# Patient Record
Sex: Female | Born: 1960
Health system: Southern US, Community
[De-identification: ages and names within clinical notes are randomized; demographics above are authoritative.]

## PROBLEM LIST (undated history)

## (undated) DIAGNOSIS — F419 Anxiety disorder, unspecified: Secondary | ICD-10-CM

## (undated) DIAGNOSIS — M889 Osteitis deformans of unspecified bone: Secondary | ICD-10-CM

## (undated) DIAGNOSIS — T4145XA Adverse effect of unspecified anesthetic, initial encounter: Secondary | ICD-10-CM

## (undated) DIAGNOSIS — F32A Depression, unspecified: Secondary | ICD-10-CM

## (undated) DIAGNOSIS — T8859XA Other complications of anesthesia, initial encounter: Secondary | ICD-10-CM

## (undated) DIAGNOSIS — Z973 Presence of spectacles and contact lenses: Secondary | ICD-10-CM

## (undated) DIAGNOSIS — K219 Gastro-esophageal reflux disease without esophagitis: Secondary | ICD-10-CM

## (undated) DIAGNOSIS — M1990 Unspecified osteoarthritis, unspecified site: Secondary | ICD-10-CM

## (undated) DIAGNOSIS — F329 Major depressive disorder, single episode, unspecified: Secondary | ICD-10-CM

## (undated) DIAGNOSIS — E785 Hyperlipidemia, unspecified: Secondary | ICD-10-CM

## (undated) HISTORY — PX: BREAST SURGERY: SHX581

## (undated) HISTORY — PX: BREAST CYST EXCISION: SHX579

## (undated) HISTORY — PX: JOINT REPLACEMENT: SHX530

## (undated) HISTORY — DX: Anxiety disorder, unspecified: F41.9

## (undated) HISTORY — DX: Depression, unspecified: F32.A

## (undated) HISTORY — PX: SPINE SURGERY: SHX786

---
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
## 1984-10-01 HISTORY — PX: DILATION AND CURETTAGE OF UTERUS: SHX78

## 1987-10-02 HISTORY — PX: TUBAL LIGATION: SHX77

## 2004-08-22 ENCOUNTER — Other Ambulatory Visit: Admission: RE | Admit: 2004-08-22 | Discharge: 2004-08-22 | Payer: Self-pay | Admitting: Family Medicine

## 2004-08-22 ENCOUNTER — Encounter (INDEPENDENT_AMBULATORY_CARE_PROVIDER_SITE_OTHER): Payer: Self-pay | Admitting: Specialist

## 2004-08-22 ENCOUNTER — Ambulatory Visit: Payer: Self-pay | Admitting: Family Medicine

## 2004-09-05 ENCOUNTER — Ambulatory Visit: Payer: Self-pay | Admitting: Obstetrics and Gynecology

## 2005-10-22 ENCOUNTER — Ambulatory Visit: Payer: Self-pay | Admitting: Family Medicine

## 2005-10-31 ENCOUNTER — Ambulatory Visit: Payer: Self-pay | Admitting: Family Medicine

## 2005-11-13 ENCOUNTER — Ambulatory Visit (HOSPITAL_COMMUNITY): Admission: RE | Admit: 2005-11-13 | Discharge: 2005-11-13 | Payer: Self-pay | Admitting: Orthopaedic Surgery

## 2008-10-11 ENCOUNTER — Emergency Department (HOSPITAL_COMMUNITY): Admission: EM | Admit: 2008-10-11 | Discharge: 2008-10-11 | Payer: Self-pay | Admitting: Emergency Medicine

## 2010-02-17 ENCOUNTER — Ambulatory Visit (HOSPITAL_COMMUNITY): Admission: RE | Admit: 2010-02-17 | Discharge: 2010-02-17 | Payer: Self-pay | Admitting: Family Medicine

## 2010-03-01 ENCOUNTER — Encounter: Payer: Self-pay | Admitting: Family Medicine

## 2010-03-01 ENCOUNTER — Ambulatory Visit (HOSPITAL_COMMUNITY): Admission: RE | Admit: 2010-03-01 | Discharge: 2010-03-01 | Payer: Self-pay | Admitting: Family Medicine

## 2010-05-01 HISTORY — PX: TOTAL ABDOMINAL HYSTERECTOMY: SHX209

## 2010-05-01 HISTORY — PX: ABDOMINAL HYSTERECTOMY: SHX81

## 2010-05-12 ENCOUNTER — Encounter (INDEPENDENT_AMBULATORY_CARE_PROVIDER_SITE_OTHER): Payer: Self-pay | Admitting: Obstetrics & Gynecology

## 2010-05-12 ENCOUNTER — Inpatient Hospital Stay (HOSPITAL_COMMUNITY): Admission: RE | Admit: 2010-05-12 | Discharge: 2010-05-14 | Payer: Self-pay | Admitting: Obstetrics & Gynecology

## 2010-06-15 ENCOUNTER — Encounter: Admission: RE | Admit: 2010-06-15 | Discharge: 2010-06-15 | Payer: Self-pay | Admitting: Obstetrics & Gynecology

## 2010-12-15 LAB — CBC
HCT: 23.4 % — ABNORMAL LOW (ref 36.0–46.0)
HCT: 25 % — ABNORMAL LOW (ref 36.0–46.0)
HCT: 37.7 % (ref 36.0–46.0)
Hemoglobin: 12.8 g/dL (ref 12.0–15.0)
Hemoglobin: 7.9 g/dL — ABNORMAL LOW (ref 12.0–15.0)
Hemoglobin: 8.4 g/dL — ABNORMAL LOW (ref 12.0–15.0)
MCH: 31.4 pg (ref 26.0–34.0)
MCH: 31.5 pg (ref 26.0–34.0)
MCH: 31.7 pg (ref 26.0–34.0)
MCHC: 33.6 g/dL (ref 30.0–36.0)
MCHC: 33.8 g/dL (ref 30.0–36.0)
MCHC: 34 g/dL (ref 30.0–36.0)
MCV: 92.8 fL (ref 78.0–100.0)
MCV: 93.4 fL (ref 78.0–100.0)
MCV: 93.8 fL (ref 78.0–100.0)
Platelets: 182 10*3/uL (ref 150–400)
Platelets: 203 10*3/uL (ref 150–400)
Platelets: 242 10*3/uL (ref 150–400)
RBC: 2.49 MIL/uL — ABNORMAL LOW (ref 3.87–5.11)
RBC: 2.68 MIL/uL — ABNORMAL LOW (ref 3.87–5.11)
RBC: 4.06 MIL/uL (ref 3.87–5.11)
RDW: 14.4 % (ref 11.5–15.5)
RDW: 14.5 % (ref 11.5–15.5)
RDW: 14.8 % (ref 11.5–15.5)
WBC: 10.5 10*3/uL (ref 4.0–10.5)
WBC: 6.9 10*3/uL (ref 4.0–10.5)
WBC: 7 10*3/uL (ref 4.0–10.5)

## 2010-12-15 LAB — SURGICAL PCR SCREEN
MRSA, PCR: NEGATIVE
Staphylococcus aureus: NEGATIVE

## 2010-12-15 LAB — PREGNANCY, URINE: Preg Test, Ur: NEGATIVE

## 2011-04-25 ENCOUNTER — Other Ambulatory Visit: Payer: Self-pay | Admitting: Nurse Practitioner

## 2011-04-25 ENCOUNTER — Other Ambulatory Visit: Payer: Self-pay | Admitting: Family Medicine

## 2011-04-25 DIAGNOSIS — M542 Cervicalgia: Secondary | ICD-10-CM

## 2011-04-27 ENCOUNTER — Ambulatory Visit (HOSPITAL_COMMUNITY)
Admission: RE | Admit: 2011-04-27 | Discharge: 2011-04-27 | Disposition: A | Discharge status: Home / Self Care | Payer: BC Managed Care – PPO | Source: Ambulatory Visit | Attending: Family Medicine | Admitting: Family Medicine

## 2011-04-27 DIAGNOSIS — M542 Cervicalgia: Secondary | ICD-10-CM

## 2011-04-27 DIAGNOSIS — M5020 Other cervical disc displacement, unspecified cervical region: Secondary | ICD-10-CM | POA: Insufficient documentation

## 2011-10-02 HISTORY — PX: CERVICAL SPINE SURGERY: SHX589

## 2011-12-21 ENCOUNTER — Other Ambulatory Visit (HOSPITAL_COMMUNITY): Payer: Self-pay | Admitting: Internal Medicine

## 2011-12-21 DIAGNOSIS — M889 Osteitis deformans of unspecified bone: Secondary | ICD-10-CM

## 2011-12-26 ENCOUNTER — Encounter (HOSPITAL_COMMUNITY)
Admission: RE | Admit: 2011-12-26 | Discharge: 2011-12-26 | Disposition: A | Discharge status: Home / Self Care | Payer: BC Managed Care – PPO | Source: Ambulatory Visit | Attending: Internal Medicine | Admitting: Internal Medicine

## 2011-12-26 DIAGNOSIS — M889 Osteitis deformans of unspecified bone: Secondary | ICD-10-CM

## 2011-12-26 MED ORDER — TECHNETIUM TC 99M MEDRONATE IV KIT
25.0000 | PACK | Freq: Once | INTRAVENOUS | Status: AC | PRN
  Administered 2011-12-26: 25 via INTRAVENOUS

## 2012-01-01 ENCOUNTER — Other Ambulatory Visit (HOSPITAL_COMMUNITY): Payer: Self-pay | Admitting: *Deleted

## 2012-01-04 ENCOUNTER — Encounter (HOSPITAL_COMMUNITY)
Admission: RE | Admit: 2012-01-04 | Discharge: 2012-01-04 | Disposition: A | Discharge status: Home / Self Care | Payer: BC Managed Care – PPO | Source: Ambulatory Visit | Attending: Internal Medicine | Admitting: Internal Medicine

## 2012-01-04 DIAGNOSIS — M889 Osteitis deformans of unspecified bone: Secondary | ICD-10-CM | POA: Insufficient documentation

## 2012-01-04 MED ORDER — ZOLEDRONIC ACID 5 MG/100ML IV SOLN
5.0000 mg | Freq: Once | INTRAVENOUS | Status: AC
  Administered 2012-01-04: 5 mg via INTRAVENOUS
  Filled 2012-01-04: qty 100

## 2012-01-09 ENCOUNTER — Ambulatory Visit: Payer: BC Managed Care – PPO | Attending: Family Medicine | Admitting: Physical Therapy

## 2012-01-09 DIAGNOSIS — R5381 Other malaise: Secondary | ICD-10-CM | POA: Insufficient documentation

## 2012-01-09 DIAGNOSIS — M25669 Stiffness of unspecified knee, not elsewhere classified: Secondary | ICD-10-CM | POA: Insufficient documentation

## 2012-01-09 DIAGNOSIS — IMO0001 Reserved for inherently not codable concepts without codable children: Secondary | ICD-10-CM | POA: Insufficient documentation

## 2012-01-09 DIAGNOSIS — M25569 Pain in unspecified knee: Secondary | ICD-10-CM | POA: Insufficient documentation

## 2012-01-10 ENCOUNTER — Ambulatory Visit: Payer: BC Managed Care – PPO | Admitting: Physical Therapy

## 2012-01-15 ENCOUNTER — Ambulatory Visit: Payer: BC Managed Care – PPO | Admitting: Physical Therapy

## 2012-01-17 ENCOUNTER — Ambulatory Visit: Payer: BC Managed Care – PPO | Admitting: Physical Therapy

## 2012-01-21 ENCOUNTER — Ambulatory Visit: Payer: BC Managed Care – PPO | Admitting: Physical Therapy

## 2012-01-23 ENCOUNTER — Ambulatory Visit: Payer: BC Managed Care – PPO | Admitting: Physical Therapy

## 2012-01-29 ENCOUNTER — Ambulatory Visit: Payer: BC Managed Care – PPO | Admitting: Physical Therapy

## 2012-01-31 ENCOUNTER — Ambulatory Visit: Payer: BC Managed Care – PPO | Attending: Family Medicine | Admitting: Physical Therapy

## 2012-01-31 DIAGNOSIS — M25669 Stiffness of unspecified knee, not elsewhere classified: Secondary | ICD-10-CM | POA: Insufficient documentation

## 2012-01-31 DIAGNOSIS — R5381 Other malaise: Secondary | ICD-10-CM | POA: Insufficient documentation

## 2012-01-31 DIAGNOSIS — M25569 Pain in unspecified knee: Secondary | ICD-10-CM | POA: Insufficient documentation

## 2012-01-31 DIAGNOSIS — IMO0001 Reserved for inherently not codable concepts without codable children: Secondary | ICD-10-CM | POA: Insufficient documentation

## 2012-02-04 ENCOUNTER — Ambulatory Visit: Payer: BC Managed Care – PPO | Admitting: Physical Therapy

## 2012-02-06 ENCOUNTER — Ambulatory Visit: Payer: BC Managed Care – PPO | Admitting: Physical Therapy

## 2012-02-11 ENCOUNTER — Ambulatory Visit: Payer: BC Managed Care – PPO | Admitting: Physical Therapy

## 2012-02-13 ENCOUNTER — Ambulatory Visit: Payer: BC Managed Care – PPO | Admitting: Physical Therapy

## 2012-02-18 ENCOUNTER — Ambulatory Visit: Payer: BC Managed Care – PPO | Admitting: Physical Therapy

## 2012-02-20 ENCOUNTER — Ambulatory Visit: Payer: BC Managed Care – PPO | Admitting: Physical Therapy

## 2012-02-28 ENCOUNTER — Ambulatory Visit: Payer: BC Managed Care – PPO | Admitting: Physical Therapy

## 2012-03-03 ENCOUNTER — Ambulatory Visit: Payer: BC Managed Care – PPO | Attending: Family Medicine | Admitting: Physical Therapy

## 2012-03-03 DIAGNOSIS — IMO0001 Reserved for inherently not codable concepts without codable children: Secondary | ICD-10-CM | POA: Insufficient documentation

## 2012-03-03 DIAGNOSIS — R5381 Other malaise: Secondary | ICD-10-CM | POA: Insufficient documentation

## 2012-03-03 DIAGNOSIS — M25569 Pain in unspecified knee: Secondary | ICD-10-CM | POA: Insufficient documentation

## 2012-03-03 DIAGNOSIS — M25669 Stiffness of unspecified knee, not elsewhere classified: Secondary | ICD-10-CM | POA: Insufficient documentation

## 2012-03-06 ENCOUNTER — Ambulatory Visit: Payer: BC Managed Care – PPO | Admitting: Physical Therapy

## 2012-03-11 ENCOUNTER — Ambulatory Visit: Payer: BC Managed Care – PPO | Admitting: *Deleted

## 2012-03-13 ENCOUNTER — Ambulatory Visit: Payer: BC Managed Care – PPO | Admitting: Physical Therapy

## 2012-03-18 ENCOUNTER — Ambulatory Visit: Payer: BC Managed Care – PPO | Admitting: *Deleted

## 2012-03-26 ENCOUNTER — Ambulatory Visit: Payer: BC Managed Care – PPO | Admitting: Physical Therapy

## 2012-03-27 ENCOUNTER — Ambulatory Visit: Payer: BC Managed Care – PPO | Admitting: Physical Therapy

## 2012-10-22 ENCOUNTER — Ambulatory Visit: Payer: 59 | Attending: Family Medicine | Admitting: Physical Therapy

## 2012-10-22 DIAGNOSIS — M25569 Pain in unspecified knee: Secondary | ICD-10-CM | POA: Insufficient documentation

## 2012-10-22 DIAGNOSIS — M25519 Pain in unspecified shoulder: Secondary | ICD-10-CM | POA: Insufficient documentation

## 2012-10-22 DIAGNOSIS — R5381 Other malaise: Secondary | ICD-10-CM | POA: Insufficient documentation

## 2012-10-22 DIAGNOSIS — IMO0001 Reserved for inherently not codable concepts without codable children: Secondary | ICD-10-CM | POA: Insufficient documentation

## 2012-10-27 ENCOUNTER — Ambulatory Visit: Payer: 59 | Admitting: *Deleted

## 2012-10-30 ENCOUNTER — Encounter: Payer: BC Managed Care – PPO | Admitting: Physical Therapy

## 2012-11-05 ENCOUNTER — Ambulatory Visit: Payer: 59 | Attending: Family Medicine | Admitting: Physical Therapy

## 2012-11-05 DIAGNOSIS — M25519 Pain in unspecified shoulder: Secondary | ICD-10-CM | POA: Insufficient documentation

## 2012-11-05 DIAGNOSIS — R5381 Other malaise: Secondary | ICD-10-CM | POA: Insufficient documentation

## 2012-11-05 DIAGNOSIS — M25569 Pain in unspecified knee: Secondary | ICD-10-CM | POA: Insufficient documentation

## 2012-11-05 DIAGNOSIS — IMO0001 Reserved for inherently not codable concepts without codable children: Secondary | ICD-10-CM | POA: Insufficient documentation

## 2012-11-06 ENCOUNTER — Encounter: Payer: BC Managed Care – PPO | Admitting: Physical Therapy

## 2012-11-11 ENCOUNTER — Ambulatory Visit: Payer: 59 | Admitting: Physical Therapy

## 2012-11-12 ENCOUNTER — Ambulatory Visit: Payer: 59 | Admitting: Physical Therapy

## 2012-11-18 ENCOUNTER — Ambulatory Visit: Payer: 59 | Admitting: Physical Therapy

## 2012-11-20 ENCOUNTER — Ambulatory Visit: Payer: 59 | Admitting: Physical Therapy

## 2012-11-25 ENCOUNTER — Encounter: Payer: BC Managed Care – PPO | Admitting: Physical Therapy

## 2012-11-28 ENCOUNTER — Encounter: Payer: BC Managed Care – PPO | Admitting: Physical Therapy

## 2013-01-02 ENCOUNTER — Ambulatory Visit (INDEPENDENT_AMBULATORY_CARE_PROVIDER_SITE_OTHER): Payer: 59 | Admitting: General Practice

## 2013-01-02 ENCOUNTER — Telehealth: Payer: Self-pay | Admitting: Nurse Practitioner

## 2013-01-02 ENCOUNTER — Encounter: Payer: Self-pay | Admitting: General Practice

## 2013-01-02 VITALS — BP 118/78 | HR 72 | Temp 99.2°F | Ht 61.0 in | Wt 138.0 lb

## 2013-01-02 DIAGNOSIS — R5383 Other fatigue: Secondary | ICD-10-CM

## 2013-01-02 DIAGNOSIS — R5381 Other malaise: Secondary | ICD-10-CM

## 2013-01-02 DIAGNOSIS — K589 Irritable bowel syndrome without diarrhea: Secondary | ICD-10-CM

## 2013-01-02 DIAGNOSIS — K625 Hemorrhage of anus and rectum: Secondary | ICD-10-CM

## 2013-01-02 DIAGNOSIS — R197 Diarrhea, unspecified: Secondary | ICD-10-CM

## 2013-01-02 LAB — COMPLETE METABOLIC PANEL WITH GFR
ALT: 9 U/L (ref 0–35)
AST: 13 U/L (ref 0–37)
Albumin: 4.1 g/dL (ref 3.5–5.2)
Alkaline Phosphatase: 33 U/L — ABNORMAL LOW (ref 39–117)
BUN: 12 mg/dL (ref 6–23)
CO2: 25 mEq/L (ref 19–32)
Calcium: 9.1 mg/dL (ref 8.4–10.5)
Chloride: 107 mEq/L (ref 96–112)
Creat: 0.74 mg/dL (ref 0.50–1.10)
GFR, Est African American: >89 mL/min
GFR, Est Non African American: >89 mL/min
Glucose, Bld: 93 mg/dL (ref 70–99)
Potassium: 4 mEq/L (ref 3.5–5.3)
Sodium: 138 mEq/L (ref 135–145)
Total Bilirubin: 0.4 mg/dL (ref 0.3–1.2)
Total Protein: 6.5 g/dL (ref 6.0–8.3)

## 2013-01-02 LAB — POCT CBC
Granulocyte percent: 80.5 %G — AB (ref 37–80)
HCT, POC: 39.7 % (ref 37.7–47.9)
Hemoglobin: 13.2 g/dL (ref 12.2–16.2)
Lymph, poc: 1.5 (ref 0.6–3.4)
MCH, POC: 30.9 pg (ref 27–31.2)
MCHC: 33.2 g/dL (ref 31.8–35.4)
MCV: 93.1 fL (ref 80–97)
MPV: 80 fL (ref 0–99.8)
POC Granulocyte: 8.2 — AB (ref 2–6.9)
POC LYMPH PERCENT: 14.5 %L (ref 10–50)
Platelet Count, POC: 217 10*3/uL (ref 142–424)
RBC: 4.3 M/uL (ref 4.04–5.48)
RDW, POC: 12.8 %
WBC: 10.2 10*3/uL (ref 4.6–10.2)

## 2013-01-02 LAB — LIPID PANEL
Cholesterol: 169 mg/dL (ref 0–200)
HDL: 53 mg/dL (ref >39)
LDL Cholesterol: 104 mg/dL — ABNORMAL HIGH (ref 0–99)
Total CHOL/HDL Ratio: 3.2 Ratio
Triglycerides: 61 mg/dL (ref <150)
VLDL: 12 mg/dL (ref 0–40)

## 2013-01-02 NOTE — Patient Instructions (Addendum)
Irritable Bowel Syndrome Irritable Bowel Syndrome (IBS) is caused by a disturbance of normal bowel function. Other terms used are spastic colon, mucous colitis, and irritable colon. It does not require surgery, nor does it lead to cancer. There is no cure for IBS. But with proper diet, stress reduction, and medication, you will find that your problems (symptoms) will gradually disappear or improve. IBS is a common digestive disorder. It usually appears in late adolescence or early adulthood. Women develop it twice as often as men. CAUSES  After food has been digested and absorbed in the small intestine, waste material is moved into the colon (large intestine). In the colon, water and salts are absorbed from the undigested products coming from the small intestine. The remaining residue, or fecal material, is held for elimination. Under normal circumstances, gentle, rhythmic contractions on the bowel walls push the fecal material along the colon towards the rectum. In IBS, however, these contractions are irregular and poorly coordinated. The fecal material is either retained too long, resulting in constipation, or expelled too soon, producing diarrhea. SYMPTOMS  The most common symptom of IBS is pain. It is typically in the lower left side of the belly (abdomen). But it may occur anywhere in the abdomen. It can be felt as heartburn, backache, or even as a dull pain in the arms or shoulders. The pain comes from excessive bowel-muscle spasms and from the buildup of gas and fecal material in the colon. This pain:  Can range from sharp belly (abdominal) cramps to a dull, continuous ache.  Usually worsens soon after eating.  Is typically relieved by having a bowel movement or passing gas. Abdominal pain is usually accompanied by constipation. But it may also produce diarrhea. The diarrhea typically occurs right after a meal or upon arising in the morning. The stools are typically soft and watery. They are often  flecked with secretions (mucus). Other symptoms of IBS include:  Bloating.  Loss of appetite.  Heartburn.  Feeling sick to your stomach (nausea).  Belching  Vomiting  Gas. IBS may also cause a number of symptoms that are unrelated to the digestive system:  Fatigue.  Headaches.  Anxiety  Shortness of breath  Difficulty in concentrating.  Dizziness. These symptoms tend to come and go. DIAGNOSIS  The symptoms of IBS closely mimic the symptoms of other, more serious digestive disorders. So your caregiver may wish to perform a variety of additional tests to exclude these disorders. He/she wants to be certain of learning what is wrong (diagnosis). The nature and purpose of each test will be explained to you. TREATMENT A number of medications are available to help correct bowel function and/or relieve bowel spasms and abdominal pain. Among the drugs available are:  Mild, non-irritating laxatives for severe constipation and to help restore normal bowel habits.  Specific anti-diarrheal medications to treat severe or prolonged diarrhea.  Anti-spasmodic agents to relieve intestinal cramps.  Your caregiver may also decide to treat you with a mild tranquilizer or sedative during unusually stressful periods in your life. The important thing to remember is that if any drug is prescribed for you, make sure that you take it exactly as directed. Make sure that your caregiver knows how well it worked for you. HOME CARE INSTRUCTIONS   Avoid foods that are high in fat or oils. Some examples JJO:ACZYS cream, butter, frankfurters, sausage, and other fatty meats.  Avoid foods that have a laxative effect, such as fruit, fruit juice, and dairy products.  Cut out  carbonated drinks, chewing gum, and "gassy" foods, such as beans and cabbage. This may help relieve bloating and belching.  Bran taken with plenty of liquids may help relieve constipation.  Keep track of what foods seem to trigger  your symptoms.  Avoid emotionally charged situations or circumstances that produce anxiety.  Start or continue exercising.  Get plenty of rest and sleep. MAKE SURE YOU:   Understand these instructions.  Will watch your condition.  Will get help right away if you are not doing well or get worse. Document Released: 09/17/2005 Document Revised: 12/10/2011 Document Reviewed: 05/07/2008 [facility] Patient Information 2013 [place], [place]. Diarrhea Infections caused by germs (bacterial) or a virus commonly cause diarrhea. Your caregiver has determined that with time, rest and fluids, the diarrhea should improve. In general, eat normally while drinking more water than usual. Although water may prevent dehydration, it does not contain salt and minerals (electrolytes). Broths, weak tea without caffeine and oral rehydration solutions (ORS) replace fluids and electrolytes. Small amounts of fluids should be taken frequently. Large amounts at one time may not be tolerated. Plain water may be harmful in infants and the elderly. Oral rehydrating solutions (ORS) are available at pharmacies and grocery stores. ORS replace water and important electrolytes in proper proportions. Sports drinks are not as effective as ORS and may be harmful due to sugars worsening diarrhea.  ORS is especially recommended for use in children with diarrhea. As a general guideline for children, replace any new fluid losses from diarrhea and/or vomiting with ORS as follows:  If your child weighs 22 pounds or under (10 kg or less), give 60-120 mL ( -  cup or 2 - 4 ounces) of ORS for each episode of diarrheal stool or vomiting episode.  If your child weighs more than 22 pounds (more than 10 kgs), give 120-240 mL ( - 1 cup or 4 - 8 ounces) of ORS for each diarrheal stool or episode of vomiting.  While correcting for dehydration, children should eat normally. However, foods high in sugar should be avoided because this may worsen  diarrhea. Large amounts of carbonated soft drinks, juice, gelatin desserts and other highly sugared drinks should be avoided.  After correction of dehydration, other liquids that are appealing to the child may be added. Children should drink small amounts of fluids frequently and fluids should be increased as tolerated. Children should drink enough fluids to keep urine clear or pale yellow.  Adults should eat normally while drinking more fluids than usual. Drink small amounts of fluids frequently and increase as tolerated. Drink enough fluids to keep urine clear or pale yellow. Broths, weak decaffeinated tea, lemon lime soft drinks (allowed to go flat) and ORS replace fluids and electrolytes.  Avoid:  Carbonated drinks.  Juice.  Extremely hot or cold fluids.  Caffeine drinks.  Fatty, greasy foods.  Alcohol.  Tobacco.  Too much intake of anything at one time.  Gelatin desserts.  Probiotics are active cultures of beneficial bacteria. They may lessen the amount and number of diarrheal stools in adults. Probiotics can be found in yogurt with active cultures and in supplements.  Wash hands well to avoid spreading bacteria and virus.  Anti-diarrheal medications are not recommended for infants and children.  Only take over-the-counter or prescription medicines for pain, discomfort or fever as directed by your caregiver. Do not give aspirin to children because it may cause Reye's Syndrome.  For adults, ask your caregiver if you should continue all prescribed and over-the-counter medicines.  If your caregiver has given you a follow-up appointment, it is very important to keep that appointment. Not keeping the appointment could result in a chronic or permanent injury, and disability. If there is any problem keeping the appointment, you must call back to this facility for assistance. SEEK IMMEDIATE MEDICAL CARE IF:   You or your child is unable to keep fluids down or other symptoms or  problems become worse in spite of treatment.  Vomiting or diarrhea develops and becomes persistent.  There is vomiting of blood or bile (green material).  There is blood in the stool or the stools are black and tarry.  There is no urine output in 6-8 hours or there is only a small amount of very dark urine.  Abdominal pain develops, increases or localizes.  You have a fever.  Your baby is older than 3 months with a rectal temperature of 102 F (38.9 C) or higher.  Your baby is 25 months old or younger with a rectal temperature of 100.4 F (38 C) or higher.  You or your child develops excessive weakness, dizziness, fainting or extreme thirst.  You or your child develops a rash, stiff neck, severe headache or become irritable or sleepy and difficult to awaken. MAKE SURE YOU:   Understand these instructions.  Will watch your condition.  Will get help right away if you are not doing well or get worse. Document Released: 09/07/2002 Document Revised: 12/10/2011 Document Reviewed: 07/25/2009 [facility] Patient Information 2013 [place], [place]. Bloody Stools Bloody stools often mean that there is a problem in the digestive tract. Your caregiver may use the term "melena" to describe black, tarry, and bad smelling stools or "hematochezia" to describe red or maroon-colored stools. Blood seen in the stool can be caused by bleeding anywhere along the intestinal tract.  A black stool usually means that blood is coming from the upper part of the gastrointestinal tract (esophagus, stomach, or small bowel). Passing maroon-colored stools or bright red blood usually means that blood is coming from lower down in the large bowel or the rectum. However, sometimes massive bleeding in the stomach or small intestine can cause bright red bloody stools.  Consuming black licorice, lead, iron pills, medicines containing bismuth subsalicylate, or blueberries can also cause black stools. Your caregiver can test  black stools to see if blood is present. It is important that the cause of the bleeding be found. Treatment can then be started, and the problem can be corrected. Rectal bleeding may not be serious, but you should not assume everything is okay until you know the cause.It is very important to follow up with your caregiver or a specialist in gastrointestinal problems. CAUSES  Blood in the stools can come from various underlying causes.Often, the cause is not found during your first visit. Testing is often needed to discover the cause of bleeding in the gastrointestinal tract. Causes range from simple to serious or even life-threatening.Possible causes include:  Hemorrhoids.These are veins that are full of blood (engorged) in the rectum. They cause pain, inflammation, and may bleed.  Anal fissures.These are areas of painful tearing which may bleed. They are often caused by passing hard stool.  Diverticulosis.These are pouches that form on the colon over time, with age, and may bleed significantly.  Diverticulitis.This is inflammation in areas with diverticulosis. It can cause pain, fever, and bloody stools, although bleeding is rare.  Proctitis and colitis. These are inflamed areas of the rectum or colon. They may cause pain, fever, and  bloody stools.  Polyps and cancer. Colon cancer is a leading cause of preventable cancer death.It often starts out as precancerous polyps that can be removed during a colonoscopy, preventing progression into cancer. Sometimes, polyps and cancer may cause rectal bleeding.  Gastritis and ulcers.Bleeding from the upper gastrointestinal tract (near the stomach) may travel through the intestines and produce black, sometimes tarry, often bad smelling stools. In certain cases, if the bleeding is fast enough, the stools may not be black, but red and the condition may be life-threatening. SYMPTOMS  You may have stools that are bright red and bloody, that are normal  color with blood on them, or that are dark black and tarry. In some cases, you may only have blood in the toilet bowl. Any of these cases need medical care. You may also have:  Pain at the anus or anywhere in the rectum.  Lightheadedness or feeling faint.  Extreme weakness.  Nausea or vomiting.  Fever. DIAGNOSIS Your caregiver may use the following methods to find the cause of your bleeding:  Taking a medical history. Age is important. Older people tend to develop polyps and cancer more often. If there is anal pain and a hard, large stool associated with bleeding, a tear of the anus may be the cause. If blood drips into the toilet after a bowel movement, bleeding hemorrhoids may be the problem. The color and frequency of the bleeding are additional considerations. In most cases, the medical history provides clues, but seldom the final answer.  A visual and finger (digital) exam. Your caregiver will inspect the anal area, looking for tears and hemorrhoids. A finger exam can provide information when there is tenderness or a growth inside. In men, the prostate is also examined.  Endoscopy. Several types of small, long scopes (endoscopes) are used to view the colon.  In the office, your caregiver may use a rigid, or more commonly, a flexible viewing sigmoidoscope. This exam is called flexible sigmoidoscopy. It is performed in 5 to 10 minutes.  A more thorough exam is accomplished with a colonoscope. It allows your caregiver to view the entire 5 to 6 foot long colon. Medicine to help you relax (sedative) is usually given for this exam. Frequently, a bleeding lesion may be present beyond the reach of the sigmoidoscope. So, a colonoscopy may be the best exam to start with. Both exams are usually done on an outpatient basis. This means the patient does not stay overnight in the hospital or surgery center.  An upper endoscopy may be needed to examine your stomach. Sedation is used and a flexible  endoscope is put in your mouth, down to your stomach.  A barium enema X-ray. This is an X-ray exam. It uses liquid barium inserted by enema into the rectum. This test alone may not identify an actual bleeding point. X-rays highlight abnormal shadows, such as those made by lumps (tumors), diverticuli, or colitis. TREATMENT  Treatment depends on the cause of your bleeding.   For bleeding from the stomach or colon, the caregiver doing your endoscopy or colonoscopy may be able to stop the bleeding as part of the procedure.  Inflammation or infection of the colon can be treated with medicines.  Many rectal problems can be treated with creams, suppositories, or warm baths.  Surgery is sometimes needed.  Blood transfusions are sometimes needed if you have lost a lot of blood.  For any bleeding problem, let your caregiver know if you take aspirin or other blood thinners regularly.  HOME CARE INSTRUCTIONS   Take any medicines exactly as prescribed.  Keep your stools soft by eating a diet high in fiber. Prunes (1 to 3 a day) work well for many people.  Drink enough water and fluids to keep your urine clear or pale yellow.  Take sitz baths if advised. A sitz bath is when you sit in a bathtub with warm water for 10 to 15 minutes to soak, soothe, and cleanse the rectal area.  If enemas or suppositories are advised, be sure you know how to use them. Tell your caregiver if you have problems with this.  Monitor your bowel movements to look for signs of improvement or worsening. SEEK MEDICAL CARE IF:   You do not improve in the time expected.  Your condition worsens after initial improvement.  You develop any new symptoms. SEEK IMMEDIATE MEDICAL CARE IF:   You develop severe or prolonged rectal bleeding.  You vomit blood.  You feel weak or faint.  You have a fever. MAKE SURE YOU:  Understand these instructions.  Will watch your condition.  Will get help right away if you are not  doing well or get worse. Document Released: 09/07/2002 Document Revised: 12/10/2011 Document Reviewed: 02/02/2011 [facility] Patient Information 2013 [place], [place].

## 2013-01-02 NOTE — Telephone Encounter (Signed)
PT ON HOLD AT [facility]- SHE WILL PAY REMAINING BALANCE OF $51 TODAY AND APPT MADE WITH [facility].

## 2013-01-02 NOTE — Progress Notes (Signed)
  Subjective:    Patient ID: Julia [name], female    DOB: [birth year], 52 y.o.   MRN: [id number]  Diarrhea  This is a new problem. The current episode started more than 1 month ago (1 and 1/2 months). The problem occurs 5 to 10 times per day. The problem has been gradually worsening. The stool consistency is described as bloody. The patient states that diarrhea awakens her from sleep. Associated symptoms include increased flatus. Pertinent negatives include no abdominal pain, bloating, chills, fever or headaches. Nothing aggravates the symptoms. She has tried nothing for the symptoms. There is no history of bowel resection or a recent abdominal surgery.  lost 20 pounds in 2 months Patient reports bloody stools twice in past week. Denies any noticeable blood in past couple days. Reports taking meloxicam 15mg  once daily, since January 2014 for knee and shoulder pain.     Review of Systems  Constitutional: Positive for appetite change. Negative for fever, chills and activity change.       Doesn't consume as much, increase urge to have bowel movement  Respiratory: Negative for chest tightness and shortness of breath.   Cardiovascular: Negative for chest pain and palpitations.  Gastrointestinal: Positive for diarrhea, blood in stool and flatus. Negative for abdominal pain, abdominal distention, rectal pain and bloating.  Genitourinary: Negative for hematuria, difficulty urinating and pelvic pain.  Musculoskeletal: Positive for joint swelling.       Knee and shoulder   Skin: Negative.   Neurological: Negative for headaches.  Psychiatric/Behavioral: Negative.        Objective:   Physical Exam  Constitutional: She is oriented to person, place, and time. She appears well-developed and well-nourished.  Cardiovascular: Normal rate, regular rhythm and normal heart sounds.   No murmur heard. Pulmonary/Chest: Effort normal and breath sounds normal.  Abdominal: Soft. Bowel sounds are normal. She  exhibits no distension. There is no tenderness. There is no rebound.  Neurological: She is alert and oriented to person, place, and time.  Skin: Skin is warm and dry.  Psychiatric: She has a normal mood and affect.         Assessment & Plan:  Discontinue Meloxicam Referral to gastroenterology Labs pending Discussed dehydration Increase fluid intake Go to emergency room if have bloody stool  Raymon Mutton, FNP-C

## 2013-01-03 LAB — VITAMIN D 25 HYDROXY (VIT D DEFICIENCY, FRACTURES): Vit D, 25-Hydroxy: 32 ng/mL (ref 30–89)

## 2013-01-14 ENCOUNTER — Other Ambulatory Visit (HOSPITAL_COMMUNITY): Payer: Self-pay | Admitting: Internal Medicine

## 2013-01-14 DIAGNOSIS — M889 Osteitis deformans of unspecified bone: Secondary | ICD-10-CM

## 2013-01-20 ENCOUNTER — Encounter (HOSPITAL_COMMUNITY): Payer: 59

## 2013-01-20 ENCOUNTER — Other Ambulatory Visit: Payer: Self-pay | Admitting: Gastroenterology

## 2013-01-29 ENCOUNTER — Encounter (HOSPITAL_COMMUNITY)
Admission: RE | Admit: 2013-01-29 | Discharge: 2013-01-29 | Disposition: A | Discharge status: Home / Self Care | Payer: 59 | Source: Ambulatory Visit | Attending: Internal Medicine | Admitting: Internal Medicine

## 2013-01-29 DIAGNOSIS — M25569 Pain in unspecified knee: Secondary | ICD-10-CM | POA: Insufficient documentation

## 2013-01-29 DIAGNOSIS — M889 Osteitis deformans of unspecified bone: Secondary | ICD-10-CM

## 2013-01-29 MED ORDER — TECHNETIUM TC 99M MEDRONATE IV KIT
25.0000 | PACK | Freq: Once | INTRAVENOUS | Status: AC | PRN
  Administered 2013-01-29: 25 via INTRAVENOUS

## 2013-07-17 ENCOUNTER — Other Ambulatory Visit: Payer: Self-pay | Admitting: Orthopedic Surgery

## 2013-07-17 ENCOUNTER — Encounter (HOSPITAL_BASED_OUTPATIENT_CLINIC_OR_DEPARTMENT_OTHER): Payer: Self-pay | Admitting: *Deleted

## 2013-07-17 NOTE — Progress Notes (Signed)
No labs needed

## 2013-07-20 NOTE — H&P (Signed)
Julia [name] is an 52 y.o. female.   Chief Complaint: Left knee Pes anserine ganglion HPI:  Patient is seen in consultation from Dr. Althea Charon for a complex ganglion over the left knee peds anserine region.  This is been confirmed by MRI scan which also shows Paget's disease of the distal femur and that is stable.  In addition, the patient has some pain along the medial joint line with occasional catching and may have an occult meniscal or chondral lesion not seen on the MRI scan.  She is had the complex ganglion for well over a year and a half.  It is painful and she would like to have it removed.  At the same time she would like to have arthroscopy of her knee to see if she has any chondral or meniscal lesions.  Past Medical History  Diagnosis Date  . Paget disease of bone   . Wears glasses   . Arthritis   . Complication of anesthesia     slow to wake up-does not take much medicine to sedate her    Past Surgical History  Procedure Laterality Date  . Cervical spine surgery  1/13  . Abdominal hysterectomy  8/11    [facility]  . Dilation and curettage of uterus  1986  . Tubal ligation  1989    Family History  Problem Relation Age of Onset  . Thyroid disease Mother   . Congestive Heart Failure Mother   . Heart disease Mother   . Cancer Sister   . Healthy Brother   . Healthy Brother   . Healthy Brother   . Healthy Brother    Social History:  reports that she has quit smoking. She quit smokeless tobacco use about 28 years ago. She reports that she drinks alcohol. She reports that she does not use illicit drugs.  Allergies: No Known Allergies  No prescriptions prior to admission    No results found for this or any previous visit (from the past 48 hour(s)). No results found.  Review of Systems  Constitutional: Negative.   HENT: Negative.   Eyes: Negative.   Respiratory: Negative.   Cardiovascular: Negative.   Gastrointestinal: Negative.   Musculoskeletal: Positive for joint  pain.  Skin: Negative.   Neurological: Negative.   Endo/Heme/Allergies: Negative.   Psychiatric/Behavioral: Negative.     Height 5\' 1"  (1.549 m), weight 62.143 kg (137 lb), last menstrual period 01/02/2010. Physical Exam  Constitutional: She is oriented to person, place, and time. She appears well-developed and well-nourished.  HENT:  Head: Normocephalic and atraumatic.  Eyes: Pupils are equal, round, and reactive to light.  Neck: Normal range of motion. Neck supple.  Cardiovascular: Intact distal pulses.   Respiratory: Effort normal.  Musculoskeletal: She exhibits tenderness.  She does have an area of swelling and tenderness over the left pes anserine bursa, it is concordant with the findings on the MRI scan showing a multiloculated complex ganglion over the peds bursa that is 3-4 cm in length, 2-3 cm in width and 2 cm in depth.  I cannot detect any overt chondral lesions of the distal femur, proximal tibia where she is tender but that is certainly possible she does have a full range of motion of her left knee, Lachman's and pivot shift tests are negative collateral ligaments are stable quadriceps power is moderate.  Neurological: She is alert and oriented to person, place, and time. She has normal reflexes.  Skin: Skin is warm and dry.  Psychiatric: She has a  normal mood and affect. Her behavior is normal. Judgment and thought content normal.     Assessment/Plan Assess: 1.  Painful complex multiloculated peds anserine ganglion, left knee.  Possible occult meniscal or chondral lesion. 2.  Stable Paget's disease, left distal femur  Plan:.  The risks and benefits of open excision of the ganglion were discussed with the patient and she would like to proceed and at the same time would like to have arthroscopic evaluation treatment of the left knee accomplished under the anesthetic.  We will get this arranged at her convenience.  She has a job where she stands most of the time working at [employer]  and may be out of work for anywhere from 2-4 weeks unless there is light duty available.  Christinia Lambeth R 07/20/2013, 3:07 PM

## 2013-07-22 ENCOUNTER — Encounter (HOSPITAL_BASED_OUTPATIENT_CLINIC_OR_DEPARTMENT_OTHER): Payer: 59 | Admitting: Anesthesiology

## 2013-07-22 ENCOUNTER — Ambulatory Visit (HOSPITAL_BASED_OUTPATIENT_CLINIC_OR_DEPARTMENT_OTHER)
Admission: RE | Admit: 2013-07-22 | Discharge: 2013-07-22 | Disposition: A | Discharge status: Home / Self Care | Payer: 59 | Source: Ambulatory Visit | Attending: Orthopedic Surgery | Admitting: Orthopedic Surgery

## 2013-07-22 ENCOUNTER — Encounter (HOSPITAL_BASED_OUTPATIENT_CLINIC_OR_DEPARTMENT_OTHER): Payer: Self-pay | Admitting: *Deleted

## 2013-07-22 ENCOUNTER — Encounter (HOSPITAL_BASED_OUTPATIENT_CLINIC_OR_DEPARTMENT_OTHER)
Admission: RE | Disposition: A | Discharge status: Home / Self Care | Payer: Self-pay | Source: Ambulatory Visit | Attending: Orthopedic Surgery

## 2013-07-22 ENCOUNTER — Ambulatory Visit (HOSPITAL_BASED_OUTPATIENT_CLINIC_OR_DEPARTMENT_OTHER): Payer: 59 | Admitting: Anesthesiology

## 2013-07-22 DIAGNOSIS — M889 Osteitis deformans of unspecified bone: Secondary | ICD-10-CM | POA: Insufficient documentation

## 2013-07-22 DIAGNOSIS — M2240 Chondromalacia patellae, unspecified knee: Secondary | ICD-10-CM | POA: Insufficient documentation

## 2013-07-22 DIAGNOSIS — M7050 Other bursitis of knee, unspecified knee: Secondary | ICD-10-CM

## 2013-07-22 DIAGNOSIS — M129 Arthropathy, unspecified: Secondary | ICD-10-CM | POA: Insufficient documentation

## 2013-07-22 DIAGNOSIS — M23329 Other meniscus derangements, posterior horn of medial meniscus, unspecified knee: Secondary | ICD-10-CM | POA: Insufficient documentation

## 2013-07-22 DIAGNOSIS — Z87891 Personal history of nicotine dependence: Secondary | ICD-10-CM | POA: Insufficient documentation

## 2013-07-22 DIAGNOSIS — M2340 Loose body in knee, unspecified knee: Secondary | ICD-10-CM | POA: Insufficient documentation

## 2013-07-22 DIAGNOSIS — M6740 Ganglion, unspecified site: Secondary | ICD-10-CM | POA: Insufficient documentation

## 2013-07-22 DIAGNOSIS — S83207A Unspecified tear of unspecified meniscus, current injury, left knee, initial encounter: Secondary | ICD-10-CM

## 2013-07-22 HISTORY — DX: Adverse effect of unspecified anesthetic, initial encounter: T41.45XA

## 2013-07-22 HISTORY — DX: Osteitis deformans of unspecified bone: M88.9

## 2013-07-22 HISTORY — DX: Other complications of anesthesia, initial encounter: T88.59XA

## 2013-07-22 HISTORY — DX: Presence of spectacles and contact lenses: Z97.3

## 2013-07-22 HISTORY — PX: KNEE ARTHROSCOPY: SHX127

## 2013-07-22 HISTORY — DX: Unspecified osteoarthritis, unspecified site: M19.90

## 2013-07-22 SURGERY — ARTHROSCOPY, KNEE
Anesthesia: General | Site: Knee | Laterality: Left | Wound class: Clean

## 2013-07-22 MED ORDER — ONDANSETRON HCL 4 MG/2ML IJ SOLN: 4.0000 mg | Freq: Four times a day (QID) | INTRAMUSCULAR | Status: DC | PRN

## 2013-07-22 MED ORDER — CEFAZOLIN SODIUM-DEXTROSE 2-3 GM-% IV SOLR
2.0000 g | INTRAVENOUS | Status: AC
  Administered 2013-07-22: 2 g via INTRAVENOUS

## 2013-07-22 MED ORDER — LACTATED RINGERS IV SOLN
INTRAVENOUS | Status: DC
  Administered 2013-07-22 (×2): via INTRAVENOUS

## 2013-07-22 MED ORDER — BUPIVACAINE-EPINEPHRINE 0.5% -1:200000 IJ SOLN
INTRAMUSCULAR | Status: DC | PRN
  Administered 2013-07-22: 20 mL

## 2013-07-22 MED ORDER — ONDANSETRON HCL 4 MG/2ML IJ SOLN
INTRAMUSCULAR | Status: DC | PRN
  Administered 2013-07-22: 4 mg via INTRAVENOUS

## 2013-07-22 MED ORDER — FENTANYL CITRATE 0.05 MG/ML IJ SOLN: 50.0000 ug | Freq: Once | INTRAMUSCULAR | Status: DC

## 2013-07-22 MED ORDER — OXYCODONE HCL 5 MG PO TABS
ORAL_TABLET | ORAL | Status: AC
  Filled 2013-07-22: qty 1

## 2013-07-22 MED ORDER — FENTANYL CITRATE 0.05 MG/ML IJ SOLN
INTRAMUSCULAR | Status: DC | PRN
  Administered 2013-07-22: 50 ug via INTRAVENOUS
  Administered 2013-07-22: 25 ug via INTRAVENOUS

## 2013-07-22 MED ORDER — BUPIVACAINE-EPINEPHRINE PF 0.5-1:200000 % IJ SOLN
INTRAMUSCULAR | Status: AC
  Filled 2013-07-22: qty 30

## 2013-07-22 MED ORDER — MIDAZOLAM HCL 2 MG/2ML IJ SOLN: 1.0000 mg | INTRAMUSCULAR | Status: DC | PRN

## 2013-07-22 MED ORDER — DEXTROSE-NACL 5-0.45 % IV SOLN: INTRAVENOUS | Status: DC

## 2013-07-22 MED ORDER — ONDANSETRON HCL 4 MG PO TABS: 4.0000 mg | ORAL_TABLET | Freq: Four times a day (QID) | ORAL | Status: DC | PRN

## 2013-07-22 MED ORDER — DEXAMETHASONE SODIUM PHOSPHATE 10 MG/ML IJ SOLN
INTRAMUSCULAR | Status: DC | PRN
  Administered 2013-07-22: 10 mg via INTRAVENOUS

## 2013-07-22 MED ORDER — PROPOFOL 10 MG/ML IV BOLUS
INTRAVENOUS | Status: DC | PRN
  Administered 2013-07-22: 120 mg via INTRAVENOUS

## 2013-07-22 MED ORDER — CEFAZOLIN SODIUM-DEXTROSE 2-3 GM-% IV SOLR
INTRAVENOUS | Status: AC
  Filled 2013-07-22: qty 50

## 2013-07-22 MED ORDER — SODIUM CHLORIDE 0.9 % IR SOLN
Status: DC | PRN
  Administered 2013-07-22: 12:00:00

## 2013-07-22 MED ORDER — PROMETHAZINE HCL 25 MG/ML IJ SOLN: 6.2500 mg | INTRAMUSCULAR | Status: DC | PRN

## 2013-07-22 MED ORDER — OXYCODONE HCL 5 MG PO TABS
5.0000 mg | ORAL_TABLET | Freq: Once | ORAL | Status: AC | PRN
  Administered 2013-07-22: 5 mg via ORAL

## 2013-07-22 MED ORDER — MIDAZOLAM HCL 5 MG/5ML IJ SOLN
INTRAMUSCULAR | Status: DC | PRN
  Administered 2013-07-22: 1 mg via INTRAVENOUS

## 2013-07-22 MED ORDER — HYDROCODONE-ACETAMINOPHEN 5-325 MG PO TABS: 1.0000 | ORAL_TABLET | Freq: Four times a day (QID) | ORAL | Status: DC | PRN

## 2013-07-22 MED ORDER — FENTANYL CITRATE 0.05 MG/ML IJ SOLN
INTRAMUSCULAR | Status: AC
  Filled 2013-07-22: qty 6

## 2013-07-22 MED ORDER — CHLORHEXIDINE GLUCONATE 4 % EX LIQD: 60.0000 mL | Freq: Once | CUTANEOUS | Status: DC

## 2013-07-22 MED ORDER — HYDROMORPHONE HCL PF 1 MG/ML IJ SOLN
INTRAMUSCULAR | Status: AC
  Filled 2013-07-22: qty 1

## 2013-07-22 MED ORDER — OXYCODONE HCL 5 MG/5ML PO SOLN: 5.0000 mg | Freq: Once | ORAL | Status: AC | PRN

## 2013-07-22 MED ORDER — HYDROMORPHONE HCL PF 1 MG/ML IJ SOLN
0.2500 mg | INTRAMUSCULAR | Status: DC | PRN
  Administered 2013-07-22 (×4): 0.5 mg via INTRAVENOUS

## 2013-07-22 MED ORDER — FENTANYL CITRATE 0.05 MG/ML IJ SOLN: 50.0000 ug | INTRAMUSCULAR | Status: DC | PRN

## 2013-07-22 MED ORDER — METOCLOPRAMIDE HCL 5 MG/ML IJ SOLN: 5.0000 mg | Freq: Three times a day (TID) | INTRAMUSCULAR | Status: DC | PRN

## 2013-07-22 MED ORDER — LIDOCAINE HCL (CARDIAC) 20 MG/ML IV SOLN
INTRAVENOUS | Status: DC | PRN
  Administered 2013-07-22: 40 mg via INTRAVENOUS

## 2013-07-22 MED ORDER — METOCLOPRAMIDE HCL 5 MG PO TABS: 5.0000 mg | ORAL_TABLET | Freq: Three times a day (TID) | ORAL | Status: DC | PRN

## 2013-07-22 MED ORDER — MIDAZOLAM HCL 2 MG/2ML IJ SOLN
INTRAMUSCULAR | Status: AC
  Filled 2013-07-22: qty 2

## 2013-07-22 MED ORDER — PROPOFOL 10 MG/ML IV EMUL
INTRAVENOUS | Status: AC
  Filled 2013-07-22: qty 50

## 2013-07-22 MED ORDER — BUPIVACAINE HCL (PF) 0.5 % IJ SOLN
INTRAMUSCULAR | Status: AC
  Filled 2013-07-22: qty 30

## 2013-07-22 MED ORDER — EPINEPHRINE HCL 1 MG/ML IJ SOLN
INTRAMUSCULAR | Status: AC
  Filled 2013-07-22: qty 1

## 2013-07-22 SURGICAL SUPPLY — 57 items
BANDAGE ELASTIC 6 VELCRO ST LF (GAUZE/BANDAGES/DRESSINGS) ×2 IMPLANT
BANDAGE ESMARK 6X9 LF (GAUZE/BANDAGES/DRESSINGS) ×1 IMPLANT
BENZOIN TINCTURE PRP APPL 2/3 (GAUZE/BANDAGES/DRESSINGS) IMPLANT
BLADE 4.2CUDA (BLADE) IMPLANT
BLADE CUTTER GATOR 3.5 (BLADE) ×2 IMPLANT
BLADE GREAT WHITE 4.2 (BLADE) IMPLANT
BLADE SURG 15 STRL LF DISP TIS (BLADE) ×1 IMPLANT
BLADE SURG 15 STRL SS (BLADE) ×1
BNDG ESMARK 6X9 LF (GAUZE/BANDAGES/DRESSINGS) ×2
CANISTER OMNI JUG 16 LITER (MISCELLANEOUS) ×2 IMPLANT
CANISTER SUCT 3000ML (MISCELLANEOUS) IMPLANT
CHLORAPREP W/TINT 26ML (MISCELLANEOUS) ×2 IMPLANT
CUFF TOURNIQUET SINGLE 34IN LL (TOURNIQUET CUFF) ×2 IMPLANT
DRAPE ARTHROSCOPY W/POUCH 114 (DRAPES) ×2 IMPLANT
ELECT MENISCUS 165MM 90D (ELECTRODE) IMPLANT
ELECT REM PT RETURN 9FT ADLT (ELECTROSURGICAL) ×2
ELECTRODE REM PT RTRN 9FT ADLT (ELECTROSURGICAL) ×1 IMPLANT
GAUZE XEROFORM 1X8 LF (GAUZE/BANDAGES/DRESSINGS) ×2 IMPLANT
GLOVE BIO SURGEON STRL SZ7.5 (GLOVE) ×2 IMPLANT
GLOVE BIO SURGEON STRL SZ8.5 (GLOVE) ×2 IMPLANT
GLOVE BIOGEL PI IND STRL 6.5 (GLOVE) ×1 IMPLANT
GLOVE BIOGEL PI IND STRL 7.0 (GLOVE) ×1 IMPLANT
GLOVE BIOGEL PI IND STRL 8 (GLOVE) ×1 IMPLANT
GLOVE BIOGEL PI IND STRL 9 (GLOVE) ×1 IMPLANT
GLOVE BIOGEL PI INDICATOR 6.5 (GLOVE) ×1
GLOVE BIOGEL PI INDICATOR 7.0 (GLOVE) ×1
GLOVE BIOGEL PI INDICATOR 8 (GLOVE) ×1
GLOVE BIOGEL PI INDICATOR 9 (GLOVE) ×1
GLOVE EXAM NITRILE MD LF STRL (GLOVE) ×2 IMPLANT
GOWN PREVENTION PLUS XLARGE (GOWN DISPOSABLE) ×2 IMPLANT
GOWN PREVENTION PLUS XXLARGE (GOWN DISPOSABLE) ×4 IMPLANT
IV NS IRRIG 3000ML ARTHROMATIC (IV SOLUTION) ×4 IMPLANT
KNEE WRAP E Z 3 GEL PACK (MISCELLANEOUS) ×2 IMPLANT
NDL SAFETY ECLIPSE 18X1.5 (NEEDLE) ×1 IMPLANT
NEEDLE HYPO 18GX1.5 SHARP (NEEDLE) ×1
PACK ARTHROSCOPY DSU (CUSTOM PROCEDURE TRAY) ×2 IMPLANT
PACK BASIN DAY SURGERY FS (CUSTOM PROCEDURE TRAY) ×2 IMPLANT
PAD ALCOHOL SWAB (MISCELLANEOUS) ×2 IMPLANT
PENCIL BUTTON HOLSTER BLD 10FT (ELECTRODE) ×2 IMPLANT
SET ARTHROSCOPY TUBING (MISCELLANEOUS) ×1
SET ARTHROSCOPY TUBING LN (MISCELLANEOUS) ×1 IMPLANT
SLEEVE SCD COMPRESS KNEE MED (MISCELLANEOUS) ×2 IMPLANT
SPONGE GAUZE 4X4 12PLY (GAUZE/BANDAGES/DRESSINGS) ×2 IMPLANT
SPONGE LAP 4X18 X RAY DECT (DISPOSABLE) ×2 IMPLANT
STRIP CLOSURE SKIN 1/2X4 (GAUZE/BANDAGES/DRESSINGS) ×2 IMPLANT
SUCTION FRAZIER TIP 10 FR DISP (SUCTIONS) ×2 IMPLANT
SUT MON AB 4-0 PC3 18 (SUTURE) IMPLANT
SUT VIC AB 2-0 SH 27 (SUTURE)
SUT VIC AB 2-0 SH 27XBRD (SUTURE) IMPLANT
SUT VIC AB 3-0 PS1 18 (SUTURE) ×1
SUT VIC AB 3-0 PS1 18XBRD (SUTURE) ×1 IMPLANT
SYR 3ML 18GX1 1/2 (SYRINGE) IMPLANT
SYR 5ML LL (SYRINGE) ×2 IMPLANT
TOWEL OR 17X24 6PK STRL BLUE (TOWEL DISPOSABLE) ×2 IMPLANT
WAND STAR VAC 90 (SURGICAL WAND) IMPLANT
WATER STERILE IRR 1000ML POUR (IV SOLUTION) ×2 IMPLANT
YANKAUER SUCT BULB TIP NO VENT (SUCTIONS) IMPLANT

## 2013-07-22 NOTE — Op Note (Signed)
Pre-Op Dx: Left knee medial meniscal tear, chondromalacia, pes anserine ganglion  Postop Dx: Same, plus cartilaginous loose bodies  Procedure: Left knee arthroscopic partial medial meniscectomy, debridement chondromalacia, removal of cartilaginous loose bodies, open excision of pedis anserine ganglion multiloculated  Surgeon: Feliberto Gottron. [name] M.D.  Assist: Tomi Likens. Gaylene Brooks  (present throughout entire procedure and necessary for timely completion of the procedure) Anes: General LMA  EBL: Minimal  Fluids: 800 cc   Indications: Greater than 6 month history of lobular mass along the pes anserine bursa of the left knee that is mildly tender to palpation and by MRI scan is a multiloculated ganglion. Also evidence of chondromalacia and a small medial meniscal tear and she is tender along the medial joint line.. Pt has failed conservative treatment with anti-inflammatory medicines, physical therapy, and modified activites but did get good temporarily from an intra-articular cortisone injection. Pain has recurred and patient desires elective arthroscopic evaluation and treatment of knee, and open removal of the ganglion. Risks and benefits of surgery have been discussed and questions answered.  Procedure: Patient identified by arm band and taken to the operating room at the day surgery Center. The appropriate anesthetic monitors were attached, and General LMA anesthesia was induced without difficulty. Lateral post was applied to the table and the lower extremity was prepped and draped in usual sterile fashion from the ankle to the midthigh. Time out procedure was performed. Half percent Marcaine with epinephrine local anesthetic was then injected along the tract of the ganglion the inferomedial and lateral portals as well as into the knee joint itself for a total of 20 cc. We began the operation by making standard inferior lateral and inferior medial peripatellar portals with a #11 blade allowing  introduction of the arthroscope through the inferior lateral portal and the out flow to the inferior medial portal. Pump pressure was set at 100 mmHg and diagnostic arthroscopy  revealed chondromalacia of the apex the patella and trochlea grade 3 with flap tears is debrider back to a stable margin with a 3.5 mm Gator sucker shaver we also encountered a number of loose bodies up to 5-10 mm in size cartilaginous there were removed with the sucker shaver as well. He into the medial compartment complex degenerative tearing of the medial meniscus from posterior medial to the root was identified and debrided with a small biter a large biter and a 35 Gator sucker shaver. The anterior cruciate ligament and PCL are intact the lateral meniscus and lateral articular cartilages were intact. We did identify 2 more loose bodies on the lateral side there were removed with the sucker shaver 5 mm in size. Grade 2-3 chondromalacia medial femoral condyle is also identified and debrided focally over a 10 x 15 mm area. The knee was irrigated out normal saline solution. We then direct her attention to the multiloculated ganglion along the proximal medial tibia subcutaneous. A 4 cm skin incision was made over the ganglion where we applied the local anesthetic  Through the skin and subcutaneous tissue. Using tenotomy scissors we then dissected out the ganglion and removed it from its bed along the insertion of the tendons. Small bleeders were cauterized, the wound is irrigated out normal saline solution and closed in layers with 3-0 Vicryl suture in the subcutaneous and subcuticular tissues. Because the ganglion had a classic appearance was full of jelly like fluid it was not sent for pathology. A dressing of xerofoam 4 x 4 dressing sponges, web roll and an Ace wrap  was applied. The patient was awakened extubated and taken to the recovery without difficulty.     Signed: [name], MD

## 2013-07-22 NOTE — Transfer of Care (Signed)
Immediate Anesthesia Transfer of Care Note  Patient: Julia [name]  Procedure(s) Performed: Procedure(s) with comments: LEFT KNEE ARTHROSCOPY WITH DEBRIDEMENT CHONDROPLASTY, REMOVAL LOOSE BODIES, PARTIAL MEDIAL MENISCECTOMY, OPEN EXCISION OF PES GANGLION (Left) - NO SPECIMEN SENT PER DR. [name] ORDER.  Patient Location: PACU  Anesthesia Type:General  Level of Consciousness: awake, sedated and responds to stimulation  Airway & Oxygen Therapy: Patient Spontanous Breathing and Patient connected to face mask oxygen  Post-op Assessment: Report given to PACU RN, Post -op Vital signs reviewed and stable and Patient moving all extremities  Post vital signs: Reviewed and stable  Complications: No apparent anesthesia complications

## 2013-07-22 NOTE — Anesthesia Preprocedure Evaluation (Signed)
Anesthesia Evaluation  Patient identified by MRN, date of birth, ID band Patient awake    Reviewed: Allergy & Precautions, H&P , NPO status , Patient's Chart, lab work & pertinent test results  Airway Mallampati: I TM Distance: >3 FB Neck ROM: Full    Dental   Pulmonary  breath sounds clear to auscultation        Cardiovascular Rhythm:Regular Rate:Normal     Neuro/Psych    GI/Hepatic   Endo/Other    Renal/GU      Musculoskeletal   Abdominal   Peds  Hematology   Anesthesia Other Findings   Reproductive/Obstetrics                           Anesthesia Physical Anesthesia Plan  ASA: I  Anesthesia Plan: General   Post-op Pain Management:    Induction: Intravenous  Airway Management Planned: LMA  Additional Equipment:   Intra-op Plan:   Post-operative Plan: Extubation in OR  Informed Consent: I have reviewed the patients History and Physical, chart, labs and discussed the procedure including the risks, benefits and alternatives for the proposed anesthesia with the patient or authorized representative who has indicated his/her understanding and acceptance.     Plan Discussed with: CRNA and Surgeon  Anesthesia Plan Comments:         Anesthesia Quick Evaluation  

## 2013-07-22 NOTE — Anesthesia Procedure Notes (Signed)
Procedure Name: LMA Insertion Date/Time: 07/22/2013 11:56 AM Performed by: [name] Pre-anesthesia Checklist: Patient identified, Emergency Drugs available, Suction available and Patient being monitored Patient Re-evaluated:Patient Re-evaluated prior to inductionOxygen Delivery Method: Circle System Utilized Preoxygenation: Pre-oxygenation with 100% oxygen Intubation Type: IV induction Ventilation: Mask ventilation without difficulty LMA: LMA inserted LMA Size: 3.0 Number of attempts: 1 Airway Equipment and Method: bite block Placement Confirmation: positive ETCO2 and breath sounds checked- equal and bilateral Tube secured with: Tape Dental Injury: Teeth and Oropharynx as per pre-operative assessment

## 2013-07-22 NOTE — Anesthesia Postprocedure Evaluation (Signed)
  Anesthesia Post-op Note  Patient: Julia [name]  Procedure(s) Performed: Procedure(s) with comments: LEFT KNEE ARTHROSCOPY WITH DEBRIDEMENT CHONDROPLASTY, REMOVAL LOOSE BODIES, PARTIAL MEDIAL MENISCECTOMY, OPEN EXCISION OF PES GANGLION (Left) - NO SPECIMEN SENT PER DR. [name] ORDER.  Patient Location: PACU  Anesthesia Type:General  Level of Consciousness: awake and alert   Airway and Oxygen Therapy: Patient Spontanous Breathing  Post-op Pain: mild  Post-op Assessment: Post-op Vital signs reviewed, Patient's Cardiovascular Status Stable, Respiratory Function Stable, Patent Airway, No signs of Nausea or vomiting and Pain level controlled  Post-op Vital Signs: Reviewed and stable  Complications: No apparent anesthesia complications

## 2013-07-22 NOTE — Interval H&P Note (Signed)
History and Physical Interval Note:  07/22/2013 11:36 AM  Julia [name]  has presented today for surgery, with the diagnosis of LEFT KNEE CHONDROMALACIA,MEDIAL MENISCAL TEAR, PES ANSERINE GANGLION  The various methods of treatment have been discussed with the patient and family. After consideration of risks, benefits and other options for treatment, the patient has consented to  Procedure(s): LEFT ARTHROSCOPY KNEE WITH OPEN EXCISION OF PES GANGLION (Left) as a surgical intervention .  The patient's history has been reviewed, patient examined, no change in status, stable for surgery.  I have reviewed the patient's chart and labs.  Questions were answered to the patient's satisfaction.     [name]

## 2013-07-24 ENCOUNTER — Encounter (HOSPITAL_BASED_OUTPATIENT_CLINIC_OR_DEPARTMENT_OTHER): Payer: Self-pay | Admitting: Orthopedic Surgery

## 2013-08-04 ENCOUNTER — Ambulatory Visit: Payer: 59 | Attending: Orthopedic Surgery | Admitting: Physical Therapy

## 2013-08-04 DIAGNOSIS — R269 Unspecified abnormalities of gait and mobility: Secondary | ICD-10-CM | POA: Insufficient documentation

## 2013-08-04 DIAGNOSIS — M25569 Pain in unspecified knee: Secondary | ICD-10-CM | POA: Insufficient documentation

## 2013-08-04 DIAGNOSIS — M25669 Stiffness of unspecified knee, not elsewhere classified: Secondary | ICD-10-CM | POA: Insufficient documentation

## 2013-08-04 DIAGNOSIS — R5381 Other malaise: Secondary | ICD-10-CM | POA: Insufficient documentation

## 2013-08-04 DIAGNOSIS — IMO0001 Reserved for inherently not codable concepts without codable children: Secondary | ICD-10-CM | POA: Insufficient documentation

## 2013-08-06 ENCOUNTER — Ambulatory Visit: Payer: 59 | Admitting: *Deleted

## 2013-08-06 ENCOUNTER — Other Ambulatory Visit: Payer: Self-pay

## 2013-08-10 ENCOUNTER — Ambulatory Visit: Payer: 59

## 2013-08-12 ENCOUNTER — Ambulatory Visit: Payer: 59

## 2013-08-17 ENCOUNTER — Ambulatory Visit: Payer: 59 | Admitting: Physical Therapy

## 2013-08-19 ENCOUNTER — Ambulatory Visit: Payer: 59 | Admitting: Physical Therapy

## 2013-08-24 ENCOUNTER — Ambulatory Visit: Payer: 59 | Admitting: Physical Therapy

## 2013-08-24 ENCOUNTER — Other Ambulatory Visit: Payer: Self-pay | Admitting: Neurosurgery

## 2013-08-24 DIAGNOSIS — M542 Cervicalgia: Secondary | ICD-10-CM

## 2013-08-25 ENCOUNTER — Ambulatory Visit: Payer: 59 | Admitting: *Deleted

## 2013-08-26 ENCOUNTER — Encounter: Payer: 59 | Admitting: Physical Therapy

## 2013-09-01 ENCOUNTER — Ambulatory Visit
Admission: RE | Admit: 2013-09-01 | Discharge: 2013-09-01 | Disposition: A | Discharge status: Home / Self Care | Payer: 59 | Source: Ambulatory Visit | Attending: Neurosurgery | Admitting: Neurosurgery

## 2013-09-01 VITALS — BP 103/69 | HR 58

## 2013-09-01 DIAGNOSIS — M542 Cervicalgia: Secondary | ICD-10-CM

## 2013-09-01 MED ORDER — ONDANSETRON HCL 4 MG/2ML IJ SOLN
4.0000 mg | Freq: Once | INTRAMUSCULAR | Status: AC
  Administered 2013-09-01: 4 mg via INTRAMUSCULAR

## 2013-09-01 MED ORDER — HYDROMORPHONE HCL PF 1 MG/ML IJ SOLN
1.0000 mg | Freq: Once | INTRAMUSCULAR | Status: AC
  Administered 2013-09-01: 1 mg via INTRAMUSCULAR

## 2013-09-01 MED ORDER — IOHEXOL 300 MG/ML  SOLN: 10.0000 mL | Freq: Once | INTRAMUSCULAR | Status: AC | PRN

## 2013-09-01 MED ORDER — DIAZEPAM 5 MG PO TABS
10.0000 mg | ORAL_TABLET | Freq: Once | ORAL | Status: AC
  Administered 2013-09-01: 5 mg via ORAL

## 2013-09-01 NOTE — Progress Notes (Signed)
Patient states she hasn't taken any Tramadol in months.  jkl

## 2013-09-02 ENCOUNTER — Other Ambulatory Visit: Payer: 59

## 2013-09-03 ENCOUNTER — Telehealth: Payer: Self-pay | Admitting: Radiology

## 2013-09-03 NOTE — Telephone Encounter (Signed)
Pt c/o back pain post myelo, the injection site. Explained it could be sore for up to a week. Also, c/o headache, she states it's not the headache we explained to her. Called off work today due to back ache and wants a work note. She will come in to pick up note later today.

## 2013-09-14 ENCOUNTER — Encounter: Payer: 59 | Admitting: Physical Therapy

## 2013-09-16 ENCOUNTER — Ambulatory Visit: Payer: 59 | Attending: Orthopedic Surgery | Admitting: Physical Therapy

## 2013-09-16 DIAGNOSIS — IMO0001 Reserved for inherently not codable concepts without codable children: Secondary | ICD-10-CM | POA: Insufficient documentation

## 2013-09-16 DIAGNOSIS — M25669 Stiffness of unspecified knee, not elsewhere classified: Secondary | ICD-10-CM | POA: Insufficient documentation

## 2013-09-16 DIAGNOSIS — R5381 Other malaise: Secondary | ICD-10-CM | POA: Insufficient documentation

## 2013-09-16 DIAGNOSIS — R269 Unspecified abnormalities of gait and mobility: Secondary | ICD-10-CM | POA: Insufficient documentation

## 2013-09-16 DIAGNOSIS — M25569 Pain in unspecified knee: Secondary | ICD-10-CM | POA: Insufficient documentation

## 2013-09-21 ENCOUNTER — Ambulatory Visit: Payer: 59 | Admitting: *Deleted

## 2013-09-22 ENCOUNTER — Ambulatory Visit: Payer: 59 | Admitting: *Deleted

## 2013-09-28 ENCOUNTER — Ambulatory Visit: Payer: 59 | Admitting: Physical Therapy

## 2013-10-02 ENCOUNTER — Ambulatory Visit: Payer: 59 | Attending: Orthopedic Surgery | Admitting: Physical Therapy

## 2013-10-02 DIAGNOSIS — R5381 Other malaise: Secondary | ICD-10-CM | POA: Insufficient documentation

## 2013-10-02 DIAGNOSIS — IMO0001 Reserved for inherently not codable concepts without codable children: Secondary | ICD-10-CM | POA: Insufficient documentation

## 2013-10-02 DIAGNOSIS — M25669 Stiffness of unspecified knee, not elsewhere classified: Secondary | ICD-10-CM | POA: Insufficient documentation

## 2013-10-02 DIAGNOSIS — M25569 Pain in unspecified knee: Secondary | ICD-10-CM | POA: Insufficient documentation

## 2013-10-02 DIAGNOSIS — R269 Unspecified abnormalities of gait and mobility: Secondary | ICD-10-CM | POA: Insufficient documentation

## 2013-10-05 ENCOUNTER — Ambulatory Visit: Payer: 59 | Admitting: Physical Therapy

## 2013-10-06 ENCOUNTER — Ambulatory Visit: Payer: 59 | Admitting: *Deleted

## 2013-10-12 ENCOUNTER — Ambulatory Visit: Payer: 59 | Admitting: Physical Therapy

## 2013-10-14 ENCOUNTER — Ambulatory Visit: Payer: 59 | Admitting: Physical Therapy

## 2013-10-20 ENCOUNTER — Ambulatory Visit: Payer: 59 | Admitting: Physical Therapy

## 2013-10-23 ENCOUNTER — Ambulatory Visit: Payer: 59 | Admitting: *Deleted

## 2013-10-26 ENCOUNTER — Ambulatory Visit: Payer: 59 | Admitting: Physical Therapy

## 2013-10-28 ENCOUNTER — Ambulatory Visit: Payer: 59 | Admitting: Physical Therapy

## 2013-11-02 ENCOUNTER — Ambulatory Visit: Payer: 59 | Attending: Orthopedic Surgery | Admitting: Physical Therapy

## 2013-11-02 DIAGNOSIS — R269 Unspecified abnormalities of gait and mobility: Secondary | ICD-10-CM | POA: Insufficient documentation

## 2013-11-02 DIAGNOSIS — M25569 Pain in unspecified knee: Secondary | ICD-10-CM | POA: Insufficient documentation

## 2013-11-02 DIAGNOSIS — R5381 Other malaise: Secondary | ICD-10-CM | POA: Insufficient documentation

## 2013-11-02 DIAGNOSIS — M25669 Stiffness of unspecified knee, not elsewhere classified: Secondary | ICD-10-CM | POA: Insufficient documentation

## 2013-11-02 DIAGNOSIS — IMO0001 Reserved for inherently not codable concepts without codable children: Secondary | ICD-10-CM | POA: Insufficient documentation

## 2013-11-04 ENCOUNTER — Ambulatory Visit: Payer: 59 | Admitting: Physical Therapy

## 2013-11-11 ENCOUNTER — Encounter: Payer: Self-pay | Admitting: Nurse Practitioner

## 2013-11-11 ENCOUNTER — Ambulatory Visit (INDEPENDENT_AMBULATORY_CARE_PROVIDER_SITE_OTHER): Payer: 59 | Admitting: Nurse Practitioner

## 2013-11-11 VITALS — BP 125/81 | HR 68 | Temp 98.2°F | Ht 61.0 in | Wt 150.0 lb

## 2013-11-11 DIAGNOSIS — F3289 Other specified depressive episodes: Secondary | ICD-10-CM

## 2013-11-11 DIAGNOSIS — R635 Abnormal weight gain: Secondary | ICD-10-CM

## 2013-11-11 DIAGNOSIS — F329 Major depressive disorder, single episode, unspecified: Secondary | ICD-10-CM

## 2013-11-11 DIAGNOSIS — Z23 Encounter for immunization: Secondary | ICD-10-CM

## 2013-11-11 DIAGNOSIS — F32A Depression, unspecified: Secondary | ICD-10-CM

## 2013-11-11 DIAGNOSIS — H9319 Tinnitus, unspecified ear: Secondary | ICD-10-CM

## 2013-11-11 MED ORDER — TETANUS-DIPHTH-ACELL PERTUSSIS 5-2.5-18.5 LF-MCG/0.5 IM SUSP: 0.5000 mL | Freq: Once | INTRAMUSCULAR | Status: DC

## 2013-11-11 MED ORDER — ESCITALOPRAM OXALATE 10 MG PO TABS: 10.0000 mg | ORAL_TABLET | Freq: Every day | ORAL | Status: DC

## 2013-11-11 NOTE — Progress Notes (Signed)
   Subjective:    Patient ID: Julia [name], female    DOB: 03/22/1961, 53 y.o.   MRN: [id number]  HPI PAtient has been on mobic for multiple joint problems- When she refilled it she looked at medication side effects  and they included ringing in the ears, mood changes and weight gain. She says that she has constant ringing in the ears and she has gained at least 10lbs and she is depressed all the time.    Review of Systems  Constitutional: Positive for unexpected weight change.  HENT: Negative.   Eyes: Negative.   Respiratory: Negative.   Cardiovascular: Negative.   Gastrointestinal: Negative.   Genitourinary: Negative.   Neurological: Negative.   Hematological: Negative.   Psychiatric/Behavioral: Negative for suicidal ideas and sleep disturbance.  All other systems reviewed and are negative.       Objective:   Physical Exam  Constitutional: She is oriented to person, place, and time. She appears well-developed and well-nourished.  Cardiovascular: Normal rate, regular rhythm and normal heart sounds.   Pulmonary/Chest: Effort normal and breath sounds normal.  Abdominal: Soft. Bowel sounds are normal.  Neurological: She is alert and oriented to person, place, and time.  Skin: Skin is warm.  Psychiatric: Her behavior is normal. Judgment and thought content normal.  Tearful throughout exam. Poor eye contact- answers all questions appropriately.   BP 125/81  Pulse 68  Temp(Src) 98.2 F (36.8 C) (Oral)  Ht 5\' 1"  (1.549 m)  Wt 150 lb (68.04 kg)  BMI 28.36 kg/m2  LMP 01/02/2010        Assessment & Plan:   1. Depression   2. Ringing in the ears   3. Weight gain    Orders Placed This Encounter  Procedures  . Ambulatory referral to Audiology    Referral Priority:  Urgent    Referral Type:  Audiology Exam    Referral Reason:  Specialty Services Required    Number of Visits Requested:  1   Meds ordered this encounter  Medications  . escitalopram (LEXAPRO) 10 MG  tablet    Sig: Take 1 tablet (10 mg total) by mouth daily.    Dispense:  30 tablet    Refill:  3    Order Specific Question:  Supervising Provider    Answer:  Christell ConstantMOORE, DONALD W [[id number]   Td shot today Labs pending Health maintenance reviewed Diet and exercise encouraged Continue all meds Follow up  In 3 weeks   Julia Daphine DeutscherMartin, FNP

## 2013-11-11 NOTE — Patient Instructions (Signed)

## 2013-11-12 LAB — CMP14+EGFR
ALT: 14 IU/L (ref 0–32)
AST: 15 IU/L (ref 0–40)
Albumin/Globulin Ratio: 1.8 (ref 1.1–2.5)
Albumin: 4.4 g/dL (ref 3.5–5.5)
Alkaline Phosphatase: 44 IU/L (ref 39–117)
BUN/Creatinine Ratio: 19 (ref 9–23)
BUN: 16 mg/dL (ref 6–24)
CO2: 26 mmol/L (ref 18–29)
Calcium: 9.3 mg/dL (ref 8.7–10.2)
Chloride: 99 mmol/L (ref 97–108)
Creatinine, Ser: 0.84 mg/dL (ref 0.57–1.00)
GFR calc Af Amer: 93 mL/min/{1.73_m2} (ref >59)
GFR calc non Af Amer: 81 mL/min/{1.73_m2} (ref >59)
Globulin, Total: 2.4 g/dL (ref 1.5–4.5)
Glucose: 99 mg/dL (ref 65–99)
Potassium: 4.3 mmol/L (ref 3.5–5.2)
Sodium: 139 mmol/L (ref 134–144)
Total Bilirubin: 0.6 mg/dL (ref 0.0–1.2)
Total Protein: 6.8 g/dL (ref 6.0–8.5)

## 2013-11-12 LAB — THYROID PANEL WITH TSH
Free Thyroxine Index: 2.9 (ref 1.2–4.9)
T3 Uptake Ratio: 31 % (ref 24–39)
T4, Total: 9.3 ug/dL (ref 4.5–12.0)
TSH: 3.07 u[IU]/mL (ref 0.450–4.500)

## 2013-11-18 ENCOUNTER — Telehealth: Payer: Self-pay | Admitting: Nurse Practitioner

## 2013-11-19 ENCOUNTER — Telehealth: Payer: Self-pay | Admitting: Nurse Practitioner

## 2013-11-19 NOTE — Telephone Encounter (Signed)
Please check on pt's referral and call to advise

## 2013-11-25 ENCOUNTER — Ambulatory Visit (INDEPENDENT_AMBULATORY_CARE_PROVIDER_SITE_OTHER): Payer: 59 | Admitting: Nurse Practitioner

## 2013-11-25 ENCOUNTER — Encounter: Payer: Self-pay | Admitting: Nurse Practitioner

## 2013-11-25 VITALS — BP 125/81 | HR 72 | Temp 97.8°F | Ht 61.0 in | Wt 153.0 lb

## 2013-11-25 DIAGNOSIS — F3289 Other specified depressive episodes: Secondary | ICD-10-CM

## 2013-11-25 DIAGNOSIS — F411 Generalized anxiety disorder: Secondary | ICD-10-CM | POA: Insufficient documentation

## 2013-11-25 DIAGNOSIS — F32A Depression, unspecified: Secondary | ICD-10-CM

## 2013-11-25 DIAGNOSIS — F419 Anxiety disorder, unspecified: Secondary | ICD-10-CM | POA: Insufficient documentation

## 2013-11-25 DIAGNOSIS — F329 Major depressive disorder, single episode, unspecified: Secondary | ICD-10-CM

## 2013-11-25 MED ORDER — CITALOPRAM HYDROBROMIDE 40 MG PO TABS: 40.0000 mg | ORAL_TABLET | Freq: Every day | ORAL | Status: DC

## 2013-11-25 NOTE — Progress Notes (Signed)
   Subjective:    Patient ID: Julia [name], female    DOB: 11/22/1960, 53 y.o.   MRN: [id number]  HPI  Patient in today for follow up- she was seen 3 weeks ago- was stressed with family problems- started on lexapro- doing some better- stress at home has not changed but she is able to deal with it better. Only issue she can see if headaches in the morning. Her main problems is her insurance runs out at end of this week, so not sure if she can afford it.    Review of Systems  Constitutional: Negative.   HENT: Negative.   Respiratory: Negative.   Cardiovascular: Negative.   Gastrointestinal: Negative.   Neurological: Negative.   Hematological: Negative.   Psychiatric/Behavioral: Negative.   All other systems reviewed and are negative.       Objective:   Physical Exam  Constitutional: She is oriented to person, place, and time. She appears well-developed and well-nourished.  Cardiovascular: Normal rate, regular rhythm and normal heart sounds.   Pulmonary/Chest: Effort normal and breath sounds normal.  Musculoskeletal: Normal range of motion.  Neurological: She is alert and oriented to person, place, and time.  Skin: Skin is warm.  Psychiatric: She has a normal mood and affect. Her behavior is normal. Judgment and thought content normal.    BP 125/81  Pulse 72  Temp(Src) 97.8 F (36.6 C) (Oral)  Ht 5\' 1"  (1.549 m)  Wt 153 lb (69.4 kg)  BMI 28.92 kg/m2  LMP 01/02/2010       Assessment & Plan:   1. Depression   2. GAD (generalized anxiety disorder)    Meds ordered this encounter  Medications  . meloxicam (MOBIC) 15 MG tablet    Sig:   . citalopram (CELEXA) 40 MG tablet    Sig: Take 1 tablet (40 mg total) by mouth daily.    Dispense:  30 tablet    Refill:  3    Order Specific Question:  Supervising Provider    Answer:  Ernestina PennaMOORE, DONALD W [[id number]]   Changed from lexapro to celexa due to no insurance Stress management Exercise RTO in 3 months follow  up  Mary-Margaret Daphine DeutscherMartin, FNP

## 2013-11-25 NOTE — Patient Instructions (Signed)
Stress Management Stress is a state of physical or mental tension that often results from changes in your life or normal routine. Some common causes of stress are:  Death of a loved one.  Injuries or severe illnesses.  Getting fired or changing jobs.  Moving into a new home. Other causes may be:  Sexual problems.  Business or financial losses.  Taking on a large debt.  Regular conflict with someone at home or at work.  Constant tiredness from lack of sleep. It is not just bad things that are stressful. It may be stressful to:  Win the lottery.  Get married.  Buy a new car. The amount of stress that can be easily tolerated varies from person to person. Changes generally cause stress, regardless of the types of change. Too much stress can affect your health. It may lead to physical or emotional problems. Too little stress (boredom) may also become stressful. SUGGESTIONS TO REDUCE STRESS:  Talk things over with your family and friends. It often is helpful to share your concerns and worries. If you feel your problem is serious, you may want to get help from a professional counselor.  Consider your problems one at a time instead of lumping them all together. Trying to take care of everything at once may seem impossible. List all the things you need to do and then start with the most important one. Set a goal to accomplish 2 or 3 things each day. If you expect to do too many in a single day you will naturally fail, causing you to feel even more stressed.  Do not use alcohol or drugs to relieve stress. Although you may feel better for a short time, they do not remove the problems that caused the stress. They can also be habit forming.  Exercise regularly - at least 3 times per week. Physical exercise can help to relieve that "uptight" feeling and will relax you.  The shortest distance between despair and hope is often a good night's sleep.  Go to bed and get up on time allowing  yourself time for appointments without being rushed.  Take a short "time-out" period from any stressful situation that occurs during the day. Close your eyes and take some deep breaths. Starting with the muscles in your face, tense them, hold it for a few seconds, then relax. Repeat this with the muscles in your neck, shoulders, hand, stomach, back and legs.  Take good care of yourself. Eat a balanced diet and get plenty of rest.  Schedule time for having fun. Take a break from your daily routine to relax. HOME CARE INSTRUCTIONS   Call if you feel overwhelmed by your problems and feel you can no longer manage them on your own.  Return immediately if you feel like hurting yourself or someone else. Document Released: 03/13/2001 Document Revised: 12/10/2011 Document Reviewed: 05/12/2013 [employer] Patient Information 2014 [employer]  

## 2013-12-02 ENCOUNTER — Ambulatory Visit: Payer: 59 | Admitting: Nurse Practitioner

## 2014-01-22 IMAGING — CT CT CERVICAL SPINE W/ CM
3 of 4 series · 16 of 29 positions shown, 18 images · non-contrast
Comparison: none

CLINICAL DATA: Neck pain. Bilateral arm pain. Previous cervical
fusion.
TECHNIQUE: Contiguous axial images were obtained through the Cervical spine
after the intrathecal infusion of infusion. Coronal and sagittal
reconstructions were obtained of the axial image sets.

[Series 2: c spine bone · axial · 0.27mm/px · z∈[+58,+178]mm · 5 of 73 slices shown, 7 images]
[im 13/73  soft-tissue]
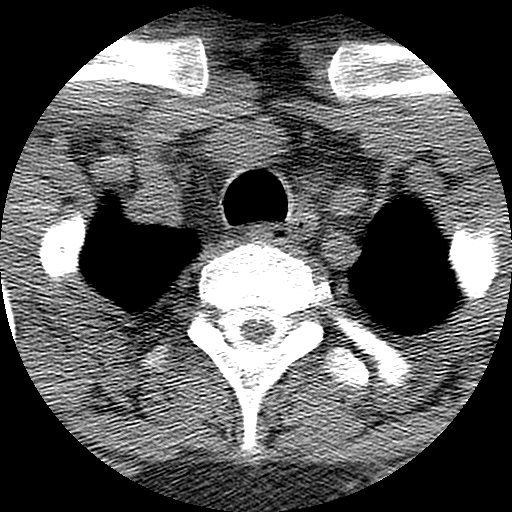
[im 13/73  bone]
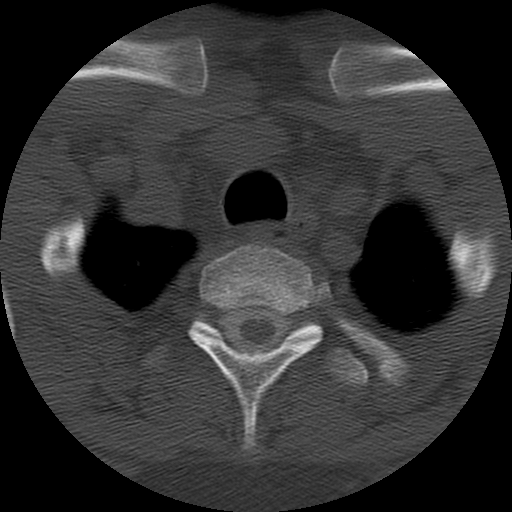
[im 25/73  bone]
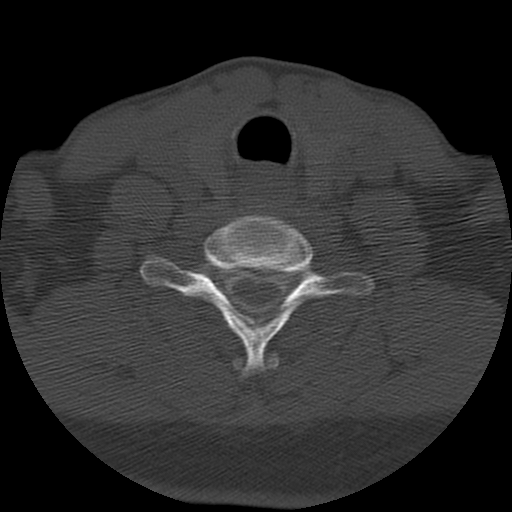
[im 37/73  bone]
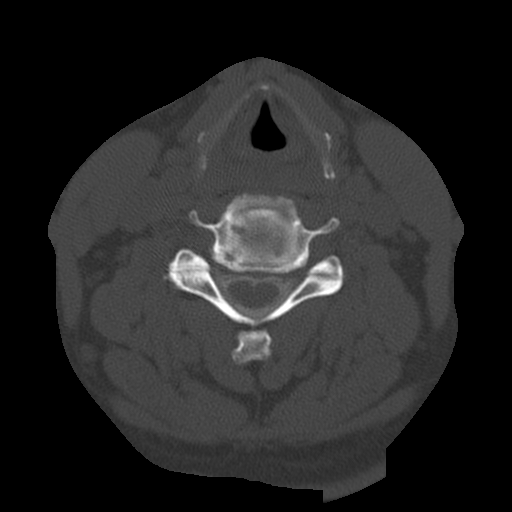
[im 49/73  bone]
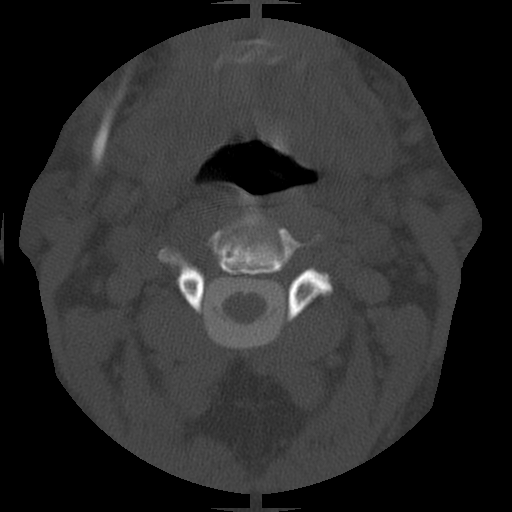
[im 61/73  soft-tissue]
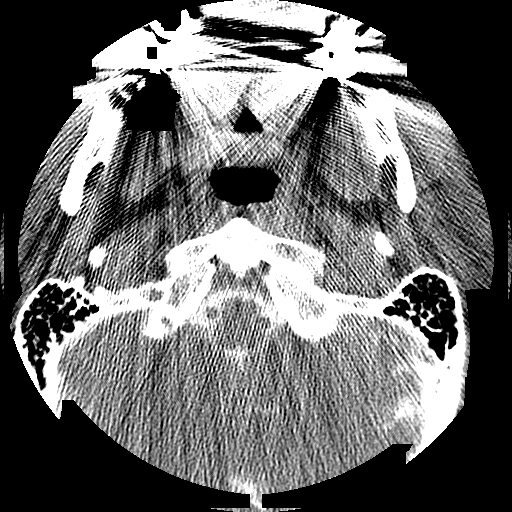
[im 61/73  bone]
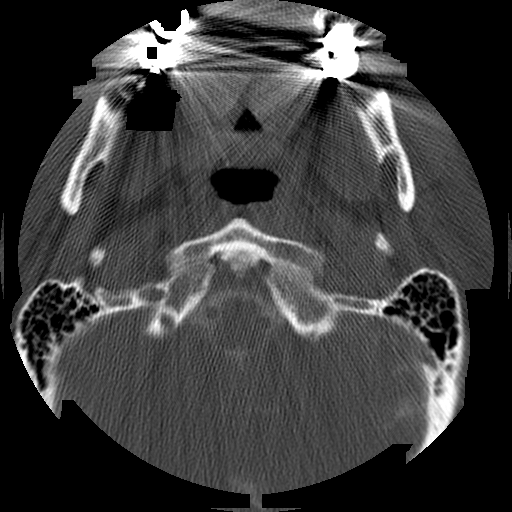

[Series 3: c spine soft · axial · 0.27mm/px · z∈[+58,+178]mm · 5 of 73 slices shown]
[im 13/73  soft-tissue]
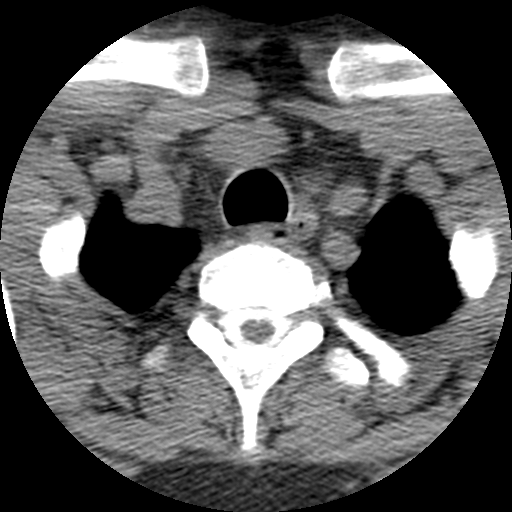
[im 25/73  soft-tissue]
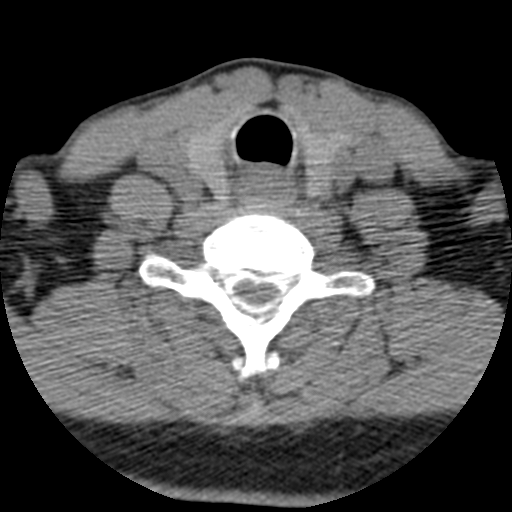
[im 37/73  soft-tissue]
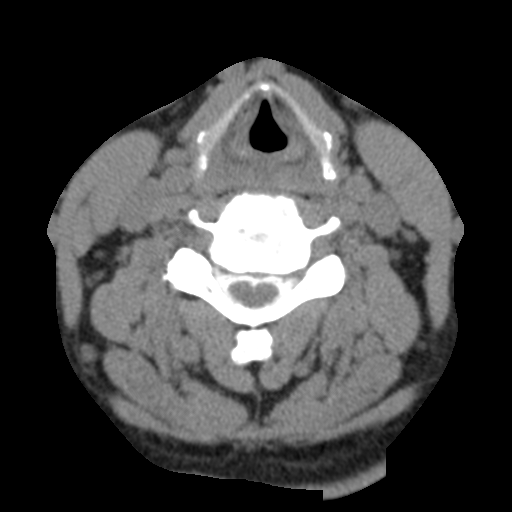
[im 49/73  soft-tissue]
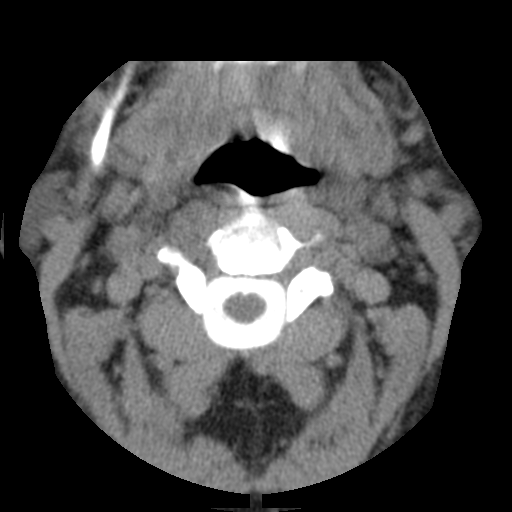
[im 61/73  soft-tissue]
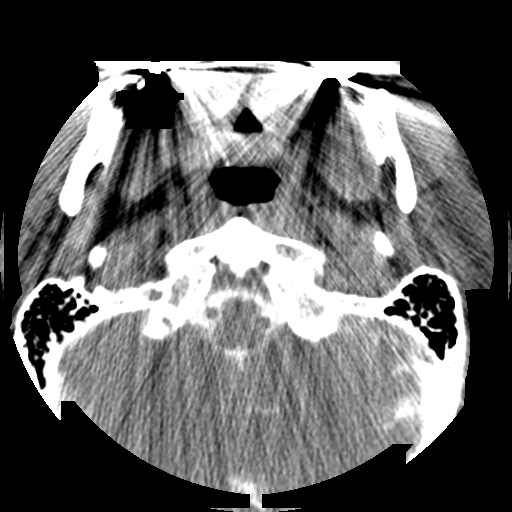

[Series 400: coronal · coronal · 0.36mm/px · 6 of 42 slices shown]
[im 2/42  soft-tissue]
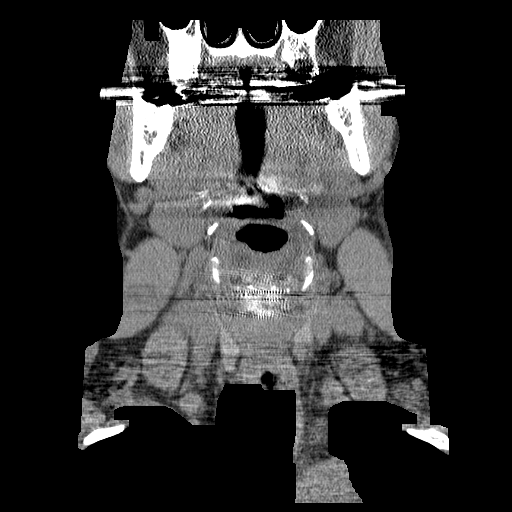
[im 7/42  bone]
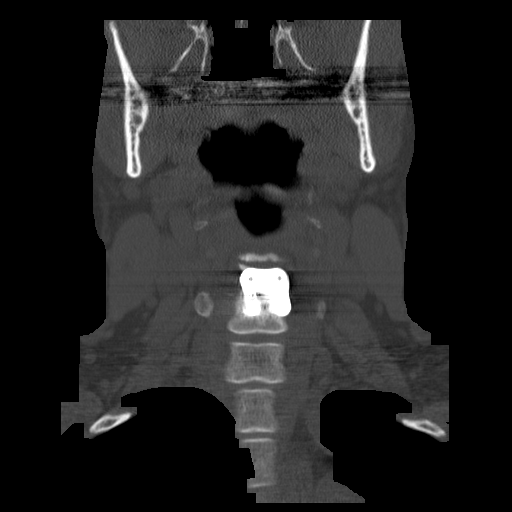
[im 14/42  bone]
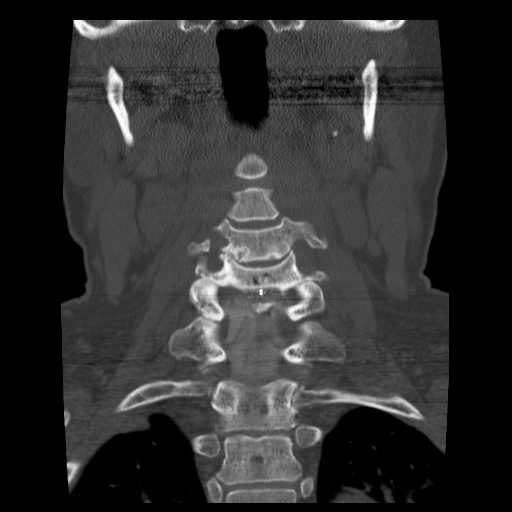
[im 21/42  bone]
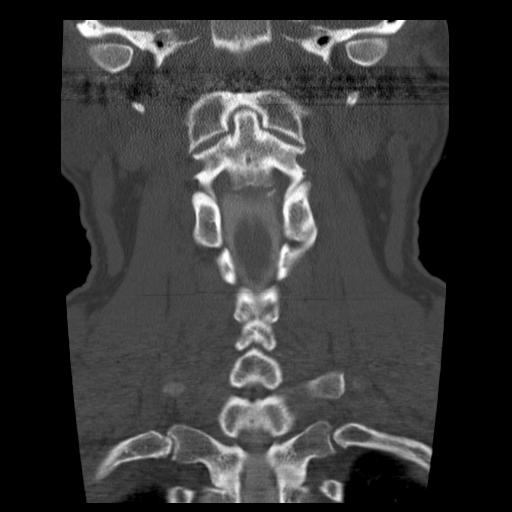
[im 28/42  bone]
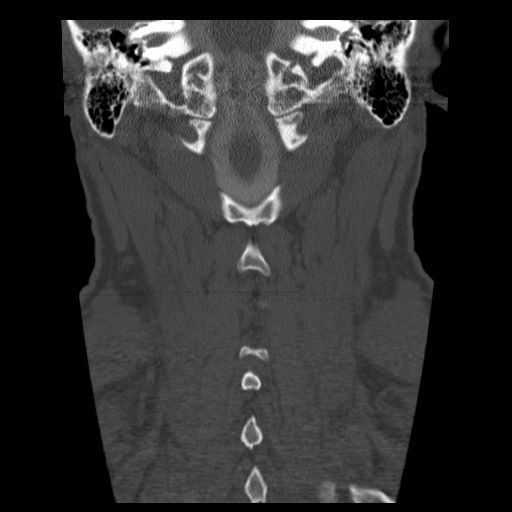
[im 35/42  bone]
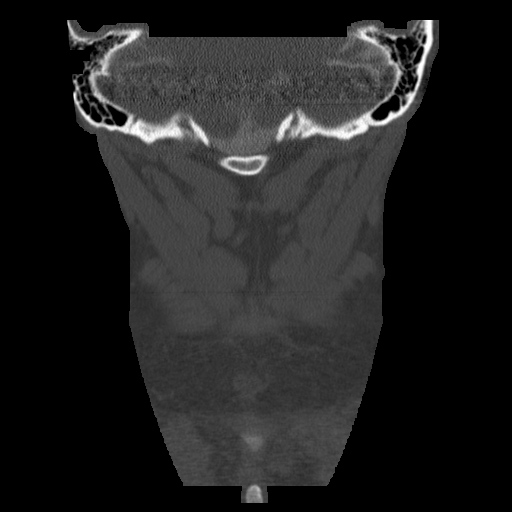

[16 of 29 positions shown; findings below may reference images not displayed]

FLUOROSCOPY TIME:  47 seconds

PROCEDURE:
LUMBAR PUNCTURE FOR CERVICAL MYELOGRAM

After thorough discussion of risks and benefits of the procedure
including bleeding, infection, injury to nerves, blood vessels,
adjacent structures as well as headache and CSF leak, written and
oral informed consent was obtained. Consent was obtained by Dr. Kjire
First. We discussed the high likelihood of obtaining a diagnostic
study.

Patient was positioned prone on the fluoroscopy table. Local
anesthesia was provided with 1% lidocaine without epinephrine after
prepped and draped in the usual sterile fashion. Puncture was
performed at L3-L4 using a 3 1/2 inch 22-gauge spinal needle via
midline approach. Using a single pass through the dura, the needle
was placed within the thecal sac, with return of clear CSF. 10 mL of
Rmnipaque-IDD was injected into the thecal sac, with normal
opacification of the nerve roots and cauda equina consistent with
free flow within the subarachnoid space. The patient was then moved
to the trendelenburg position and contrast flowed into the Cervical
spine region.

I personally performed the lumbar puncture and administered the
intrathecal contrast. I also personally supervised acquisition of
the myelogram images.
FINDINGS: Comparison is made with MRI from 10/09/2012 and 04/27/2011. CERVICAL
MYELOGRAM FINDINGS:

Good opacification of the cervical subarachnoid space. No nerve root
cut off is evident. This C5-6 fusion is solid. There is advanced
disc space narrowing at C4-5 with moderate anterior and posterior
spurring. Mild stenosis is present at the C4-5 level. There is mild
annular bulging at the C3-4 level.

CT CERVICAL MYELOGRAM FINDINGS:

No neck masses.  Lung apices clear.

The individual disc spaces were examined as follows:

C2-3:  Normal.

C3-4:  Mild bulge.  Mild disc space narrowing.  No impingement.

C4-5: Moderate disc space narrowing. Shallow central protrusion.
Endplate osteophytes project anteriorly and posteriorly. Bilateral
uncinate spurring and mild bilateral facet arthropathy. Bilateral C5
nerve root impingement is likely. There is mild canal stenosis
without cord compression.

C5-6:  Solid fusion.  No residual impingement.

C6-7:  Mild bulge.  No impingement.

C7-T1:  Normal interspace.  Mild facet arthropathy.

Compared with prior recent MR, there is good general agreement.
Compared with 1431 MRI, there is progression of adjacent segment
disease at C4-5.
IMPRESSION: Solid C5-6 fusion.

Moderate adjacent segment disease C4-5. Bilateral C5 nerve root
impingement is likely.

## 2014-08-13 ENCOUNTER — Other Ambulatory Visit: Payer: Self-pay | Admitting: Nurse Practitioner

## 2014-08-13 NOTE — Telephone Encounter (Signed)
Last seen 11/2013 

## 2014-11-25 ENCOUNTER — Other Ambulatory Visit (HOSPITAL_COMMUNITY): Payer: Self-pay | Admitting: Physician Assistant

## 2014-11-25 DIAGNOSIS — Z1231 Encounter for screening mammogram for malignant neoplasm of breast: Secondary | ICD-10-CM

## 2014-12-13 ENCOUNTER — Ambulatory Visit (HOSPITAL_COMMUNITY)
Admission: RE | Admit: 2014-12-13 | Discharge: 2014-12-13 | Disposition: A | Discharge status: Home / Self Care | Payer: Self-pay | Source: Ambulatory Visit | Attending: Physician Assistant | Admitting: Physician Assistant

## 2014-12-13 DIAGNOSIS — Z1231 Encounter for screening mammogram for malignant neoplasm of breast: Secondary | ICD-10-CM

## 2015-01-05 ENCOUNTER — Other Ambulatory Visit (HOSPITAL_COMMUNITY): Payer: Self-pay | Admitting: Physician Assistant

## 2015-01-06 ENCOUNTER — Other Ambulatory Visit (HOSPITAL_COMMUNITY): Payer: Self-pay | Admitting: Physician Assistant

## 2015-01-06 DIAGNOSIS — M25511 Pain in right shoulder: Secondary | ICD-10-CM

## 2015-01-11 ENCOUNTER — Ambulatory Visit (HOSPITAL_COMMUNITY)
Admission: RE | Admit: 2015-01-11 | Discharge: 2015-01-11 | Disposition: A | Discharge status: Home / Self Care | Payer: Self-pay | Source: Ambulatory Visit | Attending: Physician Assistant | Admitting: Physician Assistant

## 2015-01-11 DIAGNOSIS — M25511 Pain in right shoulder: Secondary | ICD-10-CM | POA: Insufficient documentation

## 2015-07-06 ENCOUNTER — Other Ambulatory Visit: Payer: Self-pay | Admitting: Physician Assistant

## 2015-07-06 DIAGNOSIS — Z79899 Other long term (current) drug therapy: Secondary | ICD-10-CM

## 2015-07-07 LAB — LIPID PANEL
Cholesterol: 212 mg/dL — ABNORMAL HIGH (ref 125–200)
HDL: 44 mg/dL — ABNORMAL LOW (ref >=46)
LDL Cholesterol: 140 mg/dL — ABNORMAL HIGH (ref <130)
Total CHOL/HDL Ratio: 4.8 Ratio (ref <=5.0)
Triglycerides: 138 mg/dL (ref <150)
VLDL: 28 mg/dL (ref <30)

## 2015-07-07 LAB — CBC WITH DIFFERENTIAL/PLATELET
Basophils Absolute: 0.1 10*3/uL (ref 0.0–0.1)
Basophils Relative: 1 % (ref 0–1)
Eosinophils Absolute: 0.2 10*3/uL (ref 0.0–0.7)
Eosinophils Relative: 3 % (ref 0–5)
HCT: 42.3 % (ref 36.0–46.0)
Hemoglobin: 13.7 g/dL (ref 12.0–15.0)
Lymphocytes Relative: 18 % (ref 12–46)
Lymphs Abs: 1.1 10*3/uL (ref 0.7–4.0)
MCH: 30.8 pg (ref 26.0–34.0)
MCHC: 32.4 g/dL (ref 30.0–36.0)
MCV: 95.1 fL (ref 78.0–100.0)
MPV: 10.5 fL (ref 8.6–12.4)
Monocytes Absolute: 0.4 10*3/uL (ref 0.1–1.0)
Monocytes Relative: 7 % (ref 3–12)
Neutro Abs: 4.2 10*3/uL (ref 1.7–7.7)
Neutrophils Relative %: 71 % (ref 43–77)
Platelets: 235 10*3/uL (ref 150–400)
RBC: 4.45 MIL/uL (ref 3.87–5.11)
RDW: 13.1 % (ref 11.5–15.5)
WBC: 5.9 10*3/uL (ref 4.0–10.5)

## 2015-07-07 LAB — TSH: TSH: 1.096 u[IU]/mL (ref 0.350–4.500)

## 2015-07-07 LAB — COMPREHENSIVE METABOLIC PANEL
ALT: 9 U/L (ref 6–29)
AST: 14 U/L (ref 10–35)
Albumin: 3.9 g/dL (ref 3.6–5.1)
Alkaline Phosphatase: 45 U/L (ref 33–130)
BUN: 15 mg/dL (ref 7–25)
CO2: 28 mmol/L (ref 20–31)
Calcium: 9 mg/dL (ref 8.6–10.4)
Chloride: 106 mmol/L (ref 98–110)
Creat: 0.86 mg/dL (ref 0.50–1.05)
Glucose, Bld: 87 mg/dL (ref 65–99)
Potassium: 4.5 mmol/L (ref 3.5–5.3)
Sodium: 139 mmol/L (ref 135–146)
Total Bilirubin: 0.5 mg/dL (ref 0.2–1.2)
Total Protein: 6.7 g/dL (ref 6.1–8.1)

## 2015-07-07 LAB — HEMOGLOBIN A1C
Hgb A1c MFr Bld: 5.5 % (ref <5.7)
Mean Plasma Glucose: 111 mg/dL (ref <117)

## 2015-07-08 LAB — VITAMIN D 25 HYDROXY (VIT D DEFICIENCY, FRACTURES): Vit D, 25-Hydroxy: 30 ng/mL (ref 30–100)

## 2015-08-09 ENCOUNTER — Other Ambulatory Visit: Payer: Self-pay | Admitting: Physician Assistant

## 2015-08-09 DIAGNOSIS — K219 Gastro-esophageal reflux disease without esophagitis: Secondary | ICD-10-CM

## 2015-08-15 ENCOUNTER — Ambulatory Visit: Payer: Self-pay | Admitting: Physician Assistant

## 2015-08-15 ENCOUNTER — Encounter: Payer: Self-pay | Admitting: Physician Assistant

## 2015-08-15 VITALS — BP 122/84 | HR 63 | Temp 97.9°F | Ht 61.0 in | Wt 162.4 lb

## 2015-08-15 DIAGNOSIS — K5900 Constipation, unspecified: Secondary | ICD-10-CM

## 2015-08-15 DIAGNOSIS — M25511 Pain in right shoulder: Secondary | ICD-10-CM

## 2015-08-15 DIAGNOSIS — K219 Gastro-esophageal reflux disease without esophagitis: Secondary | ICD-10-CM

## 2015-08-15 DIAGNOSIS — E782 Mixed hyperlipidemia: Secondary | ICD-10-CM | POA: Insufficient documentation

## 2015-08-15 DIAGNOSIS — E785 Hyperlipidemia, unspecified: Secondary | ICD-10-CM

## 2015-08-15 DIAGNOSIS — G8929 Other chronic pain: Secondary | ICD-10-CM | POA: Insufficient documentation

## 2015-08-15 DIAGNOSIS — M25519 Pain in unspecified shoulder: Secondary | ICD-10-CM

## 2015-08-15 HISTORY — DX: Gastro-esophageal reflux disease without esophagitis: K21.9

## 2015-08-15 HISTORY — DX: Hyperlipidemia, unspecified: E78.5

## 2015-08-15 MED ORDER — MELOXICAM 15 MG PO TABS: 15.0000 mg | ORAL_TABLET | Freq: Every day | ORAL | Status: DC | PRN

## 2015-08-15 MED ORDER — LOVASTATIN 20 MG PO TABS: 20.0000 mg | ORAL_TABLET | Freq: Every day | ORAL | Status: DC

## 2015-08-15 NOTE — Patient Instructions (Addendum)
Constipation, Adult Constipation is when a person has fewer than three bowel movements a week, has difficulty having a bowel movement, or has stools that are dry, hard, or larger than normal. As people grow older, constipation is more common. A low-fiber diet, not taking in enough fluids, and taking certain medicines may make constipation worse.  CAUSES   Certain medicines, such as antidepressants, pain medicine, iron supplements, antacids, and water pills.   Certain diseases, such as diabetes, irritable bowel syndrome (IBS), thyroid disease, or depression.   Not drinking enough water.   Not eating enough fiber-rich foods.   Stress or travel.   Lack of physical activity or exercise.   Ignoring the urge to have a bowel movement.   Using laxatives too much.  SIGNS AND SYMPTOMS   Having fewer than three bowel movements a week.   Straining to have a bowel movement.   Having stools that are hard, dry, or larger than normal.   Feeling full or bloated.   Pain in the lower abdomen.   Not feeling relief after having a bowel movement.  DIAGNOSIS  Your health care provider will take a medical history and perform a physical exam. Further testing may be done for severe constipation. Some tests may include:  A barium enema X-ray to examine your rectum, colon, and, sometimes, your small intestine.   A sigmoidoscopy to examine your lower colon.   A colonoscopy to examine your entire colon. TREATMENT  Treatment will depend on the severity of your constipation and what is causing it. Some dietary treatments include drinking more fluids and eating more fiber-rich foods. Lifestyle treatments may include regular exercise. If these diet and lifestyle recommendations do not help, your health care provider may recommend taking over-the-counter laxative medicines to help you have bowel movements. Prescription medicines may be prescribed if over-the-counter medicines do not work.   HOME CARE INSTRUCTIONS   Eat foods that have a lot of fiber, such as fruits, vegetables, whole grains, and beans.  Limit foods high in fat and processed sugars, such as french fries, hamburgers, cookies, candies, and soda.   A fiber supplement may be added to your diet if you cannot get enough fiber from foods.   Drink enough fluids to keep your urine clear or pale yellow.   Exercise regularly or as directed by your health care provider.   Go to the restroom when you have the urge to go. Do not hold it.   Only take over-the-counter or prescription medicines as directed by your health care provider. Do not take other medicines for constipation without talking to your health care provider first.  SEEK IMMEDIATE MEDICAL CARE IF:   You have bright red blood in your stool.   Your constipation lasts for more than 4 days or gets worse.   You have abdominal or rectal pain.   You have thin, pencil-like stools.   You have unexplained weight loss. MAKE SURE YOU:   Understand these instructions.  Will watch your condition.  Will get help right away if you are not doing well or get worse.   This information is not intended to replace advice given to you by your health care provider. Make sure you discuss any questions you have with your health care provider.   Document Released: 06/15/2004 Document Revised: 10/08/2014 Document Reviewed: 06/29/2013 Elsevier Interactive Patient Education 2016 Else[employer] Inc.  Fat and Cholesterol Restricted Diet High levels of fat and cholesterol in your blood may lead to various health problems,  such as diseases of the heart, blood vessels, gallbladder, liver, and pancreas. Fats are concentrated sources of energy that come in various forms. Certain types of fat, including saturated fat, may be harmful in excess. Cholesterol is a substance needed by your body in small amounts. Your body makes all the cholesterol it needs. Excess cholesterol comes  from the food you eat. When you have high levels of cholesterol and saturated fat in your blood, health problems can develop because the excess fat and cholesterol will gather along the walls of your blood vessels, causing them to narrow. Choosing the right foods will help you control your intake of fat and cholesterol. This will help keep the levels of these substances in your blood within normal limits and reduce your risk of disease. WHAT IS MY PLAN? Your health care provider recommends that you:  Get no more than __________ % of the total calories in your daily diet from fat.  Limit your intake of saturated fat to less than ______% of your total calories each day.  Limit the amount of cholesterol in your diet to less than _________mg per day. WHAT TYPES OF FAT SHOULD I CHOOSE?  Choose healthy fats more often. Choose monounsaturated and polyunsaturated fats, such as olive and canola oil, flaxseeds, walnuts, almonds, and seeds.  Eat more omega-3 fats. Good choices include salmon, mackerel, sardines, tuna, flaxseed oil, and ground flaxseeds. Aim to eat fish at least two times a week.  Limit saturated fats. Saturated fats are primarily found in animal products, such as meats, butter, and cream. Plant sources of saturated fats include palm oil, palm kernel oil, and coconut oil.  Avoid foods with partially hydrogenated oils in them. These contain trans fats. Examples of foods that contain trans fats are stick margarine, some tub margarines, cookies, crackers, and other baked goods. WHAT GENERAL GUIDELINES DO I NEED TO FOLLOW? These guidelines for healthy eating will help you control your intake of fat and cholesterol:  Check food labels carefully to identify foods with trans fats or high amounts of saturated fat.  Fill one half of your plate with vegetables and green salads.  Fill one fourth of your plate with whole grains. Look for the word "whole" as the first word in the ingredient  list.  Fill one fourth of your plate with lean protein foods.  Limit fruit to two servings a day. Choose fruit instead of juice.  Eat more foods that contain soluble fiber. Examples of foods that contain this type of fiber are apples, broccoli, carrots, beans, peas, and barley. Aim to get 20-30 g of fiber per day.  Eat more home-cooked food and less restaurant, buffet, and fast food.  Limit or avoid alcohol.  Limit foods high in starch and sugar.  Limit fried foods.  Cook foods using methods other than frying. Baking, boiling, grilling, and broiling are all great options.  Lose weight if you are overweight. Losing just 5-10% of your initial body weight can help your overall health and prevent diseases such as diabetes and heart disease. WHAT FOODS CAN I EAT? Grains Whole grains, such as whole wheat or whole grain breads, crackers, cereals, and pasta. Unsweetened oatmeal, bulgur, barley, quinoa, or brown rice. Corn or whole wheat flour tortillas. Vegetables Fresh or frozen vegetables (raw, steamed, roasted, or grilled). Green salads. Fruits All fresh, canned (in natural juice), or frozen fruits. Meat and Other Protein Products Ground beef (85% or leaner), grass-fed beef, or beef trimmed of fat. Skinless chicken or [place].  Ground chicken or [place]. Pork trimmed of fat. All fish and seafood. Eggs. Dried beans, peas, or lentils. Unsalted nuts or seeds. Unsalted canned or dry beans. Dairy Low-fat dairy products, such as skim or 1% milk, 2% or reduced-fat cheeses, low-fat ricotta or cottage cheese, or plain low-fat yogurt. Fats and Oils Tub margarines without trans fats. Light or reduced-fat mayonnaise and salad dressings. Avocado. Olive, canola, sesame, or safflower oils. Natural peanut or almond butter (choose ones without added sugar and oil). The items listed above may not be a complete list of recommended foods or beverages. Contact your dietitian for more options. WHAT FOODS ARE NOT  RECOMMENDED? Grains White bread. White pasta. White rice. Cornbread. Bagels, pastries, and croissants. Crackers that contain trans fat. Vegetables White potatoes. Corn. Creamed or fried vegetables. Vegetables in a cheese sauce. Fruits Dried fruits. Canned fruit in light or heavy syrup. Fruit juice. Meat and Other Protein Products Fatty cuts of meat. Ribs, chicken wings, bacon, sausage, bologna, salami, chitterlings, fatback, hot dogs, bratwurst, and packaged luncheon meats. Liver and organ meats. Dairy Whole or 2% milk, cream, half-and-half, and cream cheese. Whole milk cheeses. Whole-fat or sweetened yogurt. Full-fat cheeses. Nondairy creamers and whipped toppings. Processed cheese, cheese spreads, or cheese curds. Sweets and Desserts Corn syrup, sugars, honey, and molasses. Candy. Jam and jelly. Syrup. Sweetened cereals. Cookies, pies, cakes, donuts, muffins, and ice cream. Fats and Oils Butter, stick margarine, lard, shortening, ghee, or bacon fat. Coconut, palm kernel, or palm oils. Beverages Alcohol. Sweetened drinks (such as sodas, lemonade, and fruit drinks or punches). The items listed above may not be a complete list of foods and beverages to avoid. Contact your dietitian for more information.   This information is not intended to replace advice given to you by your health care provider. Make sure you discuss any questions you have with your health care provider.   Document Released: 09/17/2005 Document Revised: 10/08/2014 Document Reviewed: 12/16/2013 Elsevier Interactive Patient Education [employer]

## 2015-08-15 NOTE — Progress Notes (Signed)
BP 122/84 mmHg  Pulse 63  Temp(Src) 97.9 F (36.6 C)  Ht 5' 1"  (1.549 m)  Wt 162 lb 6.4 oz (73.664 kg)  BMI 30.70 kg/m2  SpO2 99%  LMP 01/02/2010   Subjective:    Patient ID: Julia [name], female    DOB: 09-14-1961, 54 y.o.   MRN: [id number]  HPI: Julia [name] is a 54 y.o. female presenting on 08/15/2015 for Gastroesophageal Reflux   HPI  Chief Complaint  Patient presents with  . Gastroesophageal Reflux    Pt has been referred to GI at [facility] for GERD which is still in process.  (She was declined for [facility] discount x 2) She thinks her heartburn symptoms may be improving a little bit. Says if she doesn't take her medicine bid, it is really bad.  Pt c/o constipation. she states 3-4 times/week.  She takes stool softener at times.  Feels bloated  Pt goes to [facility] for [place] care  Relevant past medical, surgical, family and social history reviewed and updated as indicated. Interim medical history since our last visit reviewed. Allergies and medications reviewed and updated.  Current outpatient prescriptions:  .  calcium carbonate 1250 MG capsule, Take 1,250 mg by mouth 2 (two) times daily with a meal., Disp: , Rfl:  .  cholecalciferol (VITAMIN D) 1000 UNITS tablet, Take 1,000 Units by mouth daily., Disp: , Rfl:  .  FLUoxetine (PROZAC) 40 MG capsule, Take 80 mg by mouth daily., Disp: , Rfl:  .  pantoprazole (PROTONIX) 40 MG tablet, Take 40 mg by mouth 2 (two) times daily., Disp: , Rfl:  .  traZODone (DESYREL) 100 MG tablet, Take 100 mg by mouth at bedtime as needed for sleep., Disp: , Rfl:   Review of Systems  Constitutional: Positive for fatigue. Negative for fever, chills, diaphoresis, appetite change and unexpected weight change.  HENT: Positive for dental problem. Negative for congestion, drooling, ear pain, facial swelling, hearing loss, mouth sores, sneezing, sore throat, trouble swallowing and voice change.   Eyes: Positive for discharge and itching. Negative for  pain, redness and visual disturbance.  Respiratory: Negative for cough, choking, shortness of breath and wheezing.   Cardiovascular: Negative for chest pain, palpitations and leg swelling.  Gastrointestinal: Positive for abdominal pain and constipation. Negative for vomiting, diarrhea and blood in stool.  Endocrine: Positive for cold intolerance and heat intolerance. Negative for polydipsia.  Genitourinary: Negative for dysuria, hematuria and decreased urine volume.  Musculoskeletal: Positive for arthralgias and gait problem. Negative for back pain.  Skin: Negative for rash.  Allergic/Immunologic: Negative for environmental allergies.  Neurological: Positive for light-headedness and headaches. Negative for seizures and syncope.  Hematological: Negative for adenopathy.  Psychiatric/Behavioral: Positive for dysphoric mood and agitation. Negative for suicidal ideas. The patient is nervous/anxious.     Per HPI unless specifically indicated above     Objective:    BP 122/84 mmHg  Pulse 63  Temp(Src) 97.9 F (36.6 C)  Ht 5' 1"  (1.549 m)  Wt 162 lb 6.4 oz (73.664 kg)  BMI 30.70 kg/m2  SpO2 99%  LMP 01/02/2010  Wt Readings from Last 3 Encounters:  08/15/15 162 lb 6.4 oz (73.664 kg)  11/25/13 153 lb (69.4 kg)  11/11/13 150 lb (68.04 kg)    Physical Exam  Constitutional: She is oriented to person, place, and time. She appears well-developed and well-nourished.  HENT:  Head: Normocephalic and atraumatic.  Neck: Neck supple.  Cardiovascular: Normal rate and regular rhythm.   Pulmonary/Chest: Effort  normal and breath sounds normal.  Abdominal: Soft. Bowel sounds are normal. She exhibits no mass. There is no tenderness.  Musculoskeletal: She exhibits no edema.  Lymphadenopathy:    She has no cervical adenopathy.  Neurological: She is alert and oriented to person, place, and time.  Skin: Skin is warm and dry.  Psychiatric: She has a normal mood and affect. Her behavior is normal.   Vitals reviewed.   Results for orders placed or performed in visit on 07/06/15  Comprehensive Metabolic Panel (CMET)  Result Value Ref Range   Sodium 139 135 - 146 mmol/L   Potassium 4.5 3.5 - 5.3 mmol/L   Chloride 106 98 - 110 mmol/L   CO2 28 20 - 31 mmol/L   Glucose, Bld 87 65 - 99 mg/dL   BUN 15 7 - 25 mg/dL   Creat 0.86 0.50 - 1.05 mg/dL   Total Bilirubin 0.5 0.2 - 1.2 mg/dL   Alkaline Phosphatase 45 33 - 130 U/L   AST 14 10 - 35 U/L   ALT 9 6 - 29 U/L   Total Protein 6.7 6.1 - 8.1 g/dL   Albumin 3.9 3.6 - 5.1 g/dL   Calcium 9.0 8.6 - 10.4 mg/dL  TSH  Result Value Ref Range   TSH 1.096 0.350 - 4.500 uIU/mL  HgB A1c  Result Value Ref Range   Hgb A1c MFr Bld 5.5 <5.7 %   Mean Plasma Glucose 111 <117 mg/dL  Vitamin D (25 hydroxy)  Result Value Ref Range   Vit D, 25-Hydroxy 30 30 - 100 ng/mL  CBC w/Diff/Platelet  Result Value Ref Range   WBC 5.9 4.0 - 10.5 K/uL   RBC 4.45 3.87 - 5.11 MIL/uL   Hemoglobin 13.7 12.0 - 15.0 g/dL   HCT 42.3 36.0 - 46.0 %   MCV 95.1 78.0 - 100.0 fL   MCH 30.8 26.0 - 34.0 pg   MCHC 32.4 30.0 - 36.0 g/dL   RDW 13.1 11.5 - 15.5 %   Platelets 235 150 - 400 K/uL   MPV 10.5 8.6 - 12.4 fL   Neutrophils Relative % 71 43 - 77 %   Neutro Abs 4.2 1.7 - 7.7 K/uL   Lymphocytes Relative 18 12 - 46 %   Lymphs Abs 1.1 0.7 - 4.0 K/uL   Monocytes Relative 7 3 - 12 %   Monocytes Absolute 0.4 0.1 - 1.0 K/uL   Eosinophils Relative 3 0 - 5 %   Eosinophils Absolute 0.2 0.0 - 0.7 K/uL   Basophils Relative 1 0 - 1 %   Basophils Absolute 0.1 0.0 - 0.1 K/uL   Smear Review Criteria for review not met   Lipid Profile  Result Value Ref Range   Cholesterol 212 (H) 125 - 200 mg/dL   Triglycerides 138 <150 mg/dL   HDL 44 (L) >=46 mg/dL   Total CHOL/HDL Ratio 4.8 <=5.0 Ratio   VLDL 28 <30 mg/dL   LDL Cholesterol 140 (H) <130 mg/dL      Assessment & Plan:   Encounter Diagnoses  Name Primary?  . Gastroesophageal reflux disease, esophagitis presence not  specified Yes  . Constipation, unspecified constipation type   . Hyperlipemia   . Chronic pain in shoulder, right     -Reviewed labs with pt. They were previously faxed to [facility] working on GI referral to [facility] -counseled on lowfat diet and gave handout. rx lovastatin for the elevated cholesterol -counseled pt on constipation and gave handout. rec she add daily  fiber and increase her water intake -discussed with pt that R shoulder evaluation has been negative including MRI done 01/12/15 which was normal. Rx mobic to use prn pain. Discussed she needed to use caution with this medication in light of her GI issues. She states understanding -F/u OV 3 mo. rto sooner prn

## 2015-11-14 ENCOUNTER — Ambulatory Visit: Payer: Self-pay | Admitting: Physician Assistant

## 2015-11-14 ENCOUNTER — Encounter: Payer: Self-pay | Admitting: Physician Assistant

## 2015-11-14 ENCOUNTER — Other Ambulatory Visit: Payer: Self-pay | Admitting: Physician Assistant

## 2015-11-14 VITALS — BP 120/80 | HR 84 | Temp 97.9°F | Ht 61.0 in | Wt 166.2 lb

## 2015-11-14 DIAGNOSIS — Z1239 Encounter for other screening for malignant neoplasm of breast: Secondary | ICD-10-CM

## 2015-11-14 DIAGNOSIS — R109 Unspecified abdominal pain: Secondary | ICD-10-CM

## 2015-11-14 DIAGNOSIS — K219 Gastro-esophageal reflux disease without esophagitis: Secondary | ICD-10-CM

## 2015-11-14 DIAGNOSIS — E785 Hyperlipidemia, unspecified: Secondary | ICD-10-CM

## 2015-11-14 NOTE — Progress Notes (Signed)
BP 120/80 mmHg  Pulse 84  Temp(Src) 97.9 F (36.6 C)  Ht  (1.549 m)  Wt 166 lb 3.2 oz (75.388 kg)  BMI 31.42 kg/m2  SpO2 99%  LMP 01/02/2010   Subjective:    Patient ID: Julia [name], female    DOB: May 13, 1961, 55 y.o.   MRN: [id number]  HPI: Julia [name] is a 55 y.o. female presenting on 11/14/2015 for Gastroesophageal Reflux and Hyperlipidemia   HPI   Pt submitted financial assistance paperwork for [facility] and she is waiting to hear back from them. She needs to see GI.  Pt still going to [facility] in [place] for MH.   Having tooth pain- needs to see a dentist  Relevant past medical, surgical, family and social history reviewed and updated as indicated. Interim medical history since our last visit reviewed. Allergies and medications reviewed and updated.  Current outpatient prescriptions:  .  ALPRAZolam (XANAX) 0.5 MG tablet, Take 0.5 mg by mouth as needed for anxiety (panic)., Disp: , Rfl:  .  amitriptyline (ELAVIL) 25 MG tablet, Take 25 mg by mouth at bedtime., Disp: , Rfl:  .  Biotin 1000 MCG tablet, Take 1,000 mcg by mouth at bedtime., Disp: , Rfl:  .  calcium carbonate 1250 MG capsule, Take 1,250 mg by mouth 2 (two) times daily with a meal., Disp: , Rfl:  .  cholecalciferol (VITAMIN D) 1000 UNITS tablet, Take 1,000 Units by mouth daily., Disp: , Rfl:  .  FLUoxetine (PROZAC) 40 MG capsule, Take 80 mg by mouth daily., Disp: , Rfl:  .  lovastatin (MEVACOR) 20 MG tablet, Take 1 tablet (20 mg total) by mouth at bedtime., Disp: 30 tablet, Rfl: 4 .  meloxicam (MOBIC) 15 MG tablet, Take 1 tablet (15 mg total) by mouth daily as needed for pain., Disp: 30 tablet, Rfl: 0 .  Omega 3-6-9 Fatty Acids (OMEGA 3-6-9 PO), Take 3,200 mg by mouth daily., Disp: , Rfl:  .  pantoprazole (PROTONIX) 40 MG tablet, Take 40 mg by mouth 2 (two) times daily., Disp: , Rfl:    Review of Systems  Constitutional: Positive for fatigue and unexpected weight change. Negative for fever, chills,  diaphoresis and appetite change.  HENT: Positive for dental problem. Negative for congestion, drooling, ear pain, facial swelling, hearing loss, mouth sores, sneezing, sore throat, trouble swallowing and voice change.   Eyes: Negative for pain, discharge, redness, itching and visual disturbance.  Respiratory: Negative for cough, choking, shortness of breath and wheezing.   Cardiovascular: Negative for chest pain, palpitations and leg swelling.  Gastrointestinal: Positive for abdominal pain and constipation. Negative for vomiting, diarrhea and blood in stool.  Endocrine: Negative for cold intolerance, heat intolerance and polydipsia.  Genitourinary: Negative for dysuria, hematuria and decreased urine volume.  Musculoskeletal: Positive for back pain, arthralgias and gait problem.  Skin: Negative for rash.  Allergic/Immunologic: Negative for environmental allergies.  Neurological: Positive for headaches. Negative for seizures, syncope and light-headedness.  Hematological: Negative for adenopathy.  Psychiatric/Behavioral: Positive for suicidal ideas, dysphoric mood and agitation. The patient is nervous/anxious.     Per HPI unless specifically indicated above     Objective:    BP 120/80 mmHg  Pulse 84  Temp(Src) 97.9 F (36.6 C)  Ht  (1.549 m)  Wt 166 lb 3.2 oz (75.388 kg)  BMI 31.42 kg/m2  SpO2 99%  LMP 01/02/2010  Wt Readings from Last 3 Encounters:  11/14/15 166 lb 3.2 oz (75.388 kg)  08/15/15 162 lb  6.4 oz (73.664 kg)  11/25/13 153 lb (69.4 kg)    Physical Exam  Constitutional: She is oriented to person, place, and time. She appears well-developed and well-nourished.  HENT:  Head: Normocephalic and atraumatic.  Neck: Neck supple.  Cardiovascular: Normal rate and regular rhythm.   Pulmonary/Chest: Effort normal and breath sounds normal.  Abdominal: Soft. Bowel sounds are normal. She exhibits no mass. There is no hepatosplenomegaly. There is generalized tenderness. There  is no rigidity, no rebound, no guarding and no CVA tenderness.  Musculoskeletal: She exhibits no edema.  Lymphadenopathy:    She has no cervical adenopathy.  Neurological: She is alert and oriented to person, place, and time.  Skin: Skin is warm and dry.  Psychiatric: She has a normal mood and affect. Her behavior is normal.  Vitals reviewed.       Assessment & Plan:   Encounter Diagnoses  Name Primary?  . Hyperlipidemia Yes  . Gastroesophageal reflux disease, esophagitis presence not specified   . Abdominal pain, unspecified abdominal location     -continue protonix until she can get in with gastroenterology -Check fasting lipids tomorrow. -will call pt with results -order mammo for after march 14 -Once pt gets in with GI at [facility], will get her to see rheum also (for paget's) since she is unable to see provider in [place] due to no insurance -Continue with [facility] for MH -F/u 3 mo. RTO sooner prn

## 2015-11-18 LAB — COMPLETE METABOLIC PANEL WITH GFR
ALT: 15 U/L (ref 6–29)
AST: 15 U/L (ref 10–35)
Albumin: 4.1 g/dL (ref 3.6–5.1)
Alkaline Phosphatase: 56 U/L (ref 33–130)
BUN: 11 mg/dL (ref 7–25)
CO2: 27 mmol/L (ref 20–31)
Calcium: 9.2 mg/dL (ref 8.6–10.4)
Chloride: 105 mmol/L (ref 98–110)
Creat: 0.98 mg/dL (ref 0.50–1.05)
GFR, Est African American: 76 mL/min (ref >=60)
GFR, Est Non African American: 66 mL/min (ref >=60)
Glucose, Bld: 97 mg/dL (ref 65–99)
Potassium: 4.3 mmol/L (ref 3.5–5.3)
Sodium: 141 mmol/L (ref 135–146)
Total Bilirubin: 0.6 mg/dL (ref 0.2–1.2)
Total Protein: 6.8 g/dL (ref 6.1–8.1)

## 2015-11-18 LAB — LIPID PANEL
Cholesterol: 241 mg/dL — ABNORMAL HIGH (ref 125–200)
HDL: 59 mg/dL (ref >=46)
LDL Cholesterol: 149 mg/dL — ABNORMAL HIGH (ref <130)
Total CHOL/HDL Ratio: 4.1 Ratio (ref <=5.0)
Triglycerides: 165 mg/dL — ABNORMAL HIGH (ref <150)
VLDL: 33 mg/dL — ABNORMAL HIGH (ref <30)

## 2015-11-28 ENCOUNTER — Other Ambulatory Visit: Payer: Self-pay | Admitting: Physician Assistant

## 2015-11-28 MED ORDER — ATORVASTATIN CALCIUM 20 MG PO TABS: 20.0000 mg | ORAL_TABLET | Freq: Every day | ORAL | Status: DC

## 2015-12-03 ENCOUNTER — Emergency Department (HOSPITAL_COMMUNITY): Payer: Self-pay

## 2015-12-03 ENCOUNTER — Emergency Department (HOSPITAL_COMMUNITY)
Admission: EM | Admit: 2015-12-03 | Discharge: 2015-12-03 | Disposition: A | Discharge status: Home / Self Care | Payer: Self-pay | Attending: Emergency Medicine | Admitting: Emergency Medicine

## 2015-12-03 ENCOUNTER — Encounter (HOSPITAL_COMMUNITY): Payer: Self-pay | Admitting: Family Medicine

## 2015-12-03 DIAGNOSIS — M25511 Pain in right shoulder: Secondary | ICD-10-CM | POA: Insufficient documentation

## 2015-12-03 DIAGNOSIS — Z79899 Other long term (current) drug therapy: Secondary | ICD-10-CM | POA: Insufficient documentation

## 2015-12-03 DIAGNOSIS — K029 Dental caries, unspecified: Secondary | ICD-10-CM | POA: Insufficient documentation

## 2015-12-03 DIAGNOSIS — M1990 Unspecified osteoarthritis, unspecified site: Secondary | ICD-10-CM | POA: Insufficient documentation

## 2015-12-03 DIAGNOSIS — G8929 Other chronic pain: Secondary | ICD-10-CM | POA: Insufficient documentation

## 2015-12-03 DIAGNOSIS — K0889 Other specified disorders of teeth and supporting structures: Secondary | ICD-10-CM | POA: Insufficient documentation

## 2015-12-03 DIAGNOSIS — R079 Chest pain, unspecified: Secondary | ICD-10-CM | POA: Insufficient documentation

## 2015-12-03 DIAGNOSIS — Z87891 Personal history of nicotine dependence: Secondary | ICD-10-CM | POA: Insufficient documentation

## 2015-12-03 DIAGNOSIS — M542 Cervicalgia: Secondary | ICD-10-CM | POA: Insufficient documentation

## 2015-12-03 DIAGNOSIS — R6884 Jaw pain: Secondary | ICD-10-CM | POA: Insufficient documentation

## 2015-12-03 LAB — I-STAT TROPONIN, ED: Troponin i, poc: 0 ng/mL (ref 0.00–0.08)

## 2015-12-03 LAB — BASIC METABOLIC PANEL
Anion gap: 13 (ref 5–15)
BUN: 18 mg/dL (ref 6–20)
CO2: 20 mmol/L — ABNORMAL LOW (ref 22–32)
Calcium: 9.5 mg/dL (ref 8.9–10.3)
Chloride: 106 mmol/L (ref 101–111)
Creatinine, Ser: 0.86 mg/dL (ref 0.44–1.00)
GFR calc Af Amer: >60 mL/min (ref >60)
GFR calc non Af Amer: >60 mL/min (ref >60)
Glucose, Bld: 92 mg/dL (ref 65–99)
Potassium: 4.7 mmol/L (ref 3.5–5.1)
Sodium: 139 mmol/L (ref 135–145)

## 2015-12-03 LAB — CBC
HCT: 41.1 % (ref 36.0–46.0)
Hemoglobin: 13.5 g/dL (ref 12.0–15.0)
MCH: 30.8 pg (ref 26.0–34.0)
MCHC: 32.8 g/dL (ref 30.0–36.0)
MCV: 93.6 fL (ref 78.0–100.0)
Platelets: 237 10*3/uL (ref 150–400)
RBC: 4.39 MIL/uL (ref 3.87–5.11)
RDW: 12.7 % (ref 11.5–15.5)
WBC: 6.9 10*3/uL (ref 4.0–10.5)

## 2015-12-03 MED ORDER — HYDROCODONE-ACETAMINOPHEN 5-325 MG PO TABS: 1.0000 | ORAL_TABLET | Freq: Four times a day (QID) | ORAL | Status: DC | PRN

## 2015-12-03 MED ORDER — HYDROCODONE-ACETAMINOPHEN 5-325 MG PO TABS
2.0000 | ORAL_TABLET | Freq: Once | ORAL | Status: AC
  Administered 2015-12-03: 2 via ORAL
  Filled 2015-12-03: qty 2

## 2015-12-03 MED ORDER — DIAZEPAM 5 MG PO TABS
10.0000 mg | ORAL_TABLET | Freq: Once | ORAL | Status: AC
  Administered 2015-12-03: 10 mg via ORAL
  Filled 2015-12-03: qty 2

## 2015-12-03 MED ORDER — ASPIRIN 81 MG PO CHEW
324.0000 mg | CHEWABLE_TABLET | Freq: Once | ORAL | Status: AC
  Administered 2015-12-03: 324 mg via ORAL
  Filled 2015-12-03: qty 4

## 2015-12-03 MED ORDER — PENICILLIN V POTASSIUM 500 MG PO TABS: 500.0000 mg | ORAL_TABLET | Freq: Three times a day (TID) | ORAL | Status: DC

## 2015-12-03 MED ORDER — DIAZEPAM 5 MG PO TABS: 5.0000 mg | ORAL_TABLET | Freq: Two times a day (BID) | ORAL | Status: DC | PRN

## 2015-12-03 NOTE — ED Notes (Signed)
Patient transported to X-ray 

## 2015-12-03 NOTE — ED Notes (Signed)
Pt here for right sided jaw pain radiating into chest and back. sts also neck and ear. sts started a few days ago.

## 2015-12-03 NOTE — ED Provider Notes (Signed)
CSN: [id number]     Arrival date & time 12/03/15  1839 History   First MD Initiated Contact with Patient 12/03/15 1859     Chief Complaint  Patient presents with  . Neck Pain  . Chest Pain     (Consider location/radiation/quality/duration/timing/severity/associated sxs/prior Treatment) The history is provided by the patient and medical records. No language interpreter was used.     Julia [name] is a 55 y.o. female  with a hx of Paget disease, arthritis presents to the Emergency Department complaining of gradual, persistent, progressively worsening left jaw pain that radiates into the right chest onset today.  Pt reports right lower dental pain after breaking a tooth in Aug 2016.  She reports being unable to chew things due to the pain.  She states that the pain has worsened in the last few weeks, radiating into the right neck .  In the last 3 days it has begun to radiate into the right chest. She describes the pain as sharp, intermittent in nature.  She also reports that the right shoulder is painful.  She recently had an MRI for the pain.  She has been taking meloxicam without relief.   Pt reports the pain has caused a worsening of the depression and has been treated at [facility].  Pt denies SI/HI/ or A/V hallucinations.    Pt's husband at bedside reports that when he arrived home today she was rocking in pain and using an ice pack on the jaw without relief.  He reports she has been taking Aleve and Tylenol without relief.  She is being seen at a free clinic in [place] and is on the list for a dentist.    Past Medical History  Diagnosis Date  . Paget disease of bone   . Wears glasses   . Arthritis   . Complication of anesthesia     slow to wake up-does not take much medicine to sedate her   Past Surgical History  Procedure Laterality Date  . Cervical spine surgery  1/13  . Abdominal hysterectomy  8/11    TA[facility]  . Dilation and curettage of uterus  1986  . Tubal ligation  1989  .  Knee arthroscopy Left 07/22/2013    Procedure: LEFT KNEE ARTHROSCOPY WITH DEBRIDEMENT CHONDROPLASTY, REMOVAL LOOSE BODIES, PARTIAL MEDIAL MENISCECTOMY, OPEN EXCISION OF PES GANGLION;  Surgeon: Nestor LewandowskyFrank J Rowan, MD;  Location: [facility];  Service: Orthopedics;  Laterality: Left;  NO SPECIMEN SENT PER DR. Turner DanielsOWAN ORDER.   Family History  Problem Relation Age of Onset  . Thyroid disease Mother   . Congestive Heart Failure Mother   . Heart disease Mother   . Cancer Sister     melanoma  . Healthy Brother   . Healthy Brother   . Healthy Brother   . Healthy Brother   . Heart disease Father    Social History  Substance Use Topics  . Smoking status: Former Smoker    Quit date: 01/02/1985  . Smokeless tobacco: Never Used  . Alcohol Use: Yes     Comment: occassional   OB History    No data available     Review of Systems  Constitutional: Negative for fever, diaphoresis, appetite change, fatigue and unexpected weight change.  HENT: Positive for dental problem. Negative for mouth sores.   Eyes: Negative for visual disturbance.  Respiratory: Negative for cough, chest tightness, shortness of breath and wheezing.   Cardiovascular: Positive for chest pain.  Gastrointestinal: Negative for  nausea, vomiting, abdominal pain, diarrhea and constipation.  Endocrine: Negative for polydipsia, polyphagia and polyuria.  Genitourinary: Negative for dysuria, urgency, frequency and hematuria.  Musculoskeletal: Positive for neck pain. Negative for back pain and neck stiffness.  Skin: Negative for rash.  Allergic/Immunologic: Negative for immunocompromised state.  Neurological: Negative for syncope, light-headedness and headaches.  Hematological: Does not bruise/bleed easily.  Psychiatric/Behavioral: Negative for sleep disturbance. The patient is not nervous/anxious.       Allergies  Lamictal  Home Medications   Prior to Admission medications   Medication Sig Start Date End Date  Taking? Authorizing Provider  ALPRAZolam Prudy Feeler) 0.5 MG tablet Take 0.5 mg by mouth as needed for anxiety (panic).    Historical Provider, MD  amitriptyline (ELAVIL) 25 MG tablet Take 25 mg by mouth at bedtime.    Historical Provider, MD  atorvastatin (LIPITOR) 20 MG tablet Take 1 tablet (20 mg total) by mouth at bedtime. 11/28/15   Jacquelin Hawking, PA-C  Biotin 1000 MCG tablet Take 1,000 mcg by mouth at bedtime.    Historical Provider, MD  calcium carbonate 1250 MG capsule Take 1,250 mg by mouth 2 (two) times daily with a meal.    Historical Provider, MD  cholecalciferol (VITAMIN D) 1000 UNITS tablet Take 1,000 Units by mouth daily.    Historical Provider, MD  diazepam (VALIUM) 5 MG tablet Take 1 tablet (5 mg total) by mouth every 12 (twelve) hours as needed for muscle spasms. 12/03/15   Dion Parrow, PA-C  FLUoxetine (PROZAC) 40 MG capsule Take 80 mg by mouth daily.    Historical Provider, MD  HYDROcodone-acetaminophen (NORCO/VICODIN) 5-325 MG tablet Take 1-2 tablets by mouth every 6 (six) hours as needed for moderate pain or severe pain. 12/03/15   Greco Gastelum, PA-C  lovastatin (MEVACOR) 20 MG tablet Take 1 tablet (20 mg total) by mouth at bedtime. 08/15/15   Jacquelin Hawking, PA-C  meloxicam (MOBIC) 15 MG tablet Take 1 tablet (15 mg total) by mouth daily as needed for pain. 08/15/15   Jacquelin Hawking, PA-C  Omega 3-6-9 Fatty Acids (OMEGA 3-6-9 PO) Take 3,200 mg by mouth daily.    Historical Provider, MD  pantoprazole (PROTONIX) 40 MG tablet Take 40 mg by mouth 2 (two) times daily.    Historical Provider, MD  penicillin v potassium (VEETID) 500 MG tablet Take 1 tablet (500 mg total) by mouth 3 (three) times daily. 12/03/15   Imaan Padgett, PA-C   BP 114/82 mmHg  Pulse 66  Temp(Src) 98.3 F (36.8 C) (Oral)  Resp 16  SpO2 97%  LMP 01/02/2010 Physical Exam  Constitutional: She appears well-developed and well-nourished. No distress.  Awake, alert, nontoxic appearance  HENT:   Head: Normocephalic and atraumatic.  Mouth/Throat: Oropharynx is clear and moist. No oropharyngeal exudate.  Eyes: Conjunctivae are normal. No scleral icterus.  Neck: Normal range of motion. Neck supple.  Cardiovascular: Normal rate, regular rhythm, normal heart sounds and intact distal pulses.   Pulmonary/Chest: Effort normal and breath sounds normal. No respiratory distress. She has no wheezes.  Equal chest expansion  Abdominal: Soft. Bowel sounds are normal. She exhibits no mass. There is no tenderness. There is no rebound and no guarding.  Musculoskeletal: Normal range of motion. She exhibits no edema.  Neurological: She is alert.  Speech is clear and goal oriented Moves extremities without ataxia  Skin: Skin is warm and dry. She is not diaphoretic.  Psychiatric: She has a normal mood and affect.  Nursing note and vitals reviewed.  ED Course  Procedures (including critical care time) Labs Review Labs Reviewed  BASIC METABOLIC PANEL - Abnormal; Notable for the following:    CO2 20 (*)    All other components within normal limits  CBC  I-STAT TROPOININ, ED    Imaging Review Dg Chest 2 View  12/03/2015  CLINICAL DATA:  Chest pain. EXAM: CHEST  2 VIEW COMPARISON:  None. FINDINGS: Midline trachea. Lower cervical spine fixation. Borderline cardiomegaly with a tortuous thoracic aorta. No pleural effusion or pneumothorax. Clear lungs. IMPRESSION: No acute cardiopulmonary disease. Electronically Signed   By: Jeronimo Greaves M.D.   On: 12/03/2015 19:46   I have personally reviewed and evaluated these images and lab results as part of my medical decision-making.   EKG Interpretation   Date/Time:  Saturday December 03 2015 18:47:08 EST Ventricular Rate:  89 PR Interval:  164 QRS Duration: 68 QT Interval:  358 QTC Calculation: 435 R Axis:   77 Text Interpretation:  Normal sinus rhythm Cannot rule out Anterior infarct  , age undetermined Abnormal ECG Confirmed by DELO  MD, DOUGLAS  [phone]) on  12/03/2015 9:26:06 PM    MDM   Final diagnoses:  Pain in lower jaw  Pain due to dental caries  Chronic right shoulder pain  Chest pain, unspecified chest pain type  Neck pain on right side   Julia Kick presents with right lower jaw and dental pain that radiates into the right neck and right sided chest.  Labs reassuring, ECG nonischemic and CXR without acute abnormality.  Pt without cough or signs of sepsis.  Patient given pain control and volume here in the emergency department with complete resolution of her pain. She reports she feels well.  Tooth is broken but does not appear infected. Will give penicillin in case of infection, but does not appear absessed.   Patient is to be discharged with recommendation to follow up with PCP in regards to today's hospital visit. Chest pain is not likely of cardiac or pulmonary etiology d/t presentation, PERC negative, VSS, no tracheal deviation, no JVD or new murmur, RRR, breath sounds equal bilaterally, EKG without acute abnormalities, negative troponin, and negative CXR. Pt has been advised to return to the ED if CP becomes exertional, associated with diaphoresis or nausea, radiates to left jaw/arm, worsens or becomes concerning in any way. Pt appears reliable for follow up and is agreeable to discharge.   Case has been discussed with Dr. Judd Lien who agrees with the above plan to discharge.     Dahlia Client Genevieve Ritzel, PA-C 12/03/15 2142  Geoffery Lyons, MD 12/03/15 2233

## 2015-12-03 NOTE — Discharge Instructions (Signed)
1. Medications: vicodin, valium, penicillin, usual home medications 2. Treatment: rest, drink plenty of fluids, take medications as prescribed 3. Follow Up: Please followup with dentistry within 1 week for discussion of your diagnoses and further evaluation after today's visit; if you do not have a primary care doctor use the resource guide provided to find one; Return to the ER for high fevers, difficulty breathing, difficulty swallowing, worsening chest pain or other concerning symptoms    Nonspecific Chest Pain  Chest pain can be caused by many different conditions. There is always a chance that your pain could be related to something serious, such as a heart attack or a blood clot in your lungs. Chest pain can also be caused by conditions that are not life-threatening. If you have chest pain, it is very important to follow up with your health care provider. CAUSES  Chest pain can be caused by:  Heartburn.  Pneumonia or bronchitis.  Anxiety or stress.  Inflammation around your heart (pericarditis) or lung (pleuritis or pleurisy).  A blood clot in your lung.  A collapsed lung (pneumothorax). It can develop suddenly on its own (spontaneous pneumothorax) or from trauma to the chest.  Shingles infection (varicella-zoster virus).  Heart attack.  Damage to the bones, muscles, and cartilage that make up your chest wall. This can include:  Bruised bones due to injury.  Strained muscles or cartilage due to frequent or repeated coughing or overwork.  Fracture to one or more ribs.  Sore cartilage due to inflammation (costochondritis). RISK FACTORS  Risk factors for chest pain may include:  Activities that increase your risk for trauma or injury to your chest.  Respiratory infections or conditions that cause frequent coughing.  Medical conditions or overeating that can cause heartburn.  Heart disease or family history of heart disease.  Conditions or health behaviors that  increase your risk of developing a blood clot.  Having had chicken pox (varicella zoster). SIGNS AND SYMPTOMS Chest pain can feel like:  Burning or tingling on the surface of your chest or deep in your chest.  Crushing, pressure, aching, or squeezing pain.  Dull or sharp pain that is worse when you move, cough, or take a deep breath.  Pain that is also felt in your back, neck, shoulder, or arm, or pain that spreads to any of these areas. Your chest pain may come and go, or it may stay constant. DIAGNOSIS Lab tests or other studies may be needed to find the cause of your pain. Your health care provider may have you take a test called an ambulatory ECG (electrocardiogram). An ECG records your heartbeat patterns at the time the test is performed. You may also have other tests, such as:  Transthoracic echocardiogram (TTE). During echocardiography, sound waves are used to create a picture of all of the heart structures and to look at how blood flows through your heart.  Transesophageal echocardiogram (TEE).This is a more advanced imaging test that obtains images from inside your body. It allows your health care provider to see your heart in finer detail.  Cardiac monitoring. This allows your health care provider to monitor your heart rate and rhythm in real time.  Holter monitor. This is a portable device that records your heartbeat and can help to diagnose abnormal heartbeats. It allows your health care provider to track your heart activity for several days, if needed.  Stress tests. These can be done through exercise or by taking medicine that makes your heart beat more quickly.  Blood tests.  Imaging tests. TREATMENT  Your treatment depends on what is causing your chest pain. Treatment may include:  Medicines. These may include:  Acid blockers for heartburn.  Anti-inflammatory medicine.  Pain medicine for inflammatory conditions.  Antibiotic medicine, if an infection is  present.  Medicines to dissolve blood clots.  Medicines to treat coronary artery disease.  Supportive care for conditions that do not require medicines. This may include:  Resting.  Applying heat or cold packs to injured areas.  Limiting activities until pain decreases. HOME CARE INSTRUCTIONS  If you were prescribed an antibiotic medicine, finish it all even if you start to feel better.  Avoid any activities that bring on chest pain.  Do not use any tobacco products, including cigarettes, chewing tobacco, or electronic cigarettes. If you need help quitting, ask your health care provider.  Do not drink alcohol.  Take medicines only as directed by your health care provider.  Keep all follow-up visits as directed by your health care provider. This is important. This includes any further testing if your chest pain does not go away.  If heartburn is the cause for your chest pain, you may be told to keep your head raised (elevated) while sleeping. This reduces the chance that acid will go from your stomach into your esophagus.  Make lifestyle changes as directed by your health care provider. These may include:  Getting regular exercise. Ask your health care provider to suggest some activities that are safe for you.  Eating a heart-healthy diet. A registered dietitian can help you to learn healthy eating options.  Maintaining a healthy weight.  Managing diabetes, if necessary.  Reducing stress. SEEK MEDICAL CARE IF:  Your chest pain does not go away after treatment.  You have a rash with blisters on your chest.  You have a fever. SEEK IMMEDIATE MEDICAL CARE IF:   Your chest pain is worse.  You have an increasing cough, or you cough up blood.  You have severe abdominal pain.  You have severe weakness.  You faint.  You have chills.  You have sudden, unexplained chest discomfort.  You have sudden, unexplained discomfort in your arms, back, neck, or jaw.  You  have shortness of breath at any time.  You suddenly start to sweat, or your skin gets clammy.  You feel nauseous or you vomit.  You suddenly feel light-headed or dizzy.  Your heart begins to beat quickly, or it feels like it is skipping beats. These symptoms may represent a serious problem that is an emergency. Do not wait to see if the symptoms will go away. Get medical help right away. Call your local emergency services (911 in the [place] Do not drive yourself to the hospital.   This information is not intended to replace advice given to you by your health care provider. Make sure you discuss any questions you have with your health care provider.   Document Released: 06/27/2005 Document Revised: 10/08/2014 Document Reviewed: 04/23/2014 Elsevier Interactive Patient Education [employer]2016 [employer] Inc.

## 2015-12-03 NOTE — ED Notes (Signed)
Pt returned from xray

## 2015-12-12 ENCOUNTER — Ambulatory Visit: Payer: Self-pay | Admitting: Physician Assistant

## 2015-12-15 ENCOUNTER — Ambulatory Visit (HOSPITAL_COMMUNITY): Payer: Self-pay

## 2015-12-16 ENCOUNTER — Other Ambulatory Visit: Payer: Self-pay | Admitting: Physician Assistant

## 2015-12-16 DIAGNOSIS — Z1239 Encounter for other screening for malignant neoplasm of breast: Secondary | ICD-10-CM

## 2015-12-20 ENCOUNTER — Ambulatory Visit: Payer: Self-pay | Admitting: Physician Assistant

## 2015-12-20 ENCOUNTER — Encounter: Payer: Self-pay | Admitting: Physician Assistant

## 2015-12-20 VITALS — BP 120/76 | HR 67 | Temp 97.7°F | Ht 61.0 in | Wt 168.4 lb

## 2015-12-20 DIAGNOSIS — K0889 Other specified disorders of teeth and supporting structures: Secondary | ICD-10-CM

## 2015-12-20 DIAGNOSIS — M889 Osteitis deformans of unspecified bone: Secondary | ICD-10-CM

## 2015-12-20 MED ORDER — CYCLOBENZAPRINE HCL 10 MG PO TABS: 10.0000 mg | ORAL_TABLET | Freq: Three times a day (TID) | ORAL | Status: DC | PRN

## 2015-12-20 NOTE — Progress Notes (Signed)
BP 120/76 mmHg  Pulse 67  Temp(Src) 97.7 F (36.5 C)  Ht 5\' 1"  (1.549 m)  Wt 168 lb 6.4 oz (76.386 kg)  BMI 31.84 kg/m2  SpO2 99%  LMP 01/02/2010   Subjective:    Patient ID: Julia [name], female    DOB: 09/01/1961, 55 y.o.   MRN: [id number]  HPI: Julia [name] is a 55 y.o. female presenting on 12/20/2015 for Follow-up   HPI  Chief Complaint  Patient presents with  . Follow-up    from ED, due to extreme right facial and neck pain r/t dental pain, has appt with dentist today    Relevant past medical, surgical, family and social history reviewed and updated as indicated. Interim medical history since our last visit reviewed. Allergies and medications reviewed and updated.  Current outpatient prescriptions:  .  ALPRAZolam (XANAX) 0.5 MG tablet, Take 0.5 mg by mouth as needed for anxiety (panic)., Disp: , Rfl:  .  amitriptyline (ELAVIL) 25 MG tablet, Take 25 mg by mouth at bedtime., Disp: , Rfl:  .  atorvastatin (LIPITOR) 20 MG tablet, Take 1 tablet (20 mg total) by mouth at bedtime., Disp: 90 tablet, Rfl: 1 .  Biotin 1000 MCG tablet, Take 1,000 mcg by mouth at bedtime., Disp: , Rfl:  .  FLUoxetine (PROZAC) 40 MG capsule, Take 40 mg by mouth daily. , Disp: , Rfl:  .  meloxicam (MOBIC) 15 MG tablet, Take 1 tablet (15 mg total) by mouth daily as needed for pain., Disp: 30 tablet, Rfl: 0 .  Omega 3-6-9 Fatty Acids (OMEGA 3-6-9 PO), Take 3,200 mg by mouth daily., Disp: , Rfl:  .  pantoprazole (PROTONIX) 40 MG tablet, Take 40 mg by mouth 2 (two) times daily., Disp: , Rfl:  .  acetaminophen (TYLENOL) 500 MG tablet, Take 1,000 mg by mouth every 6 (six) hours as needed for moderate pain., Disp: , Rfl:  .  Calcium-Vitamin D-Vitamin K (CALCIUM SOFT CHEWS PO), Take 1 tablet by mouth 2 (two) times daily., Disp: , Rfl:  .  clindamycin (CLEOCIN) 300 MG capsule, Take 300 mg by mouth 3 (three) times daily., Disp: , Rfl:  .  cyclobenzaprine (FLEXERIL) 10 MG tablet, Take 1 tablet (10 mg  total) by mouth 3 (three) times daily as needed for muscle spasms., Disp: 30 tablet, Rfl: 0 .  naproxen sodium (ANAPROX) 220 MG tablet, Take 220 mg by mouth daily as needed (pain)., Disp: , Rfl:  .  penicillin v potassium (VEETID) 500 MG tablet, Take 500 mg by mouth 3 (three) times daily. Reported on 12/22/2015, Disp: , Rfl: 0    Review of Systems  Constitutional: Positive for fatigue and unexpected weight change. Negative for fever, chills, diaphoresis and appetite change.  HENT: Positive for dental problem and sore throat. Negative for congestion, drooling, ear pain, facial swelling, hearing loss, mouth sores, sneezing, trouble swallowing and voice change.   Eyes: Negative for pain, discharge, redness, itching and visual disturbance.  Respiratory: Negative for cough, choking, shortness of breath and wheezing.   Cardiovascular: Positive for chest pain. Negative for palpitations and leg swelling.  Gastrointestinal: Positive for abdominal pain. Negative for vomiting, diarrhea, constipation and blood in stool.  Endocrine: Positive for cold intolerance and heat intolerance. Negative for polydipsia.  Genitourinary: Negative for dysuria, hematuria and decreased urine volume.  Musculoskeletal: Positive for back pain, arthralgias and gait problem.  Skin: Negative for rash.  Allergic/Immunologic: Negative for environmental allergies.  Neurological: Positive for headaches. Negative for seizures, syncope  and light-headedness.  Hematological: Negative for adenopathy.  Psychiatric/Behavioral: Positive for dysphoric mood and agitation. The patient is nervous/anxious.     Per HPI unless specifically indicated above     Objective:    BP 120/76 mmHg  Pulse 67  Temp(Src) 97.7 F (36.5 C)  Ht  (1.549 m)  Wt 168 lb 6.4 oz (76.386 kg)  BMI 31.84 kg/m2  SpO2 99%  LMP 01/02/2010  Wt Readings from Last 3 Encounters:  12/20/15 168 lb 6.4 oz (76.386 kg)  11/14/15 166 lb 3.2 oz (75.388 kg)   08/15/15 162 lb 6.4 oz (73.664 kg)    Physical Exam  Constitutional: She is oriented to person, place, and time. She appears well-developed and well-nourished.  HENT:  Head: Normocephalic and atraumatic.  Face without swelling  Neck: Neck supple.  Cardiovascular: Normal rate and regular rhythm.   Pulmonary/Chest: Effort normal and breath sounds normal.  Abdominal: Soft. Bowel sounds are normal. She exhibits no mass. There is no hepatosplenomegaly. There is no tenderness.  Musculoskeletal: She exhibits no edema.  Lymphadenopathy:    She has no cervical adenopathy.  Neurological: She is alert and oriented to person, place, and time.  Skin: Skin is warm and dry.  Psychiatric: She has a normal mood and affect. Her behavior is normal.  Vitals reviewed.       Assessment & Plan:   Encounter Diagnoses  Name Primary?  Norva Riffle Yes  . Paget's disease of bone     -See dentist today as scheduled -rx flexeril. Counseled no driving on rx due to sedation -F/u may as scheduled -g[facility]entology at [facility] as scheduled -Will refer to rheumatology now that she has completed pt assistance at [facility] -continue with [facility] for MH issues

## 2015-12-22 ENCOUNTER — Emergency Department (HOSPITAL_COMMUNITY)
Admission: EM | Admit: 2015-12-22 | Discharge: 2015-12-22 | Disposition: A | Discharge status: Home / Self Care | Payer: Self-pay | Attending: Emergency Medicine | Admitting: Emergency Medicine

## 2015-12-22 ENCOUNTER — Other Ambulatory Visit: Payer: Self-pay

## 2015-12-22 ENCOUNTER — Encounter (HOSPITAL_COMMUNITY): Payer: Self-pay | Admitting: Emergency Medicine

## 2015-12-22 ENCOUNTER — Ambulatory Visit (HOSPITAL_COMMUNITY): Payer: Self-pay

## 2015-12-22 ENCOUNTER — Emergency Department (HOSPITAL_COMMUNITY): Payer: Self-pay

## 2015-12-22 ENCOUNTER — Ambulatory Visit (HOSPITAL_COMMUNITY)
Admission: RE | Admit: 2015-12-22 | Discharge: 2015-12-22 | Disposition: A | Discharge status: Home / Self Care | Payer: Self-pay | Source: Ambulatory Visit | Attending: Physician Assistant | Admitting: Physician Assistant

## 2015-12-22 DIAGNOSIS — M542 Cervicalgia: Secondary | ICD-10-CM | POA: Insufficient documentation

## 2015-12-22 DIAGNOSIS — Z79899 Other long term (current) drug therapy: Secondary | ICD-10-CM | POA: Insufficient documentation

## 2015-12-22 DIAGNOSIS — R55 Syncope and collapse: Secondary | ICD-10-CM | POA: Insufficient documentation

## 2015-12-22 DIAGNOSIS — M2550 Pain in unspecified joint: Secondary | ICD-10-CM | POA: Insufficient documentation

## 2015-12-22 DIAGNOSIS — M1990 Unspecified osteoarthritis, unspecified site: Secondary | ICD-10-CM | POA: Insufficient documentation

## 2015-12-22 DIAGNOSIS — Z87891 Personal history of nicotine dependence: Secondary | ICD-10-CM | POA: Insufficient documentation

## 2015-12-22 DIAGNOSIS — Z1239 Encounter for other screening for malignant neoplasm of breast: Secondary | ICD-10-CM

## 2015-12-22 LAB — BASIC METABOLIC PANEL
Anion gap: 6 (ref 5–15)
BUN: 14 mg/dL (ref 6–20)
CO2: 24 mmol/L (ref 22–32)
Calcium: 9.5 mg/dL (ref 8.9–10.3)
Chloride: 105 mmol/L (ref 101–111)
Creatinine, Ser: 0.86 mg/dL (ref 0.44–1.00)
GFR calc Af Amer: >60 mL/min (ref >60)
GFR calc non Af Amer: >60 mL/min (ref >60)
Glucose, Bld: 103 mg/dL — ABNORMAL HIGH (ref 65–99)
Potassium: 4.3 mmol/L (ref 3.5–5.1)
Sodium: 135 mmol/L (ref 135–145)

## 2015-12-22 LAB — CBC
HCT: 41.7 % (ref 36.0–46.0)
Hemoglobin: 13.9 g/dL (ref 12.0–15.0)
MCH: 31.7 pg (ref 26.0–34.0)
MCHC: 33.3 g/dL (ref 30.0–36.0)
MCV: 95 fL (ref 78.0–100.0)
Platelets: 243 10*3/uL (ref 150–400)
RBC: 4.39 MIL/uL (ref 3.87–5.11)
RDW: 13.1 % (ref 11.5–15.5)
WBC: 6.4 10*3/uL (ref 4.0–10.5)

## 2015-12-22 LAB — TROPONIN I: Troponin I: <0.03 ng/mL (ref <0.031)

## 2015-12-22 NOTE — ED Provider Notes (Signed)
CSN: [id number]     Arrival date & time 12/22/15  1114 History  By signing my name below, I, Tanda RockersMargaux Venter, attest that this documentation has been prepared under the direction and in the presence of Mancel BaleElliott Anmol Paschen, MD. Electronically Signed: Tanda RockersMargaux Venter, ED Scribe. 12/22/2015. 12:23 PM.   Chief Complaint  Patient presents with  . Chest Pain   The history is provided by the patient. No language interpreter was used.     HPI Comments: Julia [name] is a 55 y.o. female who presents to the Emergency Department complaining of sudden onset, constant, shortness of breath that began earlier today. Pt states she was scheduled to have a mammogram this morning and prior to having the imaging done she began feel dizzy and pre syncopal with the shortness of breath. She also complains of constant neck pain radiating to substernal chest for the past month. She was seen in the ED on 03/04 for this pain as well as dental pain. She was placed on Penicillin for the dental pain and told to follow up with her PCP. Pt was [facility] at [facility] for the follow up and was placed on Flexeril after being diagnosed with muscle spasms. Pt also saw her dentist this week and had an x ray which showed a dental abscess. She is currently taking Clindamycin. She also complains of left shoulder blade pain and left knee pain. Pt has had an MRI done in the past for her left shoulder. Denies any other associated symptoms.   Past Medical History  Diagnosis Date  . Paget disease of bone   . Wears glasses   . Arthritis   . Complication of anesthesia     slow to wake up-does not take much medicine to sedate her   Past Surgical History  Procedure Laterality Date  . Cervical spine surgery  1/13  . Abdominal hysterectomy  8/11    TA[facility]  . Dilation and curettage of uterus  1986  . Tubal ligation  1989  . Knee arthroscopy Left 07/22/2013    Procedure: LEFT KNEE ARTHROSCOPY WITH DEBRIDEMENT CHONDROPLASTY, REMOVAL  LOOSE BODIES, PARTIAL MEDIAL MENISCECTOMY, OPEN EXCISION OF PES GANGLION;  Surgeon: Nestor LewandowskyFrank J Rowan, MD;  Location: [facility];  Service: Orthopedics;  Laterality: Left;  NO SPECIMEN SENT PER DR. Turner DanielsOWAN ORDER.   Family History  Problem Relation Age of Onset  . Thyroid disease Mother   . Congestive Heart Failure Mother   . Heart disease Mother   . Cancer Sister     melanoma  . Healthy Brother   . Healthy Brother   . Healthy Brother   . Healthy Brother   . Heart disease Father    Social History  Substance Use Topics  . Smoking status: Former Smoker    Quit date: 01/02/1985  . Smokeless tobacco: Never Used  . Alcohol Use: Yes     Comment: occassional   OB History    No data available     Review of Systems  Respiratory: Positive for shortness of breath.   Cardiovascular: Positive for chest pain.  Musculoskeletal: Positive for arthralgias and neck pain.  Neurological: Positive for dizziness. Negative for syncope.  All other systems reviewed and are negative.     Allergies  Lamictal  Home Medications   Prior to Admission medications   Medication Sig Start Date End Date Taking? Authorizing Provider  acetaminophen (TYLENOL) 500 MG tablet Take 1,000 mg by mouth every 6 (six) hours as needed  for moderate pain.   Yes Historical Provider, MD  ALPRAZolam Prudy Feeler) 0.5 MG tablet Take 0.5 mg by mouth as needed for anxiety (panic).   Yes Historical Provider, MD  amitriptyline (ELAVIL) 25 MG tablet Take 25 mg by mouth at bedtime.   Yes Historical Provider, MD  atorvastatin (LIPITOR) 20 MG tablet Take 1 tablet (20 mg total) by mouth at bedtime. 11/28/15  Yes Jacquelin Hawking, PA-C  Biotin 1000 MCG tablet Take 1,000 mcg by mouth at bedtime.   Yes Historical Provider, MD  Calcium-Vitamin D-Vitamin K (CALCIUM SOFT CHEWS PO) Take 1 tablet by mouth 2 (two) times daily.   Yes Historical Provider, MD  clindamycin (CLEOCIN) 300 MG capsule Take 300 mg by mouth 3 (three) times daily.    Yes Historical Provider, MD  cyclobenzaprine (FLEXERIL) 10 MG tablet Take 1 tablet (10 mg total) by mouth 3 (three) times daily as needed for muscle spasms. 12/20/15  Yes Jacquelin Hawking, PA-C  FLUoxetine (PROZAC) 40 MG capsule Take 40 mg by mouth daily.    Yes Historical Provider, MD  meloxicam (MOBIC) 15 MG tablet Take 1 tablet (15 mg total) by mouth daily as needed for pain. 08/15/15  Yes Jacquelin Hawking, PA-C  naproxen sodium (ANAPROX) 220 MG tablet Take 220 mg by mouth daily as needed (pain).   Yes Historical Provider, MD  Omega 3-6-9 Fatty Acids (OMEGA 3-6-9 PO) Take 3,200 mg by mouth daily.   Yes Historical Provider, MD  pantoprazole (PROTONIX) 40 MG tablet Take 40 mg by mouth 2 (two) times daily.   Yes Historical Provider, MD  penicillin v potassium (VEETID) 500 MG tablet Take 500 mg by mouth 3 (three) times daily. Reported on 12/22/2015 12/04/15   Historical Provider, MD   BP 107/97 mmHg  Pulse 69  Temp(Src) 97.8 F (36.6 C) (Oral)  Resp 18  Ht  (1.549 m)  Wt 168 lb (76.204 kg)  BMI 31.76 kg/m2  SpO2 95%  LMP 01/02/2010   Physical Exam  Constitutional: She is oriented to person, place, and time. She appears well-developed and well-nourished.  HENT:  Head: Normocephalic and atraumatic.  No trismus.  Scattered dental cavities.   Eyes: Conjunctivae and EOM are normal. Pupils are equal, round, and reactive to light.  Neck: Normal range of motion and phonation normal. Neck supple.  Cardiovascular: Normal rate and regular rhythm.   Pulmonary/Chest: Effort normal and breath sounds normal. She exhibits no tenderness.  Abdominal: Soft. She exhibits no distension. There is no tenderness. There is no guarding.  Musculoskeletal: Normal range of motion.  Neurological: She is alert and oriented to person, place, and time. She exhibits normal muscle tone.  Skin: Skin is warm and dry.  Psychiatric: She has a normal mood and affect. Her behavior is normal. Judgment and thought content  normal.  Nursing note and vitals reviewed.   ED Course  Procedures (including critical care time)  DIAGNOSTIC STUDIES: Oxygen Saturation is 99% on RA, normal by my interpretation.    COORDINATION OF CARE: 12:20 PM-Discussed treatment plan which includes with pt at bedside and pt agreed to plan.   Medications - No data to display  Patient Vitals for the past 24 hrs:  BP Temp Temp src Pulse Resp SpO2 Height Weight  12/22/15 1602 107/97 mmHg - - 69 18 95 % - -  12/22/15 1530 107/91 mmHg - - 70 18 95 % - -  12/22/15 1430 117/88 mmHg - - 72 20 97 % - -  12/22/15 1400 111/74  mmHg - - 62 19 99 % - -  12/22/15 1345 - - - 64 15 97 % - -  12/22/15 1330 114/74 mmHg - - (!) 57 16 97 % - -  12/22/15 1300 121/76 mmHg - - (!) 58 24 100 % - -  12/22/15 1245 - - - (!) 56 20 100 % - -  12/22/15 1230 114/81 mmHg - - 66 20 99 % - -  12/22/15 1200 107/82 mmHg - - 62 22 99 % - -  12/22/15 1145 - - - 68 20 99 % - -  12/22/15 1130 113/83 mmHg - - 69 24 99 % - -  12/22/15 1121 - 97.8 F (36.6 C) Oral - - - - -  12/22/15 1118 109/76 mmHg - - 70 16 100 %  (1.549 m) 168 lb (76.204 kg)    Labs Review Labs Reviewed  BASIC METABOLIC PANEL - Abnormal; Notable for the following:    Glucose, Bld 103 (*)    All other components within normal limits  CBC  TROPONIN I    Imaging Review Dg Chest 2 View  12/22/2015  CLINICAL DATA:  Chest pain and shortness of Breath EXAM: CHEST  2 VIEW COMPARISON:  12/03/2015 FINDINGS: The heart size and mediastinal contours are within normal limits. Both lungs are clear. The visualized skeletal structures are unremarkable. IMPRESSION: No active cardiopulmonary disease. Electronically Signed   By: Alcide Clever M.D.   On: 12/22/2015 12:15   I have personally reviewed and evaluated these images and lab results as part of my medical decision-making.   EKG Interpretation   Date/Time:  Thursday December 22 2015 11:18:57 EDT Ventricular Rate:  69 PR Interval:  180 QRS  Duration: 70 QT Interval:  398 QTC Calculation: 426 R Axis:   52 Text Interpretation:  Normal sinus rhythm Low voltage QRS Cannot rule out  Anterior infarct , age undetermined Abnormal ECG since last tracing no  significant change Confirmed by Effie Shy  MD, Chayce Robbins [phone]) on 12/22/2015  4:46:13 PM      MDM   Final diagnoses:  Near syncope    Near-syncope following mammogram, likely related to pain. No evidence for acute cardiac, pulmonary or infectious processes. She is PERC negative, doubt PE. No evidence for ACS, metabolic instability or impending vascular collapse.  Nursing Notes Reviewed/ Care Coordinated Applicable Imaging Reviewed Interpretation of Laboratory Data incorporated into ED treatment  The patient appears reasonably screened and/or stabilized for discharge and I doubt any other medical condition or other [facility] requiring further screening, evaluation, or treatment in the ED at this time prior to discharge.  Plan: Home Medications- usual; Home Treatments- rest; return here if the recommended treatment, does not improve the symptoms; Recommended follow up- PCp prn   I personally performed the services described in this documentation, which was scribed in my presence. The recorded information has been reviewed and is accurate.        Mancel Bale, MD 12/22/15 2253

## 2015-12-22 NOTE — Discharge Instructions (Signed)
There is no clear cause for your weak and dizzy feeling. It may be related to the pain of the mammogram procedure. It is important to get plenty of rest, drink a lot of fluids and use Tylenol if needed for pain.    Near-Syncope Near-syncope (commonly known as near fainting) is sudden weakness, dizziness, or feeling like you might pass out. During an episode of near-syncope, you may also develop pale skin, have tunnel vision, or feel sick to your stomach (nauseous). Near-syncope may occur when getting up after sitting or while standing for a long time. It is caused by a sudden decrease in blood flow to the brain. This decrease can result from various causes or triggers, most of which are not serious. However, because near-syncope can sometimes be a sign of something serious, a medical evaluation is required. The specific cause is often not determined. HOME CARE INSTRUCTIONS  Monitor your condition for any changes. The following actions may help to alleviate any discomfort you are experiencing:  Have someone stay with you until you feel stable.  Lie down right away and prop your feet up if you start feeling like you might faint. Breathe deeply and steadily. Wait until all the symptoms have passed. Most of these episodes last only a few minutes. You may feel tired for several hours.   Drink enough fluids to keep your urine clear or pale yellow.   If you are taking blood pressure or heart medicine, get up slowly when seated or lying down. Take several minutes to sit and then stand. This can reduce dizziness.  Follow up with your health care provider as directed. SEEK IMMEDIATE MEDICAL CARE IF:   You have a severe headache.   You have unusual pain in the chest, abdomen, or back.   You are bleeding from the mouth or rectum, or you have black or tarry stool.   You have an irregular or very fast heartbeat.   You have repeated fainting or have seizure-like jerking during an episode.    You faint when sitting or lying down.   You have confusion.   You have difficulty walking.   You have severe weakness.   You have vision problems.  MAKE SURE YOU:   Understand these instructions.  Will watch your condition.  Will get help right away if you are not doing well or get worse.   This information is not intended to replace advice given to you by your health care provider. Make sure you discuss any questions you have with your health care provider.   Document Released: 09/17/2005 Document Revised: 09/22/2013 Document Reviewed: 02/20/2013 Elsevier Interactive Patient Education [employer]2016 Elsevier Inc.

## 2015-12-22 NOTE — ED Notes (Signed)
Pt scheduled for mammogram this morning, pt reports that she felt like she was going to pass out after mammogram.  Pt has been taking clindamycin for abscess in mouth.  Pt is sob today and has right sided chest pain with no radiation.

## 2015-12-22 NOTE — ED Notes (Signed)
PT ambulated to the bathroom at this time with NAD noted.  

## 2015-12-28 DIAGNOSIS — M889 Osteitis deformans of unspecified bone: Secondary | ICD-10-CM | POA: Insufficient documentation

## 2015-12-29 ENCOUNTER — Other Ambulatory Visit: Payer: Self-pay | Admitting: Physician Assistant

## 2015-12-29 MED ORDER — MELOXICAM 15 MG PO TABS
15.0000 mg | ORAL_TABLET | Freq: Every day | ORAL | Status: DC | PRN
Start: 2015-12-29 — End: 2016-05-27

## 2016-01-31 ENCOUNTER — Other Ambulatory Visit: Payer: Self-pay

## 2016-01-31 DIAGNOSIS — E785 Hyperlipidemia, unspecified: Secondary | ICD-10-CM

## 2016-02-02 LAB — COMPLETE METABOLIC PANEL WITH GFR
ALT: 12 U/L (ref 6–29)
AST: 15 U/L (ref 10–35)
Albumin: 4 g/dL (ref 3.6–5.1)
Alkaline Phosphatase: 47 U/L (ref 33–130)
BUN: 15 mg/dL (ref 7–25)
CO2: 23 mmol/L (ref 20–31)
Calcium: 9.1 mg/dL (ref 8.6–10.4)
Chloride: 103 mmol/L (ref 98–110)
Creat: 0.77 mg/dL (ref 0.50–1.05)
GFR, Est African American: >89 mL/min (ref >=60)
GFR, Est Non African American: 88 mL/min (ref >=60)
Glucose, Bld: 97 mg/dL (ref 65–99)
Potassium: 4.4 mmol/L (ref 3.5–5.3)
Sodium: 138 mmol/L (ref 135–146)
Total Bilirubin: 0.5 mg/dL (ref 0.2–1.2)
Total Protein: 6.4 g/dL (ref 6.1–8.1)

## 2016-02-02 LAB — LIPID PANEL
Cholesterol: 137 mg/dL (ref 125–200)
HDL: 62 mg/dL (ref >=46)
LDL Cholesterol: 59 mg/dL (ref <130)
Total CHOL/HDL Ratio: 2.2 Ratio (ref <=5.0)
Triglycerides: 78 mg/dL (ref <150)
VLDL: 16 mg/dL (ref <30)

## 2016-02-09 ENCOUNTER — Ambulatory Visit: Payer: Self-pay | Admitting: Physician Assistant

## 2016-02-09 ENCOUNTER — Encounter: Payer: Self-pay | Admitting: Physician Assistant

## 2016-02-09 VITALS — BP 118/76 | HR 77 | Temp 97.9°F | Ht 61.0 in | Wt 171.8 lb

## 2016-02-09 DIAGNOSIS — F32A Depression, unspecified: Secondary | ICD-10-CM

## 2016-02-09 DIAGNOSIS — K219 Gastro-esophageal reflux disease without esophagitis: Secondary | ICD-10-CM

## 2016-02-09 DIAGNOSIS — E785 Hyperlipidemia, unspecified: Secondary | ICD-10-CM

## 2016-02-09 DIAGNOSIS — G8929 Other chronic pain: Secondary | ICD-10-CM

## 2016-02-09 DIAGNOSIS — F329 Major depressive disorder, single episode, unspecified: Secondary | ICD-10-CM

## 2016-02-09 DIAGNOSIS — M25562 Pain in left knee: Secondary | ICD-10-CM

## 2016-02-09 NOTE — Progress Notes (Signed)
BP 118/76 mmHg  Pulse 77  Temp(Src) 97.9 F (36.6 C)  Ht 5\' 1"  (1.549 m)  Wt 171 lb 12.8 oz (77.928 kg)  BMI 32.48 kg/m2  SpO2 98%  LMP 01/02/2010   Subjective:    Patient ID: Julia [name], female    DOB: 09/14/1961, 55 y.o.   MRN: [id number]  HPI: Julia [name] is a 55 y.o. female presenting on 02/09/2016 for Hyperlipidemia and Gastroesophageal Reflux   HPI   -pt c/o left knee pain, sometimes throbbing, sometimes burning, mostly on inner aspect of knee, hurts to walk, sometimes feels like it's going to give out  -Pt [facility].  She says she had bone scan.  -Pt [facility] -Pt goes to [facility] for MH -Pt states she is stressed- her sister just dx with lung CA (she is a smoker)  -reviewed Bone scan- 01/09/16- shows degenerative changes L knee  Relevant past medical, surgical, family and social history reviewed and updated as indicated. Interim medical history since our last visit reviewed. Allergies and medications reviewed and updated.   Current outpatient prescriptions:  .  ALPRAZolam (XANAX) 0.5 MG tablet, Take 0.5 mg by mouth as needed for anxiety (panic)., Disp: , Rfl:  .  amitriptyline (ELAVIL) 25 MG tablet, Take 25 mg by mouth at bedtime., Disp: , Rfl:  .  atorvastatin (LIPITOR) 20 MG tablet, Take 1 tablet (20 mg total) by mouth at bedtime., Disp: 90 tablet, Rfl: 1 .  Biotin 1000 MCG tablet, Take 1,000 mcg by mouth at bedtime., Disp: , Rfl:  .  Calcium-Vitamin D-Vitamin K (CALCIUM SOFT CHEWS PO), Take 1 tablet by mouth 2 (two) times daily., Disp: , Rfl:  .  FLUoxetine (PROZAC) 40 MG capsule, Take 40 mg by mouth daily. , Disp: , Rfl:  .  meloxicam (MOBIC) 15 MG tablet, Take 1 tablet (15 mg total) by mouth daily as needed for pain., Disp: 30 tablet, Rfl: 3 .  naproxen sodium (ANAPROX) 220 MG tablet, Take 220 mg by mouth daily as needed (pain)., Disp: , Rfl:  .  Omega 3-6-9 Fatty Acids (OMEGA 3-6-9 PO), Take 3,200 mg  by mouth daily., Disp: , Rfl:  .  pantoprazole (PROTONIX) 40 MG tablet, Take 40 mg by mouth 2 (two) times daily., Disp: , Rfl:    Review of Systems  Constitutional: Positive for diaphoresis and fatigue. Negative for fever, chills, appetite change and unexpected weight change.  HENT: Positive for dental problem. Negative for ear pain, sneezing and sore throat.   Eyes: Positive for itching.  Respiratory: Negative for cough, shortness of breath and wheezing.   Cardiovascular: Negative for chest pain and palpitations.  Gastrointestinal: Positive for constipation. Negative for vomiting, diarrhea and blood in stool.  Endocrine: Positive for cold intolerance and heat intolerance. Negative for polydipsia.  Genitourinary: Negative for dysuria and hematuria.  Musculoskeletal: Positive for back pain, arthralgias and gait problem.  Allergic/Immunologic: Negative for environmental allergies.  Neurological: Positive for headaches. Negative for syncope.  Hematological: Negative for adenopathy.  Psychiatric/Behavioral: Positive for dysphoric mood.    Per HPI unless specifically indicated above     Objective:    BP 118/76 mmHg  Pulse 77  Temp(Src) 97.9 F (36.6 C)  Ht 5\' 1"  (1.549 m)  Wt 171 lb 12.8 oz (77.928 kg)  BMI 32.48 kg/m2  SpO2 98%  LMP 01/02/2010  Wt Readings from Last 3 Encounters:  02/09/16 171 lb 12.8 oz (77.928  kg)  12/22/15 168 lb (76.204 kg)  12/20/15 168 lb 6.4 oz (76.386 kg)    Physical Exam  Constitutional: She is oriented to person, place, and time. She appears well-developed and well-nourished.  HENT:  Head: Normocephalic and atraumatic.  Neck: Neck supple.  Cardiovascular: Normal rate and regular rhythm.   Pulmonary/Chest: Effort normal and breath sounds normal.  Abdominal: Soft. Bowel sounds are normal. She exhibits no mass. There is no hepatosplenomegaly. There is no tenderness.  Musculoskeletal: She exhibits no edema.  Lymphadenopathy:    She has no cervical  adenopathy.  Neurological: She is alert and oriented to person, place, and time.  Skin: Skin is warm and dry.  Psychiatric: She has a normal mood and affect. Her behavior is normal.  Vitals reviewed.   Results for orders placed or performed in visit on 01/31/16  COMPLETE METABOLIC PANEL WITH GFR  Result Value Ref Range   Sodium 138 135 - 146 mmol/L   Potassium 4.4 3.5 - 5.3 mmol/L   Chloride 103 98 - 110 mmol/L   CO2 23 20 - 31 mmol/L   Glucose, Bld 97 65 - 99 mg/dL   BUN 15 7 - 25 mg/dL   Creat 1.61 0.96 - 0.45 mg/dL   Total Bilirubin 0.5 0.2 - 1.2 mg/dL   Alkaline Phosphatase 47 33 - 130 U/L   AST 15 10 - 35 U/L   ALT 12 6 - 29 U/L   Total Protein 6.4 6.1 - 8.1 g/dL   Albumin 4.0 3.6 - 5.1 g/dL   Calcium 9.1 8.6 - 40.9 mg/dL   GFR, Est African American >89 >=60 mL/min   GFR, Est Non African American 88 >=60 mL/min  Lipid Profile  Result Value Ref Range   Cholesterol 137 125 - 200 mg/dL   Triglycerides 78 <811 mg/dL   HDL 62 >=91 mg/dL   Total CHOL/HDL Ratio 2.2 <=5.0 Ratio   VLDL 16 <30 mg/dL   LDL Cholesterol 59 <478 mg/dL      Assessment & Plan:   Encounter Diagnoses  Name Primary?  . Hyperlipidemia Yes  . Gastroesophageal reflux disease, esophagitis presence not specified   . Depression   . Chronic pain   . Left knee pain     -Pt counseled to not take mobic and naproxen simultaneously -Continue lipitor for cholesterol -pt wanted her knee examined but she was unwilling to wait so she left -f/u 3 months

## 2016-02-09 NOTE — Patient Instructions (Signed)
Do not take naproxen if taking meloxicam Continue atorvastatin and low-fat diet for cholesterol

## 2016-02-10 DIAGNOSIS — G8929 Other chronic pain: Secondary | ICD-10-CM | POA: Insufficient documentation

## 2016-02-16 ENCOUNTER — Ambulatory Visit: Payer: Self-pay | Admitting: Physician Assistant

## 2016-02-28 ENCOUNTER — Encounter: Payer: Self-pay | Admitting: Physician Assistant

## 2016-02-28 ENCOUNTER — Ambulatory Visit: Payer: Self-pay | Admitting: Physician Assistant

## 2016-02-28 VITALS — BP 110/76 | HR 72 | Temp 97.9°F | Ht 61.0 in | Wt 168.8 lb

## 2016-02-28 DIAGNOSIS — J069 Acute upper respiratory infection, unspecified: Secondary | ICD-10-CM

## 2016-02-28 MED ORDER — PSEUDOEPH-BROMPHEN-DM 30-2-10 MG/5ML PO SYRP: 5.0000 mL | ORAL_SOLUTION | Freq: Four times a day (QID) | ORAL | Status: DC | PRN

## 2016-02-28 NOTE — Progress Notes (Signed)
BP 110/76 mmHg  Pulse 72  Temp(Src) 97.9 F (36.6 C)  Ht 5\' 1"  (1.549 m)  Wt 168 lb 12.8 oz (76.567 kg)  BMI 31.91 kg/m2  SpO2 98%  LMP 01/02/2010   Subjective:    Patient ID: Julia [name], female    DOB: 03/06/1961, 55 y.o.   MRN: [id number]  HPI: Julia [name] is a 55 y.o. female presenting on 02/28/2016 for Sinus Problem   HPI Chief Complaint  Patient presents with  . Sinus Problem    sx for 3 weeks sx consist of sore throat, coughing, sneezing, hoarseness, pressure in ears. pt has used flonase and cold and cough meds. have not been helpful    Pt started using flonase about 4 days ago.  She used riccola drops and coricidin.  She used it 2 or 3 times.    Pt is tearful.  Says her sister is staying with her- she just started her first round of chemotherapy.  Pt continues with [facility] for MH issues  Relevant past medical, surgical, family and social history reviewed and updated as indicated. Interim medical history since our last visit reviewed. Allergies and medications reviewed and updated.   Current outpatient prescriptions:  .  ALPRAZolam (XANAX) 0.5 MG tablet, Take 0.5 mg by mouth as needed for anxiety (panic)., Disp: , Rfl:  .  amitriptyline (ELAVIL) 25 MG tablet, Take 25 mg by mouth at bedtime., Disp: , Rfl:  .  atorvastatin (LIPITOR) 20 MG tablet, Take 1 tablet (20 mg total) by mouth at bedtime., Disp: 90 tablet, Rfl: 1 .  Biotin 1000 MCG tablet, Take 1,000 mcg by mouth at bedtime., Disp: , Rfl:  .  Calcium-Vitamin D-Vitamin K (CALCIUM SOFT CHEWS PO), Take 1 tablet by mouth 2 (two) times daily., Disp: , Rfl:  .  FLUoxetine (PROZAC) 40 MG capsule, Take 60 mg by mouth daily. , Disp: , Rfl:  .  meloxicam (MOBIC) 15 MG tablet, Take 1 tablet (15 mg total) by mouth daily as needed for pain., Disp: 30 tablet, Rfl: 3 .  Omega 3-6-9 Fatty Acids (OMEGA 3-6-9 PO), Take 3,200 mg by mouth daily., Disp: , Rfl:  .  pantoprazole (PROTONIX) 40 MG tablet, Take 40 mg by mouth 2  (two) times daily., Disp: , Rfl:  .  naproxen sodium (ANAPROX) 220 MG tablet, Take 220 mg by mouth daily as needed (pain). Reported on 02/28/2016, Disp: , Rfl:    Review of Systems  Constitutional: Positive for fatigue. Negative for fever, chills, diaphoresis, appetite change and unexpected weight change.  HENT: Positive for congestion, dental problem, ear pain, sneezing, sore throat and voice change. Negative for drooling, facial swelling, hearing loss, mouth sores and trouble swallowing.   Eyes: Positive for redness and itching. Negative for pain, discharge and visual disturbance.  Respiratory: Positive for cough and shortness of breath. Negative for choking and wheezing.   Cardiovascular: Negative for chest pain, palpitations and leg swelling.  Gastrointestinal: Positive for constipation. Negative for vomiting, abdominal pain, diarrhea and blood in stool.  Endocrine: Negative for cold intolerance, heat intolerance and polydipsia.  Genitourinary: Negative for dysuria, hematuria and decreased urine volume.  Musculoskeletal: Positive for back pain, arthralgias and gait problem.  Skin: Negative for rash.  Allergic/Immunologic: Positive for environmental allergies.  Neurological: Positive for headaches. Negative for seizures, syncope and light-headedness.  Hematological: Negative for adenopathy.  Psychiatric/Behavioral: Positive for suicidal ideas, dysphoric mood and agitation. The patient is nervous/anxious.     Per HPI unless specifically indicated  above     Objective:    BP 110/76 mmHg  Pulse 72  Temp(Src) 97.9 F (36.6 C)  Ht  (1.549 m)  Wt 168 lb 12.8 oz (76.567 kg)  BMI 31.91 kg/m2  SpO2 98%  LMP 01/02/2010  Wt Readings from Last 3 Encounters:  02/28/16 168 lb 12.8 oz (76.567 kg)  02/09/16 171 lb 12.8 oz (77.928 kg)  12/22/15 168 lb (76.204 kg)    Physical Exam  Constitutional: She is oriented to person, place, and time. She appears well-developed and well-nourished.   HENT:  Head: Normocephalic and atraumatic.  Right Ear: Hearing, tympanic membrane, external ear and ear canal normal.  Left Ear: Hearing, tympanic membrane, external ear and ear canal normal.  Nose: Rhinorrhea present.  Mouth/Throat: Uvula is midline and oropharynx is clear and moist. No oropharyngeal exudate.  Neck: Neck supple.  Cardiovascular: Normal rate and regular rhythm.   Pulmonary/Chest: Effort normal and breath sounds normal. She has no wheezes.  Lymphadenopathy:    She has no cervical adenopathy.  Neurological: She is alert and oriented to person, place, and time.  Skin: Skin is warm and dry.  Psychiatric: She has a normal mood and affect. Her behavior is normal.  Vitals reviewed.       Assessment & Plan:    Encounter Diagnosis  Name Primary?  . URI (upper respiratory infection) Yes     Rest. Fluids. rx medication given to help with symptoms F/u as scheduled. RTO sooner prn

## 2016-02-28 NOTE — Patient Instructions (Signed)
Upper Respiratory Infection, Adult Most upper respiratory infections (URIs) are a viral infection of the air passages leading to the lungs. A URI affects the nose, throat, and upper air passages. The most common type of URI is nasopharyngitis and is typically referred to as "the common cold." URIs run their course and usually go away on their own. Most of the time, a URI does not require medical attention, but sometimes a bacterial infection in the upper airways can follow a viral infection. This is called a secondary infection. Sinus and middle ear infections are common types of secondary upper respiratory infections. Bacterial pneumonia can also complicate a URI. A URI can worsen asthma and chronic obstructive pulmonary disease (COPD). Sometimes, these complications can require emergency medical care and may be life threatening.  CAUSES Almost all URIs are caused by viruses. A virus is a type of germ and can spread from one person to another.  RISKS FACTORS You may be at risk for a URI if:   You smoke.   You have chronic heart or lung disease.  You have a weakened defense (immune) system.   You are very young or very old.   You have nasal allergies or asthma.  You work in crowded or poorly ventilated areas.  You work in health care facilities or schools. SIGNS AND SYMPTOMS  Symptoms typically develop 2-3 days after you come in contact with a cold virus. Most viral URIs last 7-10 days. However, viral URIs from the influenza virus (flu virus) can last 14-18 days and are typically more severe. Symptoms may include:   Runny or stuffy (congested) nose.   Sneezing.   Cough.   Sore throat.   Headache.   Fatigue.   Fever.   Loss of appetite.   Pain in your forehead, behind your eyes, and over your cheekbones (sinus pain).  Muscle aches.  DIAGNOSIS  Your health care provider may diagnose a URI by:  Physical exam.  Tests to check that your symptoms are not due to  another condition such as:  Strep throat.  Sinusitis.  Pneumonia.  Asthma. TREATMENT  A URI goes away on its own with time. It cannot be cured with medicines, but medicines may be prescribed or recommended to relieve symptoms. Medicines may help:  Reduce your fever.  Reduce your cough.  Relieve nasal congestion. HOME CARE INSTRUCTIONS   Take medicines only as directed by your health care provider.   Gargle warm saltwater or take cough drops to comfort your throat as directed by your health care provider.  Use a warm mist humidifier or inhale steam from a shower to increase air moisture. This may make it easier to breathe.  Drink enough fluid to keep your urine clear or pale yellow.   Eat soups and other clear broths and maintain good nutrition.   Rest as needed.   Return to work when your temperature has returned to normal or as your health care provider advises. You may need to stay home longer to avoid infecting others. You can also use a face mask and careful hand washing to prevent spread of the virus.  Increase the usage of your inhaler if you have asthma.   Do not use any tobacco products, including cigarettes, chewing tobacco, or electronic cigarettes. If you need help quitting, ask your health care provider. PREVENTION  The best way to protect yourself from getting a cold is to practice good hygiene.   Avoid oral or hand contact with people with cold   symptoms.   Wash your hands often if contact occurs.  There is no clear evidence that vitamin C, vitamin E, echinacea, or exercise reduces the chance of developing a cold. However, it is always recommended to get plenty of rest, exercise, and practice good nutrition.  SEEK MEDICAL CARE IF:   You are getting worse rather than better.   Your symptoms are not controlled by medicine.   You have chills.  You have worsening shortness of breath.  You have brown or red mucus.  You have yellow or brown nasal  discharge.  You have pain in your face, especially when you bend forward.  You have a fever.  You have swollen neck glands.  You have pain while swallowing.  You have white areas in the back of your throat. SEEK IMMEDIATE MEDICAL CARE IF:   You have severe or persistent:  Headache.  Ear pain.  Sinus pain.  Chest pain.  You have chronic lung disease and any of the following:  Wheezing.  Prolonged cough.  Coughing up blood.  A change in your usual mucus.  You have a stiff neck.  You have changes in your:  Vision.  Hearing.  Thinking.  Mood. MAKE SURE YOU:   Understand these instructions.  Will watch your condition.  Will get help right away if you are not doing well or get worse.   This information is not intended to replace advice given to you by your health care provider. Make sure you discuss any questions you have with your health care provider.   Document Released: 03/13/2001 Document Revised: 02/01/2015 Document Reviewed: 12/23/2013 Elsevier Interactive Patient Education 2016 [employer] Inc.  

## 2016-03-17 ENCOUNTER — Other Ambulatory Visit: Payer: Self-pay | Admitting: Physician Assistant

## 2016-04-18 ENCOUNTER — Encounter: Payer: Self-pay | Admitting: Physician Assistant

## 2016-04-18 NOTE — Progress Notes (Signed)
[facility]16 at 1:15pm. Pt cancelled and has not rescheduled. [facility].

## 2016-05-02 ENCOUNTER — Other Ambulatory Visit: Payer: Self-pay

## 2016-05-02 DIAGNOSIS — E785 Hyperlipidemia, unspecified: Secondary | ICD-10-CM

## 2016-05-08 ENCOUNTER — Ambulatory Visit: Payer: Self-pay | Admitting: Physician Assistant

## 2016-05-11 LAB — COMPLETE METABOLIC PANEL WITH GFR
ALT: 12 U/L (ref 6–29)
AST: 14 U/L (ref 10–35)
Albumin: 3.9 g/dL (ref 3.6–5.1)
Alkaline Phosphatase: 46 U/L (ref 33–130)
BUN: 13 mg/dL (ref 7–25)
CO2: 22 mmol/L (ref 20–31)
Calcium: 9.3 mg/dL (ref 8.6–10.4)
Chloride: 106 mmol/L (ref 98–110)
Creat: 0.82 mg/dL (ref 0.50–1.05)
GFR, Est African American: >89 mL/min (ref >=60)
GFR, Est Non African American: 81 mL/min (ref >=60)
Glucose, Bld: 101 mg/dL — ABNORMAL HIGH (ref 65–99)
Potassium: 4.3 mmol/L (ref 3.5–5.3)
Sodium: 138 mmol/L (ref 135–146)
Total Bilirubin: 0.7 mg/dL (ref 0.2–1.2)
Total Protein: 6.2 g/dL (ref 6.1–8.1)

## 2016-05-11 LAB — LIPID PANEL
Cholesterol: 157 mg/dL (ref 125–200)
HDL: 66 mg/dL (ref >=46)
LDL Cholesterol: 78 mg/dL (ref <130)
Total CHOL/HDL Ratio: 2.4 Ratio (ref <=5.0)
Triglycerides: 65 mg/dL (ref <150)
VLDL: 13 mg/dL (ref <30)

## 2016-05-15 ENCOUNTER — Ambulatory Visit: Payer: Self-pay | Admitting: Physician Assistant

## 2016-05-15 ENCOUNTER — Encounter: Payer: Self-pay | Admitting: Physician Assistant

## 2016-05-15 VITALS — BP 124/80 | HR 60 | Temp 97.7°F | Ht 61.0 in | Wt 168.6 lb

## 2016-05-15 DIAGNOSIS — F32A Depression, unspecified: Secondary | ICD-10-CM

## 2016-05-15 DIAGNOSIS — G8929 Other chronic pain: Secondary | ICD-10-CM

## 2016-05-15 DIAGNOSIS — E785 Hyperlipidemia, unspecified: Secondary | ICD-10-CM

## 2016-05-15 DIAGNOSIS — F329 Major depressive disorder, single episode, unspecified: Secondary | ICD-10-CM

## 2016-05-15 DIAGNOSIS — K219 Gastro-esophageal reflux disease without esophagitis: Secondary | ICD-10-CM

## 2016-05-15 NOTE — Progress Notes (Signed)
BP 124/80 (BP Location: Left Arm, Patient Position: Sitting, Cuff Size: Normal)   Pulse 60   Temp 97.7 F (36.5 C) (Other (Comment))   Ht 5\' 1"  (1.549 m)   Wt 168 lb 9.6 oz (76.5 kg)   LMP 01/02/2010   SpO2 98%   BMI 31.86 kg/m    Subjective:    Patient ID: Julia [name], female    DOB: 03/02/1961, 55 y.o.   MRN: [id number]  HPI: Julia [name] is a 55 y.o. female presenting on 05/15/2016 for No chief complaint on file.   HPI   Pt has not been back to [facility]ology.  She went once prior to her May appointment here.  She never saw the GI dr there.  She is [facility]  She is still stressed due to her sister with CA- she is starting round 5 of of chemo tomorrow.  Pt is caring for her sister who is living with her now.    Pt states protonix helps some but she still has some pains at times  Relevant past medical, surgical, family and social history reviewed and updated as indicated. Interim medical history since our last visit reviewed. Allergies and medications reviewed and updated.   Current Outpatient Prescriptions:  .  ALPRAZolam (XANAX) 0.5 MG tablet, Take 0.5 mg by mouth as needed for anxiety (panic)., Disp: , Rfl:  .  amitriptyline (ELAVIL) 25 MG tablet, Take 25 mg by mouth at bedtime as needed. , Disp: , Rfl:  .  atorvastatin (LIPITOR) 20 MG tablet, Take 1 tablet (20 mg total) by mouth at bedtime., Disp: 90 tablet, Rfl: 1 .  Biotin 1000 MCG tablet, Take 1,000 mcg by mouth at bedtime., Disp: , Rfl:  .  Calcium-Vitamin D-Vitamin K (CALCIUM SOFT CHEWS PO), Take 1 tablet by mouth 2 (two) times daily., Disp: , Rfl:  .  FLUoxetine (PROZAC) 40 MG capsule, Take 60 mg by mouth daily. , Disp: , Rfl:  .  meloxicam (MOBIC) 15 MG tablet, Take 1 tablet (15 mg total) by mouth daily as needed for pain., Disp: 30 tablet, Rfl: 3 .  methylphenidate (RITALIN) 10 MG tablet, Take 5-10 mg by mouth 2 (two) times daily as needed., Disp: , Rfl:  .  pantoprazole  (PROTONIX) 40 MG tablet, TAKE ONE TABLET BY MOUTH TWICE DAILY, Disp: 60 tablet, Rfl: 2  Review of Systems  Constitutional: Positive for diaphoresis and fatigue. Negative for appetite change, chills, fever and unexpected weight change.  HENT: Positive for dental problem. Negative for congestion, drooling, ear pain, facial swelling, hearing loss, mouth sores, sneezing, sore throat, trouble swallowing and voice change.   Eyes: Positive for itching. Negative for pain, discharge, redness and visual disturbance.  Respiratory: Negative for cough, choking, shortness of breath and wheezing.   Cardiovascular: Negative for chest pain, palpitations and leg swelling.  Gastrointestinal: Positive for abdominal pain. Negative for blood in stool, constipation, diarrhea and vomiting.  Endocrine: Negative for cold intolerance, heat intolerance and polydipsia.  Genitourinary: Negative for decreased urine volume, dysuria and hematuria.  Musculoskeletal: Negative for arthralgias, back pain and gait problem.  Skin: Negative for rash.  Allergic/Immunologic: Negative for environmental allergies.  Neurological: Positive for light-headedness and headaches. Negative for seizures and syncope.  Hematological: Negative for adenopathy.  Psychiatric/Behavioral: Positive for dysphoric mood and suicidal ideas. Negative for agitation. The patient is nervous/anxious.     Per HPI unless specifically indicated above     Objective:  BP 124/80 (BP Location: Left Arm, Patient Position: Sitting, Cuff Size: Normal)   Pulse 60   Temp 97.7 F (36.5 C) (Other (Comment))   Ht 5\' 1"  (1.549 m)   Wt 168 lb 9.6 oz (76.5 kg)   LMP 01/02/2010   SpO2 98%   BMI 31.86 kg/m   Wt Readings from Last 3 Encounters:  05/15/16 168 lb 9.6 oz (76.5 kg)  02/28/16 168 lb 12.8 oz (76.6 kg)  02/09/16 171 lb 12.8 oz (77.9 kg)    Physical Exam  Constitutional: She is oriented to person, place, and time. She appears well-developed and  well-nourished.  HENT:  Head: Normocephalic and atraumatic.  Neck: Neck supple.  Cardiovascular: Normal rate and regular rhythm.   Pulmonary/Chest: Effort normal and breath sounds normal.  Abdominal: Soft. Bowel sounds are normal. She exhibits no mass. There is no hepatosplenomegaly. There is no tenderness.  Musculoskeletal: She exhibits no edema.  Lymphadenopathy:    She has no cervical adenopathy.  Neurological: She is alert and oriented to person, place, and time.  Skin: Skin is warm and dry.  Psychiatric: She has a normal mood and affect. Her behavior is normal.  Vitals reviewed.   Results for orders placed or performed in visit on 05/02/16  COMPLETE METABOLIC PANEL WITH GFR  Result Value Ref Range   Sodium 138 135 - 146 mmol/L   Potassium 4.3 3.5 - 5.3 mmol/L   Chloride 106 98 - 110 mmol/L   CO2 22 20 - 31 mmol/L   Glucose, Bld 101 (H) 65 - 99 mg/dL   BUN 13 7 - 25 mg/dL   Creat 1.610.82 0.960.50 - 0.451.05 mg/dL   Total Bilirubin 0.7 0.2 - 1.2 mg/dL   Alkaline Phosphatase 46 33 - 130 U/L   AST 14 10 - 35 U/L   ALT 12 6 - 29 U/L   Total Protein 6.2 6.1 - 8.1 g/dL   Albumin 3.9 3.6 - 5.1 g/dL   Calcium 9.3 8.6 - 40.910.4 mg/dL   GFR, Est African American >89 >=60 mL/min   GFR, Est Non African American 81 >=60 mL/min  Lipid Profile  Result Value Ref Range   Cholesterol 157 125 - 200 mg/dL   Triglycerides 65 <811<150 mg/dL   HDL 66 >=91>=46 mg/dL   Total CHOL/HDL Ratio 2.4 <=5.0 Ratio   VLDL 13 <30 mg/dL   LDL Cholesterol 78 <478<130 mg/dL      Assessment & Plan:   Encounter Diagnoses  Name Primary?  . Hyperlipidemia Yes  . Gastroesophageal reflux disease, esophagitis presence not specified   . Chronic pain   . Depression     -Reviewed labs with pt -pt aware that she needs to [facility] to see rheumatology and GI.  She says she will call for appointment when she is able ie. When she has time away from caring for her sick sister -she will continue with [facility] MH issues -f/u 3  months.  RTO sooner prn

## 2016-05-27 ENCOUNTER — Other Ambulatory Visit: Payer: Self-pay | Admitting: Physician Assistant

## 2016-07-02 ENCOUNTER — Ambulatory Visit: Payer: Self-pay

## 2016-08-07 ENCOUNTER — Other Ambulatory Visit: Payer: Self-pay | Admitting: Student

## 2016-08-07 DIAGNOSIS — E785 Hyperlipidemia, unspecified: Secondary | ICD-10-CM

## 2016-08-07 LAB — COMPLETE METABOLIC PANEL WITH GFR
ALT: 15 U/L (ref 6–29)
AST: 15 U/L (ref 10–35)
Albumin: 4.1 g/dL (ref 3.6–5.1)
Alkaline Phosphatase: 47 U/L (ref 33–130)
BUN: 14 mg/dL (ref 7–25)
CO2: 24 mmol/L (ref 20–31)
Calcium: 9.3 mg/dL (ref 8.6–10.4)
Chloride: 106 mmol/L (ref 98–110)
Creat: 0.73 mg/dL (ref 0.50–1.05)
GFR, Est African American: >89 mL/min (ref >=60)
GFR, Est Non African American: >89 mL/min (ref >=60)
Glucose, Bld: 107 mg/dL — ABNORMAL HIGH (ref 65–99)
Potassium: 4.2 mmol/L (ref 3.5–5.3)
Sodium: 138 mmol/L (ref 135–146)
Total Bilirubin: 0.7 mg/dL (ref 0.2–1.2)
Total Protein: 6.4 g/dL (ref 6.1–8.1)

## 2016-08-07 LAB — LIPID PANEL
Cholesterol: 179 mg/dL (ref <200)
HDL: 61 mg/dL (ref >50)
LDL Cholesterol: 97 mg/dL
Total CHOL/HDL Ratio: 2.9 Ratio (ref <5.0)
Triglycerides: 105 mg/dL (ref <150)
VLDL: 21 mg/dL (ref <30)

## 2016-08-15 ENCOUNTER — Encounter: Payer: Self-pay | Admitting: Physician Assistant

## 2016-08-15 ENCOUNTER — Other Ambulatory Visit: Payer: Self-pay | Admitting: Physician Assistant

## 2016-08-15 ENCOUNTER — Ambulatory Visit: Payer: Self-pay | Admitting: Physician Assistant

## 2016-08-15 VITALS — BP 120/88 | HR 72 | Temp 98.2°F | Ht 61.0 in | Wt 172.8 lb

## 2016-08-15 DIAGNOSIS — F329 Major depressive disorder, single episode, unspecified: Secondary | ICD-10-CM

## 2016-08-15 DIAGNOSIS — K219 Gastro-esophageal reflux disease without esophagitis: Secondary | ICD-10-CM

## 2016-08-15 DIAGNOSIS — G8929 Other chronic pain: Secondary | ICD-10-CM

## 2016-08-15 DIAGNOSIS — K5900 Constipation, unspecified: Secondary | ICD-10-CM

## 2016-08-15 DIAGNOSIS — E785 Hyperlipidemia, unspecified: Secondary | ICD-10-CM

## 2016-08-15 DIAGNOSIS — F32A Depression, unspecified: Secondary | ICD-10-CM

## 2016-08-15 DIAGNOSIS — R109 Unspecified abdominal pain: Secondary | ICD-10-CM

## 2016-08-15 MED ORDER — OMEPRAZOLE 20 MG PO CPDR: 20.0000 mg | DELAYED_RELEASE_CAPSULE | Freq: Every day | ORAL | 3 refills | Status: DC

## 2016-08-15 NOTE — Progress Notes (Signed)
BP 120/88 (BP Location: Left Arm, Patient Position: Sitting, Cuff Size: Normal)   Pulse 72   Temp 98.2 F (36.8 C) (Other (Comment))   Ht 5\' 1"  (1.549 m)   Wt 172 lb 12.8 oz (78.4 kg)   LMP 01/02/2010   SpO2 97%   BMI 32.65 kg/m    Subjective:    Patient ID: Julia [name], female    DOB: 07/09/1961, 55 y.o.   MRN: [id number]  HPI: Julia [name] is a 55 y.o. female presenting on 08/15/2016 for Hyperlipidemia and Gastroesophageal Reflux   HPI  Pt 'ssister finished with her chemo.  She [place] in [place].  S[facility] is still [facility]  Her GERD is good most of the time.  occassion heartburn.  Pt still complaining of multiple joint pains.  It was recommended to her that she contact [facility] to return there to see the rheumatologist but she still has not done this.  She says she has been busy  Relevant past medical, surgical, family and social history reviewed and updated as indicated. Interim medical history since our last visit reviewed. Allergies and medications reviewed and updated.   Current Outpatient Prescriptions:  .  ALPRAZolam (XANAX) 0.5 MG tablet, Take 0.5 mg by mouth as needed for anxiety (panic)., Disp: , Rfl:  .  amitriptyline (ELAVIL) 25 MG tablet, Take 25 mg by mouth at bedtime as needed. , Disp: , Rfl:  .  atorvastatin (LIPITOR) 20 MG tablet, Take 1 tablet (20 mg total) by mouth at bedtime., Disp: 90 tablet, Rfl: 1 .  Calcium-Vitamin D-Vitamin K (CALCIUM SOFT CHEWS PO), Take 1 tablet by mouth 2 (two) times daily., Disp: , Rfl:  .  FLUoxetine (PROZAC) 40 MG capsule, Take 60 mg by mouth daily. , Disp: , Rfl:  .  lurasidone (LATUDA) 20 MG TABS tablet, Take 20 mg by mouth daily as needed., Disp: , Rfl:  .  meloxicam (MOBIC) 15 MG tablet, TAKE ONE TABLET BY MOUTH ONCE DAILY AS NEEDED FOR PAIN, Disp: 30 tablet, Rfl: 3 .  pantoprazole (PROTONIX) 40 MG tablet, TAKE ONE TABLET BY MOUTH TWICE DAILY, Disp: 60 tablet, Rfl: 2 .  Biotin 1000 MCG tablet,  Take 1,000 mcg by mouth at bedtime., Disp: , Rfl:  .  methylphenidate (RITALIN) 10 MG tablet, Take 5-10 mg by mouth 2 (two) times daily as needed., Disp: , Rfl:    Review of Systems  Constitutional: Positive for diaphoresis. Negative for appetite change, chills, fatigue, fever and unexpected weight change.  HENT: Positive for dental problem. Negative for congestion, drooling, ear pain, facial swelling, hearing loss, mouth sores, sneezing, sore throat, trouble swallowing and voice change.   Eyes: Positive for itching. Negative for pain, discharge, redness and visual disturbance.  Respiratory: Negative for cough, choking, shortness of breath and wheezing.   Cardiovascular: Negative for chest pain, palpitations and leg swelling.  Gastrointestinal: Positive for constipation. Negative for abdominal pain, blood in stool, diarrhea and vomiting.  Endocrine: Positive for heat intolerance. Negative for cold intolerance and polydipsia.  Genitourinary: Negative for decreased urine volume, dysuria and hematuria.  Musculoskeletal: Positive for arthralgias, back pain and gait problem.  Skin: Negative for rash.  Allergic/Immunologic: Negative for environmental allergies.  Neurological: Negative for seizures, syncope, light-headedness and headaches.  Hematological: Negative for adenopathy.  Psychiatric/Behavioral: Positive for agitation, dysphoric mood and suicidal ideas. The patient is nervous/anxious.     Per HPI unless specifically indicated above     Objective:  BP 120/88 (BP Location: Left Arm, Patient Position: Sitting, Cuff Size: Normal)   Pulse 72   Temp 98.2 F (36.8 C) (Other (Comment))   Ht 5\' 1"  (1.549 m)   Wt 172 lb 12.8 oz (78.4 kg)   LMP 01/02/2010   SpO2 97%   BMI 32.65 kg/m   Wt Readings from Last 3 Encounters:  08/15/16 172 lb 12.8 oz (78.4 kg)  05/15/16 168 lb 9.6 oz (76.5 kg)  02/28/16 168 lb 12.8 oz (76.6 kg)    Physical Exam  Constitutional: She is oriented to person,  place, and time. She appears well-developed and well-nourished.  HENT:  Head: Normocephalic and atraumatic.  Neck: Neck supple.  Cardiovascular: Normal rate and regular rhythm.   Pulmonary/Chest: Effort normal and breath sounds normal.  Abdominal: Soft. Bowel sounds are normal. She exhibits no mass. There is no hepatosplenomegaly. There is no tenderness.  Musculoskeletal: She exhibits no edema.  Lymphadenopathy:    She has no cervical adenopathy.  Neurological: She is alert and oriented to person, place, and time.  Skin: Skin is warm and dry.  Psychiatric: She has a normal mood and affect. Her behavior is normal.  Vitals reviewed.   Results for orders placed or performed in visit on 08/07/16  COMPLETE METABOLIC PANEL WITH GFR  Result Value Ref Range   Sodium 138 135 - 146 mmol/L   Potassium 4.2 3.5 - 5.3 mmol/L   Chloride 106 98 - 110 mmol/L   CO2 24 20 - 31 mmol/L   Glucose, Bld 107 (H) 65 - 99 mg/dL   BUN 14 7 - 25 mg/dL   Creat 1.610.73 0.960.50 - 0.451.05 mg/dL   Total Bilirubin 0.7 0.2 - 1.2 mg/dL   Alkaline Phosphatase 47 33 - 130 U/L   AST 15 10 - 35 U/L   ALT 15 6 - 29 U/L   Total Protein 6.4 6.1 - 8.1 g/dL   Albumin 4.1 3.6 - 5.1 g/dL   Calcium 9.3 8.6 - 40.910.4 mg/dL   GFR, Est African American >89 >=60 mL/min   GFR, Est Non African American >89 >=60 mL/min  Lipid Profile  Result Value Ref Range   Cholesterol 179 <200 mg/dL   Triglycerides 811105 <914<150 mg/dL   HDL 61 >78>50 mg/dL   Total CHOL/HDL Ratio 2.9 <5.0 Ratio   VLDL 21 <30 mg/dL   LDL Cholesterol 97 mg/dL      Assessment & Plan:   Encounter Diagnoses  Name Primary?  . Hyperlipidemia, unspecified hyperlipidemia type Yes  . Gastroesophageal reflux disease, esophagitis presence not specified   . Other chronic pain   . Depression, unspecified depression type   . Abdominal pain, unspecified abdominal location   . Constipation, unspecified constipation type     -reviewed labs with pt -will rx Omeprazole from [facility]  due to cost of protonix -pt to Continue lipitor -pt to Continue with  [facility] for MH issues -Urged [facility] and GI (she has been seen there since she was unable to qualify for [facility] discount) -F/u 3 months.  RTO sooner prn  (The duration of this appointment visit was 25 minutes of face-to-face time with the patient.  Greater than 50% of this time was spent in counseling, explanation of diagnosis, planning of further management, and coordination of care.)

## 2016-09-06 ENCOUNTER — Other Ambulatory Visit: Payer: Self-pay | Admitting: Physician Assistant

## 2016-09-06 MED ORDER — ATORVASTATIN CALCIUM 20 MG PO TABS: 20.0000 mg | ORAL_TABLET | Freq: Every day | ORAL | 2 refills | Status: DC

## 2016-10-29 ENCOUNTER — Other Ambulatory Visit: Payer: Self-pay | Admitting: Physician Assistant

## 2016-11-07 ENCOUNTER — Other Ambulatory Visit: Payer: Self-pay | Admitting: Student

## 2016-11-07 DIAGNOSIS — E785 Hyperlipidemia, unspecified: Secondary | ICD-10-CM

## 2016-11-13 LAB — COMPREHENSIVE METABOLIC PANEL
ALT: 17 U/L (ref 6–29)
AST: 17 U/L (ref 10–35)
Albumin: 3.9 g/dL (ref 3.6–5.1)
Alkaline Phosphatase: 52 U/L (ref 33–130)
BUN: 14 mg/dL (ref 7–25)
CO2: 25 mmol/L (ref 20–31)
Calcium: 9.3 mg/dL (ref 8.6–10.4)
Chloride: 110 mmol/L (ref 98–110)
Creat: 0.82 mg/dL (ref 0.50–1.05)
Glucose, Bld: 101 mg/dL — ABNORMAL HIGH (ref 65–99)
Potassium: 4.2 mmol/L (ref 3.5–5.3)
Sodium: 142 mmol/L (ref 135–146)
Total Bilirubin: 0.5 mg/dL (ref 0.2–1.2)
Total Protein: 6.3 g/dL (ref 6.1–8.1)

## 2016-11-13 LAB — LIPID PANEL
Cholesterol: 154 mg/dL (ref <200)
HDL: 50 mg/dL — ABNORMAL LOW (ref >50)
LDL Cholesterol: 77 mg/dL (ref <100)
Total CHOL/HDL Ratio: 3.1 Ratio (ref <5.0)
Triglycerides: 136 mg/dL (ref <150)
VLDL: 27 mg/dL (ref <30)

## 2016-11-15 ENCOUNTER — Ambulatory Visit: Payer: Self-pay | Admitting: Physician Assistant

## 2016-11-15 ENCOUNTER — Encounter: Payer: Self-pay | Admitting: Physician Assistant

## 2016-11-15 VITALS — BP 116/80 | HR 68 | Temp 97.5°F | Ht 61.0 in | Wt 174.0 lb

## 2016-11-15 DIAGNOSIS — E785 Hyperlipidemia, unspecified: Secondary | ICD-10-CM

## 2016-11-15 DIAGNOSIS — F329 Major depressive disorder, single episode, unspecified: Secondary | ICD-10-CM

## 2016-11-15 DIAGNOSIS — R109 Unspecified abdominal pain: Secondary | ICD-10-CM

## 2016-11-15 DIAGNOSIS — F32A Depression, unspecified: Secondary | ICD-10-CM

## 2016-11-15 DIAGNOSIS — K219 Gastro-esophageal reflux disease without esophagitis: Secondary | ICD-10-CM

## 2016-11-15 DIAGNOSIS — K5900 Constipation, unspecified: Secondary | ICD-10-CM

## 2016-11-15 DIAGNOSIS — G8929 Other chronic pain: Secondary | ICD-10-CM

## 2016-11-15 NOTE — Progress Notes (Signed)
BP 116/80 (BP Location: Left Arm, Patient Position: Sitting, Cuff Size: Normal)   Pulse 68   Temp 97.5 F (36.4 C) (Other (Comment))   Ht 5\' 1"  (1.549 m)   Wt 174 lb (78.9 kg)   LMP 01/02/2010   SpO2 99%   BMI 32.88 kg/m    Subjective:    Patient ID: Julia [name], female    DOB: 1960-12-28, 56 y.o.   MRN: [id number]  HPI: Julia [name] is a 56 y.o. female presenting on 11/15/2016 for Hyperlipidemia (states was at [facility] in [place] for partial hospitalization 09-27-16 -10-25-26 for depression) and Gastroesophageal Reflux   HPI   Chief Complaint  Patient presents with  . Hyperlipidemia    states was at [facility] in [place] for partial hospitalization 09-27-16 -10-25-26 for depression  . Gastroesophageal Reflux    Pt has not been back to rheumatology or GI at [facility].  She needs contact information for financial services.  Pt had problems with MH- did intensive treatment at [facility] and then went to [place] to see son & grandchildren.  Pt is still going to [facility] for MH.  She Says a lot of her MH problems due to her mother's death 2 years ago.   Pt states lots of neck pain and abdominal pain lately.  States no changes recently- just hurts as per her usual level of discomfort.   Relevant past medical, surgical, family and social history reviewed and updated as indicated. Interim medical history since our last visit reviewed. Allergies and medications reviewed and updated.   Current Outpatient Prescriptions:  .  ALPRAZolam (XANAX) 0.5 MG tablet, Take 0.5 mg by mouth as needed for anxiety (panic)., Disp: , Rfl:  .  amitriptyline (ELAVIL) 25 MG tablet, Take 25 mg by mouth at bedtime as needed. , Disp: , Rfl:  .  atorvastatin (LIPITOR) 20 MG tablet, Take 1 tablet (20 mg total) by mouth at bedtime., Disp: 90 tablet, Rfl: 2 .  Calcium-Vitamin D-Vitamin K (CALCIUM SOFT CHEWS PO), Take 1 tablet by mouth 2 (two) times daily., Disp: , Rfl:  .  meloxicam (MOBIC) 15 MG tablet, TAKE ONE  TABLET BY MOUTH ONCE DAILY AS NEEDED FOR PAIN, Disp: 30 tablet, Rfl: 3 .  omeprazole (PRILOSEC) 20 MG capsule, Take 1 capsule (20 mg total) by mouth daily., Disp: 90 capsule, Rfl: 3 .  sertraline (ZOLOFT) 100 MG tablet, Take 150 mg by mouth daily., Disp: , Rfl:  .  gabapentin (NEURONTIN) 100 MG capsule, Take 100-200 mg by mouth at bedtime., Disp: , Rfl:  .  prazosin (MINIPRESS) 1 MG capsule, Take 1-2 mg by mouth at bedtime., Disp: , Rfl:    Review of Systems  Constitutional: Positive for fatigue. Negative for appetite change, chills, diaphoresis, fever and unexpected weight change.  HENT: Positive for dental problem. Negative for congestion, drooling, ear pain, facial swelling, hearing loss, mouth sores, sneezing, sore throat, trouble swallowing and voice change.   Eyes: Negative for pain, discharge, redness, itching and visual disturbance.  Respiratory: Negative for cough, choking, shortness of breath and wheezing.   Cardiovascular: Negative for chest pain, palpitations and leg swelling.  Gastrointestinal: Negative for abdominal pain, blood in stool, constipation, diarrhea and vomiting.  Endocrine: Negative for cold intolerance, heat intolerance and polydipsia.  Genitourinary: Negative for decreased urine volume, dysuria and hematuria.  Musculoskeletal: Positive for arthralgias, back pain and gait problem.  Skin: Negative for rash.  Allergic/Immunologic: Negative for environmental allergies.  Neurological: Positive for light-headedness and  headaches. Negative for seizures and syncope.  Hematological: Negative for adenopathy.  Psychiatric/Behavioral: Positive for agitation, dysphoric mood and suicidal ideas. The patient is nervous/anxious.     Per HPI unless specifically indicated above     Objective:    BP 116/80 (BP Location: Left Arm, Patient Position: Sitting, Cuff Size: Normal)   Pulse 68   Temp 97.5 F (36.4 C) (Other (Comment))   Ht 5\' 1"  (1.549 m)   Wt 174 lb (78.9 kg)    LMP 01/02/2010   SpO2 99%   BMI 32.88 kg/m   Wt Readings from Last 3 Encounters:  11/15/16 174 lb (78.9 kg)  08/15/16 172 lb 12.8 oz (78.4 kg)  05/15/16 168 lb 9.6 oz (76.5 kg)    Physical Exam  Constitutional: She is oriented to person, place, and time. She appears well-developed and well-nourished.  HENT:  Head: Normocephalic and atraumatic.  Neck: Neck supple.  Cardiovascular: Normal rate and regular rhythm.   Pulmonary/Chest: Effort normal and breath sounds normal.  Abdominal: Soft. Normal appearance and bowel sounds are normal. She exhibits no distension, no ascites and no mass. There is no hepatosplenomegaly. There is generalized tenderness. There is no rigidity, no rebound and no guarding.  Musculoskeletal: She exhibits no edema.  Lymphadenopathy:    She has no cervical adenopathy.  Neurological: She is alert and oriented to person, place, and time.  Skin: Skin is warm and dry.  Psychiatric: Her speech is normal and behavior is normal. Cognition and memory are normal. She exhibits a depressed mood.  Vitals reviewed.   Results for orders placed or performed in visit on 11/07/16  Lipid Profile  Result Value Ref Range   Cholesterol 154 <200 mg/dL   Triglycerides 161136 <096<150 mg/dL   HDL 50 (L) >04>50 mg/dL   Total CHOL/HDL Ratio 3.1 <5.0 Ratio   VLDL 27 <30 mg/dL   LDL Cholesterol 77 <540<100 mg/dL  Comprehensive metabolic panel  Result Value Ref Range   Sodium 142 135 - 146 mmol/L   Potassium 4.2 3.5 - 5.3 mmol/L   Chloride 110 98 - 110 mmol/L   CO2 25 20 - 31 mmol/L   Glucose, Bld 101 (H) 65 - 99 mg/dL   BUN 14 7 - 25 mg/dL   Creat 9.810.82 1.910.50 - 4.781.05 mg/dL   Total Bilirubin 0.5 0.2 - 1.2 mg/dL   Alkaline Phosphatase 52 33 - 130 U/L   AST 17 10 - 35 U/L   ALT 17 6 - 29 U/L   Total Protein 6.3 6.1 - 8.1 g/dL   Albumin 3.9 3.6 - 5.1 g/dL   Calcium 9.3 8.6 - 29.510.4 mg/dL      Assessment & Plan:   Encounter Diagnoses  Name Primary?  . Hyperlipidemia, unspecified  hyperlipidemia type Yes  . Depression, unspecified depression type   . Other chronic pain   . Gastroesophageal reflux disease, esophagitis presence not specified   . Abdominal pain, unspecified abdominal location   . Constipation, unspecified constipation type     -reviewed labs with pt  -Discussed mammogram- pt will be 56 in 2 wk so she would like to go to every 2nd years monitoring now and not get tested annually. -pt to continue atorvastatin for lipids -encouraged pt to get back in [facility] and [facility].  She is given contact phone number for financial office.  -she is to [facility] -follow up 3 months for PAP.  As pt has been on lipitor for a  year now, she will not need labs but every 6 months.  She is to RTO sooner prn

## 2016-11-15 NOTE — Patient Instructions (Signed)
[facility] Financial Counselor [phone]

## 2017-02-12 ENCOUNTER — Encounter: Payer: Self-pay | Admitting: Physician Assistant

## 2017-02-12 ENCOUNTER — Ambulatory Visit: Payer: Self-pay | Admitting: Physician Assistant

## 2017-02-12 VITALS — BP 110/74 | HR 66 | Temp 97.9°F | Ht 61.0 in | Wt 169.0 lb

## 2017-02-12 DIAGNOSIS — G8929 Other chronic pain: Secondary | ICD-10-CM

## 2017-02-12 DIAGNOSIS — F32A Depression, unspecified: Secondary | ICD-10-CM

## 2017-02-12 DIAGNOSIS — E785 Hyperlipidemia, unspecified: Secondary | ICD-10-CM

## 2017-02-12 DIAGNOSIS — F329 Major depressive disorder, single episode, unspecified: Secondary | ICD-10-CM

## 2017-02-12 NOTE — Progress Notes (Signed)
BP 110/74 (BP Location: Left Arm, Patient Position: Sitting, Cuff Size: Normal)   Pulse 66   Temp 97.9 F (36.6 C)   Ht 5\' 1"  (1.549 m)   Wt 169 lb (76.7 kg)   LMP 01/02/2010   SpO2 98%   BMI 31.93 kg/m    Subjective:    Patient ID: Julia KickJanis M Moro, female    DOB: 04/26/1961, 56 y.o.   MRN: [id number]  HPI: Julia [name] is a 56 y.o. female presenting on 02/12/2017 for Follow-up and Gynecologic Exam   HPI   Pt still going to [facility]  She is still talking with financial counselor [facility] so she can try to get back in with rheumatologist.   PAP not needed- pt s/p hysterectomy  Relevant past medical, surgical, family and social history reviewed and updated as indicated. Interim medical history since our last visit reviewed. Allergies and medications reviewed and updated.   Current Outpatient Prescriptions:  .  ALPRAZolam (XANAX) 0.5 MG tablet, Take 0.5 mg by mouth as needed for anxiety (panic)., Disp: , Rfl:  .  amitriptyline (ELAVIL) 25 MG tablet, Take 12.5 mg by mouth at bedtime. , Disp: , Rfl:  .  atorvastatin (LIPITOR) 20 MG tablet, Take 1 tablet (20 mg total) by mouth at bedtime., Disp: 90 tablet, Rfl: 2 .  Calcium-Vitamin D-Vitamin K (CALCIUM SOFT CHEWS PO), Take 1 tablet by mouth 2 (two) times daily., Disp: , Rfl:  .  gabapentin (NEURONTIN) 100 MG capsule, Take 100-200 mg by mouth at bedtime., Disp: , Rfl:  .  meloxicam (MOBIC) 15 MG tablet, TAKE ONE TABLET BY MOUTH ONCE DAILY AS NEEDED FOR PAIN, Disp: 30 tablet, Rfl: 3 .  omeprazole (PRILOSEC) 20 MG capsule, Take 1 capsule (20 mg total) by mouth daily., Disp: 90 capsule, Rfl: 3 .  prazosin (MINIPRESS) 1 MG capsule, Take 1-2 mg by mouth at bedtime., Disp: , Rfl:  .  sertraline (ZOLOFT) 100 MG tablet, Take 200 mg by mouth daily. , Disp: , Rfl:    Review of Systems  Constitutional: Positive for diaphoresis and fatigue. Negative for appetite change, chills, fever and unexpected weight change.  HENT: Positive for  dental problem. Negative for congestion, drooling, ear pain, facial swelling, hearing loss, mouth sores, sneezing, sore throat, trouble swallowing and voice change.   Eyes: Positive for itching. Negative for pain, discharge, redness and visual disturbance.  Respiratory: Positive for shortness of breath. Negative for cough, choking and wheezing.   Cardiovascular: Negative for chest pain, palpitations and leg swelling.  Gastrointestinal: Positive for abdominal pain and constipation. Negative for blood in stool, diarrhea and vomiting.  Endocrine: Negative for cold intolerance, heat intolerance and polydipsia.  Genitourinary: Negative for decreased urine volume, dysuria and hematuria.  Musculoskeletal: Positive for arthralgias, back pain and gait problem.  Skin: Negative for rash.  Allergic/Immunologic: Negative for environmental allergies.  Neurological: Negative for seizures, syncope, light-headedness and headaches.  Hematological: Negative for adenopathy.  Psychiatric/Behavioral: Positive for dysphoric mood and suicidal ideas. Negative for agitation. The patient is nervous/anxious.     Per HPI unless specifically indicated above     Objective:    BP 110/74 (BP Location: Left Arm, Patient Position: Sitting, Cuff Size: Normal)   Pulse 66   Temp 97.9 F (36.6 C)   Ht 5\' 1"  (1.549 m)   Wt 169 lb (76.7 kg)   LMP 01/02/2010   SpO2 98%   BMI 31.93 kg/m   Wt Readings from Last 3 Encounters:  02/12/17 169  lb (76.7 kg)  11/15/16 174 lb (78.9 kg)  08/15/16 172 lb 12.8 oz (78.4 kg)    Physical Exam  Constitutional: She is oriented to person, place, and time. She appears well-developed and well-nourished.  HENT:  Head: Normocephalic and atraumatic.  Neck: Neck supple.  Cardiovascular: Normal rate and regular rhythm.   Pulmonary/Chest: Effort normal and breath sounds normal.  Abdominal: Soft. Bowel sounds are normal. She exhibits no mass. There is no hepatosplenomegaly. There is no  tenderness.  Musculoskeletal: She exhibits no edema.  Lymphadenopathy:    She has no cervical adenopathy.  Neurological: She is alert and oriented to person, place, and time.  Skin: Skin is warm and dry.  Psychiatric: She has a normal mood and affect. Her behavior is normal.  Vitals reviewed.       Assessment & Plan:    Encounter Diagnoses  Name Primary?  . Other chronic pain Yes  . Hyperlipidemia, unspecified hyperlipidemia type   . Depression, unspecified depression type      -Labs before f/u appt -Pt to [facility] -Pt to continue with financial counselor at [facility] to see GI (for IBs) and rheumatology -f/u 3 months. RTO sooner prn

## 2017-03-25 ENCOUNTER — Other Ambulatory Visit: Payer: Self-pay | Admitting: Physician Assistant

## 2017-03-25 DIAGNOSIS — G8929 Other chronic pain: Secondary | ICD-10-CM

## 2017-03-25 DIAGNOSIS — E785 Hyperlipidemia, unspecified: Secondary | ICD-10-CM

## 2017-03-25 MED ORDER — MELOXICAM 15 MG PO TABS: ORAL_TABLET | ORAL | 2 refills | Status: DC

## 2017-04-08 ENCOUNTER — Other Ambulatory Visit: Payer: Self-pay | Admitting: Physician Assistant

## 2017-05-15 ENCOUNTER — Ambulatory Visit: Payer: Self-pay | Admitting: Physician Assistant

## 2017-05-15 ENCOUNTER — Other Ambulatory Visit (HOSPITAL_COMMUNITY)
Admission: RE | Admit: 2017-05-15 | Discharge: 2017-05-15 | Disposition: A | Discharge status: Home / Self Care | Payer: Self-pay | Source: Ambulatory Visit | Attending: Physician Assistant | Admitting: Physician Assistant

## 2017-05-15 DIAGNOSIS — E785 Hyperlipidemia, unspecified: Secondary | ICD-10-CM | POA: Insufficient documentation

## 2017-05-15 LAB — LIPID PANEL
Cholesterol: 167 mg/dL (ref 0–200)
HDL: 64 mg/dL (ref >40)
LDL Cholesterol: 86 mg/dL (ref 0–99)
Total CHOL/HDL Ratio: 2.6 RATIO
Triglycerides: 84 mg/dL (ref <150)
VLDL: 17 mg/dL (ref 0–40)

## 2017-05-15 LAB — COMPREHENSIVE METABOLIC PANEL
ALT: 16 U/L (ref 14–54)
AST: 24 U/L (ref 15–41)
Albumin: 4.3 g/dL (ref 3.5–5.0)
Alkaline Phosphatase: 52 U/L (ref 38–126)
Anion gap: 7 (ref 5–15)
BUN: 19 mg/dL (ref 6–20)
CO2: 27 mmol/L (ref 22–32)
Calcium: 9.6 mg/dL (ref 8.9–10.3)
Chloride: 106 mmol/L (ref 101–111)
Creatinine, Ser: 0.84 mg/dL (ref 0.44–1.00)
GFR calc Af Amer: >60 mL/min (ref >60)
GFR calc non Af Amer: >60 mL/min (ref >60)
Glucose, Bld: 102 mg/dL — ABNORMAL HIGH (ref 65–99)
Potassium: 4.2 mmol/L (ref 3.5–5.1)
Sodium: 140 mmol/L (ref 135–145)
Total Bilirubin: 0.7 mg/dL (ref 0.3–1.2)
Total Protein: 7.2 g/dL (ref 6.5–8.1)

## 2017-05-23 ENCOUNTER — Encounter: Payer: Self-pay | Admitting: Physician Assistant

## 2017-05-23 ENCOUNTER — Ambulatory Visit: Payer: Self-pay | Admitting: Physician Assistant

## 2017-05-23 VITALS — BP 118/80 | HR 77 | Temp 97.5°F | Ht 61.0 in | Wt 165.0 lb

## 2017-05-23 DIAGNOSIS — K589 Irritable bowel syndrome without diarrhea: Secondary | ICD-10-CM

## 2017-05-23 DIAGNOSIS — E785 Hyperlipidemia, unspecified: Secondary | ICD-10-CM

## 2017-05-23 DIAGNOSIS — F32A Depression, unspecified: Secondary | ICD-10-CM

## 2017-05-23 DIAGNOSIS — G8929 Other chronic pain: Secondary | ICD-10-CM

## 2017-05-23 DIAGNOSIS — F329 Major depressive disorder, single episode, unspecified: Secondary | ICD-10-CM

## 2017-05-23 NOTE — Progress Notes (Signed)
BP 118/80 (BP Location: Left Arm, Patient Position: Sitting, Cuff Size: Normal)   Pulse 77   Temp (!) 97.5 F (36.4 C) (Other (Comment))   Ht 5\' 1"  (1.549 m)   Wt 165 lb (74.8 kg)   LMP 01/02/2010   SpO2 98%   BMI 31.18 kg/m    Subjective:    Patient ID: Julia [name], female    DOB: 08/18/1961, 56 y.o.   MRN: [id number]  HPI: Julia [name] is a 56 y.o. female presenting on 05/23/2017 for Hyperlipidemia   HPI   Pt is still going to [employer] says she has appt with GI on Monday for IBS ( at [facility]) She says she is working on getting appt to see rheumatologist there.   Pt is doing mammogram every other year and she is s/p hysterectomy  Relevant past medical, surgical, family and social history reviewed and updated as indicated. Interim medical history since our last visit reviewed. Allergies and medications reviewed and updated.   Current Outpatient Prescriptions:  .  ALPRAZolam (XANAX) 0.5 MG tablet, Take 0.5 mg by mouth as needed for anxiety (panic)., Disp: , Rfl:  .  amitriptyline (ELAVIL) 25 MG tablet, Take 12.5 mg by mouth at bedtime. , Disp: , Rfl:  .  atorvastatin (LIPITOR) 20 MG tablet, Take 1 tablet (20 mg total) by mouth at bedtime., Disp: 90 tablet, Rfl: 2 .  buPROPion (WELLBUTRIN XL) 150 MG 24 hr tablet, Take 150 mg by mouth daily., Disp: , Rfl:  .  Calcium-Vitamin D-Vitamin K (CALCIUM SOFT CHEWS PO), Take 1 tablet by mouth 2 (two) times daily., Disp: , Rfl:  .  gabapentin (NEURONTIN) 100 MG capsule, Take 100-200 mg by mouth at bedtime., Disp: , Rfl:  .  meloxicam (MOBIC) 15 MG tablet, Take 1 tablet (15 mg total) by mouth daily as needed., Disp: 30 tablet, Rfl: 1 .  omeprazole (PRILOSEC) 20 MG capsule, Take 1 capsule (20 mg total) by mouth daily., Disp: 90 capsule, Rfl: 3 .  prazosin (MINIPRESS) 1 MG capsule, Take 1-2 mg by mouth at bedtime., Disp: , Rfl:  .  sertraline (ZOLOFT) 100 MG tablet, Take 200 mg by mouth daily. , Disp: , Rfl:    Review of  Systems  Constitutional: Positive for fatigue. Negative for appetite change, chills, diaphoresis, fever and unexpected weight change.  HENT: Positive for dental problem. Negative for congestion, drooling, ear pain, facial swelling, hearing loss, mouth sores, sneezing, sore throat, trouble swallowing and voice change.   Eyes: Positive for itching. Negative for pain, discharge, redness and visual disturbance.  Respiratory: Negative for cough, choking, shortness of breath and wheezing.   Cardiovascular: Negative for chest pain, palpitations and leg swelling.  Gastrointestinal: Positive for abdominal pain and constipation. Negative for blood in stool, diarrhea and vomiting.  Endocrine: Positive for heat intolerance. Negative for cold intolerance and polydipsia.  Genitourinary: Negative for decreased urine volume, dysuria and hematuria.  Musculoskeletal: Positive for arthralgias and gait problem. Negative for back pain.  Skin: Negative for rash.  Allergic/Immunologic: Negative for environmental allergies.  Neurological: Positive for headaches. Negative for seizures, syncope and light-headedness.  Hematological: Negative for adenopathy.  Psychiatric/Behavioral: Positive for agitation, dysphoric mood and suicidal ideas. The patient is nervous/anxious.     Per HPI unless specifically indicated above     Objective:    BP 118/80 (BP Location: Left Arm, Patient Position: Sitting, Cuff Size: Normal)   Pulse 77   Temp (!) 97.5 F (36.4 C) (Other (Comment))  Ht 5\' 1"  (1.549 m)   Wt 165 lb (74.8 kg)   LMP 01/02/2010   SpO2 98%   BMI 31.18 kg/m   Wt Readings from Last 3 Encounters:  05/23/17 165 lb (74.8 kg)  02/12/17 169 lb (76.7 kg)  11/15/16 174 lb (78.9 kg)    Physical Exam  Constitutional: She is oriented to person, place, and time. She appears well-developed and well-nourished.  HENT:  Head: Normocephalic and atraumatic.  Neck: Neck supple.  Cardiovascular: Normal rate and regular  rhythm.   Pulmonary/Chest: Effort normal and breath sounds normal.  Abdominal: Soft. Bowel sounds are normal. She exhibits no mass. There is no hepatosplenomegaly. There is no tenderness.  Musculoskeletal: She exhibits no edema.  Lymphadenopathy:    She has no cervical adenopathy.  Neurological: She is alert and oriented to person, place, and time.  Skin: Skin is warm and dry.  Psychiatric: She has a normal mood and affect. Her behavior is normal.  Vitals reviewed.   Results for orders placed or performed during the hospital encounter of 05/15/17  Comprehensive metabolic panel  Result Value Ref Range   Sodium 140 135 - 145 mmol/L   Potassium 4.2 3.5 - 5.1 mmol/L   Chloride 106 101 - 111 mmol/L   CO2 27 22 - 32 mmol/L   Glucose, Bld 102 (H) 65 - 99 mg/dL   BUN 19 6 - 20 mg/dL   Creatinine, Ser 9.14 0.44 - 1.00 mg/dL   Calcium 9.6 8.9 - 78.2 mg/dL   Total Protein 7.2 6.5 - 8.1 g/dL   Albumin 4.3 3.5 - 5.0 g/dL   AST 24 15 - 41 U/L   ALT 16 14 - 54 U/L   Alkaline Phosphatase 52 38 - 126 U/L   Total Bilirubin 0.7 0.3 - 1.2 mg/dL   GFR calc non Af Amer >60 >60 mL/min   GFR calc Af Amer >60 >60 mL/min   Anion gap 7 5 - 15  Lipid panel  Result Value Ref Range   Cholesterol 167 0 - 200 mg/dL   Triglycerides 84 <956 mg/dL   HDL 64 >21 mg/dL   Total CHOL/HDL Ratio 2.6 RATIO   VLDL 17 0 - 40 mg/dL   LDL Cholesterol 86 0 - 99 mg/dL      Assessment & Plan:   Encounter Diagnoses  Name Primary?  . Hyperlipidemia, unspecified hyperlipidemia type Yes  . Other chronic pain   . Depression, unspecified depression type   . Irritable bowel syndrome, unspecified type      -reviewed labs with pt -pt to continue current medications -pt to follow up 6 months.  RTO sooner prn

## 2017-07-01 ENCOUNTER — Other Ambulatory Visit: Payer: Self-pay | Admitting: Physician Assistant

## 2017-07-08 ENCOUNTER — Other Ambulatory Visit: Payer: Self-pay | Admitting: Physician Assistant

## 2017-07-16 ENCOUNTER — Ambulatory Visit: Payer: Self-pay | Admitting: Physician Assistant

## 2017-10-02 ENCOUNTER — Other Ambulatory Visit: Payer: Self-pay | Admitting: Physician Assistant

## 2017-11-25 ENCOUNTER — Encounter: Payer: Self-pay | Admitting: Physician Assistant

## 2017-11-25 ENCOUNTER — Ambulatory Visit: Payer: Self-pay | Admitting: Physician Assistant

## 2017-11-25 VITALS — BP 116/75 | HR 74 | Temp 97.7°F | Ht 61.0 in | Wt 165.0 lb

## 2017-11-25 DIAGNOSIS — M25511 Pain in right shoulder: Secondary | ICD-10-CM

## 2017-11-25 DIAGNOSIS — F329 Major depressive disorder, single episode, unspecified: Secondary | ICD-10-CM

## 2017-11-25 DIAGNOSIS — K219 Gastro-esophageal reflux disease without esophagitis: Secondary | ICD-10-CM

## 2017-11-25 DIAGNOSIS — Z1239 Encounter for other screening for malignant neoplasm of breast: Secondary | ICD-10-CM

## 2017-11-25 DIAGNOSIS — E785 Hyperlipidemia, unspecified: Secondary | ICD-10-CM

## 2017-11-25 DIAGNOSIS — F32A Depression, unspecified: Secondary | ICD-10-CM

## 2017-11-25 DIAGNOSIS — G8929 Other chronic pain: Secondary | ICD-10-CM

## 2017-11-25 NOTE — Progress Notes (Signed)
BP 116/75 (BP Location: Right Arm, Patient Position: Sitting, Cuff Size: Normal)   Pulse 74   Temp 97.7 F (36.5 C) (Oral)   Ht 5\' 1"  (1.549 m)   Wt 165 lb (74.8 kg)   LMP 01/02/2010   SpO2 97%   BMI 31.18 kg/m    Subjective:    Patient ID: [name], female    DOB: December 13, 1960, 57 y.o.   MRN: [id number]  HPI: SHERISSA TENENBAUM is a 57 y.o. female presenting on 11/25/2017 for Hyperlipidemia   HPI  Pt is still going to [facility] for MH issues  Pt says she went to GI for IBS ( at [facility]).  She says she is working on reapplying for financial assistance so she can get appointment to follow up.   She says she is working on getting appt to see rheumatologist there.   Pt is doing mammogram every other year and she is s/p hysterectomy  Pt says she was denied for cone discount.  She says last time she applied was when she had the shoulder MRI which was 2016.   She says she doesn't know why she was denied.   Pt complains of her R shoulder hurting again.  This has been on ongoing, waxing and waning issue for her for several years.   She had MRI of the shoulder in 2016.  Relevant past medical, surgical, family and social history reviewed and updated as indicated. Interim medical history since our last visit reviewed. Allergies and medications reviewed and updated.   Current Outpatient Medications:  .  amitriptyline (ELAVIL) 25 MG tablet, Take 12.5 mg by mouth at bedtime. , Disp: , Rfl:  .  atorvastatin (LIPITOR) 20 MG tablet, TAKE 1 TABLET BY MOUTH EVERY NIGHT AT BEDTIME, Disp: 90 tablet, Rfl: 1 .  buPROPion (WELLBUTRIN XL) 150 MG 24 hr tablet, Take 150 mg by mouth daily., Disp: , Rfl:  .  Calcium-Vitamin D-Vitamin K (CALCIUM SOFT CHEWS PO), Take 1 tablet by mouth 2 (two) times daily., Disp: , Rfl:  .  gabapentin (NEURONTIN) 100 MG capsule, Take 100-200 mg by mouth at bedtime., Disp: , Rfl:  .  meloxicam (MOBIC) 15 MG tablet, TAKE 1 TABLET BY MOUTH ONCE DAILY AS NEEDED FOR PAIN,  Disp: 30 tablet, Rfl: 2 .  omeprazole (PRILOSEC) 20 MG capsule, Take 1 capsule (20 mg total) by mouth daily., Disp: 90 capsule, Rfl: 3 .  prazosin (MINIPRESS) 1 MG capsule, Take 1-2 mg by mouth at bedtime., Disp: , Rfl:  .  sertraline (ZOLOFT) 100 MG tablet, Take 200 mg by mouth daily. , Disp: , Rfl:    Review of Systems  Constitutional: Positive for diaphoresis and fatigue. Negative for appetite change, chills, fever and unexpected weight change.  HENT: Negative for congestion, dental problem, drooling, ear pain, facial swelling, hearing loss, mouth sores, sneezing, sore throat, trouble swallowing and voice change.   Eyes: Negative for pain, discharge, redness, itching and visual disturbance.  Respiratory: Negative for cough, choking, shortness of breath and wheezing.   Cardiovascular: Negative for chest pain, palpitations and leg swelling.  Gastrointestinal: Positive for abdominal pain and constipation. Negative for blood in stool, diarrhea and vomiting.  Endocrine: Positive for heat intolerance. Negative for cold intolerance and polydipsia.  Genitourinary: Negative for decreased urine volume, dysuria and hematuria.  Musculoskeletal: Positive for arthralgias and gait problem. Negative for back pain.  Skin: Negative for rash.  Allergic/Immunologic: Positive for environmental allergies.  Neurological: Positive for headaches. Negative for seizures, syncope  and light-headedness.  Hematological: Negative for adenopathy.  Psychiatric/Behavioral: Positive for agitation, dysphoric mood and suicidal ideas. The patient is nervous/anxious.     Per HPI unless specifically indicated above     Objective:    BP 116/75 (BP Location: Right Arm, Patient Position: Sitting, Cuff Size: Normal)   Pulse 74   Temp 97.7 F (36.5 C) (Oral)   Ht 5\' 1"  (1.549 m)   Wt 165 lb (74.8 kg)   LMP 01/02/2010   SpO2 97%   BMI 31.18 kg/m   Wt Readings from Last 3 Encounters:  11/25/17 165 lb (74.8 kg)  05/23/17  165 lb (74.8 kg)  02/12/17 169 lb (76.7 kg)    Physical Exam  Constitutional: She is oriented to person, place, and time. She appears well-developed and well-nourished.  HENT:  Head: Normocephalic and atraumatic.  Neck: Neck supple.  Cardiovascular: Normal rate and regular rhythm.  Pulmonary/Chest: Effort normal and breath sounds normal.  Abdominal: Soft. Bowel sounds are normal. She exhibits no mass. There is no hepatosplenomegaly. There is no tenderness.  Musculoskeletal: She exhibits no edema.       Right shoulder: She exhibits decreased range of motion and tenderness. She exhibits no bony tenderness, no swelling, no effusion, no crepitus, no deformity and normal pulse.  Abduction limited to about 95 degrees, extension to about 90 degrees. internal rotation is painful.  Lymphadenopathy:    She has no cervical adenopathy.  Neurological: She is alert and oriented to person, place, and time.  Skin: Skin is warm and dry.  Psychiatric: She has a normal mood and affect. Her behavior is normal.  Vitals reviewed.       Assessment & Plan:    Encounter Diagnoses  Name Primary?  . Hyperlipidemia, unspecified hyperlipidemia type Yes  . Chronic right shoulder pain   . Screening for breast cancer   . Gastroesophageal reflux disease, esophagitis presence not specified   . Other chronic pain   . Depression, unspecified depression type     -pt to get fasting labs tomorrow.  Will call with results -will order screening mammogram -[facility] and [facility].  -pt to resubmit cone discount application.  She is to Notify us when she gets it so we have her come in for evaluation and discuss imaging and/or referral to orthopedics for the shoulder -[facility] -pt to follow up in 3 months. RTO sooner prn

## 2017-11-27 ENCOUNTER — Other Ambulatory Visit (HOSPITAL_COMMUNITY)
Admission: RE | Admit: 2017-11-27 | Discharge: 2017-11-27 | Disposition: A | Discharge status: Home / Self Care | Payer: Self-pay | Source: Ambulatory Visit | Attending: Physician Assistant | Admitting: Physician Assistant

## 2017-11-27 ENCOUNTER — Other Ambulatory Visit: Payer: Self-pay | Admitting: Physician Assistant

## 2017-11-27 DIAGNOSIS — E785 Hyperlipidemia, unspecified: Secondary | ICD-10-CM | POA: Insufficient documentation

## 2017-11-27 DIAGNOSIS — Z1231 Encounter for screening mammogram for malignant neoplasm of breast: Secondary | ICD-10-CM

## 2017-11-27 DIAGNOSIS — R7989 Other specified abnormal findings of blood chemistry: Secondary | ICD-10-CM

## 2017-11-27 LAB — COMPREHENSIVE METABOLIC PANEL
ALT: 21 U/L (ref 14–54)
AST: 20 U/L (ref 15–41)
Albumin: 4.3 g/dL (ref 3.5–5.0)
Alkaline Phosphatase: 63 U/L (ref 38–126)
Anion gap: 9 (ref 5–15)
BUN: 20 mg/dL (ref 6–20)
CO2: 26 mmol/L (ref 22–32)
Calcium: 9.8 mg/dL (ref 8.9–10.3)
Chloride: 103 mmol/L (ref 101–111)
Creatinine, Ser: 1.06 mg/dL — ABNORMAL HIGH (ref 0.44–1.00)
GFR calc Af Amer: >60 mL/min (ref >60)
GFR calc non Af Amer: 58 mL/min — ABNORMAL LOW (ref >60)
Glucose, Bld: 109 mg/dL — ABNORMAL HIGH (ref 65–99)
Potassium: 4.4 mmol/L (ref 3.5–5.1)
Sodium: 138 mmol/L (ref 135–145)
Total Bilirubin: 0.5 mg/dL (ref 0.3–1.2)
Total Protein: 7.3 g/dL (ref 6.5–8.1)

## 2017-11-27 LAB — LIPID PANEL
Cholesterol: 192 mg/dL (ref 0–200)
HDL: 62 mg/dL (ref >40)
LDL Cholesterol: 100 mg/dL — ABNORMAL HIGH (ref 0–99)
Total CHOL/HDL Ratio: 3.1 RATIO
Triglycerides: 152 mg/dL — ABNORMAL HIGH (ref <150)
VLDL: 30 mg/dL (ref 0–40)

## 2018-01-06 ENCOUNTER — Other Ambulatory Visit: Payer: Self-pay | Admitting: Physician Assistant

## 2018-01-09 ENCOUNTER — Ambulatory Visit
Admission: RE | Admit: 2018-01-09 | Discharge: 2018-01-09 | Disposition: A | Discharge status: Home / Self Care | Payer: No Typology Code available for payment source | Source: Ambulatory Visit | Attending: Physician Assistant | Admitting: Physician Assistant

## 2018-01-09 DIAGNOSIS — Z1231 Encounter for screening mammogram for malignant neoplasm of breast: Secondary | ICD-10-CM

## 2018-02-05 ENCOUNTER — Other Ambulatory Visit: Payer: Self-pay | Admitting: Physician Assistant

## 2018-03-13 ENCOUNTER — Other Ambulatory Visit: Payer: Self-pay | Admitting: Physician Assistant

## 2018-04-08 ENCOUNTER — Other Ambulatory Visit: Payer: Self-pay | Admitting: Physician Assistant

## 2018-04-08 MED ORDER — MELOXICAM 15 MG PO TABS: 15.0000 mg | ORAL_TABLET | Freq: Every day | ORAL | 0 refills | Status: DC | PRN

## 2018-04-08 MED ORDER — OMEPRAZOLE 20 MG PO CPDR: 20.0000 mg | DELAYED_RELEASE_CAPSULE | Freq: Every day | ORAL | 3 refills | Status: DC

## 2018-04-08 MED ORDER — ATORVASTATIN CALCIUM 20 MG PO TABS: 20.0000 mg | ORAL_TABLET | Freq: Every day | ORAL | 1 refills | Status: DC

## 2018-05-02 ENCOUNTER — Other Ambulatory Visit: Payer: Self-pay | Admitting: Physician Assistant

## 2018-05-26 ENCOUNTER — Ambulatory Visit: Payer: Self-pay | Admitting: Physician Assistant

## 2018-06-03 ENCOUNTER — Other Ambulatory Visit: Payer: Self-pay | Admitting: Physician Assistant

## 2018-06-04 ENCOUNTER — Other Ambulatory Visit (HOSPITAL_COMMUNITY)
Admission: RE | Admit: 2018-06-04 | Discharge: 2018-06-04 | Disposition: A | Discharge status: Home / Self Care | Payer: No Typology Code available for payment source | Source: Ambulatory Visit | Attending: Physician Assistant | Admitting: Physician Assistant

## 2018-06-04 DIAGNOSIS — R7989 Other specified abnormal findings of blood chemistry: Secondary | ICD-10-CM | POA: Insufficient documentation

## 2018-06-04 DIAGNOSIS — E785 Hyperlipidemia, unspecified: Secondary | ICD-10-CM

## 2018-06-04 LAB — COMPREHENSIVE METABOLIC PANEL
ALT: 18 U/L (ref 0–44)
AST: 20 U/L (ref 15–41)
Albumin: 4.2 g/dL (ref 3.5–5.0)
Alkaline Phosphatase: 64 U/L (ref 38–126)
Anion gap: 8 (ref 5–15)
BUN: 21 mg/dL — ABNORMAL HIGH (ref 6–20)
CO2: 25 mmol/L (ref 22–32)
Calcium: 9.6 mg/dL (ref 8.9–10.3)
Chloride: 108 mmol/L (ref 98–111)
Creatinine, Ser: 0.94 mg/dL (ref 0.44–1.00)
GFR calc Af Amer: >60 mL/min (ref >60)
GFR calc non Af Amer: >60 mL/min (ref >60)
Glucose, Bld: 101 mg/dL — ABNORMAL HIGH (ref 70–99)
Potassium: 4.7 mmol/L (ref 3.5–5.1)
Sodium: 141 mmol/L (ref 135–145)
Total Bilirubin: 0.6 mg/dL (ref 0.3–1.2)
Total Protein: 7.4 g/dL (ref 6.5–8.1)

## 2018-06-04 LAB — LIPID PANEL
Cholesterol: 192 mg/dL (ref 0–200)
HDL: 62 mg/dL (ref >40)
LDL Cholesterol: 114 mg/dL — ABNORMAL HIGH (ref 0–99)
Total CHOL/HDL Ratio: 3.1 RATIO
Triglycerides: 82 mg/dL (ref <150)
VLDL: 16 mg/dL (ref 0–40)

## 2018-06-10 ENCOUNTER — Encounter: Payer: Self-pay | Admitting: Physician Assistant

## 2018-06-10 ENCOUNTER — Ambulatory Visit: Payer: Self-pay | Admitting: Physician Assistant

## 2018-06-10 VITALS — BP 128/88 | HR 70 | Temp 97.7°F | Ht 61.0 in | Wt 168.5 lb

## 2018-06-10 DIAGNOSIS — K589 Irritable bowel syndrome without diarrhea: Secondary | ICD-10-CM

## 2018-06-10 DIAGNOSIS — H9202 Otalgia, left ear: Secondary | ICD-10-CM

## 2018-06-10 DIAGNOSIS — K219 Gastro-esophageal reflux disease without esophagitis: Secondary | ICD-10-CM

## 2018-06-10 DIAGNOSIS — G8929 Other chronic pain: Secondary | ICD-10-CM

## 2018-06-10 DIAGNOSIS — E785 Hyperlipidemia, unspecified: Secondary | ICD-10-CM

## 2018-06-10 DIAGNOSIS — F39 Unspecified mood [affective] disorder: Secondary | ICD-10-CM

## 2018-06-10 NOTE — Progress Notes (Signed)
BP 128/88 (BP Location: Left Arm, Patient Position: Sitting, Cuff Size: Normal)   Pulse 70   Temp 97.7 F (36.5 C)   Ht 5\' 1"  (1.549 m)   Wt 168 lb 8 oz (76.4 kg)   LMP 01/02/2010   SpO2 98%   BMI 31.84 kg/m    Subjective:    Patient ID: Julia [name], female    DOB: 22-May-1961, 57 y.o.   MRN: [id number]  HPI: Julia [name] is a 57 y.o. female presenting on 06/10/2018 for Hyperlipidemia   HPI   Pt is still going to [facility] for MH issues  She has been unable to get financial office at [facility] to call her about financial assistance.  She needs to see the GI and rheumatologist there.   Pt complains of L ear pain, HA, body ache, cough, L eye drainage. sx began yesterday. pt has been taking tylenol but does not help    Relevant past medical, surgical, family and social history reviewed and updated as indicated. Interim medical history since our last visit reviewed. Allergies and medications reviewed and updated.   Current Outpatient Medications:  .  amitriptyline (ELAVIL) 25 MG tablet, Take 10-20 mg by mouth at bedtime. , Disp: , Rfl:  .  APPLE CIDER VINEGAR PO, Take 1 tablet by mouth daily., Disp: , Rfl:  .  atorvastatin (LIPITOR) 20 MG tablet, TAKE 1 Tablet BY MOUTH EVERY NIGHT AT BEDTIME, Disp: 90 tablet, Rfl: 0 .  buPROPion (WELLBUTRIN XL) 150 MG 24 hr tablet, Take 300 mg by mouth daily. , Disp: , Rfl:  .  Calcium-Vitamin D-Vitamin K (CALCIUM SOFT CHEWS PO), Take 1 tablet by mouth 2 (two) times daily., Disp: , Rfl:  .  gabapentin (NEURONTIN) 100 MG capsule, Take 100-200 mg by mouth at bedtime., Disp: , Rfl:  .  meloxicam (MOBIC) 15 MG tablet, TAKE 1 Tablet BY MOUTH ONCE DAILY AS NEEDED FOR PAIN, Disp: 30 tablet, Rfl: 0 .  Oil of Oregano 1500 MG CAPS, Take 1 capsule by mouth daily., Disp: , Rfl:  .  omeprazole (PRILOSEC) 20 MG capsule, Take 1 capsule (20 mg total) by mouth daily. (Patient taking differently: Take 20 mg by mouth 2 (two) times daily. ), Disp: 90 capsule,  Rfl: 3 .  Probiotic Product (PROBIOTIC DAILY PO), Take 1 tablet by mouth daily., Disp: , Rfl:  .  sertraline (ZOLOFT) 100 MG tablet, Take 200 mg by mouth daily. , Disp: , Rfl:    Review of Systems  Constitutional: Positive for diaphoresis and fatigue. Negative for appetite change, chills, fever and unexpected weight change.  HENT: Positive for ear pain and sore throat. Negative for congestion, drooling, facial swelling, hearing loss, mouth sores, sneezing, trouble swallowing and voice change.   Eyes: Positive for discharge and itching. Negative for pain, redness and visual disturbance.  Respiratory: Positive for cough. Negative for choking, shortness of breath and wheezing.   Cardiovascular: Negative for chest pain, palpitations and leg swelling.  Gastrointestinal: Positive for abdominal pain and constipation. Negative for blood in stool, diarrhea and vomiting.  Endocrine: Positive for heat intolerance. Negative for cold intolerance and polydipsia.  Genitourinary: Negative for decreased urine volume, dysuria and hematuria.  Musculoskeletal: Positive for gait problem. Negative for arthralgias and back pain.  Skin: Negative for rash.  Allergic/Immunologic: Negative for environmental allergies.  Neurological: Positive for headaches. Negative for seizures, syncope and light-headedness.  Hematological: Negative for adenopathy.  Psychiatric/Behavioral: Positive for agitation and dysphoric mood. Negative for suicidal ideas. The  patient is nervous/anxious.     Per HPI unless specifically indicated above     Objective:    BP 128/88 (BP Location: Left Arm, Patient Position: Sitting, Cuff Size: Normal)   Pulse 70   Temp 97.7 F (36.5 C)   Ht 5\' 1"  (1.549 m)   Wt 168 lb 8 oz (76.4 kg)   LMP 01/02/2010   SpO2 98%   BMI 31.84 kg/m   Wt Readings from Last 3 Encounters:  06/10/18 168 lb 8 oz (76.4 kg)  11/25/17 165 lb (74.8 kg)  05/23/17 165 lb (74.8 kg)    Physical Exam  Constitutional:  She is oriented to person, place, and time. She appears well-developed and well-nourished.  HENT:  Head: Normocephalic and atraumatic.  Right Ear: Hearing, tympanic membrane, external ear and ear canal normal.  Left Ear: Hearing, tympanic membrane, external ear and ear canal normal.  Nose: Nose normal.  Mouth/Throat: Uvula is midline and oropharynx is clear and moist. No oropharyngeal exudate.  Neck: Neck supple.  Cardiovascular: Normal rate and regular rhythm.  Pulmonary/Chest: Effort normal and breath sounds normal. She has no wheezes.  Abdominal: Soft. Bowel sounds are normal. She exhibits no mass. There is no hepatosplenomegaly. There is no tenderness.  Musculoskeletal: She exhibits no edema.  Lymphadenopathy:    She has no cervical adenopathy.  Neurological: She is alert and oriented to person, place, and time.  Skin: Skin is warm and dry.  Psychiatric: She has a normal mood and affect. Her behavior is normal.  Vitals reviewed.   Results for orders placed or performed during the hospital encounter of 06/04/18  Comprehensive metabolic panel  Result Value Ref Range   Sodium 141 135 - 145 mmol/L   Potassium 4.7 3.5 - 5.1 mmol/L   Chloride 108 98 - 111 mmol/L   CO2 25 22 - 32 mmol/L   Glucose, Bld 101 (H) 70 - 99 mg/dL   BUN 21 (H) 6 - 20 mg/dL   Creatinine, Ser 0.98 0.44 - 1.00 mg/dL   Calcium 9.6 8.9 - 11.9 mg/dL   Total Protein 7.4 6.5 - 8.1 g/dL   Albumin 4.2 3.5 - 5.0 g/dL   AST 20 15 - 41 U/L   ALT 18 0 - 44 U/L   Alkaline Phosphatase 64 38 - 126 U/L   Total Bilirubin 0.6 0.3 - 1.2 mg/dL   GFR calc non Af Amer >60 >60 mL/min   GFR calc Af Amer >60 >60 mL/min   Anion gap 8 5 - 15  Lipid panel  Result Value Ref Range   Cholesterol 192 0 - 200 mg/dL   Triglycerides 82 <147 mg/dL   HDL 62 >82 mg/dL   Total CHOL/HDL Ratio 3.1 RATIO   VLDL 16 0 - 40 mg/dL   LDL Cholesterol 956 (H) 0 - 99 mg/dL      Assessment & Plan:   Encounter Diagnoses  Name Primary?  .  Hyperlipidemia, unspecified hyperlipidemia type Yes  . Otalgia of left ear   . Gastroesophageal reflux disease, esophagitis presence not specified   . Other chronic pain   . Irritable bowel syndrome, unspecified type   . Mood disorder (HCC)     -reviewed labs with pt -pt to Continue current for cholesterol -pt has application that she will Resubmit for financial assistance for [facility].  If haven't heard anything in several weeks, she will call and get help from our nurse -she will continue with [facility] for MH issues -pt to follow up  6 months.  RTO sooner prn

## 2018-06-10 NOTE — Patient Instructions (Signed)
[facility] [address] [place], [place] [address] [phone]

## 2018-06-30 ENCOUNTER — Other Ambulatory Visit: Payer: Self-pay | Admitting: Physician Assistant

## 2018-08-04 ENCOUNTER — Other Ambulatory Visit: Payer: Self-pay | Admitting: Physician Assistant

## 2018-09-01 ENCOUNTER — Other Ambulatory Visit: Payer: Self-pay | Admitting: Physician Assistant

## 2018-09-29 ENCOUNTER — Other Ambulatory Visit: Payer: Self-pay | Admitting: Physician Assistant

## 2018-10-30 ENCOUNTER — Other Ambulatory Visit: Payer: Self-pay | Admitting: Physician Assistant

## 2018-11-23 ENCOUNTER — Encounter (HOSPITAL_COMMUNITY): Payer: Self-pay | Admitting: Emergency Medicine

## 2018-11-23 ENCOUNTER — Emergency Department (HOSPITAL_COMMUNITY)
Admission: EM | Admit: 2018-11-23 | Discharge: 2018-11-24 | Disposition: A | Discharge status: Home / Self Care | Payer: No Typology Code available for payment source | Attending: Emergency Medicine | Admitting: Emergency Medicine

## 2018-11-23 ENCOUNTER — Other Ambulatory Visit: Payer: Self-pay

## 2018-11-23 DIAGNOSIS — F329 Major depressive disorder, single episode, unspecified: Secondary | ICD-10-CM | POA: Insufficient documentation

## 2018-11-23 DIAGNOSIS — M5412 Radiculopathy, cervical region: Secondary | ICD-10-CM | POA: Insufficient documentation

## 2018-11-23 DIAGNOSIS — Z79899 Other long term (current) drug therapy: Secondary | ICD-10-CM | POA: Insufficient documentation

## 2018-11-23 DIAGNOSIS — F411 Generalized anxiety disorder: Secondary | ICD-10-CM | POA: Insufficient documentation

## 2018-11-23 DIAGNOSIS — Z87891 Personal history of nicotine dependence: Secondary | ICD-10-CM | POA: Insufficient documentation

## 2018-11-23 MED ORDER — KETOROLAC TROMETHAMINE 60 MG/2ML IM SOLN
60.0000 mg | Freq: Once | INTRAMUSCULAR | Status: AC
  Administered 2018-11-24: 60 mg via INTRAMUSCULAR
  Filled 2018-11-23: qty 2

## 2018-11-23 MED ORDER — ONDANSETRON 4 MG PO TBDP
8.0000 mg | ORAL_TABLET | Freq: Once | ORAL | Status: AC
Start: 2018-11-24 — End: 2018-11-24
  Administered 2018-11-24: 8 mg via ORAL
  Filled 2018-11-23: qty 2

## 2018-11-23 MED ORDER — HYDROMORPHONE HCL 1 MG/ML IJ SOLN
1.0000 mg | Freq: Once | INTRAMUSCULAR | Status: AC
  Administered 2018-11-24: 1 mg via INTRAMUSCULAR
  Filled 2018-11-23: qty 1

## 2018-11-23 NOTE — ED Triage Notes (Addendum)
C/o pain to posterior neck and pain that radiates "up" R arm and on L arm for 6 months.  Pain worse today.  History of C6 fusion in 2013.

## 2018-11-23 NOTE — ED Provider Notes (Signed)
[facility] EMERGENCY DEPARTMENT Provider Note   CSN: [id number] Arrival date & time: 11/23/18  2107    History   Chief Complaint Chief Complaint  Patient presents with  . Neck Pain  . Arm Pain    HPI Julia [name] is a 58 y.o. female.     Patient presents to the emergency department for evaluation of neck pain.  Patient reports that she has a history of spinal fusion in 2013.  She had continued problems after the surgery, had CT myelogram and MRI performed approximately a year later and was told that her back look worse and she needed to "live with the pain".  Over the last 6 months she has noticed increasing pain in the neck.  At times it feels like a tingling or an electric current going through the neck and she has had intermittent episodes of numbness in her hands and now is experiencing severe pain in the left and posterior aspect of the neck that goes down the arms.  She denies any recent injury.     Past Medical History:  Diagnosis Date  . Arthritis   . Complication of anesthesia    slow to wake up-does not take much medicine to sedate her  . Paget disease of bone   . Wears glasses     Patient Active Problem List   Diagnosis Date Noted  . Chronic pain 02/10/2016  . Esophageal reflux 08/15/2015  . Hyperlipidemia 08/15/2015  . Chronic pain in shoulder 08/15/2015  . Depression 11/25/2013  . GAD (generalized anxiety disorder) 11/25/2013  . Diarrhea 01/02/2013  . IBS (irritable bowel syndrome) 01/02/2013    Past Surgical History:  Procedure Laterality Date  . ABDOMINAL HYSTERECTOMY  8/11   TAH  . CERVICAL SPINE SURGERY  1/13  . DILATION AND CURETTAGE OF UTERUS  1986  . KNEE ARTHROSCOPY Left 07/22/2013   Procedure: LEFT KNEE ARTHROSCOPY WITH DEBRIDEMENT CHONDROPLASTY, REMOVAL LOOSE BODIES, PARTIAL MEDIAL MENISCECTOMY, OPEN EXCISION OF PES GANGLION;  Surgeon: [name], MD;  Location: [facility];  Service: Orthopedics;   Laterality: Left;  NO SPECIMEN SENT PER DR. [name] ORDER.  . TUBAL LIGATION  1989     OB History   No obstetric history on file.      Home Medications    Prior to Admission medications   Medication Sig Start Date End Date Taking? Authorizing Provider  amitriptyline (ELAVIL) 25 MG tablet Take 10-20 mg by mouth at bedtime.     [provider]  APPLE CIDER VINEGAR PO Take 1 tablet by mouth daily.    [provider]  atorvastatin (LIPITOR) 20 MG tablet TAKE 1 Tablet BY MOUTH EVERY NIGHT AT BEDTIME 10/30/18   Jacquelin Hawking, PA-C  buPROPion (WELLBUTRIN XL) 150 MG 24 hr tablet Take 300 mg by mouth daily.     [provider]  Calcium-Vitamin D-Vitamin K (CALCIUM SOFT CHEWS PO) Take 1 tablet by mouth 2 (two) times daily.    [provider]  gabapentin (NEURONTIN) 100 MG capsule Take 100-200 mg by mouth at bedtime.    [provider]  meloxicam (MOBIC) 15 MG tablet TAKE 1 Tablet BY MOUTH ONCE DAILY AS NEEDED FOR PAIN 10/30/18   Jacquelin Hawking, PA-C  Oil of Oregano 1500 MG CAPS Take 1 capsule by mouth daily.    [provider]  omeprazole (PRILOSEC) 20 MG capsule TAKE 1 Capsule  BY MOUTH TWICE DAILY AS NEEDED 10/30/18   Jacquelin Hawking, PA-C  Probiotic Product (PROBIOTIC DAILY PO) Take 1 tablet by mouth daily.    [provider]  sertraline (ZOLOFT) 100 MG tablet Take 200 mg by mouth daily.     [provider]    Family History Family History  Problem Relation Age of Onset  . Thyroid disease Mother   . Congestive Heart Failure Mother   . Heart disease Mother   . Cancer Sister        melanoma, lung cancer  . Healthy Brother   . Healthy Brother   . Healthy Brother   . Healthy Brother   . Heart disease Father     Social History Social History   Tobacco Use  . Smoking status: Former Smoker    Last attempt to quit: 01/02/1985    Years since quitting: 33.9  . Smokeless tobacco: Never Used  Substance Use Topics    . Alcohol use: Yes    Comment: occassional  . Drug use: No     Allergies   Lamictal [lamotrigine]   Review of Systems Review of Systems  Musculoskeletal: Positive for neck pain.  All other systems reviewed and are negative.    Physical Exam Updated Vital Signs BP (!) 134/91 (BP Location: Right Arm)   Pulse 79   Temp 97.6 F (36.4 C) (Oral)   Resp 18   Ht  (1.549 m)   Wt 72.6 kg   LMP 01/02/2010   SpO2 97%   BMI 30.23 kg/m   Physical Exam Vitals signs and nursing note reviewed.  Constitutional:      General: She is not in acute distress.    Appearance: Normal appearance. She is well-developed.  HENT:     Head: Normocephalic and atraumatic.     Right Ear: Hearing normal.     Left Ear: Hearing normal.     Nose: Nose normal.  Eyes:     Conjunctiva/sclera: Conjunctivae normal.     Pupils: Pupils are equal, round, and reactive to light.  Neck:     Musculoskeletal: Neck supple. Decreased range of motion. Muscular tenderness present.   Cardiovascular:     Rate and Rhythm: Regular rhythm.     Pulses:          Radial pulses are 2+ on the right side and 2+ on the left side.     Heart sounds: S1 normal and S2 normal. No murmur. No friction rub. No gallop.   Pulmonary:     Effort: Pulmonary effort is normal. No respiratory distress.     Breath sounds: Normal breath sounds.  Chest:     Chest wall: No tenderness.  Abdominal:     General: Bowel sounds are normal.     Palpations: Abdomen is soft.     Tenderness: There is no abdominal tenderness. There is no guarding or rebound. Negative signs include Murphy's sign and McBurney's sign.     Hernia: No hernia is present.  Skin:    General: Skin is warm and dry.     Findings: No rash.  Neurological:     Mental Status: She is alert and oriented to person, place, and time.     GCS: GCS eye subscore is 4. GCS verbal subscore is 5. GCS motor subscore is 6.     Cranial Nerves: No cranial nerve deficit.     Sensory:  No sensory deficit.     Coordination: Coordination normal.     Deep Tendon Reflexes:     Reflex Scores:  Tricep reflexes are 1+ on the left side.      Bicep reflexes are 1+ on the left side. Psychiatric:        Speech: Speech normal.        Behavior: Behavior normal.        Thought Content: Thought content normal.      ED Treatments / Results  Labs (all labs ordered are listed, but only abnormal results are displayed) Labs Reviewed - No data to display  EKG None  Radiology Ct Cervical Spine Wo Contrast  Result Date: 11/24/2018 CLINICAL DATA:  58 year old female with radiculopathy and pain extending down right shoulder. History of C5-C6 fusion. EXAM: CT CERVICAL SPINE WITHOUT CONTRAST TECHNIQUE: Multidetector CT imaging of the cervical spine was performed without intravenous contrast. Multiplanar CT image reconstructions were also generated. COMPARISON:  CT of the cervical spine dated 09/01/2013. FINDINGS: Alignment: No acute subluxation. Skull base and vertebrae: No acute fracture. No primary bone lesion or focal pathologic process. Soft tissues and spinal canal: No prevertebral fluid or swelling. No visible canal hematoma. Disc levels: Multilevel degenerative changes most prominent at C4-C5 with endplate irregularity and disc space narrowing. There is C5-C6 ACDF. There is bilateral neural foramina narrowing at C4-C5. Upper chest: Negative. Other: None IMPRESSION: 1. No acute/traumatic cervical spine pathology. 2. Degenerative changes and bilateral neural foramina narrowing at C4-C5. 3. C5-C6 ACDF.  Eighty Electronically Signed   By: Elgie Collard M.D.   On: 11/24/2018 01:00    Procedures Procedures (including critical care time)  Medications Ordered in ED Medications  ketorolac (TORADOL) injection 60 mg (has no administration in time range)  HYDROmorphone (DILAUDID) injection 1 mg (has no administration in time range)  ondansetron (ZOFRAN-ODT) disintegrating tablet 8 mg  (has no administration in time range)     Initial Impression / Assessment and Plan / ED Course  I have reviewed the triage vital signs and the nursing notes.  Pertinent labs & imaging results that were available during my care of the patient were reviewed by me and considered in my medical decision making (see chart for details).        Patient presents to the emergency department for evaluation of neck pain.  Patient has a history of cervical fusion at C5-C6.  She reports this was in 2013.  She has had persistent pain after the surgery.  Symptoms have been worsening over the last 6 months and in the last couple of days she has been having constant pain radiating to the arms.  Her examination is unremarkable other than diffuse neck tenderness with decreased range of motion secondary to pain.  She does not have any motor deficit in the upper extremities, sensation is intact, reflexes are intact.  Patient underwent CT scan of cervical spine that shows degenerative changes, no acute pathology compared to previous images from 2014.  Patient reassured, will be treated for cervical radiculopathy, follow-up with Dr. Lovell Sheehan who did her original surgery.  Final Clinical Impressions(s) / ED Diagnoses   Final diagnoses:  Cervical radiculopathy    ED Discharge Orders    None       Gilda Crease, MD 11/24/18 [phone]

## 2018-11-24 ENCOUNTER — Emergency Department (HOSPITAL_COMMUNITY): Payer: No Typology Code available for payment source

## 2018-11-24 MED ORDER — PREDNISONE 20 MG PO TABS: ORAL_TABLET | ORAL | 0 refills | Status: DC

## 2018-11-24 MED ORDER — HYDROCODONE-ACETAMINOPHEN 5-325 MG PO TABS: 1.0000 | ORAL_TABLET | ORAL | 0 refills | Status: DC | PRN

## 2018-11-24 NOTE — ED Notes (Signed)
Patient verbalizes understanding of discharge instructions. Opportunity for questioning and answers were provided. Armband removed by staff, pt discharged from ED.  

## 2018-12-01 ENCOUNTER — Other Ambulatory Visit: Payer: Self-pay | Admitting: Physician Assistant

## 2018-12-05 ENCOUNTER — Other Ambulatory Visit: Payer: Self-pay

## 2018-12-05 ENCOUNTER — Other Ambulatory Visit (HOSPITAL_COMMUNITY)
Admission: RE | Admit: 2018-12-05 | Discharge: 2018-12-05 | Disposition: A | Discharge status: Home / Self Care | Payer: No Typology Code available for payment source | Source: Ambulatory Visit | Attending: Physician Assistant | Admitting: Physician Assistant

## 2018-12-05 DIAGNOSIS — E785 Hyperlipidemia, unspecified: Secondary | ICD-10-CM

## 2018-12-05 LAB — COMPREHENSIVE METABOLIC PANEL
ALT: 20 U/L (ref 0–44)
AST: 17 U/L (ref 15–41)
Albumin: 4.3 g/dL (ref 3.5–5.0)
Alkaline Phosphatase: 67 U/L (ref 38–126)
Anion gap: 8 (ref 5–15)
BUN: 21 mg/dL — ABNORMAL HIGH (ref 6–20)
CO2: 29 mmol/L (ref 22–32)
Calcium: 9.4 mg/dL (ref 8.9–10.3)
Chloride: 103 mmol/L (ref 98–111)
Creatinine, Ser: 1.02 mg/dL — ABNORMAL HIGH (ref 0.44–1.00)
GFR calc Af Amer: >60 mL/min (ref >60)
GFR calc non Af Amer: >60 mL/min (ref >60)
Glucose, Bld: 86 mg/dL (ref 70–99)
Potassium: 4 mmol/L (ref 3.5–5.1)
Sodium: 140 mmol/L (ref 135–145)
Total Bilirubin: 0.3 mg/dL (ref 0.3–1.2)
Total Protein: 7.4 g/dL (ref 6.5–8.1)

## 2018-12-05 LAB — LIPID PANEL
Cholesterol: 217 mg/dL — ABNORMAL HIGH (ref 0–200)
HDL: 96 mg/dL (ref >40)
LDL Cholesterol: 102 mg/dL — ABNORMAL HIGH (ref 0–99)
Total CHOL/HDL Ratio: 2.3 RATIO
Triglycerides: 94 mg/dL (ref <150)
VLDL: 19 mg/dL (ref 0–40)

## 2018-12-10 ENCOUNTER — Ambulatory Visit: Payer: Medicaid Other | Admitting: Physician Assistant

## 2018-12-10 ENCOUNTER — Encounter: Payer: Self-pay | Admitting: Physician Assistant

## 2018-12-10 VITALS — BP 143/95 | HR 71 | Temp 97.9°F

## 2018-12-10 DIAGNOSIS — E785 Hyperlipidemia, unspecified: Secondary | ICD-10-CM

## 2018-12-10 DIAGNOSIS — F39 Unspecified mood [affective] disorder: Secondary | ICD-10-CM

## 2018-12-10 DIAGNOSIS — G8929 Other chronic pain: Secondary | ICD-10-CM

## 2018-12-10 DIAGNOSIS — Z1239 Encounter for other screening for malignant neoplasm of breast: Secondary | ICD-10-CM

## 2018-12-10 DIAGNOSIS — K219 Gastro-esophageal reflux disease without esophagitis: Secondary | ICD-10-CM

## 2018-12-10 NOTE — Progress Notes (Signed)
BP (!) 143/95 (BP Location: Right Arm, Patient Position: Sitting, Cuff Size: Normal)   Pulse 71   Temp 97.9 F (36.6 C)   LMP 01/02/2010   SpO2 94%    Subjective:    Patient ID: Julia [name], female    DOB: 09-29-1961, 58 y.o.   MRN: [id number]  HPI: Julia [name] is a 58 y.o. female presenting on 12/10/2018 for Hyperlipidemia   HPI   Pt recently finished prednisone given in ER for cervical radiculopathy.  She has hx fusion.  Pt is trying to get financial assistance at [facility] so she can get in with neurosurgery.    Other than the above, she is doing well.  She is still seeing same mental health specialist.   Relevant past medical, surgical, family and social history reviewed and updated as indicated. Interim medical history since our last visit reviewed. Allergies and medications reviewed and updated.   Current Outpatient Medications:  .  amitriptyline (ELAVIL) 10 MG tablet, Take 10-20 mg by mouth at bedtime., Disp: , Rfl:  .  atorvastatin (LIPITOR) 20 MG tablet, TAKE 1 Tablet BY MOUTH EVERY NIGHT AT BEDTIME, Disp: 90 tablet, Rfl: 0 .  buPROPion (WELLBUTRIN XL) 150 MG 24 hr tablet, Take 300 mg by mouth daily. , Disp: , Rfl:  .  Calcium-Vitamin D-Vitamin K (CALCIUM SOFT CHEWS PO), Take 1 tablet by mouth 2 (two) times daily., Disp: , Rfl:  .  gabapentin (NEURONTIN) 100 MG capsule, Take 100 mg by mouth at bedtime. , Disp: , Rfl:  .  meloxicam (MOBIC) 15 MG tablet, TAKE 1 Tablet BY MOUTH ONCE DAILY AS NEEDED FOR PAIN, Disp: 30 tablet, Rfl: 0 .  omeprazole (PRILOSEC) 20 MG capsule, TAKE 1 Capsule  BY MOUTH TWICE DAILY AS NEEDED, Disp: 180 capsule, Rfl: 0 .  Probiotic Product (PROBIOTIC DAILY PO), Take 1 tablet by mouth daily., Disp: , Rfl:  .  sertraline (ZOLOFT) 100 MG tablet, Take 200 mg by mouth daily. , Disp: , Rfl:  .  amitriptyline (ELAVIL) 25 MG tablet, Take 10-20 mg by mouth at bedtime. , Disp: , Rfl:  .  HYDROcodone-acetaminophen (NORCO/VICODIN) 5-325 MG tablet, Take  1-2 tablets by mouth every 4 (four) hours as needed for moderate pain. (Patient not taking: Reported on 12/10/2018), Disp: 15 tablet, Rfl: 0   Review of Systems  Constitutional: Positive for diaphoresis and fatigue. Negative for appetite change, chills, fever and unexpected weight change.  HENT: Positive for ear pain. Negative for congestion, dental problem, drooling, facial swelling, hearing loss, mouth sores, sneezing, sore throat, trouble swallowing and voice change.   Eyes: Negative for pain, discharge, redness, itching and visual disturbance.  Respiratory: Negative for cough, choking, shortness of breath and wheezing.   Cardiovascular: Negative for chest pain, palpitations and leg swelling.  Gastrointestinal: Positive for abdominal pain and constipation. Negative for blood in stool, diarrhea and vomiting.  Endocrine: Negative for cold intolerance, heat intolerance and polydipsia.  Genitourinary: Negative for decreased urine volume, dysuria and hematuria.  Musculoskeletal: Positive for arthralgias and gait problem. Negative for back pain.  Skin: Negative for rash.  Allergic/Immunologic: Negative for environmental allergies.  Neurological: Positive for headaches. Negative for seizures, syncope and light-headedness.  Hematological: Negative for adenopathy.  Psychiatric/Behavioral: Positive for agitation, dysphoric mood and suicidal ideas. The patient is nervous/anxious.     Per HPI unless specifically indicated above     Objective:    BP (!) 143/95 (BP Location: Right Arm, Patient Position: Sitting, Cuff Size: Normal)  Pulse 71   Temp 97.9 F (36.6 C)   LMP 01/02/2010   SpO2 94%   Wt Readings from Last 3 Encounters:  11/23/18 160 lb (72.6 kg)  06/10/18 168 lb 8 oz (76.4 kg)  11/25/17 165 lb (74.8 kg)    Physical Exam Vitals signs reviewed.  Constitutional:      Appearance: She is well-developed.  HENT:     Head: Normocephalic and atraumatic.  Neck:     Musculoskeletal:  Neck supple.  Cardiovascular:     Rate and Rhythm: Normal rate and regular rhythm.  Pulmonary:     Effort: Pulmonary effort is normal.     Breath sounds: Normal breath sounds.  Abdominal:     General: Bowel sounds are normal.     Palpations: Abdomen is soft. There is no mass.     Tenderness: There is no abdominal tenderness.  Lymphadenopathy:     Cervical: No cervical adenopathy.  Skin:    General: Skin is warm and dry.  Neurological:     Mental Status: She is alert and oriented to person, place, and time.  Psychiatric:        Behavior: Behavior normal.     Results for orders placed or performed during the hospital encounter of 12/05/18  Lipid panel  Result Value Ref Range   Cholesterol 217 (H) 0 - 200 mg/dL   Triglycerides 94 <045 mg/dL   HDL 96 >99 mg/dL   Total CHOL/HDL Ratio 2.3 RATIO   VLDL 19 0 - 40 mg/dL   LDL Cholesterol 774 (H) 0 - 99 mg/dL  Comprehensive metabolic panel  Result Value Ref Range   Sodium 140 135 - 145 mmol/L   Potassium 4.0 3.5 - 5.1 mmol/L   Chloride 103 98 - 111 mmol/L   CO2 29 22 - 32 mmol/L   Glucose, Bld 86 70 - 99 mg/dL   BUN 21 (H) 6 - 20 mg/dL   Creatinine, Ser 1.42 (H) 0.44 - 1.00 mg/dL   Calcium 9.4 8.9 - 39.5 mg/dL   Total Protein 7.4 6.5 - 8.1 g/dL   Albumin 4.3 3.5 - 5.0 g/dL   AST 17 15 - 41 U/L   ALT 20 0 - 44 U/L   Alkaline Phosphatase 67 38 - 126 U/L   Total Bilirubin 0.3 0.3 - 1.2 mg/dL   GFR calc non Af Amer >60 >60 mL/min   GFR calc Af Amer >60 >60 mL/min   Anion gap 8 5 - 15      Assessment & Plan:   Encounter Diagnoses  Name Primary?  . Hyperlipidemia, unspecified hyperlipidemia type Yes  . Gastroesophageal reflux disease, esophagitis presence not specified   . Mood disorder (HCC)   . Other chronic pain   . Screening for breast cancer     -reviewed labs with pt -will order screening mammogram to be done after 4/11 -pt to Continue current medications -pt to follow up 6 months.  RTO sooner prn

## 2018-12-10 NOTE — Patient Instructions (Signed)
[facility] Financial Assistance [phone]

## 2018-12-16 ENCOUNTER — Other Ambulatory Visit: Payer: Self-pay | Admitting: Physician Assistant

## 2018-12-16 DIAGNOSIS — Z1239 Encounter for other screening for malignant neoplasm of breast: Secondary | ICD-10-CM

## 2019-01-12 ENCOUNTER — Other Ambulatory Visit: Payer: Self-pay | Admitting: Physician Assistant

## 2019-02-10 ENCOUNTER — Other Ambulatory Visit: Payer: Self-pay | Admitting: Physician Assistant

## 2019-03-12 ENCOUNTER — Ambulatory Visit (HOSPITAL_COMMUNITY)
Admission: RE | Admit: 2019-03-12 | Discharge: 2019-03-12 | Disposition: A | Discharge status: Home / Self Care | Payer: Self-pay | Source: Ambulatory Visit | Attending: Physician Assistant | Admitting: Physician Assistant

## 2019-03-12 ENCOUNTER — Other Ambulatory Visit: Payer: Self-pay

## 2019-03-12 DIAGNOSIS — Z1239 Encounter for other screening for malignant neoplasm of breast: Secondary | ICD-10-CM | POA: Insufficient documentation

## 2019-03-24 ENCOUNTER — Telehealth: Payer: Self-pay | Admitting: Physician Assistant

## 2019-03-24 NOTE — Telephone Encounter (Signed)
Faxed over pt. [employer] application to [employer]. Also with her [id number],ID, Letter of Support and [place]. Pt. Med. Home will be the [place]

## 2019-05-11 ENCOUNTER — Other Ambulatory Visit: Payer: Self-pay | Admitting: Physician Assistant

## 2019-05-26 ENCOUNTER — Other Ambulatory Visit: Payer: Self-pay | Admitting: Physician Assistant

## 2019-06-02 ENCOUNTER — Other Ambulatory Visit: Payer: Self-pay | Admitting: Physician Assistant

## 2019-06-02 MED ORDER — MELOXICAM 15 MG PO TABS: ORAL_TABLET | ORAL | 3 refills | Status: DC

## 2019-06-10 ENCOUNTER — Ambulatory Visit: Payer: Medicaid Other | Admitting: Physician Assistant

## 2019-06-24 ENCOUNTER — Other Ambulatory Visit: Payer: Self-pay

## 2019-06-25 ENCOUNTER — Encounter: Payer: Self-pay | Admitting: Family Medicine

## 2019-06-25 ENCOUNTER — Ambulatory Visit (INDEPENDENT_AMBULATORY_CARE_PROVIDER_SITE_OTHER): Payer: Medicare HMO | Admitting: Family Medicine

## 2019-06-25 VITALS — BP 129/89 | HR 76 | Temp 97.7°F | Ht 61.0 in | Wt 171.8 lb

## 2019-06-25 DIAGNOSIS — M1990 Unspecified osteoarthritis, unspecified site: Secondary | ICD-10-CM

## 2019-06-25 DIAGNOSIS — G8929 Other chronic pain: Secondary | ICD-10-CM

## 2019-06-25 DIAGNOSIS — Z23 Encounter for immunization: Secondary | ICD-10-CM

## 2019-06-25 DIAGNOSIS — K219 Gastro-esophageal reflux disease without esophagitis: Secondary | ICD-10-CM | POA: Diagnosis not present

## 2019-06-25 DIAGNOSIS — F32A Depression, unspecified: Secondary | ICD-10-CM

## 2019-06-25 DIAGNOSIS — K589 Irritable bowel syndrome without diarrhea: Secondary | ICD-10-CM | POA: Diagnosis not present

## 2019-06-25 DIAGNOSIS — M889 Osteitis deformans of unspecified bone: Secondary | ICD-10-CM

## 2019-06-25 DIAGNOSIS — Z1159 Encounter for screening for other viral diseases: Secondary | ICD-10-CM | POA: Diagnosis not present

## 2019-06-25 DIAGNOSIS — M79672 Pain in left foot: Secondary | ICD-10-CM

## 2019-06-25 DIAGNOSIS — M5412 Radiculopathy, cervical region: Secondary | ICD-10-CM

## 2019-06-25 DIAGNOSIS — E785 Hyperlipidemia, unspecified: Secondary | ICD-10-CM

## 2019-06-25 DIAGNOSIS — R69 Illness, unspecified: Secondary | ICD-10-CM | POA: Diagnosis not present

## 2019-06-25 DIAGNOSIS — F329 Major depressive disorder, single episode, unspecified: Secondary | ICD-10-CM

## 2019-06-25 DIAGNOSIS — M79671 Pain in right foot: Secondary | ICD-10-CM | POA: Diagnosis not present

## 2019-06-25 DIAGNOSIS — F411 Generalized anxiety disorder: Secondary | ICD-10-CM

## 2019-06-25 MED ORDER — OMEPRAZOLE 20 MG PO CPDR: 20.0000 mg | DELAYED_RELEASE_CAPSULE | Freq: Two times a day (BID) | ORAL | 1 refills | Status: DC | PRN

## 2019-06-25 MED ORDER — SHINGRIX 50 MCG/0.5ML IM SUSR: 0.5000 mL | Freq: Once | INTRAMUSCULAR | 0 refills | Status: AC

## 2019-06-25 MED ORDER — ATORVASTATIN CALCIUM 20 MG PO TABS: 20.0000 mg | ORAL_TABLET | Freq: Every evening | ORAL | 1 refills | Status: DC

## 2019-06-25 MED ORDER — GABAPENTIN 100 MG PO CAPS: 100.0000 mg | ORAL_CAPSULE | Freq: Every day | ORAL | 1 refills | Status: DC

## 2019-06-25 MED ORDER — MELOXICAM 15 MG PO TABS: 15.0000 mg | ORAL_TABLET | Freq: Every day | ORAL | 1 refills | Status: DC | PRN

## 2019-06-25 NOTE — Progress Notes (Signed)
New Patient Office Visit  Assessment & Plan:  1. Gastroesophageal reflux disease, esophagitis presence not specified - Patient to continue omeprazole 20 mg PO BID.  - Ambulatory referral to Gastroenterology - omeprazole (PRILOSEC) 20 MG capsule; Take 1 capsule (20 mg total) by mouth 2 (two) times daily as needed.  Dispense: 180 capsule; Refill: 1 - CMP14+EGFR  2. Irritable bowel syndrome, unspecified type - Continue probiotic daily.  - Ambulatory referral to Gastroenterology  3. Paget disease of bone - Ambulatory referral to Rheumatology - CBC with Differential/Platelet - CMP14+EGFR  4. Cervical radiculopathy - Continue meloxicam daily as needed.  - Ambulatory referral to Neurosurgery  5. Arthritis - Continue meloxicam daily as needed.  - meloxicam (MOBIC) 15 MG tablet; Take 1 tablet (15 mg total) by mouth daily as needed for pain. TAKE 1 Tablet BY MOUTH ONCE DAILY AS NEEDED FOR PAIN  Dispense: 90 tablet; Refill: 1 - CMP14+EGFR  6. Hyperlipidemia, unspecified hyperlipidemia type - Well controlled on current regimen.  - atorvastatin (LIPITOR) 20 MG tablet; Take 1 tablet (20 mg total) by mouth every evening.  Dispense: 90 tablet; Refill: 1 - CMP14+EGFR - Lipid panel  7. Other chronic pain - Continue current regimen.  - gabapentin (NEURONTIN) 100 MG capsule; Take 1 capsule (100 mg total) by mouth at bedtime.  Dispense: 90 capsule; Refill: 1  8. Heel pain, bilateral - Continue to apply Lidocaine gel as needed. Encouraged to wear good, supportive, comfortable shoes.   9. Depression, unspecified depression type - Managed by [facility] in [place].   10. GAD (generalized anxiety disorder) - Managed by [facility] in [place].  11. Encounter for hepatitis C screening test for low risk patient - Hepatitis C antibody  12. Immunization due - SHINGRIX injection; Inject 0.5 mLs into the muscle once for 1 dose.  Dispense: 0.5 mL; Refill: 0  13. Need  for immunization against influenza - Flu Vaccine QUAD 36+ mos IM   Follow-up: Return in about 6 months (around 12/23/2019) for annual physical.   Hendricks Limes, MSN, APRN, FNP-C [facility]  Subjective:  Patient ID: Julia [name], female    DOB: April 30, 1961  Age: 58 y.o. MRN: [id number]  Patient Care Team: Loman Brooklyn, FNP as PCP - General (Family Medicine) Fransico Michael (Psychiatry)  CC:  Chief Complaint  Patient presents with   New Patient (Initial Visit)    Just got insurance and was going to the free clinic in [place]     HPI Julia [name] presents to establish care. Patient transferring from the free clinic in [place]. Patient also is in need of multiple referrals.   She is in need of a referral to a rheumatologist due to Paget's disease and osteoarthritis. She was going to Dr. Amil Amen, but lost her insurance. She has also seen a rheumatologist in [place] that she was approved to see but then was not able to return. She would like to resume seeing Dr. Amil Amen.   She reports she needs a neurosurgeon to follow-up on her neck pain and cervical radiculopathy. She ended up going to the ER in February of this year as the pain got so bad. She reports her right arm/hand has a painful falling asleep sensation. Her fingers go numb - sometimes it is the thumb, index and middle fingers, other times it is the ring and pinky fingers. She applies ice to the arm to help with the pain. Her neck hurts on the left side. She describes this pain  as feeling like she has a tens unit on her neck. Also says it feels like "real real bad pins and needles" and "like the neck is going to explode". She can only sleep on her left side due to the pain. She previously had surgery on her neck with Dr. Arnoldo Morale but does not wish to return to him. She would like a new neurosurgeon and has specifically requested the office in the [place] tower at [place] on the 4th floor.    Patient reports she needs a gastroenterologist as she has GERD and was told by her previous PCP she needs to have an EGD. She has seen [facility] for a colonoscopy and would like to return to their office.   Patient goes to [facility] in [place] where she is prescribed medications and does her therapy.  Today she is concerned that both of her heels hurt when she initially stands up. The pain does resolve but when she first stands she states it hurts so bad she would rather crawl. It is helpful to apply Lidocaine gel. This has been ongoing for the past six months. She does admit that most of the time she is barefoot.    Review of Systems  Constitutional: Negative for chills, fever, malaise/fatigue and weight loss.  HENT: Negative for congestion, ear discharge, ear pain, nosebleeds, sinus pain, sore throat and tinnitus.   Eyes: Negative for blurred vision, double vision, pain, discharge and redness.  Respiratory: Negative for cough, shortness of breath and wheezing.   Cardiovascular: Negative for chest pain, palpitations and leg swelling.  Gastrointestinal: Positive for heartburn. Negative for abdominal pain, constipation, diarrhea, nausea and vomiting.  Genitourinary: Negative for dysuria, frequency and urgency.  Musculoskeletal: Positive for falls (patient fell out of bed once and tripped over the dog another time), joint pain and neck pain. Negative for myalgias.  Skin: Negative for rash.  Neurological: Negative for dizziness, seizures, weakness and headaches.  Psychiatric/Behavioral: Positive for depression. Negative for substance abuse and suicidal ideas. The patient is nervous/anxious.     Current Outpatient Medications:    amitriptyline (ELAVIL) 10 MG tablet, Take 10 mg by mouth at bedtime. , Disp: , Rfl:    atorvastatin (LIPITOR) 20 MG tablet, Take 1 tablet (20 mg total) by mouth every evening., Disp: 90 tablet, Rfl: 1   buPROPion (WELLBUTRIN XL) 150 MG  24 hr tablet, Take 300 mg by mouth daily. , Disp: , Rfl:    gabapentin (NEURONTIN) 100 MG capsule, Take 1 capsule (100 mg total) by mouth at bedtime., Disp: 90 capsule, Rfl: 1   meloxicam (MOBIC) 15 MG tablet, Take 1 tablet (15 mg total) by mouth daily as needed for pain. TAKE 1 Tablet BY MOUTH ONCE DAILY AS NEEDED FOR PAIN, Disp: 90 tablet, Rfl: 1   omeprazole (PRILOSEC) 20 MG capsule, Take 1 capsule (20 mg total) by mouth 2 (two) times daily as needed., Disp: 180 capsule, Rfl: 1   Probiotic Product (PROBIOTIC DAILY PO), Take 1 tablet by mouth daily., Disp: , Rfl:    sertraline (ZOLOFT) 100 MG tablet, Take 200 mg by mouth daily. , Disp: , Rfl:    SHINGRIX injection, Inject 0.5 mLs into the muscle once for 1 dose., Disp: 0.5 mL, Rfl: 0  Allergies  Allergen Reactions   Lamictal [Lamotrigine] Itching and Swelling    Past Medical History:  Diagnosis Date   Anxiety    Arthritis    Complication of anesthesia    slow to wake up-does not take  much medicine to sedate her   Depression    Esophageal reflux 08/15/2015   Hyperlipidemia 08/15/2015   Paget disease of bone    Wears glasses     Past Surgical History:  Procedure Laterality Date   ABDOMINAL HYSTERECTOMY  8/11   TAH   CERVICAL SPINE SURGERY  1/13   DILATION AND CURETTAGE OF UTERUS  1986   KNEE ARTHROSCOPY Left 07/22/2013   Procedure: LEFT KNEE ARTHROSCOPY WITH DEBRIDEMENT CHONDROPLASTY, REMOVAL LOOSE BODIES, PARTIAL MEDIAL MENISCECTOMY, OPEN EXCISION OF PES GANGLION;  Surgeon: Kerin Salen, MD;  Location: [facility];  Service: Orthopedics;  Laterality: Left;  NO SPECIMEN SENT PER DR. Mayer Camel ORDER.   TUBAL LIGATION  1989    Family History  Problem Relation Age of Onset   Thyroid disease Mother    Congestive Heart Failure Mother    Heart disease Mother    Lung cancer Sister    Melanoma Sister    Healthy Brother    Healthy Brother    Healthy Brother    Healthy Brother    Heart  disease Father     Social History   Socioeconomic History   Marital status: Divorced    Spouse name: Not on file   Number of children: Not on file   Years of education: Not on file   Highest education level: Not on file  Occupational History   Not on file  Social Needs   Financial resource strain: Not on file   Food insecurity    Worry: Not on file    Inability: Not on file   Transportation needs    Medical: Not on file    Non-medical: Not on file  Tobacco Use   Smoking status: Former Smoker    Quit date: 01/02/1985    Years since quitting: 34.4   Smokeless tobacco: Never Used  Substance and Sexual Activity   Alcohol use: Yes    Comment: occassional   Drug use: No   Sexual activity: Not on file  Lifestyle   Physical activity    Days per week: Not on file    Minutes per session: Not on file   Stress: Not on file  Relationships   Social connections    Talks on phone: Not on file    Gets together: Not on file    Attends religious service: Not on file    Active member of club or organization: Not on file    Attends meetings of clubs or organizations: Not on file    Relationship status: Not on file   Intimate partner violence    Fear of current or ex partner: Not on file    Emotionally abused: Not on file    Physically abused: Not on file    Forced sexual activity: Not on file  Other Topics Concern   Not on file  Social History Narrative   Not on file    Objective:   Today's Vitals: BP 129/89    Pulse 76    Temp 97.7 F (36.5 C) (Temporal)    Ht 5' 1"  (1.549 m)    Wt 171 lb 12.8 oz (77.9 kg)    LMP 01/02/2010    SpO2 97%    BMI 32.46 kg/m   Physical Exam Vitals signs reviewed.  Constitutional:      General: She is not in acute distress.    Appearance: Normal appearance. She is obese. She is not ill-appearing, toxic-appearing or diaphoretic.  HENT:  Head: Normocephalic and atraumatic.  Eyes:     General: No scleral icterus.        Right eye: No discharge.        Left eye: No discharge.     Conjunctiva/sclera: Conjunctivae normal.  Neck:     Musculoskeletal: Normal range of motion.  Cardiovascular:     Rate and Rhythm: Normal rate and regular rhythm.     Heart sounds: Normal heart sounds. No murmur. No friction rub. No gallop.   Pulmonary:     Effort: Pulmonary effort is normal. No respiratory distress.     Breath sounds: Normal breath sounds. No stridor. No wheezing, rhonchi or rales.  Musculoskeletal: Normal range of motion.     Comments: Unable to elicit pain in heels with palpation.   Skin:    General: Skin is warm and dry.     Capillary Refill: Capillary refill takes less than 2 seconds.  Neurological:     General: No focal deficit present.     Mental Status: She is alert and oriented to person, place, and time. Mental status is at baseline.  Psychiatric:        Mood and Affect: Mood normal.        Behavior: Behavior normal.        Thought Content: Thought content normal.        Judgment: Judgment normal.

## 2019-06-26 LAB — CMP14+EGFR
ALT: 13 IU/L (ref 0–32)
AST: 19 IU/L (ref 0–40)
Albumin/Globulin Ratio: 1.9 (ref 1.2–2.2)
Albumin: 4.7 g/dL (ref 3.8–4.9)
Alkaline Phosphatase: 97 IU/L (ref 39–117)
BUN/Creatinine Ratio: 16 (ref 9–23)
BUN: 18 mg/dL (ref 6–24)
Bilirubin Total: 0.5 mg/dL (ref 0.0–1.2)
CO2: 24 mmol/L (ref 20–29)
Calcium: 10.1 mg/dL (ref 8.7–10.2)
Chloride: 103 mmol/L (ref 96–106)
Creatinine, Ser: 1.16 mg/dL — ABNORMAL HIGH (ref 0.57–1.00)
GFR calc Af Amer: 60 mL/min/{1.73_m2} (ref >59)
GFR calc non Af Amer: 52 mL/min/{1.73_m2} — ABNORMAL LOW (ref >59)
Globulin, Total: 2.5 g/dL (ref 1.5–4.5)
Glucose: 105 mg/dL — ABNORMAL HIGH (ref 65–99)
Potassium: 4.9 mmol/L (ref 3.5–5.2)
Sodium: 141 mmol/L (ref 134–144)
Total Protein: 7.2 g/dL (ref 6.0–8.5)

## 2019-06-26 LAB — CBC WITH DIFFERENTIAL/PLATELET
Basophils Absolute: 0 10*3/uL (ref 0.0–0.2)
Basos: 0 %
EOS (ABSOLUTE): 0.1 10*3/uL (ref 0.0–0.4)
Eos: 2 %
Hematocrit: 41.9 % (ref 34.0–46.6)
Hemoglobin: 13.8 g/dL (ref 11.1–15.9)
Immature Grans (Abs): 0 10*3/uL (ref 0.0–0.1)
Immature Granulocytes: 0 %
Lymphocytes Absolute: 1 10*3/uL (ref 0.7–3.1)
Lymphs: 18 %
MCH: 30.9 pg (ref 26.6–33.0)
MCHC: 32.9 g/dL (ref 31.5–35.7)
MCV: 94 fL (ref 79–97)
Monocytes Absolute: 0.4 10*3/uL (ref 0.1–0.9)
Monocytes: 7 %
Neutrophils Absolute: 4.1 10*3/uL (ref 1.4–7.0)
Neutrophils: 73 %
Platelets: 260 10*3/uL (ref 150–450)
RBC: 4.47 x10E6/uL (ref 3.77–5.28)
RDW: 13.3 % (ref 11.7–15.4)
WBC: 5.7 10*3/uL (ref 3.4–10.8)

## 2019-06-26 LAB — LIPID PANEL
Chol/HDL Ratio: 2.9 ratio (ref 0.0–4.4)
Cholesterol, Total: 207 mg/dL — ABNORMAL HIGH (ref 100–199)
HDL: 71 mg/dL (ref >39)
LDL Chol Calc (NIH): 118 mg/dL — ABNORMAL HIGH (ref 0–99)
Triglycerides: 104 mg/dL (ref 0–149)
VLDL Cholesterol Cal: 18 mg/dL (ref 5–40)

## 2019-06-26 LAB — HEPATITIS C ANTIBODY: Hep C Virus Ab: <0.1 s/co ratio (ref 0.0–0.9)

## 2019-07-07 DIAGNOSIS — M5412 Radiculopathy, cervical region: Secondary | ICD-10-CM | POA: Diagnosis not present

## 2019-07-07 DIAGNOSIS — M50321 Other cervical disc degeneration at C4-C5 level: Secondary | ICD-10-CM | POA: Diagnosis not present

## 2019-07-07 DIAGNOSIS — M50121 Cervical disc disorder at C4-C5 level with radiculopathy: Secondary | ICD-10-CM | POA: Diagnosis not present

## 2019-07-08 DIAGNOSIS — F4310 Post-traumatic stress disorder, unspecified: Secondary | ICD-10-CM | POA: Diagnosis not present

## 2019-07-08 DIAGNOSIS — R69 Illness, unspecified: Secondary | ICD-10-CM | POA: Diagnosis not present

## 2019-08-04 DIAGNOSIS — M50121 Cervical disc disorder at C4-C5 level with radiculopathy: Secondary | ICD-10-CM | POA: Diagnosis not present

## 2019-08-04 DIAGNOSIS — F332 Major depressive disorder, recurrent severe without psychotic features: Secondary | ICD-10-CM | POA: Diagnosis not present

## 2019-08-04 DIAGNOSIS — G959 Disease of spinal cord, unspecified: Secondary | ICD-10-CM | POA: Diagnosis not present

## 2019-08-04 DIAGNOSIS — M5412 Radiculopathy, cervical region: Secondary | ICD-10-CM | POA: Diagnosis not present

## 2019-08-04 DIAGNOSIS — R69 Illness, unspecified: Secondary | ICD-10-CM | POA: Diagnosis not present

## 2019-08-05 DIAGNOSIS — Z8371 Family history of colonic polyps: Secondary | ICD-10-CM | POA: Diagnosis not present

## 2019-08-05 DIAGNOSIS — R1012 Left upper quadrant pain: Secondary | ICD-10-CM | POA: Diagnosis not present

## 2019-08-05 DIAGNOSIS — K5901 Slow transit constipation: Secondary | ICD-10-CM | POA: Diagnosis not present

## 2019-08-05 DIAGNOSIS — R1311 Dysphagia, oral phase: Secondary | ICD-10-CM | POA: Diagnosis not present

## 2019-08-10 DIAGNOSIS — R69 Illness, unspecified: Secondary | ICD-10-CM | POA: Diagnosis not present

## 2019-08-19 DIAGNOSIS — M5030 Other cervical disc degeneration, unspecified cervical region: Secondary | ICD-10-CM | POA: Diagnosis not present

## 2019-08-19 DIAGNOSIS — E669 Obesity, unspecified: Secondary | ICD-10-CM | POA: Diagnosis not present

## 2019-08-19 DIAGNOSIS — Z13820 Encounter for screening for osteoporosis: Secondary | ICD-10-CM | POA: Diagnosis not present

## 2019-08-19 DIAGNOSIS — M2550 Pain in unspecified joint: Secondary | ICD-10-CM | POA: Diagnosis not present

## 2019-08-19 DIAGNOSIS — M889 Osteitis deformans of unspecified bone: Secondary | ICD-10-CM | POA: Diagnosis not present

## 2019-08-19 DIAGNOSIS — Z6832 Body mass index (BMI) 32.0-32.9, adult: Secondary | ICD-10-CM | POA: Diagnosis not present

## 2019-08-19 DIAGNOSIS — M150 Primary generalized (osteo)arthritis: Secondary | ICD-10-CM | POA: Diagnosis not present

## 2019-08-21 ENCOUNTER — Other Ambulatory Visit: Payer: Self-pay | Admitting: Internal Medicine

## 2019-08-21 ENCOUNTER — Other Ambulatory Visit (HOSPITAL_COMMUNITY): Payer: Self-pay | Admitting: Internal Medicine

## 2019-08-21 DIAGNOSIS — M889 Osteitis deformans of unspecified bone: Secondary | ICD-10-CM

## 2019-09-03 ENCOUNTER — Encounter (HOSPITAL_COMMUNITY)
Admission: RE | Admit: 2019-09-03 | Discharge: 2019-09-03 | Disposition: A | Discharge status: Home / Self Care | Payer: Medicare HMO | Source: Ambulatory Visit | Attending: Internal Medicine | Admitting: Internal Medicine

## 2019-09-03 ENCOUNTER — Other Ambulatory Visit: Payer: Self-pay

## 2019-09-03 DIAGNOSIS — R948 Abnormal results of function studies of other organs and systems: Secondary | ICD-10-CM | POA: Diagnosis not present

## 2019-09-03 DIAGNOSIS — M889 Osteitis deformans of unspecified bone: Secondary | ICD-10-CM

## 2019-09-03 MED ORDER — TECHNETIUM TC 99M MEDRONATE IV KIT
20.0000 | PACK | Freq: Once | INTRAVENOUS | Status: AC | PRN
  Administered 2019-09-03: 20 via INTRAVENOUS

## 2019-09-04 DIAGNOSIS — Z1159 Encounter for screening for other viral diseases: Secondary | ICD-10-CM | POA: Diagnosis not present

## 2019-09-07 DIAGNOSIS — H52209 Unspecified astigmatism, unspecified eye: Secondary | ICD-10-CM | POA: Diagnosis not present

## 2019-09-07 DIAGNOSIS — H524 Presbyopia: Secondary | ICD-10-CM | POA: Diagnosis not present

## 2019-09-07 DIAGNOSIS — H5213 Myopia, bilateral: Secondary | ICD-10-CM | POA: Diagnosis not present

## 2019-09-07 DIAGNOSIS — H269 Unspecified cataract: Secondary | ICD-10-CM | POA: Diagnosis not present

## 2019-09-07 DIAGNOSIS — H5203 Hypermetropia, bilateral: Secondary | ICD-10-CM | POA: Diagnosis not present

## 2019-09-09 DIAGNOSIS — K449 Diaphragmatic hernia without obstruction or gangrene: Secondary | ICD-10-CM | POA: Diagnosis not present

## 2019-09-09 DIAGNOSIS — R1310 Dysphagia, unspecified: Secondary | ICD-10-CM | POA: Diagnosis not present

## 2019-09-09 DIAGNOSIS — Z8371 Family history of colonic polyps: Secondary | ICD-10-CM | POA: Diagnosis not present

## 2019-09-14 DIAGNOSIS — R69 Illness, unspecified: Secondary | ICD-10-CM | POA: Diagnosis not present

## 2019-09-29 DIAGNOSIS — F4310 Post-traumatic stress disorder, unspecified: Secondary | ICD-10-CM | POA: Diagnosis not present

## 2019-09-29 DIAGNOSIS — R69 Illness, unspecified: Secondary | ICD-10-CM | POA: Diagnosis not present

## 2019-10-02 HISTORY — PX: ANTERIOR FUSION CERVICAL SPINE: SUR626

## 2019-10-16 DIAGNOSIS — R69 Illness, unspecified: Secondary | ICD-10-CM | POA: Diagnosis not present

## 2019-10-20 DIAGNOSIS — M889 Osteitis deformans of unspecified bone: Secondary | ICD-10-CM | POA: Diagnosis not present

## 2019-10-20 DIAGNOSIS — E785 Hyperlipidemia, unspecified: Secondary | ICD-10-CM | POA: Diagnosis not present

## 2019-10-20 DIAGNOSIS — K219 Gastro-esophageal reflux disease without esophagitis: Secondary | ICD-10-CM | POA: Diagnosis not present

## 2019-10-20 DIAGNOSIS — G9589 Other specified diseases of spinal cord: Secondary | ICD-10-CM | POA: Diagnosis not present

## 2019-10-20 DIAGNOSIS — M4802 Spinal stenosis, cervical region: Secondary | ICD-10-CM | POA: Diagnosis not present

## 2019-10-20 DIAGNOSIS — M5412 Radiculopathy, cervical region: Secondary | ICD-10-CM | POA: Diagnosis not present

## 2019-10-20 DIAGNOSIS — G992 Myelopathy in diseases classified elsewhere: Secondary | ICD-10-CM | POA: Diagnosis not present

## 2019-10-20 DIAGNOSIS — R1310 Dysphagia, unspecified: Secondary | ICD-10-CM | POA: Diagnosis not present

## 2019-10-20 DIAGNOSIS — Z01812 Encounter for preprocedural laboratory examination: Secondary | ICD-10-CM | POA: Diagnosis not present

## 2019-10-20 DIAGNOSIS — M50121 Cervical disc disorder at C4-C5 level with radiculopathy: Secondary | ICD-10-CM | POA: Diagnosis not present

## 2019-10-20 DIAGNOSIS — Z20822 Contact with and (suspected) exposure to covid-19: Secondary | ICD-10-CM | POA: Diagnosis not present

## 2019-10-20 DIAGNOSIS — R69 Illness, unspecified: Secondary | ICD-10-CM | POA: Diagnosis not present

## 2019-10-26 DIAGNOSIS — R1310 Dysphagia, unspecified: Secondary | ICD-10-CM | POA: Diagnosis not present

## 2019-10-26 DIAGNOSIS — M5001 Cervical disc disorder with myelopathy,  high cervical region: Secondary | ICD-10-CM | POA: Diagnosis not present

## 2019-10-26 DIAGNOSIS — M889 Osteitis deformans of unspecified bone: Secondary | ICD-10-CM | POA: Diagnosis not present

## 2019-10-26 DIAGNOSIS — M50121 Cervical disc disorder at C4-C5 level with radiculopathy: Secondary | ICD-10-CM | POA: Diagnosis not present

## 2019-10-26 DIAGNOSIS — R69 Illness, unspecified: Secondary | ICD-10-CM | POA: Diagnosis not present

## 2019-10-26 DIAGNOSIS — G992 Myelopathy in diseases classified elsewhere: Secondary | ICD-10-CM | POA: Diagnosis not present

## 2019-10-26 DIAGNOSIS — M4802 Spinal stenosis, cervical region: Secondary | ICD-10-CM | POA: Diagnosis not present

## 2019-10-26 DIAGNOSIS — M50021 Cervical disc disorder at C4-C5 level with myelopathy: Secondary | ICD-10-CM | POA: Diagnosis not present

## 2019-10-26 DIAGNOSIS — E785 Hyperlipidemia, unspecified: Secondary | ICD-10-CM | POA: Diagnosis not present

## 2019-10-26 DIAGNOSIS — K219 Gastro-esophageal reflux disease without esophagitis: Secondary | ICD-10-CM | POA: Diagnosis not present

## 2019-10-26 DIAGNOSIS — Z472 Encounter for removal of internal fixation device: Secondary | ICD-10-CM | POA: Diagnosis not present

## 2019-10-26 DIAGNOSIS — M5011 Cervical disc disorder with radiculopathy,  high cervical region: Secondary | ICD-10-CM | POA: Diagnosis not present

## 2019-10-26 DIAGNOSIS — Z981 Arthrodesis status: Secondary | ICD-10-CM | POA: Diagnosis not present

## 2019-10-30 MED ORDER — BUPROPION HCL ER (XL) 300 MG PO TB24
300.00 | ORAL_TABLET | ORAL | Status: DC
Start: ? — End: 2019-10-30

## 2019-10-30 MED ORDER — OXYCODONE HCL 5 MG PO TABS
5.00 | ORAL_TABLET | ORAL | Status: DC
Start: ? — End: 2019-10-30

## 2019-10-30 MED ORDER — DOCUSATE SODIUM 283 MG RE ENEM
283.00 | ENEMA | RECTAL | Status: DC
Start: ? — End: 2019-10-30

## 2019-10-30 MED ORDER — BENZOCAINE-MENTHOL 6-10 MG MT LOZG
1.00 | LOZENGE | OROMUCOSAL | Status: DC
Start: ? — End: 2019-10-30

## 2019-10-30 MED ORDER — TRAZODONE HCL 50 MG PO TABS
50.00 | ORAL_TABLET | ORAL | Status: DC
Start: ? — End: 2019-10-30

## 2019-10-30 MED ORDER — ATORVASTATIN CALCIUM 10 MG PO TABS
20.00 | ORAL_TABLET | ORAL | Status: DC
Start: 2019-10-30 — End: 2019-10-30

## 2019-10-30 MED ORDER — ONDANSETRON HCL 4 MG/2ML IJ SOLN
4.00 | INTRAMUSCULAR | Status: DC
Start: ? — End: 2019-10-30

## 2019-10-30 MED ORDER — POLYETHYLENE GLYCOL 3350 17 GM/SCOOP PO POWD
17.00 | ORAL | Status: DC
Start: 2019-10-30 — End: 2019-10-30

## 2019-10-30 MED ORDER — SERTRALINE HCL 50 MG PO TABS
200.00 | ORAL_TABLET | ORAL | Status: DC
Start: 2019-10-30 — End: 2019-10-30

## 2019-10-30 MED ORDER — PHENOL 1.4 % MT LIQD
1.00 | OROMUCOSAL | Status: DC
Start: ? — End: 2019-10-30

## 2019-10-30 MED ORDER — HEPARIN SODIUM (PORCINE) 5000 UNIT/ML IJ SOLN
5000.00 | INTRAMUSCULAR | Status: DC
Start: 2019-10-29 — End: 2019-10-30

## 2019-10-30 MED ORDER — ACETAMINOPHEN 500 MG PO TABS
500.00 | ORAL_TABLET | ORAL | Status: DC
Start: 2019-10-29 — End: 2019-10-30

## 2019-10-30 MED ORDER — PANTOPRAZOLE SODIUM 40 MG PO TBEC
40.00 | DELAYED_RELEASE_TABLET | ORAL | Status: DC
Start: 2019-10-30 — End: 2019-10-30

## 2019-10-30 MED ORDER — GABAPENTIN 100 MG PO CAPS
100.00 | ORAL_CAPSULE | ORAL | Status: DC
Start: 2019-10-29 — End: 2019-10-30

## 2019-10-30 MED ORDER — BISACODYL 10 MG RE SUPP
10.00 | RECTAL | Status: DC
Start: ? — End: 2019-10-30

## 2019-10-30 MED ORDER — BUPROPION HCL ER (SR) 150 MG PO TB12
150.00 | ORAL_TABLET | ORAL | Status: DC
Start: 2019-10-29 — End: 2019-10-30

## 2019-10-30 MED ORDER — METHOCARBAMOL 500 MG PO TABS
500.00 | ORAL_TABLET | ORAL | Status: DC
Start: 2019-10-29 — End: 2019-10-30

## 2019-10-30 MED ORDER — SENNOSIDES-DOCUSATE SODIUM 8.6-50 MG PO TABS
2.00 | ORAL_TABLET | ORAL | Status: DC
Start: 2019-10-29 — End: 2019-10-30

## 2019-11-27 DIAGNOSIS — Z981 Arthrodesis status: Secondary | ICD-10-CM | POA: Diagnosis not present

## 2019-11-27 DIAGNOSIS — Z4789 Encounter for other orthopedic aftercare: Secondary | ICD-10-CM | POA: Diagnosis not present

## 2019-12-20 NOTE — Progress Notes (Deleted)
Assessment & Plan:  ***  Follow-up: No follow-ups on file.   Hendricks Limes, MSN, APRN, FNP-C [facility]  Subjective:  Patient ID: Julia [name], female    DOB: Feb 11, 1961  Age: 59 y.o. MRN: [id number]  Patient Care Team: Loman Brooklyn, FNP as PCP - General (Family Medicine) Fransico Michael (Psychiatry)   CC: No chief complaint on file.   HPI Julia [name] presents for her annual physical.   Occupation: ***, Marital status: ***, Substance use: *** Diet: ***, Exercise: *** Last eye exam: *** Last dental exam: *** Last colonoscopy: 09/09/2019 Last mammogram: 03/12/2019 Last pap smear: s/p hysterectomy Hepatitis C Screening: completed on 06/25/2019 Immunizations: Flu Vaccine: up to date Tdap Vaccine: up to date  Shingrix Vaccine: {BFJ Vaccine Multiples:23741}   DEPRESSION SCREENING Depression screen [facility] 2/9 06/25/2019 11/25/2013 11/11/2013  Decreased Interest 3 2 2   Down, Depressed, Hopeless 2 2 2   PHQ - 2 Score 5 4 4   Altered sleeping 3 1 2   Tired, decreased energy 3 1 2   Change in appetite 3 1 3   Feeling bad or failure about yourself  2 2 1   Trouble concentrating 3 1 1   Moving slowly or fidgety/restless 2 1 1   Suicidal thoughts 0 1 0  PHQ-9 Score 21 12 14    GAD 7 : Generalized Anxiety Score 06/25/2019  Nervous, Anxious, on Edge 3  Control/stop worrying 3  Worry too much - different things 3  Trouble relaxing 3  Restless 2  Easily annoyed or irritable 3  Afraid - awful might happen 2  Total GAD 7 Score 19    ROS   Current Outpatient Medications:  .  amitriptyline (ELAVIL) 10 MG tablet, Take 10 mg by mouth at bedtime. , Disp: , Rfl:  .  atorvastatin (LIPITOR) 20 MG tablet, Take 1 tablet (20 mg total) by mouth every evening., Disp: 90 tablet, Rfl: 1 .  buPROPion (WELLBUTRIN XL) 150 MG 24 hr tablet, Take 300 mg by mouth daily. , Disp: , Rfl:  .  gabapentin (NEURONTIN) 100 MG capsule, Take 1 capsule (100 mg total) by mouth at  bedtime., Disp: 90 capsule, Rfl: 1 .  meloxicam (MOBIC) 15 MG tablet, Take 1 tablet (15 mg total) by mouth daily as needed for pain. TAKE 1 Tablet BY MOUTH ONCE DAILY AS NEEDED FOR PAIN, Disp: 90 tablet, Rfl: 1 .  omeprazole (PRILOSEC) 20 MG capsule, Take 1 capsule (20 mg total) by mouth 2 (two) times daily as needed., Disp: 180 capsule, Rfl: 1 .  Probiotic Product (PROBIOTIC DAILY PO), Take 1 tablet by mouth daily., Disp: , Rfl:  .  sertraline (ZOLOFT) 100 MG tablet, Take 200 mg by mouth daily. , Disp: , Rfl:   Allergies  Allergen Reactions  . Lamictal [Lamotrigine] Itching and Swelling    Past Medical History:  Diagnosis Date  . Anxiety   . Arthritis   . Complication of anesthesia    slow to wake up-does not take much medicine to sedate her  . Depression   . Esophageal reflux 08/15/2015  . Hyperlipidemia 08/15/2015  . Paget disease of bone   . Wears glasses     Past Surgical History:  Procedure Laterality Date  . ABDOMINAL HYSTERECTOMY  8/11   TAH  . CERVICAL SPINE SURGERY  1/13  . DILATION AND CURETTAGE OF UTERUS  1986  . KNEE ARTHROSCOPY Left 07/22/2013   Procedure: LEFT KNEE ARTHROSCOPY WITH DEBRIDEMENT CHONDROPLASTY, REMOVAL LOOSE BODIES, PARTIAL MEDIAL MENISCECTOMY, OPEN EXCISION  OF PES GANGLION;  Surgeon: [name], MD;  Location: [facility];  Service: Orthopedics;  Laterality: Left;  NO SPECIMEN SENT PER DR. [name] ORDER.  . TUBAL LIGATION  1989    Family History  Problem Relation Age of Onset  . Thyroid disease Mother   . Congestive Heart Failure Mother   . Heart disease Mother   . Lung cancer Sister   . Melanoma Sister   . Healthy Brother   . Healthy Brother   . Healthy Brother   . Healthy Brother   . Heart disease Father     Social History   Socioeconomic History  . Marital status: Divorced    Spouse name: Not on file  . Number of children: Not on file  . Years of education: Not on file  . Highest education level: Not on file    Occupational History  . Not on file  Tobacco Use  . Smoking status: Former Smoker    Quit date: 01/02/1985    Years since quitting: 34.9  . Smokeless tobacco: Never Used  Substance and Sexual Activity  . Alcohol use: Yes    Comment: occassional  . Drug use: No  . Sexual activity: Not on file  Other Topics Concern  . Not on file  Social History Narrative  . Not on file   Social Determinants of Health   Financial Resource Strain:   . Difficulty of Paying Living Expenses:   Food Insecurity:   . Worried About Programme researcher, broadcasting/film/video in the Last Year:   . Barista in the Last Year:   Transportation Needs:   . Freight forwarder (Medical):   [name] Lack of Transportation (Non-Medical):   Physical Activity:   . Days of Exercise per Week:   . Minutes of Exercise per Session:   Stress:   . Feeling of Stress :   Social Connections:   . Frequency of Communication with Friends and Family:   . Frequency of Social Gatherings with Friends and Family:   . Attends Religious Services:   . Active Member of Clubs or Organizations:   . Attends Banker Meetings:   [name] Marital Status:   Intimate Partner Violence:   . Fear of Current or Ex-Partner:   . Emotionally Abused:   [name] Physically Abused:   . Sexually Abused:       Objective:    LMP 01/02/2010   Wt Readings from Last 3 Encounters:  06/25/19 171 lb 12.8 oz (77.9 kg)  11/23/18 160 lb (72.6 kg)  06/10/18 168 lb 8 oz (76.4 kg)    Physical Exam  Lab Results  Component Value Date   TSH 1.096 07/06/2015   Lab Results  Component Value Date   WBC 5.7 06/25/2019   HGB 13.8 06/25/2019   HCT 41.9 06/25/2019   MCV 94 06/25/2019   PLT 260 06/25/2019   Lab Results  Component Value Date   NA 141 06/25/2019   K 4.9 06/25/2019   CO2 24 06/25/2019   GLUCOSE 105 (H) 06/25/2019   BUN 18 06/25/2019   CREATININE 1.16 (H) 06/25/2019   BILITOT 0.5 06/25/2019   ALKPHOS 97 06/25/2019   AST 19 06/25/2019   ALT 13  06/25/2019   PROT 7.2 06/25/2019   ALBUMIN 4.7 06/25/2019   CALCIUM 10.1 06/25/2019   ANIONGAP 8 12/05/2018   Lab Results  Component Value Date   CHOL 207 (H) 06/25/2019   Lab Results  Component  Value Date   HDL 71 06/25/2019   Lab Results  Component Value Date   LDLCALC 118 (H) 06/25/2019   Lab Results  Component Value Date   TRIG 104 06/25/2019   Lab Results  Component Value Date   CHOLHDL 2.9 06/25/2019   Lab Results  Component Value Date   HGBA1C 5.5 07/06/2015

## 2019-12-22 DIAGNOSIS — R69 Illness, unspecified: Secondary | ICD-10-CM | POA: Diagnosis not present

## 2019-12-23 ENCOUNTER — Ambulatory Visit (INDEPENDENT_AMBULATORY_CARE_PROVIDER_SITE_OTHER): Payer: Medicare HMO | Admitting: Family Medicine

## 2019-12-23 ENCOUNTER — Other Ambulatory Visit: Payer: Self-pay

## 2019-12-23 ENCOUNTER — Ambulatory Visit: Payer: Medicare HMO | Admitting: Family Medicine

## 2019-12-23 ENCOUNTER — Ambulatory Visit (INDEPENDENT_AMBULATORY_CARE_PROVIDER_SITE_OTHER): Payer: Medicare HMO

## 2019-12-23 ENCOUNTER — Encounter: Payer: Self-pay | Admitting: Family Medicine

## 2019-12-23 VITALS — BP 113/96 | HR 70 | Temp 98.2°F | Ht 61.0 in | Wt 154.4 lb

## 2019-12-23 DIAGNOSIS — E785 Hyperlipidemia, unspecified: Secondary | ICD-10-CM

## 2019-12-23 DIAGNOSIS — R948 Abnormal results of function studies of other organs and systems: Secondary | ICD-10-CM

## 2019-12-23 DIAGNOSIS — H9313 Tinnitus, bilateral: Secondary | ICD-10-CM | POA: Diagnosis not present

## 2019-12-23 DIAGNOSIS — Z0001 Encounter for general adult medical examination with abnormal findings: Secondary | ICD-10-CM

## 2019-12-23 DIAGNOSIS — Z0000 Encounter for general adult medical examination without abnormal findings: Secondary | ICD-10-CM

## 2019-12-23 DIAGNOSIS — M1990 Unspecified osteoarthritis, unspecified site: Secondary | ICD-10-CM | POA: Diagnosis not present

## 2019-12-23 DIAGNOSIS — Z780 Asymptomatic menopausal state: Secondary | ICD-10-CM | POA: Diagnosis not present

## 2019-12-23 DIAGNOSIS — M5442 Lumbago with sciatica, left side: Secondary | ICD-10-CM

## 2019-12-23 DIAGNOSIS — G8929 Other chronic pain: Secondary | ICD-10-CM

## 2019-12-23 DIAGNOSIS — R69 Illness, unspecified: Secondary | ICD-10-CM | POA: Diagnosis not present

## 2019-12-23 DIAGNOSIS — M889 Osteitis deformans of unspecified bone: Secondary | ICD-10-CM

## 2019-12-23 DIAGNOSIS — M25511 Pain in right shoulder: Secondary | ICD-10-CM

## 2019-12-23 DIAGNOSIS — R42 Dizziness and giddiness: Secondary | ICD-10-CM

## 2019-12-23 DIAGNOSIS — F411 Generalized anxiety disorder: Secondary | ICD-10-CM

## 2019-12-23 DIAGNOSIS — K219 Gastro-esophageal reflux disease without esophagitis: Secondary | ICD-10-CM

## 2019-12-23 DIAGNOSIS — K589 Irritable bowel syndrome without diarrhea: Secondary | ICD-10-CM

## 2019-12-23 DIAGNOSIS — F331 Major depressive disorder, recurrent, moderate: Secondary | ICD-10-CM

## 2019-12-23 DIAGNOSIS — M545 Low back pain: Secondary | ICD-10-CM | POA: Diagnosis not present

## 2019-12-23 MED ORDER — MELOXICAM 15 MG PO TABS: 15.0000 mg | ORAL_TABLET | Freq: Every day | ORAL | 3 refills | Status: DC | PRN

## 2019-12-23 MED ORDER — OMEPRAZOLE 40 MG PO CPDR: 40.0000 mg | DELAYED_RELEASE_CAPSULE | Freq: Every day | ORAL | 3 refills | Status: DC

## 2019-12-23 MED ORDER — ATORVASTATIN CALCIUM 20 MG PO TABS: 20.0000 mg | ORAL_TABLET | Freq: Every evening | ORAL | 3 refills | Status: DC

## 2019-12-23 NOTE — Progress Notes (Signed)
Assessment & Plan:  1. Well adult exam - Preventive health education provided. Encouraged to schedule appointment with dentist. Health maintenance is UTD. Patient declined HIV screening.  - CBC with Differential/Platelet - CMP14+EGFR - Lipid panel  2. Arthritis - Well controlled on current regimen.  - meloxicam (MOBIC) 15 MG tablet; Take 1 tablet (15 mg total) by mouth daily as needed for pain. TAKE 1 Tablet BY MOUTH ONCE DAILY AS NEEDED FOR PAIN  Dispense: 90 tablet; Refill: 3  3. Gastroesophageal reflux disease - Well controlled on current regimen.  - omeprazole (PRILOSEC) 40 MG capsule; Take 1 capsule (40 mg total) by mouth daily.  Dispense: 90 capsule; Refill: 3  4. Hyperlipidemia, unspecified hyperlipidemia type - Well controlled on current regimen.  - atorvastatin (LIPITOR) 20 MG tablet; Take 1 tablet (20 mg total) by mouth every evening.  Dispense: 90 tablet; Refill: 3  5. Postmenopausal estrogen deficiency - DG [facility] DEXA  6. Chronic bilateral low back pain with left-sided sciatica - Education provided on sciatica exercises. Patient has meloxicam she takes daily.  - DG Lumbar Spine 2-3 Views  7. Abnormal positron emission tomography (PET) scan - DG Lumbar Spine 2-3 Views  8. Tinnitus of both ears - Ambulatory referral to ENT  9. Dizziness - Ambulatory referral to ENT  10. GAD (generalized anxiety disorder) - Managed by [employer].  11. Moderate episode of recurrent major depressive disorder ([place]) - Managed by [employer].  12. Chronic right shoulder pain - Continue meloxicam daily.   13. Paget disease of bone - Seeing rhematology. DEXA scan today.   14. Irritable bowel syndrome, unspecified type - Well controlled on current regimen.    Follow-up: Return in about 6 months (around 06/24/2020) for follow-up of chronic medication conditions.   Hendricks Limes, MSN, APRN, FNP-C [facility]  Subjective:  Patient ID: Julia [name], female     DOB: [birth year]  Age: 59 y.o. MRN: [id number]  Patient Care Team: Loman Brooklyn, FNP as PCP - General (Family Medicine) Fransico Michael (Psychiatry)   CC:  Chief Complaint  Patient presents with  . Annual Exam    HPI YULIYA NOVA presents for her annual physical.   Occupation: disability, Marital status: divorced, Substance use: none Diet: soft foods right now due to recent surgery but has been eating healthy and losing weight, Exercise: none Last eye exam: couple months ago Last dental exam: few years ago Last colonoscopy: 09/09/2019 Last mammogram: 03/12/2019 Last pap smear: s/p hysterectomy Hepatitis C Screening: completed on 06/25/2019 Immunizations: Flu Vaccine: up to date Tdap Vaccine: up to date  Shingrix Vaccine: up to date   Depression screen [facility] 2/9 12/23/2019 06/25/2019 11/25/2013  Decreased Interest 3 3 2   Down, Depressed, Hopeless 2 2 2   PHQ - 2 Score 5 5 4   Altered sleeping 3 3 1   Tired, decreased energy 3 3 1   Change in appetite 1 3 1   Feeling bad or failure about yourself  2 2 2   Trouble concentrating 2 3 1   Moving slowly or fidgety/restless 2 2 1   Suicidal thoughts 0 0 1  PHQ-9 Score 18 21 12   Difficult doing work/chores Somewhat difficult - -   GAD 7 : Generalized Anxiety Score 12/23/2019 06/25/2019  Nervous, Anxious, on Edge 2 3  Control/stop worrying 2 3  Worry too much - different things 2 3  Trouble relaxing 2 3  Restless 1 2  Easily annoyed or irritable 1 3  Afraid - awful might happen 1  2  Total GAD 7 Score 11 19  Anxiety Difficulty Somewhat difficult -   Patient has concerns regarding a deep stabbing pain that she rates 8/10 that occurs in her left buttock and goes down the back of her left thigh. It occurs intermittently, lasting several minutes, every day. She has purchased a gel pad that she sits on in the car and she has been applying icy hot when it occurs - these things are somewhat helpful. She has not identified anything that brings  the pain on. Most of the time the pain just has to resolve on it's own. She did have a NM Bone Scan Whole Body on 09/03/2019 which showed fairly significant update in the L4 and L5 vertebral bodies suspicious for fractures with recommended correlation with radiographs or MRI. Patient does report multiple falls in her life. She has never had previous imaging of her lumbar spine.   Patient would also like to be referred to ENT for multiple symptoms she feels are related. She has had 24/7 tinnitus since January 2015. She has had hearing tests completed which were normal. She reports when she is lying down and she swallows she feels her head is in a wind tunnel. She feels unable to blow her nose. She says it is like something shuts in her throat when she tries to blow her nose. She has dizziness when lying down.    Review of Systems  Constitutional: Negative for chills, fever, malaise/fatigue and weight loss.  HENT: Positive for tinnitus. Negative for congestion, ear discharge, ear pain, nosebleeds, sinus pain and sore throat.   Eyes: Negative for blurred vision, double vision, pain, discharge and redness.  Respiratory: Negative for cough, shortness of breath and wheezing.   Cardiovascular: Negative for chest pain, palpitations and leg swelling.  Gastrointestinal: Negative for abdominal pain, constipation, diarrhea, heartburn, nausea and vomiting.  Genitourinary: Negative for dysuria, frequency and urgency.  Musculoskeletal: Positive for joint pain and neck pain. Negative for myalgias.  Skin: Negative for rash.  Neurological: Negative for dizziness, seizures, weakness and headaches.  Psychiatric/Behavioral: Negative for depression, substance abuse and suicidal ideas. The patient is not nervous/anxious.      Current Outpatient Medications:  .  atorvastatin (LIPITOR) 20 MG tablet, Take 1 tablet (20 mg total) by mouth every evening., Disp: 90 tablet, Rfl: 3 .  buPROPion (WELLBUTRIN XL) 150 MG 24 hr  tablet, Take 300 mg by mouth daily. , Disp: , Rfl:  .  gabapentin (NEURONTIN) 100 MG capsule, Take 1 capsule (100 mg total) by mouth at bedtime., Disp: 90 capsule, Rfl: 1 .  meloxicam (MOBIC) 15 MG tablet, Take 1 tablet (15 mg total) by mouth daily as needed for pain. TAKE 1 Tablet BY MOUTH ONCE DAILY AS NEEDED FOR PAIN, Disp: 90 tablet, Rfl: 3 .  methocarbamol (ROBAXIN) 500 MG tablet, Take 500 mg by mouth 3 (three) times daily as needed., Disp: , Rfl:  .  omeprazole (PRILOSEC) 40 MG capsule, Take 1 capsule (40 mg total) by mouth daily., Disp: 90 capsule, Rfl: 3 .  Probiotic Product (PROBIOTIC DAILY PO), Take 1 tablet by mouth daily., Disp: , Rfl:  .  sertraline (ZOLOFT) 100 MG tablet, Take 200 mg by mouth daily. , Disp: , Rfl:  .  traZODone (DESYREL) 50 MG tablet, Take 50 mg by mouth at bedtime., Disp: , Rfl:   Allergies  Allergen Reactions  . Lamictal [Lamotrigine] Itching and Swelling    Past Medical History:  Diagnosis Date  . Anxiety   .  Arthritis   . Complication of anesthesia    slow to wake up-does not take much medicine to sedate her  . Depression   . Esophageal reflux 08/15/2015  . Hyperlipidemia 08/15/2015  . Paget disease of bone   . Wears glasses     Past Surgical History:  Procedure Laterality Date  . ABDOMINAL HYSTERECTOMY  8/11   TAH  . CERVICAL SPINE SURGERY  1/13  . DILATION AND CURETTAGE OF UTERUS  1986  . fusion C3-C5    . KNEE ARTHROSCOPY Left 07/22/2013   Procedure: LEFT KNEE ARTHROSCOPY WITH DEBRIDEMENT CHONDROPLASTY, REMOVAL LOOSE BODIES, PARTIAL MEDIAL MENISCECTOMY, OPEN EXCISION OF PES GANGLION;  Surgeon: Kerin Salen, MD;  Location: [place];  Service: Orthopedics;  Laterality: Left;  NO SPECIMEN SENT PER DR. Mayer Camel ORDER.  . TUBAL LIGATION  1989    Family History  Problem Relation Age of Onset  . Thyroid disease Mother   . Congestive Heart Failure Mother   . Heart disease Mother   . Lung cancer Sister   . Melanoma Sister   .  Healthy Brother   . Healthy Brother   . Healthy Brother   . Healthy Brother   . Heart disease Father     Social History   Socioeconomic History  . Marital status: Divorced    Spouse name: Not on file  . Number of children: Not on file  . Years of education: Not on file  . Highest education level: Not on file  Occupational History  . Not on file  Tobacco Use  . Smoking status: Former Smoker    Quit date: 01/02/1985    Years since quitting: 34.9  . Smokeless tobacco: Never Used  Substance and Sexual Activity  . Alcohol use: Yes    Comment: occassional  . Drug use: No  . Sexual activity: Not on file  Other Topics Concern  . Not on file  Social History Narrative  . Not on file   Social Determinants of Health   Financial Resource Strain:   . Difficulty of Paying Living Expenses:   Food Insecurity:   . Worried About Charity fundraiser in the Last Year:   . Arboriculturist in the Last Year:   Transportation Needs:   . Film/video editor (Medical):   [name] Lack of Transportation (Non-Medical):   Physical Activity:   . Days of Exercise per Week:   . Minutes of Exercise per Session:   Stress:   . Feeling of Stress :   Social Connections:   . Frequency of Communication with Friends and Family:   . Frequency of Social Gatherings with Friends and Family:   . Attends Religious Services:   . Active Member of Clubs or Organizations:   . Attends Archivist Meetings:   [name] Marital Status:   Intimate Partner Violence:   . Fear of Current or Ex-Partner:   . Emotionally Abused:   [name] Physically Abused:   . Sexually Abused:       Objective:    BP (!) 113/96   Pulse 70   Temp 98.2 F (36.8 C) (Temporal)   Ht 5' 1"  (1.549 m)   Wt 154 lb 6.4 oz (70 kg)   LMP 01/02/2010   SpO2 98%   BMI 29.17 kg/m   Wt Readings from Last 3 Encounters:  12/23/19 154 lb 6.4 oz (70 kg)  06/25/19 171 lb 12.8 oz (77.9 kg)  11/23/18 160 lb (72.6 kg)  Physical Exam Vitals  reviewed.  Constitutional:      General: She is not in acute distress.    Appearance: Normal appearance. She is overweight. She is not ill-appearing, toxic-appearing or diaphoretic.  HENT:     Head: Normocephalic and atraumatic.     Right Ear: Tympanic membrane, ear canal and external ear normal. There is no impacted cerumen.     Left Ear: Tympanic membrane, ear canal and external ear normal. There is no impacted cerumen.     Nose: Nose normal. No congestion or rhinorrhea.     Mouth/Throat:     Mouth: Mucous membranes are moist.     Pharynx: Oropharynx is clear. No oropharyngeal exudate or posterior oropharyngeal erythema.  Eyes:     General: No scleral icterus.       Right eye: No discharge.        Left eye: No discharge.     Conjunctiva/sclera: Conjunctivae normal.     Pupils: Pupils are equal, round, and reactive to light.  Cardiovascular:     Rate and Rhythm: Normal rate and regular rhythm.     Heart sounds: Normal heart sounds. No murmur. No friction rub. No gallop.   Pulmonary:     Effort: Pulmonary effort is normal. No respiratory distress.     Breath sounds: Normal breath sounds. No stridor. No wheezing, rhonchi or rales.  Abdominal:     General: Abdomen is flat. Bowel sounds are normal. There is no distension.     Palpations: Abdomen is soft. There is no mass.     Tenderness: There is no abdominal tenderness. There is no guarding or rebound.     Hernia: No hernia is present.  Musculoskeletal:     Cervical back: Neck supple. No rigidity. No muscular tenderness. Decreased range of motion.  Lymphadenopathy:     Cervical: No cervical adenopathy.  Skin:    General: Skin is warm and dry.     Capillary Refill: Capillary refill takes less than 2 seconds.     Comments: Surgical incision healing well to anterior neck.   Neurological:     General: No focal deficit present.     Mental Status: She is alert and oriented to person, place, and time. Mental status is at baseline.   Psychiatric:        Mood and Affect: Mood normal.        Behavior: Behavior normal.        Thought Content: Thought content normal.        Judgment: Judgment normal.     Lab Results  Component Value Date   TSH 1.096 07/06/2015   Lab Results  Component Value Date   WBC 5.7 06/25/2019   HGB 13.8 06/25/2019   HCT 41.9 06/25/2019   MCV 94 06/25/2019   PLT 260 06/25/2019   Lab Results  Component Value Date   NA 141 06/25/2019   K 4.9 06/25/2019   CO2 24 06/25/2019   GLUCOSE 105 (H) 06/25/2019   BUN 18 06/25/2019   CREATININE 1.16 (H) 06/25/2019   BILITOT 0.5 06/25/2019   ALKPHOS 97 06/25/2019   AST 19 06/25/2019   ALT 13 06/25/2019   PROT 7.2 06/25/2019   ALBUMIN 4.7 06/25/2019   CALCIUM 10.1 06/25/2019   ANIONGAP 8 12/05/2018   Lab Results  Component Value Date   CHOL 207 (H) 06/25/2019   Lab Results  Component Value Date   HDL 71 06/25/2019   Lab Results  Component Value Date  [place] 118 (H) 06/25/2019   Lab Results  Component Value Date   TRIG 104 06/25/2019   Lab Results  Component Value Date   CHOLHDL 2.9 06/25/2019   Lab Results  Component Value Date   HGBA1C 5.5 07/06/2015

## 2019-12-23 NOTE — Patient Instructions (Addendum)
Sciatica Rehab Ask your health care provider which exercises are safe for you. Do exercises exactly as told by your health care provider and adjust them as directed. It is normal to feel mild stretching, pulling, tightness, or discomfort as you do these exercises. Stop right away if you feel sudden pain or your pain gets worse. Do not begin these exercises until told by your health care provider. Stretching and range-of-motion exercises These exercises warm up your muscles and joints and improve the movement and flexibility of your hips and back. These exercises also help to relieve pain, numbness, and tingling. Sciatic nerve glide 1. Sit in a chair with your head facing down toward your chest. Place your hands behind your back. Let your shoulders slump forward. 2. Slowly straighten one of your legs while you tilt your head back as if you are looking toward the ceiling. Only straighten your leg as far as you can without making your symptoms worse. 3. Hold this position for __________ seconds. 4. Slowly return to the starting position. 5. Repeat with your other leg. Repeat __________ times. Complete this exercise __________ times a day. Knee to chest with hip adduction and internal rotation  1. Lie on your back on a firm surface with both legs straight. 2. Bend one of your knees and move it up toward your chest until you feel a gentle stretch in your lower back and buttock. Then, move your knee toward the shoulder that is on the opposite side from your leg. This is hip adduction and internal rotation. ? Hold your leg in this position by holding on to the front of your knee. 3. Hold this position for __________ seconds. 4. Slowly return to the starting position. 5. Repeat with your other leg. Repeat __________ times. Complete this exercise __________ times a day. Prone extension on elbows  1. Lie on your abdomen on a firm surface. A bed may be too soft for this exercise. 2. Prop yourself up  on your elbows. 3. Use your arms to help lift your chest up until you feel a gentle stretch in your abdomen and your lower back. ? This will place some of your body weight on your elbows. If this is uncomfortable, try stacking pillows under your chest. ? Your hips should stay down, against the surface that you are lying on. Keep your hip and back muscles relaxed. 4. Hold this position for __________ seconds. 5. Slowly relax your upper body and return to the starting position. Repeat __________ times. Complete this exercise __________ times a day. Strengthening exercises These exercises build strength and endurance in your back. Endurance is the ability to use your muscles for a long time, even after they get tired. Pelvic tilt This exercise strengthens the muscles that lie deep in the abdomen. 1. Lie on your back on a firm surface. Bend your knees and keep your feet flat on the floor. 2. Tense your abdominal muscles. Tip your pelvis up toward the ceiling and flatten your lower back into the floor. ? To help with this exercise, you may place a small towel under your lower back and try to push your back into the towel. 3. Hold this position for __________ seconds. 4. Let your muscles relax completely before you repeat this exercise. Repeat __________ times. Complete this exercise __________ times a day. Alternating arm and leg raises  1. Get on your hands and knees on a firm surface. If you are on a hard floor, you may want to  use padding, such as an exercise mat, to cushion your knees. 2. Line up your arms and legs. Your hands should be directly below your shoulders, and your knees should be directly below your hips. 3. Lift your left leg behind you. At the same time, raise your right arm and straighten it in front of you. ? Do not lift your leg higher than your hip. ? Do not lift your arm higher than your shoulder. ? Keep your abdominal and back muscles tight. ? Keep your hips facing the  ground. ? Do not arch your back. ? Keep your balance carefully, and do not hold your breath. 4. Hold this position for __________ seconds. 5. Slowly return to the starting position. 6. Repeat with your right leg and your left arm. Repeat __________ times. Complete this exercise __________ times a day. Posture and body mechanics Good posture and healthy body mechanics can help to relieve stress in your body's tissues and joints. Body mechanics refers to the movements and positions of your body while you do your daily activities. Posture is part of body mechanics. Good posture means:  Your spine is in its natural S-curve position (neutral).  Your shoulders are pulled back slightly.  Your head is not tipped forward. Follow these guidelines to improve your posture and body mechanics in your everyday activities. Standing   When standing, keep your spine neutral and your feet about hip width apart. Keep a slight bend in your knees. Your ears, shoulders, and hips should line up.  When you do a task in which you stand in one place for a long time, place one foot up on a stable object that is 2-4 inches (5-10 cm) high, such as a footstool. This helps keep your spine neutral. Sitting   When sitting, keep your spine neutral and keep your feet flat on the floor. Use a footrest, if necessary, and keep your thighs parallel to the floor. Avoid rounding your shoulders, and avoid tilting your head forward.  When working at a desk or a computer, keep your desk at a height where your hands are slightly lower than your elbows. Slide your chair under your desk so you are close enough to maintain good posture.  When working at a computer, place your monitor at a height where you are looking straight ahead and you do not have to tilt your head forward or downward to look at the screen. Resting  When lying down and resting, avoid positions that are most painful for you.  If you have pain with activities  such as sitting, bending, stooping, or squatting, lie in a position in which your body does not bend very much. For example, avoid curling up on your side with your arms and knees near your chest (fetal position).  If you have pain with activities such as standing for a long time or reaching with your arms, lie with your spine in a neutral position and bend your knees slightly. Try the following positions: ? Lying on your side with a pillow between your knees. ? Lying on your back with a pillow under your knees. Lifting   When lifting objects, keep your feet at least shoulder width apart and tighten your abdominal muscles.  Bend your knees and hips and keep your spine neutral. It is important to lift using the strength of your legs, not your back. Do not lock your knees straight out.  Always ask for help to lift heavy or awkward objects. This information is  not intended to replace advice given to you by your health care provider. Make sure you discuss any questions you have with your health care provider. Document Revised: 01/09/2019 Document Reviewed: 10/09/2018 Elsevier Patient Education  2020 [employer]   Preventive Care 4-37 Years Old, Female Preventive care refers to visits with your health care provider and lifestyle choices that can promote health and wellness. This includes:  A yearly physical exam. This may also be called an annual well check.  Regular dental visits and eye exams.  Immunizations.  Screening for certain conditions.  Healthy lifestyle choices, such as eating a healthy diet, getting regular exercise, not using drugs or products that contain nicotine and tobacco, and limiting alcohol use. What can I expect for my preventive care visit? Physical exam Your health care provider will check your:  Height and weight. This may be used to calculate body mass index (BMI), which tells if you are at a healthy weight.  Heart rate and blood pressure.  Skin for  abnormal spots. Counseling Your health care provider may ask you questions about your:  Alcohol, tobacco, and drug use.  Emotional well-being.  Home and relationship well-being.  Sexual activity.  Eating habits.  Work and work Statistician.  Method of birth control.  Menstrual cycle.  Pregnancy history. What immunizations do I need?  Influenza (flu) vaccine  This is recommended every year. Tetanus, diphtheria, and pertussis (Tdap) vaccine  You may need a Td booster every 10 years. Varicella (chickenpox) vaccine  You may need this if you have not been vaccinated. Zoster (shingles) vaccine  You may need this after age 74. Measles, mumps, and rubella (MMR) vaccine  You may need at least one dose of MMR if you were born in 1957 or later. You may also need a second dose. Pneumococcal conjugate (PCV13) vaccine  You may need this if you have certain conditions and were not previously vaccinated. Pneumococcal polysaccharide (PPSV23) vaccine  You may need one or two doses if you smoke cigarettes or if you have certain conditions. Meningococcal conjugate (MenACWY) vaccine  You may need this if you have certain conditions. Hepatitis A vaccine  You may need this if you have certain conditions or if you travel or work in places where you may be exposed to hepatitis A. Hepatitis B vaccine  You may need this if you have certain conditions or if you travel or work in places where you may be exposed to hepatitis B. Haemophilus influenzae type b (Hib) vaccine  You may need this if you have certain conditions. Human papillomavirus (HPV) vaccine  If recommended by your health care provider, you may need three doses over 6 months. You may receive vaccines as individual doses or as more than one vaccine together in one shot (combination vaccines). Talk with your health care provider about the risks and benefits of combination vaccines. What tests do I need? Blood tests  Lipid  and cholesterol levels. These may be checked every 5 years, or more frequently if you are over 103 years old.  Hepatitis C test.  Hepatitis B test. Screening  Lung cancer screening. You may have this screening every year starting at age 81 if you have a 30-pack-year history of smoking and currently smoke or have quit within the past 15 years.  Colorectal cancer screening. All adults should have this screening starting at age 60 and continuing until age 28. Your health care provider may recommend screening at age 26 if you are at increased risk.  You will have tests every 1-10 years, depending on your results and the type of screening test.  Diabetes screening. This is done by checking your blood sugar (glucose) after you have not eaten for a while (fasting). You may have this done every 1-3 years.  Mammogram. This may be done every 1-2 years. Talk with your health care provider about when you should start having regular mammograms. This may depend on whether you have a family history of breast cancer.  BRCA-related cancer screening. This may be done if you have a family history of breast, ovarian, tubal, or peritoneal cancers.  Pelvic exam and Pap test. This may be done every 3 years starting at age 38. Starting at age 40, this may be done every 5 years if you have a Pap test in combination with an HPV test. Other tests  Sexually transmitted disease (STD) testing.  Bone density scan. This is done to screen for osteoporosis. You may have this scan if you are at high risk for osteoporosis. Follow these instructions at home: Eating and drinking  Eat a diet that includes fresh fruits and vegetables, whole grains, lean protein, and low-fat dairy.  Take vitamin and mineral supplements as recommended by your health care provider.  Do not drink alcohol if: ? Your health care provider tells you not to drink. ? You are pregnant, may be pregnant, or are planning to become pregnant.  If you drink  alcohol: ? Limit how much you have to 0-1 drink a day. ? Be aware of how much alcohol is in your drink. In the [place] one drink equals one 12 oz bottle of beer (355 mL), one 5 oz glass of wine (148 mL), or one 1 oz glass of hard liquor (44 mL). Lifestyle  Take daily care of your teeth and gums.  Stay active. Exercise for at least 30 minutes on 5 or more days each week.  Do not use any products that contain nicotine or tobacco, such as cigarettes, e-cigarettes, and chewing tobacco. If you need help quitting, ask your health care provider.  If you are sexually active, practice safe sex. Use a condom or other form of birth control (contraception) in order to prevent pregnancy and STIs (sexually transmitted infections).  If told by your health care provider, take low-dose aspirin daily starting at age 32. What's next?  Visit your health care provider once a year for a well check visit.  Ask your health care provider how often you should have your eyes and teeth checked.  Stay up to date on all vaccines. This information is not intended to replace advice given to you by your health care provider. Make sure you discuss any questions you have with your health care provider. Document Revised: 05/29/2018 Document Reviewed: 05/29/2018 Elsevier Patient Education  2020 [employer].

## 2019-12-23 NOTE — Progress Notes (Signed)
Done and will enter in chart

## 2019-12-24 LAB — CBC WITH DIFFERENTIAL/PLATELET
Basophils Absolute: 0 10*3/uL (ref 0.0–0.2)
Basos: 1 %
EOS (ABSOLUTE): 0.1 10*3/uL (ref 0.0–0.4)
Eos: 2 %
Hematocrit: 42.4 % (ref 34.0–46.6)
Hemoglobin: 13.7 g/dL (ref 11.1–15.9)
Immature Grans (Abs): 0 10*3/uL (ref 0.0–0.1)
Immature Granulocytes: 0 %
Lymphocytes Absolute: 1 10*3/uL (ref 0.7–3.1)
Lymphs: 18 %
MCH: 30.1 pg (ref 26.6–33.0)
MCHC: 32.3 g/dL (ref 31.5–35.7)
MCV: 93 fL (ref 79–97)
Monocytes Absolute: 0.3 10*3/uL (ref 0.1–0.9)
Monocytes: 6 %
Neutrophils Absolute: 3.8 10*3/uL (ref 1.4–7.0)
Neutrophils: 73 %
Platelets: 257 10*3/uL (ref 150–450)
RBC: 4.55 x10E6/uL (ref 3.77–5.28)
RDW: 13.1 % (ref 11.7–15.4)
WBC: 5.2 10*3/uL (ref 3.4–10.8)

## 2019-12-24 LAB — CMP14+EGFR
ALT: 9 IU/L (ref 0–32)
AST: 18 IU/L (ref 0–40)
Albumin/Globulin Ratio: 2.1 (ref 1.2–2.2)
Albumin: 4.7 g/dL (ref 3.8–4.9)
Alkaline Phosphatase: 83 IU/L (ref 39–117)
BUN/Creatinine Ratio: 13 (ref 9–23)
BUN: 12 mg/dL (ref 6–24)
Bilirubin Total: 0.3 mg/dL (ref 0.0–1.2)
CO2: 23 mmol/L (ref 20–29)
Calcium: 9.9 mg/dL (ref 8.7–10.2)
Chloride: 107 mmol/L — ABNORMAL HIGH (ref 96–106)
Creatinine, Ser: 0.93 mg/dL (ref 0.57–1.00)
GFR calc Af Amer: 78 mL/min/{1.73_m2} (ref >59)
GFR calc non Af Amer: 68 mL/min/{1.73_m2} (ref >59)
Globulin, Total: 2.2 g/dL (ref 1.5–4.5)
Glucose: 86 mg/dL (ref 65–99)
Potassium: 4.6 mmol/L (ref 3.5–5.2)
Sodium: 143 mmol/L (ref 134–144)
Total Protein: 6.9 g/dL (ref 6.0–8.5)

## 2019-12-24 LAB — LIPID PANEL
Chol/HDL Ratio: 3 ratio (ref 0.0–4.4)
Cholesterol, Total: 206 mg/dL — ABNORMAL HIGH (ref 100–199)
HDL: 69 mg/dL (ref >39)
LDL Chol Calc (NIH): 117 mg/dL — ABNORMAL HIGH (ref 0–99)
Triglycerides: 114 mg/dL (ref 0–149)
VLDL Cholesterol Cal: 20 mg/dL (ref 5–40)

## 2019-12-29 DIAGNOSIS — M1990 Unspecified osteoarthritis, unspecified site: Secondary | ICD-10-CM | POA: Diagnosis not present

## 2019-12-29 DIAGNOSIS — G4700 Insomnia, unspecified: Secondary | ICD-10-CM | POA: Diagnosis not present

## 2019-12-29 DIAGNOSIS — E785 Hyperlipidemia, unspecified: Secondary | ICD-10-CM | POA: Diagnosis not present

## 2019-12-29 DIAGNOSIS — G8929 Other chronic pain: Secondary | ICD-10-CM | POA: Diagnosis not present

## 2019-12-29 DIAGNOSIS — K219 Gastro-esophageal reflux disease without esophagitis: Secondary | ICD-10-CM | POA: Diagnosis not present

## 2019-12-29 DIAGNOSIS — K5900 Constipation, unspecified: Secondary | ICD-10-CM | POA: Diagnosis not present

## 2019-12-29 DIAGNOSIS — Z791 Long term (current) use of non-steroidal anti-inflammatories (NSAID): Secondary | ICD-10-CM | POA: Diagnosis not present

## 2019-12-29 DIAGNOSIS — Z809 Family history of malignant neoplasm, unspecified: Secondary | ICD-10-CM | POA: Diagnosis not present

## 2019-12-29 DIAGNOSIS — M792 Neuralgia and neuritis, unspecified: Secondary | ICD-10-CM | POA: Diagnosis not present

## 2019-12-29 DIAGNOSIS — R69 Illness, unspecified: Secondary | ICD-10-CM | POA: Diagnosis not present

## 2020-01-14 DIAGNOSIS — Z9889 Other specified postprocedural states: Secondary | ICD-10-CM | POA: Diagnosis not present

## 2020-01-14 DIAGNOSIS — Z981 Arthrodesis status: Secondary | ICD-10-CM | POA: Diagnosis not present

## 2020-01-21 DIAGNOSIS — R1312 Dysphagia, oropharyngeal phase: Secondary | ICD-10-CM | POA: Diagnosis not present

## 2020-01-30 DIAGNOSIS — M48061 Spinal stenosis, lumbar region without neurogenic claudication: Secondary | ICD-10-CM | POA: Diagnosis not present

## 2020-02-23 DIAGNOSIS — R69 Illness, unspecified: Secondary | ICD-10-CM | POA: Diagnosis not present

## 2020-03-17 DIAGNOSIS — Z888 Allergy status to other drugs, medicaments and biological substances status: Secondary | ICD-10-CM | POA: Diagnosis not present

## 2020-03-17 DIAGNOSIS — Z981 Arthrodesis status: Secondary | ICD-10-CM | POA: Diagnosis not present

## 2020-03-17 DIAGNOSIS — M79605 Pain in left leg: Secondary | ICD-10-CM | POA: Diagnosis not present

## 2020-03-17 DIAGNOSIS — M4316 Spondylolisthesis, lumbar region: Secondary | ICD-10-CM | POA: Diagnosis not present

## 2020-03-17 DIAGNOSIS — R200 Anesthesia of skin: Secondary | ICD-10-CM | POA: Diagnosis not present

## 2020-03-17 DIAGNOSIS — Z79899 Other long term (current) drug therapy: Secondary | ICD-10-CM | POA: Diagnosis not present

## 2020-03-30 DIAGNOSIS — R200 Anesthesia of skin: Secondary | ICD-10-CM | POA: Diagnosis not present

## 2020-03-30 DIAGNOSIS — M5416 Radiculopathy, lumbar region: Secondary | ICD-10-CM | POA: Diagnosis not present

## 2020-03-30 DIAGNOSIS — Z888 Allergy status to other drugs, medicaments and biological substances status: Secondary | ICD-10-CM | POA: Diagnosis not present

## 2020-03-30 DIAGNOSIS — Z981 Arthrodesis status: Secondary | ICD-10-CM | POA: Diagnosis not present

## 2020-04-05 DIAGNOSIS — R69 Illness, unspecified: Secondary | ICD-10-CM | POA: Diagnosis not present

## 2020-04-05 DIAGNOSIS — M5416 Radiculopathy, lumbar region: Secondary | ICD-10-CM | POA: Diagnosis not present

## 2020-04-12 DIAGNOSIS — R69 Illness, unspecified: Secondary | ICD-10-CM | POA: Diagnosis not present

## 2020-04-19 DIAGNOSIS — R69 Illness, unspecified: Secondary | ICD-10-CM | POA: Diagnosis not present

## 2020-04-20 DIAGNOSIS — M5416 Radiculopathy, lumbar region: Secondary | ICD-10-CM | POA: Diagnosis not present

## 2020-04-20 DIAGNOSIS — M48061 Spinal stenosis, lumbar region without neurogenic claudication: Secondary | ICD-10-CM | POA: Diagnosis not present

## 2020-04-20 DIAGNOSIS — M5136 Other intervertebral disc degeneration, lumbar region: Secondary | ICD-10-CM | POA: Diagnosis not present

## 2020-04-26 DIAGNOSIS — R69 Illness, unspecified: Secondary | ICD-10-CM | POA: Diagnosis not present

## 2020-05-03 ENCOUNTER — Ambulatory Visit (INDEPENDENT_AMBULATORY_CARE_PROVIDER_SITE_OTHER): Payer: Medicare HMO | Admitting: Family Medicine

## 2020-05-03 ENCOUNTER — Other Ambulatory Visit: Payer: Self-pay

## 2020-05-03 ENCOUNTER — Encounter: Payer: Self-pay | Admitting: Family Medicine

## 2020-05-03 VITALS — BP 116/77 | HR 66 | Temp 97.3°F | Ht 61.0 in | Wt 161.8 lb

## 2020-05-03 DIAGNOSIS — Z0000 Encounter for general adult medical examination without abnormal findings: Secondary | ICD-10-CM

## 2020-05-03 NOTE — Progress Notes (Signed)
WELCOME TO MEDICARE (IPPE) VISIT  05/03/2020  [name] DOB:[birth year]     ZOX:[id number]  I explained that today's visit was for the purpose of health promotion and disease detection, as well as an introduction to Medicare and it's covered benefits.  I explained that no labs or other services would be performed today. If labs or other services are determined to be necessary the appropriate orders/referrals will be arranged for these to be done at a future date.  Julia [name] is a 59 y.o. year old female primary care patient of Julia Boston, FNP.   MEDICAL AND SOCIAL HISTORY Past Medical History Past Medical History:  Diagnosis Date  . Anxiety   . Arthritis   . Complication of anesthesia    slow to wake up-does not take much medicine to sedate her  . Depression   . Esophageal reflux 08/15/2015  . Hyperlipidemia 08/15/2015  . Paget disease of bone   . Wears glasses     Surgical History Past Surgical History:  Procedure Laterality Date  . ABDOMINAL HYSTERECTOMY  8/11   TAH  . ANTERIOR FUSION CERVICAL SPINE  10/2019   C3-C5  . CERVICAL SPINE SURGERY  1/13  . DILATION AND CURETTAGE OF UTERUS  1986  . KNEE ARTHROSCOPY Left 07/22/2013   Procedure: LEFT KNEE ARTHROSCOPY WITH DEBRIDEMENT CHONDROPLASTY, REMOVAL LOOSE BODIES, PARTIAL MEDIAL MENISCECTOMY, OPEN EXCISION OF PES GANGLION;  Surgeon: Julia Lewandowsky, MD;  Location: [facility];  Service: Orthopedics;  Laterality: Left;  NO SPECIMEN SENT PER DR. [name] ORDER.  . TUBAL LIGATION  1989    Family History Family History  Problem Relation Age of Onset  . Thyroid disease Mother   . Congestive Heart Failure Mother   . Heart disease Mother   . Lung cancer Sister   . Melanoma Sister   . Healthy Brother   . Healthy Brother   . Healthy Brother   . Healthy Brother   . Heart disease Father     Social History Social History   Socioeconomic History  . Marital status: Divorced    Spouse name: Not on file   . Number of children: Not on file  . Years of education: Not on file  . Highest education level: Not on file  Occupational History  . Not on file  Tobacco Use  . Smoking status: Former Smoker    Quit date: 01/02/1985    Years since quitting: 35.3  . Smokeless tobacco: Never Used  Vaping Use  . Vaping Use: Never used  Substance and Sexual Activity  . Alcohol use: Yes    Comment: occassional  . Drug use: No  . Sexual activity: Not on file  Other Topics Concern  . Not on file  Social History Narrative  . Not on file   Social Determinants of Health   Financial Resource Strain:   . Difficulty of Paying Living Expenses:   Food Insecurity:   . Worried About Programme researcher, broadcasting/film/video in the Last Year:   . Barista in the Last Year:   Transportation Needs:   . Freight forwarder (Medical):   [name] Lack of Transportation (Non-Medical):   Physical Activity:   . Days of Exercise per Week:   . Minutes of Exercise per Session:   Stress:   . Feeling of Stress :   Social Connections:   . Frequency of Communication with Friends and Family:   . Frequency of Social Gatherings with Friends and  Family:   . Attends Religious Services:   . Active Member of Clubs or Organizations:   . Attends Banker Meetings:   [name] Marital Status:     Current Medications & Allergies Outpatient Encounter Medications as of 05/03/2020  Medication Sig  . atorvastatin (LIPITOR) 20 MG tablet Take 1 tablet (20 mg total) by mouth every evening.  [name] buPROPion (WELLBUTRIN XL) 150 MG 24 hr tablet Take 300 mg by mouth daily.   [name] gabapentin (NEURONTIN) 100 MG capsule Take 1 capsule (100 mg total) by mouth at bedtime. (Patient taking differently: Take 300 mg by mouth 2 (two) times daily. )  . meloxicam (MOBIC) 15 MG tablet Take 1 tablet (15 mg total) by mouth daily as needed for pain. TAKE 1 Tablet BY MOUTH ONCE DAILY AS NEEDED FOR PAIN  . omeprazole (PRILOSEC) 40 MG capsule Take 1 capsule (40 mg total) by  mouth daily.  . Probiotic Product (PROBIOTIC DAILY PO) Take 1 tablet by mouth daily.  . sertraline (ZOLOFT) 100 MG tablet Take 200 mg by mouth daily.   . traZODone (DESYREL) 50 MG tablet Take 50 mg by mouth at bedtime.  . [DISCONTINUED] methocarbamol (ROBAXIN) 500 MG tablet Take 500 mg by mouth 3 (three) times daily as needed.   No facility-administered encounter medications on file as of 05/03/2020.   Lamictal [lamotrigine]  Diet & Physical Activity     Pt consumes 2 meals a day and 2 snacks a day.  The patient feels that she mostly follow a Regular diet.   DEPRESSION SCREENING PHQ 2/9 Scores 05/03/2020 12/23/2019 06/25/2019 11/25/2013 11/11/2013  PHQ - 2 Score 5 5 5 4 4   PHQ- 9 Score 21 18 21 12 14      FUNCTIONAL ABILITY & LEVEL OF SAFETY Fall Risk Fall Risk  05/03/2020 12/23/2019 11/11/2013  Falls in the past year? 0 0 No    Activities of Daily Living In your present state of health, do you have any difficulty performing the following activities: 05/03/2020  Hearing? N  Vision? N  Difficulty concentrating or making decisions? Y  Walking or climbing stairs? Y  Dressing or bathing? N  Doing errands, shopping? Y  Some recent data might be hidden    Cognitive Function MMSE - Mini Mental State Exam 05/03/2020  Orientation to time 5  Orientation to Place 5  Registration 3  Attention/ Calculation 5  Recall 2  Language- name 2 objects 2  Language- repeat 1  Language- follow 3 step command 3  Language- read & follow direction 1  Write a sentence 1  Copy design 1  Total score 29       Normal Cognitive Function Screening today: Yes   EXAM (physical exam is not indicated for this visit type) Today's Vitals   05/03/20 0909  BP: 116/77  Pulse: 66  Temp: (!) 97.3 F (36.3 C)  TempSrc: Temporal  SpO2: 96%  Weight: 161 lb 12.8 oz (73.4 kg)  Height: 5\' 1"  (1.549 m)   Body mass index is 30.57 kg/m.  Visual Acuity Screen:   Hearing Screening   125Hz  250Hz  500Hz  1000Hz   2000Hz  3000Hz  4000Hz  6000Hz  8000Hz   Right ear:           Left ear:           Vision Screening Comments: [facility] [place] [facility] . Pt gets routine eye care through optometrist/ophthalmologist and is up to date with follow up care with this provider. No  additional physical exam required or indicated today.  Health Maintenance Health Maintenance  Topic Date Due  . INFLUENZA VACCINE  05/01/2020  . COVID-19 Vaccine (1) 05/19/2020 (Originally [birth year]/03/1973)  . HIV Screening  06/24/2020 (Originally 12/03/1976)  . MAMMOGRAM  03/11/2021  . TETANUS/TDAP  11/12/2023  . COLONOSCOPY  09/08/2029  . Hepatitis C Screening  Completed     END OF LIFE PLANNING Advanced directives and power of attorney information specific to the patient were discussed.   Advanced Directives 11/23/2018 12/22/2015 07/17/2013  Does Patient Have a Medical Advance Directive? No No Patient does not have advance directive;Patient would not like information  Would patient like information on creating a medical advance directive? No - Patient declined No - patient declined information -     Education, counseling, and referrals based on the information obtained/reviewed today: None  Education, counseling, and referral for other preventive services: Written checklist was completed and given to pt for obtaining, as appropriate, the other preventive services that are covered as separate Medicare Part B benefits. Possible services that were reviewed with pt are:  -annual wellness visit (AWV) -Cardiovascular screening blood tests -Colorectal cancer screening -Counseling to prevent tobacco use -Diabetes screening tests -Diabetes self-management training (DSMT) -Glaucoma screening -HIV screening -Medical nutrition therapy -Seasonal influenza, pneumococcal, and Hep B vaccines -screening mammography -screening pap tests and pelvic exam  Of the above listed services, no referrals were made  today.  Patient did not have an additional complaint/problem that was discussed and evaluated today.  Patient was given opportunity to ask any additional questions regarding Medicare and covered benefits.  Patient was informed that Medicare does not provide coverage for routine physical exams.  I answered all questions to the best of my ability today.    Follow up: Return as scheduled.  Julia Boston, MSN, APRN, FNP-C [facility] 05/03/20

## 2020-05-03 NOTE — Patient Instructions (Signed)
Preventive Care 56-58 Years Old, Female Preventive care refers to visits with your health care provider and lifestyle choices that can promote health and wellness. This includes:  A yearly physical exam. This may also be called an annual well check.  Regular dental visits and eye exams.  Immunizations.  Screening for certain conditions.  Healthy lifestyle choices, such as eating a healthy diet, getting regular exercise, not using drugs or products that contain nicotine and tobacco, and limiting alcohol use. What can I expect for my preventive care visit? Physical exam Your health care provider will check your:  Height and weight. This may be used to calculate body mass index (BMI), which tells if you are at a healthy weight.  Heart rate and blood pressure.  Skin for abnormal spots. Counseling Your health care provider may ask you questions about your:  Alcohol, tobacco, and drug use.  Emotional well-being.  Home and relationship well-being.  Sexual activity.  Eating habits.  Work and work Statistician.  Method of birth control.  Menstrual cycle.  Pregnancy history. What immunizations do I need?  Influenza (flu) vaccine  This is recommended every year. Tetanus, diphtheria, and pertussis (Tdap) vaccine  You may need a Td booster every 10 years. Varicella (chickenpox) vaccine  You may need this if you have not been vaccinated. Zoster (shingles) vaccine  You may need this after age 46. Measles, mumps, and rubella (MMR) vaccine  You may need at least one dose of MMR if you were born in 1957 or later. You may also need a second dose. Pneumococcal conjugate (PCV13) vaccine  You may need this if you have certain conditions and were not previously vaccinated. Pneumococcal polysaccharide (PPSV23) vaccine  You may need one or two doses if you smoke cigarettes or if you have certain conditions. Meningococcal conjugate (MenACWY) vaccine  You may need this if  you have certain conditions. Hepatitis A vaccine  You may need this if you have certain conditions or if you travel or work in places where you may be exposed to hepatitis A. Hepatitis B vaccine  You may need this if you have certain conditions or if you travel or work in places where you may be exposed to hepatitis B. Haemophilus influenzae type b (Hib) vaccine  You may need this if you have certain conditions. Human papillomavirus (HPV) vaccine  If recommended by your health care provider, you may need three doses over 6 months. You may receive vaccines as individual doses or as more than one vaccine together in one shot (combination vaccines). Talk with your health care provider about the risks and benefits of combination vaccines. What tests do I need? Blood tests  Lipid and cholesterol levels. These may be checked every 5 years, or more frequently if you are over 62 years old.  Hepatitis C test.  Hepatitis B test. Screening  Lung cancer screening. You may have this screening every year starting at age 8 if you have a 30-pack-year history of smoking and currently smoke or have quit within the past 15 years.  Colorectal cancer screening. All adults should have this screening starting at age 30 and continuing until age 97. Your health care provider may recommend screening at age 66 if you are at increased risk. You will have tests every 1-10 years, depending on your results and the type of screening test.  Diabetes screening. This is done by checking your blood sugar (glucose) after you have not eaten for a while (fasting). You may have  this done every 1-3 years.  Mammogram. This may be done every 1-2 years. Talk with your health care provider about when you should start having regular mammograms. This may depend on whether you have a family history of breast cancer.  BRCA-related cancer screening. This may be done if you have a family history of breast, ovarian, tubal, or  peritoneal cancers.  Pelvic exam and Pap test. This may be done every 3 years starting at age 21. Starting at age 30, this may be done every 5 years if you have a Pap test in combination with an HPV test. Other tests  Sexually transmitted disease (STD) testing.  Bone density scan. This is done to screen for osteoporosis. You may have this scan if you are at high risk for osteoporosis. Follow these instructions at home: Eating and drinking  Eat a diet that includes fresh fruits and vegetables, whole grains, lean protein, and low-fat dairy.  Take vitamin and mineral supplements as recommended by your health care provider.  Do not drink alcohol if: ? Your health care provider tells you not to drink. ? You are pregnant, may be pregnant, or are planning to become pregnant.  If you drink alcohol: ? Limit how much you have to 0-1 drink a day. ? Be aware of how much alcohol is in your drink. In the [place] one drink equals one 12 oz bottle of beer (355 mL), one 5 oz glass of wine (148 mL), or one 1 oz glass of hard liquor (44 mL). Lifestyle  Take daily care of your teeth and gums.  Stay active. Exercise for at least 30 minutes on 5 or more days each week.  Do not use any products that contain nicotine or tobacco, such as cigarettes, e-cigarettes, and chewing tobacco. If you need help quitting, ask your health care provider.  If you are sexually active, practice safe sex. Use a condom or other form of birth control (contraception) in order to prevent pregnancy and STIs (sexually transmitted infections).  If told by your health care provider, take low-dose aspirin daily starting at age 50. What's next?  Visit your health care provider once a year for a well check visit.  Ask your health care provider how often you should have your eyes and teeth checked.  Stay up to date on all vaccines. This information is not intended to replace advice given to you by your health care provider. Make  sure you discuss any questions you have with your health care provider. Document Revised: 05/29/2018 Document Reviewed: 05/29/2018 Elsevier Patient Education  2020 Elsevier Inc.   

## 2020-05-11 ENCOUNTER — Other Ambulatory Visit: Payer: Self-pay

## 2020-05-11 ENCOUNTER — Encounter: Payer: Self-pay | Admitting: Family Medicine

## 2020-05-11 ENCOUNTER — Ambulatory Visit (INDEPENDENT_AMBULATORY_CARE_PROVIDER_SITE_OTHER): Payer: Medicare HMO | Admitting: Family Medicine

## 2020-05-11 VITALS — BP 104/76 | HR 80 | Temp 97.6°F | Resp 22 | Ht 61.0 in | Wt 161.0 lb

## 2020-05-11 DIAGNOSIS — Z20822 Contact with and (suspected) exposure to covid-19: Secondary | ICD-10-CM | POA: Diagnosis not present

## 2020-05-11 DIAGNOSIS — J029 Acute pharyngitis, unspecified: Secondary | ICD-10-CM

## 2020-05-11 DIAGNOSIS — Z1152 Encounter for screening for COVID-19: Secondary | ICD-10-CM | POA: Diagnosis not present

## 2020-05-11 LAB — CULTURE, GROUP A STREP

## 2020-05-11 LAB — RAPID STREP SCREEN (MED CTR MEBANE ONLY): Strep Gp A Ag, IA W/Reflex: NEGATIVE

## 2020-05-11 MED ORDER — BENZONATATE 100 MG PO CAPS: 100.0000 mg | ORAL_CAPSULE | Freq: Three times a day (TID) | ORAL | 0 refills | Status: DC | PRN

## 2020-05-11 MED ORDER — DEXAMETHASONE 6 MG PO TABS: 6.0000 mg | ORAL_TABLET | Freq: Every day | ORAL | 0 refills | Status: DC

## 2020-05-11 NOTE — Progress Notes (Signed)
Assessment & Plan:  1. Suspected COVID-19 virus infection - Patient to quarantine at home. Discussed symptom management. Started on dexamethasone and benzonatate. Continue Tylenol for pain/fever.  - benzonatate (TESSALON PERLES) 100 MG capsule; Take 1 capsule (100 mg total) by mouth 3 (three) times daily as needed for cough.  Dispense: 30 capsule; Refill: 0 - dexamethasone (DECADRON) 6 MG tablet; Take 1 tablet (6 mg total) by mouth daily. (Patient taking differently: Take 6 mg by mouth daily. For 7 Days)  Dispense: 7 tablet; Refill: 0  2. Sore throat - Rapid Strep Screen ([facility] ONLY): negative  3. Encounter for screening for COVID-19 - Novel Coronavirus, NAA ([facility])   Follow up plan: Return if symptoms worsen or fail to improve.  [name], MSN, APRN, FNP-C [facility]  Subjective:   Patient ID: Julia [name], female    DOB: 1961-09-26, 59 y.o.   MRN: [id number]  HPI: Julia [name] is a 59 y.o. female presenting on 05/11/2020 for Cough (loss of taste, achey all over ) and Fever (chills, HA , ST )  Patient complains of cough, headache, fever, loss of taste and body aches. Onset of symptoms was 2 days ago, gradually worsening since that time. She is drinking plenty of fluids. Evaluation to date: none. Treatment to date: Tylenol every 6 hours.  She does not smoke. Patient has had recent close contact with someone who has tested positive for COVID-19 as her husband's test results came back positive yesterday. She has not been vaccinated against COVID-19.    ROS: Negative unless specifically indicated above in HPI.   Relevant past medical history reviewed and updated as indicated.   Allergies and medications reviewed and updated.   Current Outpatient Medications:  .  atorvastatin (LIPITOR) 20 MG tablet, Take 1 tablet (20 mg total) by mouth every evening., Disp: 90 tablet, Rfl: 3 .  buPROPion (WELLBUTRIN XL) 150 MG 24 hr tablet, Take 300 mg  by mouth daily. , Disp: , Rfl:  .  gabapentin (NEURONTIN) 300 MG capsule, Take 300 mg by mouth 2 (two) times daily., Disp: , Rfl:  .  meloxicam (MOBIC) 15 MG tablet, Take 1 tablet (15 mg total) by mouth daily as needed for pain. TAKE 1 Tablet BY MOUTH ONCE DAILY AS NEEDED FOR PAIN, Disp: 90 tablet, Rfl: 3 .  omeprazole (PRILOSEC) 40 MG capsule, Take 1 capsule (40 mg total) by mouth daily., Disp: 90 capsule, Rfl: 3 .  Probiotic Product (PROBIOTIC DAILY PO), Take 1 tablet by mouth daily., Disp: , Rfl:  .  sertraline (ZOLOFT) 100 MG tablet, Take 200 mg by mouth daily. , Disp: , Rfl:  .  traZODone (DESYREL) 50 MG tablet, Take 50 mg by mouth at bedtime., Disp: , Rfl:   Allergies  Allergen Reactions  . Lamictal [Lamotrigine] Itching and Swelling    Objective:   LMP 01/02/2010    Physical Exam Vitals reviewed.  Constitutional:      General: She is not in acute distress.    Appearance: Normal appearance. She is not ill-appearing, toxic-appearing or diaphoretic.  HENT:     Head: Normocephalic and atraumatic.     Right Ear: Tympanic membrane, ear canal and external ear normal. There is no impacted cerumen.     Left Ear: Tympanic membrane, ear canal and external ear normal. There is no impacted cerumen.     Mouth/Throat:     Mouth: Mucous membranes are moist.     Pharynx: Oropharynx is clear.  No oropharyngeal exudate or posterior oropharyngeal erythema.  Eyes:     General: No scleral icterus.       Right eye: No discharge.        Left eye: No discharge.     Conjunctiva/sclera: Conjunctivae normal.  Cardiovascular:     Rate and Rhythm: Normal rate and regular rhythm.     Heart sounds: Normal heart sounds. No murmur heard.  No friction rub. No gallop.   Pulmonary:     Effort: Pulmonary effort is normal. No respiratory distress.     Breath sounds: Normal breath sounds. No stridor. No wheezing, rhonchi or rales.  Musculoskeletal:        General: Normal range of motion.     Cervical back:  Normal range of motion.  Lymphadenopathy:     Cervical: No cervical adenopathy.  Skin:    General: Skin is warm and dry.     Capillary Refill: Capillary refill takes less than 2 seconds.  Neurological:     General: No focal deficit present.     Mental Status: She is alert and oriented to person, place, and time. Mental status is at baseline.  Psychiatric:        Mood and Affect: Mood normal.        Behavior: Behavior normal.        Thought Content: Thought content normal.        Judgment: Judgment normal.

## 2020-05-12 LAB — SARS-COV-2, NAA 2 DAY TAT

## 2020-05-12 LAB — NOVEL CORONAVIRUS, NAA: SARS-CoV-2, NAA: DETECTED — AB

## 2020-05-19 ENCOUNTER — Other Ambulatory Visit: Payer: Self-pay

## 2020-05-19 ENCOUNTER — Inpatient Hospital Stay (HOSPITAL_COMMUNITY)
Admission: EM | Admit: 2020-05-19 | Discharge: 2020-05-29 | DRG: 177 | Disposition: A | Discharge status: Home / Self Care | Payer: Medicare HMO | Attending: Family Medicine | Admitting: Family Medicine

## 2020-05-19 ENCOUNTER — Emergency Department (HOSPITAL_COMMUNITY): Payer: Medicare HMO

## 2020-05-19 DIAGNOSIS — I517 Cardiomegaly: Secondary | ICD-10-CM | POA: Diagnosis not present

## 2020-05-19 DIAGNOSIS — U071 COVID-19: Secondary | ICD-10-CM | POA: Diagnosis not present

## 2020-05-19 DIAGNOSIS — R064 Hyperventilation: Secondary | ICD-10-CM | POA: Diagnosis not present

## 2020-05-19 DIAGNOSIS — Z981 Arthrodesis status: Secondary | ICD-10-CM

## 2020-05-19 DIAGNOSIS — R9431 Abnormal electrocardiogram [ECG] [EKG]: Secondary | ICD-10-CM | POA: Diagnosis not present

## 2020-05-19 DIAGNOSIS — F419 Anxiety disorder, unspecified: Secondary | ICD-10-CM | POA: Diagnosis present

## 2020-05-19 DIAGNOSIS — Z888 Allergy status to other drugs, medicaments and biological substances status: Secondary | ICD-10-CM

## 2020-05-19 DIAGNOSIS — F329 Major depressive disorder, single episode, unspecified: Secondary | ICD-10-CM | POA: Diagnosis present

## 2020-05-19 DIAGNOSIS — M889 Osteitis deformans of unspecified bone: Secondary | ICD-10-CM | POA: Diagnosis present

## 2020-05-19 DIAGNOSIS — J1282 Pneumonia due to coronavirus disease 2019: Secondary | ICD-10-CM | POA: Diagnosis present

## 2020-05-19 DIAGNOSIS — Z87891 Personal history of nicotine dependence: Secondary | ICD-10-CM

## 2020-05-19 DIAGNOSIS — R7303 Prediabetes: Secondary | ICD-10-CM | POA: Diagnosis present

## 2020-05-19 DIAGNOSIS — R0789 Other chest pain: Secondary | ICD-10-CM | POA: Diagnosis not present

## 2020-05-19 DIAGNOSIS — R0902 Hypoxemia: Secondary | ICD-10-CM | POA: Diagnosis not present

## 2020-05-19 DIAGNOSIS — F32A Depression, unspecified: Secondary | ICD-10-CM | POA: Diagnosis present

## 2020-05-19 DIAGNOSIS — Z79899 Other long term (current) drug therapy: Secondary | ICD-10-CM

## 2020-05-19 DIAGNOSIS — Z8349 Family history of other endocrine, nutritional and metabolic diseases: Secondary | ICD-10-CM

## 2020-05-19 DIAGNOSIS — J189 Pneumonia, unspecified organism: Secondary | ICD-10-CM | POA: Diagnosis not present

## 2020-05-19 DIAGNOSIS — J9601 Acute respiratory failure with hypoxia: Secondary | ICD-10-CM | POA: Diagnosis not present

## 2020-05-19 DIAGNOSIS — K219 Gastro-esophageal reflux disease without esophagitis: Secondary | ICD-10-CM | POA: Diagnosis present

## 2020-05-19 DIAGNOSIS — E785 Hyperlipidemia, unspecified: Secondary | ICD-10-CM | POA: Diagnosis present

## 2020-05-19 DIAGNOSIS — M352 Behcet's disease: Secondary | ICD-10-CM | POA: Diagnosis present

## 2020-05-19 DIAGNOSIS — E782 Mixed hyperlipidemia: Secondary | ICD-10-CM | POA: Diagnosis present

## 2020-05-19 DIAGNOSIS — G4489 Other headache syndrome: Secondary | ICD-10-CM | POA: Diagnosis not present

## 2020-05-19 DIAGNOSIS — R0602 Shortness of breath: Secondary | ICD-10-CM | POA: Diagnosis not present

## 2020-05-19 DIAGNOSIS — F411 Generalized anxiety disorder: Secondary | ICD-10-CM | POA: Diagnosis present

## 2020-05-19 DIAGNOSIS — Z8249 Family history of ischemic heart disease and other diseases of the circulatory system: Secondary | ICD-10-CM

## 2020-05-19 DIAGNOSIS — Z791 Long term (current) use of non-steroidal anti-inflammatories (NSAID): Secondary | ICD-10-CM

## 2020-05-19 DIAGNOSIS — R079 Chest pain, unspecified: Secondary | ICD-10-CM | POA: Diagnosis not present

## 2020-05-19 LAB — BASIC METABOLIC PANEL
Anion gap: 10 (ref 5–15)
BUN: 16 mg/dL (ref 6–20)
CO2: 24 mmol/L (ref 22–32)
Calcium: 8.8 mg/dL — ABNORMAL LOW (ref 8.9–10.3)
Chloride: 102 mmol/L (ref 98–111)
Creatinine, Ser: 0.89 mg/dL (ref 0.44–1.00)
GFR calc Af Amer: >60 mL/min (ref >60)
GFR calc non Af Amer: >60 mL/min (ref >60)
Glucose, Bld: 101 mg/dL — ABNORMAL HIGH (ref 70–99)
Potassium: 3.7 mmol/L (ref 3.5–5.1)
Sodium: 136 mmol/L (ref 135–145)

## 2020-05-19 LAB — CBC
HCT: 40.7 % (ref 36.0–46.0)
Hemoglobin: 12.7 g/dL (ref 12.0–15.0)
MCH: 29.6 pg (ref 26.0–34.0)
MCHC: 31.2 g/dL (ref 30.0–36.0)
MCV: 94.9 fL (ref 80.0–100.0)
Platelets: 251 10*3/uL (ref 150–400)
RBC: 4.29 MIL/uL (ref 3.87–5.11)
RDW: 13.4 % (ref 11.5–15.5)
WBC: 5.7 10*3/uL (ref 4.0–10.5)
nRBC: 0 % (ref 0.0–0.2)

## 2020-05-19 NOTE — ED Triage Notes (Signed)
Pt here via EMS from urgent care for further eval of 85% SpO2 on room air. Dx with covid on 8/11. C/o shob and generalized body aches.

## 2020-05-20 DIAGNOSIS — J9601 Acute respiratory failure with hypoxia: Secondary | ICD-10-CM

## 2020-05-20 DIAGNOSIS — F411 Generalized anxiety disorder: Secondary | ICD-10-CM | POA: Diagnosis present

## 2020-05-20 DIAGNOSIS — K219 Gastro-esophageal reflux disease without esophagitis: Secondary | ICD-10-CM | POA: Diagnosis not present

## 2020-05-20 DIAGNOSIS — E785 Hyperlipidemia, unspecified: Secondary | ICD-10-CM | POA: Diagnosis not present

## 2020-05-20 DIAGNOSIS — F329 Major depressive disorder, single episode, unspecified: Secondary | ICD-10-CM

## 2020-05-20 DIAGNOSIS — R0602 Shortness of breath: Secondary | ICD-10-CM | POA: Diagnosis not present

## 2020-05-20 DIAGNOSIS — R69 Illness, unspecified: Secondary | ICD-10-CM | POA: Diagnosis not present

## 2020-05-20 DIAGNOSIS — R7303 Prediabetes: Secondary | ICD-10-CM | POA: Diagnosis not present

## 2020-05-20 DIAGNOSIS — Z981 Arthrodesis status: Secondary | ICD-10-CM | POA: Diagnosis not present

## 2020-05-20 DIAGNOSIS — F419 Anxiety disorder, unspecified: Secondary | ICD-10-CM

## 2020-05-20 DIAGNOSIS — J1282 Pneumonia due to coronavirus disease 2019: Secondary | ICD-10-CM

## 2020-05-20 DIAGNOSIS — Z87891 Personal history of nicotine dependence: Secondary | ICD-10-CM | POA: Diagnosis not present

## 2020-05-20 DIAGNOSIS — M889 Osteitis deformans of unspecified bone: Secondary | ICD-10-CM | POA: Diagnosis not present

## 2020-05-20 DIAGNOSIS — U071 COVID-19: Principal | ICD-10-CM

## 2020-05-20 DIAGNOSIS — Z8349 Family history of other endocrine, nutritional and metabolic diseases: Secondary | ICD-10-CM | POA: Diagnosis not present

## 2020-05-20 DIAGNOSIS — Z8249 Family history of ischemic heart disease and other diseases of the circulatory system: Secondary | ICD-10-CM | POA: Diagnosis not present

## 2020-05-20 DIAGNOSIS — Z888 Allergy status to other drugs, medicaments and biological substances status: Secondary | ICD-10-CM | POA: Diagnosis not present

## 2020-05-20 DIAGNOSIS — Z791 Long term (current) use of non-steroidal anti-inflammatories (NSAID): Secondary | ICD-10-CM | POA: Diagnosis not present

## 2020-05-20 DIAGNOSIS — M352 Behcet's disease: Secondary | ICD-10-CM | POA: Diagnosis not present

## 2020-05-20 DIAGNOSIS — Z79899 Other long term (current) drug therapy: Secondary | ICD-10-CM | POA: Diagnosis not present

## 2020-05-20 HISTORY — DX: COVID-19: U07.1

## 2020-05-20 HISTORY — DX: Pneumonia due to coronavirus disease 2019: J12.82

## 2020-05-20 LAB — LACTATE DEHYDROGENASE: LDH: 344 U/L — ABNORMAL HIGH (ref 98–192)

## 2020-05-20 LAB — TROPONIN I (HIGH SENSITIVITY)
Troponin I (High Sensitivity): 5 ng/L (ref <18)
Troponin I (High Sensitivity): 4 ng/L (ref <18)

## 2020-05-20 LAB — TRIGLYCERIDES: Triglycerides: 121 mg/dL (ref <150)

## 2020-05-20 LAB — C-REACTIVE PROTEIN: CRP: 8.9 mg/dL — ABNORMAL HIGH (ref <1.0)

## 2020-05-20 LAB — HIV ANTIBODY (ROUTINE TESTING W REFLEX): HIV Screen 4th Generation wRfx: NONREACTIVE

## 2020-05-20 LAB — LACTIC ACID, PLASMA: Lactic Acid, Venous: 1 mmol/L (ref 0.5–1.9)

## 2020-05-20 LAB — D-DIMER, QUANTITATIVE: D-Dimer, Quant: 0.82 ug/mL-FEU — ABNORMAL HIGH (ref 0.00–0.50)

## 2020-05-20 LAB — PROCALCITONIN: Procalcitonin: <0.1 ng/mL

## 2020-05-20 LAB — FERRITIN: Ferritin: 205 ng/mL (ref 11–307)

## 2020-05-20 LAB — ABO/RH: ABO/RH(D): A POS

## 2020-05-20 LAB — FIBRINOGEN: Fibrinogen: >800 mg/dL — ABNORMAL HIGH (ref 210–475)

## 2020-05-20 MED ORDER — ZINC SULFATE 220 (50 ZN) MG PO CAPS
220.0000 mg | ORAL_CAPSULE | Freq: Every day | ORAL | Status: DC
  Administered 2020-05-20 – 2020-05-29 (×10): 220 mg via ORAL
  Filled 2020-05-20 (×10): qty 1

## 2020-05-20 MED ORDER — HYDROCOD POLST-CPM POLST ER 10-8 MG/5ML PO SUER
5.0000 mL | Freq: Two times a day (BID) | ORAL | Status: DC | PRN
  Administered 2020-05-25: 5 mL via ORAL
  Filled 2020-05-20 (×2): qty 5

## 2020-05-20 MED ORDER — SERTRALINE HCL 100 MG PO TABS
200.0000 mg | ORAL_TABLET | Freq: Every day | ORAL | Status: DC
  Administered 2020-05-20 – 2020-05-29 (×10): 200 mg via ORAL
  Filled 2020-05-20 (×12): qty 2

## 2020-05-20 MED ORDER — ALBUTEROL SULFATE HFA 108 (90 BASE) MCG/ACT IN AERS
2.0000 | INHALATION_SPRAY | Freq: Four times a day (QID) | RESPIRATORY_TRACT | Status: DC
  Administered 2020-05-20 – 2020-05-29 (×35): 2 via RESPIRATORY_TRACT
  Filled 2020-05-20 (×3): qty 6.7

## 2020-05-20 MED ORDER — TRAZODONE HCL 50 MG PO TABS
50.0000 mg | ORAL_TABLET | Freq: Every day | ORAL | Status: DC
  Administered 2020-05-20: 50 mg via ORAL
  Filled 2020-05-20 (×7): qty 1

## 2020-05-20 MED ORDER — ONDANSETRON HCL 4 MG PO TABS
4.0000 mg | ORAL_TABLET | Freq: Four times a day (QID) | ORAL | Status: DC | PRN
  Administered 2020-05-23 – 2020-05-24 (×3): 4 mg via ORAL
  Filled 2020-05-20 (×3): qty 1

## 2020-05-20 MED ORDER — ONDANSETRON HCL 4 MG/2ML IJ SOLN: 4.0000 mg | Freq: Four times a day (QID) | INTRAMUSCULAR | Status: DC | PRN

## 2020-05-20 MED ORDER — SODIUM CHLORIDE 0.9 % IV SOLN
200.0000 mg | Freq: Once | INTRAVENOUS | Status: AC
  Administered 2020-05-20: 200 mg via INTRAVENOUS
  Filled 2020-05-20: qty 200

## 2020-05-20 MED ORDER — DEXAMETHASONE SODIUM PHOSPHATE 10 MG/ML IJ SOLN
6.0000 mg | Freq: Once | INTRAMUSCULAR | Status: AC
  Administered 2020-05-20: 6 mg via INTRAVENOUS
  Filled 2020-05-20: qty 1

## 2020-05-20 MED ORDER — GABAPENTIN 300 MG PO CAPS
300.0000 mg | ORAL_CAPSULE | Freq: Two times a day (BID) | ORAL | Status: DC
  Administered 2020-05-20 – 2020-05-29 (×19): 300 mg via ORAL
  Filled 2020-05-20 (×19): qty 1

## 2020-05-20 MED ORDER — ATORVASTATIN CALCIUM 10 MG PO TABS
20.0000 mg | ORAL_TABLET | Freq: Every evening | ORAL | Status: DC
  Administered 2020-05-21 – 2020-05-28 (×8): 20 mg via ORAL
  Filled 2020-05-20 (×8): qty 2

## 2020-05-20 MED ORDER — GUAIFENESIN-DM 100-10 MG/5ML PO SYRP
10.0000 mL | ORAL_SOLUTION | ORAL | Status: DC | PRN
  Administered 2020-05-21 – 2020-05-28 (×9): 10 mL via ORAL
  Filled 2020-05-20 (×10): qty 10

## 2020-05-20 MED ORDER — BUPROPION HCL ER (XL) 150 MG PO TB24
300.0000 mg | ORAL_TABLET | Freq: Every day | ORAL | Status: DC
  Administered 2020-05-20 – 2020-05-29 (×10): 300 mg via ORAL
  Filled 2020-05-20 (×10): qty 2

## 2020-05-20 MED ORDER — SODIUM CHLORIDE 0.9 % IV SOLN
100.0000 mg | Freq: Every day | INTRAVENOUS | Status: AC
  Administered 2020-05-21 – 2020-05-24 (×4): 100 mg via INTRAVENOUS
  Filled 2020-05-20 (×4): qty 20

## 2020-05-20 MED ORDER — ACETAMINOPHEN 325 MG PO TABS
650.0000 mg | ORAL_TABLET | Freq: Four times a day (QID) | ORAL | Status: DC | PRN
  Administered 2020-05-20 – 2020-05-24 (×7): 650 mg via ORAL
  Filled 2020-05-20 (×7): qty 2

## 2020-05-20 MED ORDER — PANTOPRAZOLE SODIUM 40 MG PO TBEC
40.0000 mg | DELAYED_RELEASE_TABLET | Freq: Every day | ORAL | Status: DC
  Administered 2020-05-20 – 2020-05-29 (×10): 40 mg via ORAL
  Filled 2020-05-20 (×10): qty 1

## 2020-05-20 MED ORDER — ENOXAPARIN SODIUM 40 MG/0.4ML ~~LOC~~ SOLN
40.0000 mg | Freq: Every day | SUBCUTANEOUS | Status: DC
  Administered 2020-05-20 – 2020-05-29 (×10): 40 mg via SUBCUTANEOUS
  Filled 2020-05-20 (×10): qty 0.4

## 2020-05-20 MED ORDER — DEXAMETHASONE 4 MG PO TABS: 6.0000 mg | ORAL_TABLET | ORAL | Status: DC

## 2020-05-20 MED ORDER — ASCORBIC ACID 500 MG PO TABS
500.0000 mg | ORAL_TABLET | Freq: Every day | ORAL | Status: DC
  Administered 2020-05-20 – 2020-05-29 (×10): 500 mg via ORAL
  Filled 2020-05-20 (×10): qty 1

## 2020-05-20 NOTE — ED Notes (Signed)
RN placed pt on [place] (humidifed) O2 at 4L, SpO2 93%, RR 25.

## 2020-05-20 NOTE — ED Provider Notes (Signed)
[facility] EMERGENCY DEPARTMENT Provider Note   CSN: [id number] Arrival date & time: 05/19/20  1835     History Chief Complaint  Patient presents with  . Shortness of Breath    Julia [name] is a 59 y.o. female.  HPI  Patient presents to the ED via EMS from urgent care for shortness of breath.  The patient was there for evaluation and found to be hypoxic with SPO2 of 85% on room air.  No baseline home O2 requirement.  The patient was diagnosed on 8/11 with COVID19 and has progressively had more shortness of breath and worsening symptoms.  She complains of generalized body aches, arthralgias, myalgias and cough.  Mild chest discomfort mostly associated with her cough as well.  Sharp in nature.  History of anxiety.  No history of DVT.  Patient not on anticoagulation or antiplatelets.  No history of any chronic lung disease.  Patient reports her spouse at home is also sick.  Oxygenation improved with nasal cannula.      Past Medical History:  Diagnosis Date  . Anxiety   . Arthritis   . Complication of anesthesia    slow to wake up-does not take much medicine to sedate her  . Depression   . Esophageal reflux 08/15/2015  . Hyperlipidemia 08/15/2015  . Paget disease of bone   . Wears glasses     Patient Active Problem List   Diagnosis Date Noted  . Pneumonia due to COVID-19 virus 05/20/2020  . Acute respiratory failure with hypoxia (HCC) 05/20/2020  . Chronic pain 02/10/2016  . Paget disease of bone 12/28/2015  . Esophageal reflux 08/15/2015  . Hyperlipidemia 08/15/2015  . Chronic pain in shoulder 08/15/2015  . Anxiety and depression 11/25/2013  . GAD (generalized anxiety disorder) 11/25/2013  . IBS (irritable bowel syndrome) 01/02/2013    Past Surgical History:  Procedure Laterality Date  . ABDOMINAL HYSTERECTOMY  8/11   TAH  . ANTERIOR FUSION CERVICAL SPINE  10/2019   C3-C5  . CERVICAL SPINE SURGERY  1/13  . DILATION AND CURETTAGE OF UTERUS   1986  . KNEE ARTHROSCOPY Left 07/22/2013   Procedure: LEFT KNEE ARTHROSCOPY WITH DEBRIDEMENT CHONDROPLASTY, REMOVAL LOOSE BODIES, PARTIAL MEDIAL MENISCECTOMY, OPEN EXCISION OF PES GANGLION;  Surgeon: [name], MD;  Location: [facility];  Service: Orthopedics;  Laterality: Left;  NO SPECIMEN SENT PER DR. [name] ORDER.  . TUBAL LIGATION  1989     OB History   No obstetric history on file.     Family History  Problem Relation Age of Onset  . Thyroid disease Mother   . Congestive Heart Failure Mother   . Heart disease Mother   . Lung cancer Sister   . Melanoma Sister   . Healthy Brother   . Healthy Brother   . Healthy Brother   . Healthy Brother   . Heart disease Father     Social History   Tobacco Use  . Smoking status: Former Smoker    Quit date: 01/02/1985    Years since quitting: 35.4  . Smokeless tobacco: Never Used  Vaping Use  . Vaping Use: Never used  Substance Use Topics  . Alcohol use: Yes    Comment: occassional  . Drug use: No    Home Medications Prior to Admission medications   Medication Sig Start Date End Date Taking? Authorizing Provider  atorvastatin (LIPITOR) 20 MG tablet Take 1 tablet (20 mg total) by mouth every evening. 12/23/19  Gwenlyn Fudge, FNP  benzonatate (TESSALON PERLES) 100 MG capsule Take 1 capsule (100 mg total) by mouth 3 (three) times daily as needed for cough. 05/11/20   Gwenlyn Fudge, FNP  buPROPion (WELLBUTRIN XL) 300 MG 24 hr tablet Take 300 mg by mouth daily.     [provider]  dexamethasone (DECADRON) 6 MG tablet Take 1 tablet (6 mg total) by mouth daily. Patient taking differently: Take 6 mg by mouth daily. For 7 Days 05/11/20   Gwenlyn Fudge, FNP  gabapentin (NEURONTIN) 300 MG capsule Take 300 mg by mouth 2 (two) times daily. 03/24/20   [provider]  meloxicam (MOBIC) 15 MG tablet Take 1 tablet (15 mg total) by mouth daily as needed for pain. TAKE 1 Tablet BY MOUTH ONCE DAILY AS NEEDED  FOR PAIN 12/23/19   Gwenlyn Fudge, FNP  omeprazole (PRILOSEC) 40 MG capsule Take 1 capsule (40 mg total) by mouth daily. 12/23/19   Gwenlyn Fudge, FNP  Probiotic Product (PROBIOTIC DAILY PO) Take 1 tablet by mouth daily.    [provider]  sertraline (ZOLOFT) 100 MG tablet Take 200 mg by mouth daily.     [provider]  traZODone (DESYREL) 50 MG tablet Take 50 mg by mouth at bedtime.    [provider]    Allergies    Lamictal [lamotrigine]  Review of Systems   Review of Systems  Constitutional: Negative for chills and fever.  HENT: Negative for ear pain and sore throat.   Eyes: Negative for pain and visual disturbance.  Respiratory: Positive for cough, chest tightness and shortness of breath.   Cardiovascular: Negative for chest pain and palpitations.  Gastrointestinal: Negative for abdominal pain and vomiting.  Genitourinary: Negative for dysuria and hematuria.  Musculoskeletal: Positive for arthralgias and myalgias. Negative for back pain.  Skin: Negative for color change and rash.  Neurological: Negative for seizures and syncope.  All other systems reviewed and are negative.   Physical Exam Updated Vital Signs BP 117/75   Pulse 67   Temp 99.8 F (37.7 C) (Oral)   Resp (!) 28   Ht 5\' 1"  (1.549 m)   Wt 70.3 kg   LMP 01/02/2010   SpO2 93%   BMI 29.29 kg/m   Physical Exam Vitals and nursing note reviewed.  Constitutional:      General: She is not in acute distress.    Appearance: She is well-developed. She is ill-appearing. She is not toxic-appearing.  HENT:     Head: Normocephalic and atraumatic.  Eyes:     Extraocular Movements: Extraocular movements intact.     Conjunctiva/sclera: Conjunctivae normal.     Pupils: Pupils are equal, round, and reactive to light.  Cardiovascular:     Rate and Rhythm: Normal rate and regular rhythm.     Pulses: Normal pulses.     Heart sounds: No murmur heard.   Pulmonary:     Effort: Pulmonary  effort is normal. Tachypnea present. No respiratory distress.     Breath sounds: Rhonchi present.  Abdominal:     General: There is no distension.     Palpations: Abdomen is soft.     Tenderness: There is no abdominal tenderness.  Musculoskeletal:     Cervical back: Neck supple. No rigidity.     Right lower leg: No edema.     Left lower leg: No edema.  Skin:    General: Skin is warm and dry.     Capillary Refill:  Capillary refill takes less than 2 seconds.  Neurological:     General: No focal deficit present.     Mental Status: She is alert and oriented to person, place, and time. Mental status is at baseline.  Psychiatric:        Mood and Affect: Mood normal.        Behavior: Behavior normal.     ED Results / Procedures / Treatments   Labs (all labs ordered are listed, but only abnormal results are displayed) Labs Reviewed  BASIC METABOLIC PANEL - Abnormal; Notable for the following components:      Result Value   Glucose, Bld 101 (*)    Calcium 8.8 (*)    All other components within normal limits  D-DIMER, QUANTITATIVE (NOT AT [facility]) - Abnormal; Notable for the following components:   D-Dimer, Quant 0.82 (*)    All other components within normal limits  LACTATE DEHYDROGENASE - Abnormal; Notable for the following components:   LDH 344 (*)    All other components within normal limits  FIBRINOGEN - Abnormal; Notable for the following components:   Fibrinogen >800 (*)    All other components within normal limits  C-REACTIVE PROTEIN - Abnormal; Notable for the following components:   CRP 8.9 (*)    All other components within normal limits  CULTURE, BLOOD (ROUTINE X 2)  CULTURE, BLOOD (ROUTINE X 2)  CBC  LACTIC ACID, PLASMA  PROCALCITONIN  FERRITIN  TRIGLYCERIDES  HIV ANTIBODY (ROUTINE TESTING W REFLEX)  ABO/RH  TROPONIN I (HIGH SENSITIVITY)  TROPONIN I (HIGH SENSITIVITY)    EKG None  Radiology DG Chest Portable 1 View  Result Date: 05/19/2020 CLINICAL DATA:   Hypoxia. COVID positive. Shortness of breath body aches EXAM: PORTABLE CHEST 1 VIEW COMPARISON:  12/22/2015 FINDINGS: Lung volumes are low. Patchy bilateral heterogeneous lung opacities in a peripheral and basilar predominant distribution. Mild cardiomegaly with normal mediastinal contours. No pleural fluid or pneumothorax. No acute osseous abnormalities are seen. IMPRESSION: 1. Low lung volumes. Patchy bilateral heterogeneous lung opacities in a peripheral and basilar predominant distribution, pattern typical of COVID-19 pneumonia. 2. Mild cardiomegaly. Electronically Signed   By: Narda Rutherford M.D.   On: 05/19/2020 22:37    Procedures Procedures (including critical care time)  Medications Ordered in ED Medications  enoxaparin (LOVENOX) injection 40 mg (40 mg Subcutaneous Given 05/20/20 1042)  remdesivir 200 mg in sodium chloride 0.9% 250 mL IVPB ( Intravenous Stopped 05/20/20 1139)    Followed by  remdesivir 100 mg in sodium chloride 0.9 % 100 mL IVPB (has no administration in time range)  albuterol (VENTOLIN HFA) 108 (90 Base) MCG/ACT inhaler 2 puff (2 puffs Inhalation Given 05/20/20 1042)  guaiFENesin-dextromethorphan (ROBITUSSIN DM) 100-10 MG/5ML syrup 10 mL (has no administration in time range)  chlorpheniramine-HYDROcodone (TUSSIONEX) 10-8 MG/5ML suspension 5 mL (has no administration in time range)  ascorbic acid (VITAMIN C) tablet 500 mg (500 mg Oral Given 05/20/20 1045)  zinc sulfate capsule 220 mg (220 mg Oral Given 05/20/20 1045)  dexamethasone (DECADRON) tablet 6 mg (has no administration in time range)  acetaminophen (TYLENOL) tablet 650 mg (has no administration in time range)  ondansetron (ZOFRAN) tablet 4 mg (has no administration in time range)    Or  ondansetron (ZOFRAN) injection 4 mg (has no administration in time range)  atorvastatin (LIPITOR) tablet 20 mg (has no administration in time range)  sertraline (ZOLOFT) tablet 200 mg (has no administration in time range)    traZODone (DESYREL) tablet 50  mg (has no administration in time range)  pantoprazole (PROTONIX) EC tablet 40 mg (has no administration in time range)  buPROPion (WELLBUTRIN XL) 24 hr tablet 300 mg (has no administration in time range)  gabapentin (NEURONTIN) capsule 300 mg (has no administration in time range)  dexamethasone (DECADRON) injection 6 mg (6 mg Intravenous Given 05/20/20 0854)    ED Course   Vanessa KickJanis M Defrank is a 59 y.o. female with PMHx listed that presents to the Emergency Department complaint of Shortness of Breath  ED Course: Initial exam completed.   Ill-appearing but hemodynamically stable.  Nontoxic.  Afebrile.  Physical exam significant for age-appropriate 59 year old female with diminished breath sounds bilaterally with mild rhonchi, extremities well perfused, abdomen soft, nondistended, nonfocally tender, and mild reproducible chest pain.  Initial differential includes viral pneumonia, secondary bacterial pneumonia, pulmonary embolism, hypoxemia, electrolyte abnormalities, and dehydration.   Triage work-up reviewed.  CBC without evidence of leukocytosis or leukopenia stable hemoglobin.  BMP with no evidence of acute electrolyte normality crying urgent intervention.  CXR with low lung volumes and peripheral/basilar distribution of lung opacities consistent with a COVID-19 pneumonia.  During my evaluation, the patient was titrated off oxygen and quickly dropped to 84% on room air; nasal cannula is replaced and oxygenation improved.  EKG sinus rhythm at rate 83 without acute ST changes; normal intervals.  Given the patient's acute hypoxic respiratory failure, she will require inpatient management for further treatment.  IV Decadron along with COVID-19 admission order set initiated.  Discussed with hospitalist who agreed to admit the patient to their service for further inpatient management.    Diagnostics Vital Signs: reviewed Labs: reviewed and significant findings discussed  above Imaging: personally reviewed images interpreted by radiology EKG: reviewed Records: nursing notes along with previous records reviewed and pertinent data discussed   Consults:  Hospitalist   Reevaluation/Disposition:  Upon reevaluation, patients symptoms stable.   All questions answered.  Patient and/or family was understanding and in agreement with today's assessment and plan.   Campbell RichesAlex Shreyas Piatkowski, MD Emergency Medicine, PGY-3   Note: Dragon medical dictation software was used in the creation of this note.   Final Clinical Impression(s) / ED Diagnoses Final diagnoses:  COVID-19  Acute respiratory failure with hypoxia [facility](HCC)    Rx / DC Orders ED Discharge Orders    None       Nino ParsleyGross, Scotti Kosta, MD 05/20/20 1523    Rolan BuccoBelfi, Melanie, MD 05/20/20 1531

## 2020-05-20 NOTE — ED Notes (Signed)
RN found pt in bed, SpO2 82% on 4L via [place]. Pt stated she was trying to eat her dinner but got too short of breath. RN increased O2 to 6L, SpO2 89% now.

## 2020-05-20 NOTE — H&P (Addendum)
History and Physical    Julia KickJanis M Noh RUE:[id number] DOB: 09/16/1961 DOA: 05/19/2020  Referring MD/NP/PA: Lonia MadJeffrey gross, MD (resident) PCP: Gwenlyn FudgeJoyce, Britney F, FNP  Patient coming from: Urgent care  Chief Complaint: Shortness of breath  I have personally briefly reviewed patient's old medical records in [facility]   HPI: Julia [name] is a 59 y.o. female with medical history significant of Paget's disease, hyperlipidemia, anxiety, depression, and GERD presents with complaints of shortness of breath and cough for the last 10 days. Patient initially found out that she was COVID-19 positive on 8/11.  Symptoms include intermittent fevers up to 100.4 F, generalized malaise, headache, myalgias, loss of taste, poor appetite, nausea with dry heaves, and generalized weakness.  Her primary care provider had given her a prescription for Decadron and cough medicine which she used a couple days, but had not been able to keep anything down recently.  Patient is not normally on oxygen at baseline.   Her and her husband both have been having symptoms.  Patient did not receive her COVID-19 vaccinations.   ED Course: Upon admission into the emergency department patient was found to have O2 saturations as low as 85% on room air.  Labs relatively within normal limits.  Chest x-ray showed patchy bilateral lung opacities consistent with COVID-19 pneumonia and mild cardiomegaly.  Patient was given Decadron 6 mg IV x1 dose.  TRH called to admit.  Review of Systems  Constitutional: Positive for fever and malaise/fatigue.  HENT: Negative for hearing loss and nosebleeds.   Eyes: Negative for photophobia and pain.  Respiratory: Positive for cough, sputum production and shortness of breath.   Cardiovascular: Negative for chest pain and leg swelling.  Gastrointestinal: Positive for nausea and vomiting. Negative for diarrhea.  Genitourinary: Negative for flank pain and hematuria.  Musculoskeletal: Positive for  joint pain and myalgias.  Skin: Negative for itching and rash.  Neurological: Positive for weakness and headaches. Negative for focal weakness and loss of consciousness.  Endo/Heme/Allergies: Negative for environmental allergies and polydipsia.  Psychiatric/Behavioral: Negative for memory loss and substance abuse. The patient has insomnia.     Past Medical History:  Diagnosis Date  . Anxiety   . Arthritis   . Complication of anesthesia    slow to wake up-does not take much medicine to sedate her  . Depression   . Esophageal reflux 08/15/2015  . Hyperlipidemia 08/15/2015  . Paget disease of bone   . Wears glasses     Past Surgical History:  Procedure Laterality Date  . ABDOMINAL HYSTERECTOMY  8/11   TAH  . ANTERIOR FUSION CERVICAL SPINE  10/2019   C3-C5  . CERVICAL SPINE SURGERY  1/13  . DILATION AND CURETTAGE OF UTERUS  1986  . KNEE ARTHROSCOPY Left 07/22/2013   Procedure: LEFT KNEE ARTHROSCOPY WITH DEBRIDEMENT CHONDROPLASTY, REMOVAL LOOSE BODIES, PARTIAL MEDIAL MENISCECTOMY, OPEN EXCISION OF PES GANGLION;  Surgeon: Nestor LewandowskyFrank J Rowan, MD;  Location: [facility];  Service: Orthopedics;  Laterality: Left;  NO SPECIMEN SENT PER DR. Turner DanielsOWAN ORDER.  . TUBAL LIGATION  1989     reports that she quit smoking about 35 years ago. She has never used smokeless tobacco. She reports current alcohol use. She reports that she does not use drugs.  Allergies  Allergen Reactions  . Lamictal [Lamotrigine] Itching and Swelling    Family History  Problem Relation Age of Onset  . Thyroid disease Mother   . Congestive Heart Failure Mother   . Heart disease  Mother   . Lung cancer Sister   . Melanoma Sister   . Healthy Brother   . Healthy Brother   . Healthy Brother   . Healthy Brother   . Heart disease Father     Prior to Admission medications   Medication Sig Start Date End Date Taking? Authorizing Provider  atorvastatin (LIPITOR) 20 MG tablet Take 1 tablet (20 mg total) by  mouth every evening. 12/23/19   Gwenlyn Fudge, FNP  benzonatate (TESSALON PERLES) 100 MG capsule Take 1 capsule (100 mg total) by mouth 3 (three) times daily as needed for cough. 05/11/20   Gwenlyn Fudge, FNP  buPROPion (WELLBUTRIN XL) 150 MG 24 hr tablet Take 300 mg by mouth daily.     [provider]  dexamethasone (DECADRON) 6 MG tablet Take 1 tablet (6 mg total) by mouth daily. 05/11/20   Gwenlyn Fudge, FNP  gabapentin (NEURONTIN) 300 MG capsule Take 300 mg by mouth 2 (two) times daily. 03/24/20   [provider]  meloxicam (MOBIC) 15 MG tablet Take 1 tablet (15 mg total) by mouth daily as needed for pain. TAKE 1 Tablet BY MOUTH ONCE DAILY AS NEEDED FOR PAIN 12/23/19   Gwenlyn Fudge, FNP  omeprazole (PRILOSEC) 40 MG capsule Take 1 capsule (40 mg total) by mouth daily. 12/23/19   Gwenlyn Fudge, FNP  Probiotic Product (PROBIOTIC DAILY PO) Take 1 tablet by mouth daily.    [provider]  sertraline (ZOLOFT) 100 MG tablet Take 200 mg by mouth daily.     [provider]  traZODone (DESYREL) 50 MG tablet Take 50 mg by mouth at bedtime.    [provider]    Physical Exam:  Constitutional: Middle-aged female who appears to be ill Vitals:   05/20/20 0141 05/20/20 0359 05/20/20 0628 05/20/20 0800  BP: 110/72 110/76 120/66   Pulse: 86 83 66   Resp: 18 17 19    Temp:      TempSrc:      SpO2: 96% 98% 98%   Weight:    70.3 kg  Height:    5\' 1"  (1.549 m)   Eyes: PERRL, lids and conjunctivae normal ENMT: Mucous membranes are dry. Posterior pharynx clear of any exudate or lesions. Neck: normal, supple, no masses, no thyromegaly Respiratory: clear to auscultation bilaterally, no wheezing, no crackles. Normal respiratory effort. No accessory muscle use.  Cardiovascular: Regular rate and rhythm, no murmurs / rubs / gallops. No extremity edema. 2+ pedal pulses. No carotid bruits.  Abdomen: no tenderness, no masses palpated. No hepatosplenomegaly.  Bowel sounds positive.  Musculoskeletal: no clubbing / cyanosis. No joint deformity upper and lower extremities. Good ROM, no contractures. Normal muscle tone.  Skin: no rashes, lesions, ulcers. No induration Neurologic: CN 2-12 grossly intact. Sensation intact, DTR normal. Strength 5/5 in all 4.  Psychiatric: Normal judgment and insight. Alert and oriented x 3. Normal mood.     Labs on Admission: I have personally reviewed following labs and imaging studies  CBC: Recent Labs  Lab 05/19/20 1944  WBC 5.7  HGB 12.7  HCT 40.7  MCV 94.9  PLT 251   Basic Metabolic Panel: Recent Labs  Lab 05/19/20 1944  NA 136  K 3.7  CL 102  CO2 24  GLUCOSE 101*  BUN 16  CREATININE 0.89  CALCIUM 8.8*   GFR: Estimated Creatinine Clearance: 61.8 mL/min (by C-G formula based on SCr of 0.89 mg/dL). Liver Function Tests: No results for input(s): AST,  ALT, ALKPHOS, BILITOT, PROT, ALBUMIN in the last 168 hours. No results for input(s): LIPASE, AMYLASE in the last 168 hours. No results for input(s): AMMONIA in the last 168 hours. Coagulation Profile: No results for input(s): INR, PROTIME in the last 168 hours. Cardiac Enzymes: No results for input(s): CKTOTAL, CKMB, CKMBINDEX, TROPONINI in the last 168 hours. BNP (last 3 results) No results for input(s): PROBNP in the last 8760 hours. HbA1C: No results for input(s): HGBA1C in the last 72 hours. CBG: No results for input(s): GLUCAP in the last 168 hours. Lipid Profile: No results for input(s): CHOL, HDL, LDLCALC, TRIG, CHOLHDL, LDLDIRECT in the last 72 hours. Thyroid Function Tests: No results for input(s): TSH, T4TOTAL, FREET4, T3FREE, THYROIDAB in the last 72 hours. Anemia Panel: No results for input(s): VITAMINB12, FOLATE, FERRITIN, TIBC, IRON, RETICCTPCT in the last 72 hours. Urine analysis: No results found for: COLORURINE, APPEARANCEUR, LABSPEC, PHURINE, GLUCOSEU, HGBUR, BILIRUBINUR, KETONESUR, PROTEINUR, UROBILINOGEN, NITRITE,  LEUKOCYTESUR Sepsis Labs: Recent Results (from the past 240 hour(s))  Novel Coronavirus, NAA ([facility])     Status: Abnormal   Collection Time: 05/11/20  4:30 PM   Specimen: Nasopharyngeal(NP) swabs in vial transport medium  Result Value Ref Range Status   SARS-CoV-2, NAA Detected (A) Not Detected Final    Comment: Patients who have a positive COVID-19 test result may now have treatment options. Treatment options are available for patients with mild to moderate symptoms and for hospitalized patients. Visit our website at [email or web address] for resources and information. This nucleic acid amplification test was developed and its performance characteristics determined by [employer]. Nucleic acid amplification tests include RT-PCR and TMA. This test has not been FDA cleared or approved. This test has been authorized by FDA under an Emergency Use Authorization (EUA). This test is only authorized for the duration of time the declaration that circumstances exist justifying the authorization of the emergency use of in vitro diagnostic tests for detection of SARS-CoV-2 virus and/or diagnosis of COVID-19 infection under section 564(b)(1) of the Act, 21 U.S.C. 101BPZ-0(C) (1), unless the authorization is terminated or revoked sooner. When diagnostic testing is negativ e, the possibility of a false negative result should be considered in the context of a patient's recent exposures and the presence of clinical signs and symptoms consistent with COVID-19. An individual without symptoms of COVID-19 and who is not shedding SARS-CoV-2 virus would expect to have a negative (not detected) result in this assay.   SARS-COV-2, NAA 2 DAY TAT     Status: None   Collection Time: 05/11/20  4:30 PM  Result Value Ref Range Status   SARS-CoV-2, NAA 2 DAY TAT Performed  Final  Rapid Strep Screen ([facility] ONLY)     Status: None   Collection Time: 05/11/20  4:31 PM   Specimen:  Other   Other  Result Value Ref Range Status   Strep Gp A Ag, IA W/Reflex Negative Negative Final  Culture, Group A Strep     Status: None   Collection Time: 05/11/20  4:31 PM   Other  Result Value Ref Range Status   Strep A Culture CANCELED      Comment: Test not performed  Result canceled by the ancillary.      Radiological Exams on Admission: DG Chest Portable 1 View  Result Date: 05/19/2020 CLINICAL DATA:  Hypoxia. COVID positive. Shortness of breath body aches EXAM: PORTABLE CHEST 1 VIEW COMPARISON:  12/22/2015 FINDINGS: Lung volumes are low. Patchy bilateral heterogeneous lung opacities in a  peripheral and basilar predominant distribution. Mild cardiomegaly with normal mediastinal contours. No pleural fluid or pneumothorax. No acute osseous abnormalities are seen. IMPRESSION: 1. Low lung volumes. Patchy bilateral heterogeneous lung opacities in a peripheral and basilar predominant distribution, pattern typical of COVID-19 pneumonia. 2. Mild cardiomegaly. Electronically Signed   By: Narda Rutherford M.D.   On: 05/19/2020 22:37    EKG: Independently reviewed.  Normal sinus rhythm 83 bpm  Assessment/Plan Acute respiratory failure with hypoxia secondary to pneumonia due to COVID-19: Acute.  Patient presents with worsening shortness of breath and cough.  Diagnosed with COVID-19 on 8/11.  O2 saturations noted to be as low as 84% on room air placed on 2 L of nasal cannula oxygen.  Chest x-ray showing bilateral opacities typical of COVID-19 pneumonia. -Admit to a medical telemetry bed -COVID-19 admission order set utilized -Continuous pulse oximetry with nasal cannula oxygen to maintain O2 saturation greater than 90% -Remdesivir per pharmacy -Albuterol inhaler -Decadron 6 mg daily -Antitussives as needed -Vitamin C and zinc -Tylenol as needed for fever -Follow-up inflammatory markers and continue monitoring daily  Anxiety/depression: Home medications include Wellbutrin 300 mg daily  and Zoloft 200 mg nightly -Continue current home regimen  Hyperlipidemia: Home medications include atorvastatin 20 mg nightly. -Continue atorvastatin  GERD: Home medications include omeprazole 40 mg daily.   -Continue pharmacy substitution of Protonix  History of Paget's disease: Diagnosis initial in 2013 and given Reclast at that time, but has not been on any treatment since that time.  DVT prophylaxis: Lovenox Code Status: Full Family Communication: Husband updated over the phone Disposition Plan: Likely discharge home in 3 to 4 days Consults called: None Admission status: Inpatient  Clydie Braun MD [facility] Pager [phone]   If 7PM-7AM, please contact night-coverage [email or web address] Password [facility]  05/20/2020, 8:33 AM

## 2020-05-20 NOTE — ED Notes (Signed)
Per MD O2 sats 84% on RA an applied 2L via 15% O2 sats now 97%

## 2020-05-21 LAB — COMPREHENSIVE METABOLIC PANEL
ALT: 17 U/L (ref 0–44)
AST: 25 U/L (ref 15–41)
Albumin: 2.8 g/dL — ABNORMAL LOW (ref 3.5–5.0)
Alkaline Phosphatase: 56 U/L (ref 38–126)
Anion gap: 13 (ref 5–15)
BUN: 18 mg/dL (ref 6–20)
CO2: 22 mmol/L (ref 22–32)
Calcium: 9.3 mg/dL (ref 8.9–10.3)
Chloride: 104 mmol/L (ref 98–111)
Creatinine, Ser: 0.8 mg/dL (ref 0.44–1.00)
GFR calc Af Amer: >60 mL/min (ref >60)
GFR calc non Af Amer: >60 mL/min (ref >60)
Glucose, Bld: 100 mg/dL — ABNORMAL HIGH (ref 70–99)
Potassium: 4 mmol/L (ref 3.5–5.1)
Sodium: 139 mmol/L (ref 135–145)
Total Bilirubin: 0.6 mg/dL (ref 0.3–1.2)
Total Protein: 6.8 g/dL (ref 6.5–8.1)

## 2020-05-21 LAB — CBC WITH DIFFERENTIAL/PLATELET
Abs Immature Granulocytes: 0.1 10*3/uL — ABNORMAL HIGH (ref 0.00–0.07)
Basophils Absolute: 0 10*3/uL (ref 0.0–0.1)
Basophils Relative: 1 %
Eosinophils Absolute: 0 10*3/uL (ref 0.0–0.5)
Eosinophils Relative: 0 %
HCT: 39 % (ref 36.0–46.0)
Hemoglobin: 12.1 g/dL (ref 12.0–15.0)
Immature Granulocytes: 2 %
Lymphocytes Relative: 12 %
Lymphs Abs: 0.5 10*3/uL — ABNORMAL LOW (ref 0.7–4.0)
MCH: 29.6 pg (ref 26.0–34.0)
MCHC: 31 g/dL (ref 30.0–36.0)
MCV: 95.4 fL (ref 80.0–100.0)
Monocytes Absolute: 0.4 10*3/uL (ref 0.1–1.0)
Monocytes Relative: 9 %
Neutro Abs: 3.1 10*3/uL (ref 1.7–7.7)
Neutrophils Relative %: 76 %
Platelets: 285 10*3/uL (ref 150–400)
RBC: 4.09 MIL/uL (ref 3.87–5.11)
RDW: 13.2 % (ref 11.5–15.5)
WBC: 4.1 10*3/uL (ref 4.0–10.5)
nRBC: 0 % (ref 0.0–0.2)

## 2020-05-21 LAB — FERRITIN: Ferritin: 186 ng/mL (ref 11–307)

## 2020-05-21 LAB — PHOSPHORUS: Phosphorus: 3.9 mg/dL (ref 2.5–4.6)

## 2020-05-21 LAB — MAGNESIUM: Magnesium: 2.2 mg/dL (ref 1.7–2.4)

## 2020-05-21 LAB — D-DIMER, QUANTITATIVE: D-Dimer, Quant: 0.67 ug/mL-FEU — ABNORMAL HIGH (ref 0.00–0.50)

## 2020-05-21 LAB — C-REACTIVE PROTEIN: CRP: 7.8 mg/dL — ABNORMAL HIGH (ref <1.0)

## 2020-05-21 MED ORDER — METHYLPREDNISOLONE SODIUM SUCC 40 MG IJ SOLR
35.0000 mg | Freq: Two times a day (BID) | INTRAMUSCULAR | Status: DC
  Administered 2020-05-21 – 2020-05-29 (×17): 35 mg via INTRAVENOUS
  Filled 2020-05-21 (×17): qty 1

## 2020-05-21 MED ORDER — BARICITINIB 2 MG PO TABS
4.0000 mg | ORAL_TABLET | Freq: Every day | ORAL | Status: DC
  Administered 2020-05-21 – 2020-05-28 (×8): 4 mg via ORAL
  Filled 2020-05-21 (×8): qty 2

## 2020-05-21 NOTE — ED Notes (Signed)
Dinner tray delivered.

## 2020-05-21 NOTE — Progress Notes (Signed)
PROGRESS NOTE  Julia [name] KXF:[id number] DOB: 02-21-1961 DOA: 05/19/2020 PCP: Gwenlyn Fudge, FNP   LOS: 1 day   Brief Narrative / Interim history: 59 year old female with history of Behcet's disease, hyperlipidemia, anxiety, depression, came in with shortness of breath and cough for the past 10 days.  She was initially diagnosed with Covid on 8/11, and has been having progressive malaise, headache, intermittent fevers, loss of taste and eventually came to the hospital due to shortness of breath.  She is unvaccinated  Subjective / 24h Interval events: She appreciates slight improvement but still quite short of breath.  Has not been out of bed.  Assessment & Plan:  Principal Problem Acute Hypoxic Respiratory Failure due to Covid-19 Viral Illness -Patient started on remdesivir for 5 days, today is day 2, with end date on Tuesday 8/24 -Patient started on steroids, convert to Solu-Medrol 8/21 -Patient quite hypoxic, desatted with minimal activity, she is at high risk for decompensation, start baricitinib. Baricitinib is being used under EUA by the FDA. The patient has no ESRD or AKI, known history of TB, severe neutropenia (ANC <500) or lymphopenia (ALC <200), or severe LFT elevations. They are not on DMARDs or probenecid, and are not pregnant. The option to use/refuse baricitinib treatment under FDA authorization (not approval), the significant known and potential risks and benefits, the extent to which these are unknown, and information regarding all available alternatives were discussed in detail. Specifically the risk of VTE and secondary infections were discussed in detail with the patient. She consents to proceed with treatment. Monitor Cr, LFTs, differential. Keep on VTE ppx.    COVID-19 Labs  Recent Labs    05/20/20 1005 05/21/20 0333  DDIMER 0.82* 0.67*  FERRITIN 205 186  LDH 344*  --   CRP 8.9* 7.8*    Lab Results  Component Value Date   SARSCOV2NAA Detected (A)  05/11/2020    Active Problems Anxiety/depression-continue home medications with Wellbutrin, Zoloft  Hyperlipidemia-continue statin  GERD-continue PPI  History of Paget's disease-outpatient management  Scheduled Meds: . albuterol  2 puff Inhalation Q6H  . vitamin C  500 mg Oral Daily  . atorvastatin  20 mg Oral QPM  . baricitinib  4 mg Oral Daily  . buPROPion  300 mg Oral Daily  . dexamethasone  6 mg Oral Q24H  . enoxaparin (LOVENOX) injection  40 mg Subcutaneous Daily  . gabapentin  300 mg Oral BID  . pantoprazole  40 mg Oral Daily  . sertraline  200 mg Oral Daily  . traZODone  50 mg Oral QHS  . zinc sulfate  220 mg Oral Daily   Continuous Infusions: . remdesivir 100 mg in NS 100 mL     PRN Meds:.acetaminophen, chlorpheniramine-HYDROcodone, guaiFENesin-dextromethorphan, ondansetron **OR** ondansetron (ZOFRAN) IV  DVT prophylaxis: Lovenox Code Status: Full code Family Communication: no family at bedside    Status is: Inpatient  Remains inpatient appropriate because:Inpatient level of care appropriate due to severity of illness   Dispo: The patient is from: Home              Anticipated d/c is to: Home              Anticipated d/c date is: 3 days              Patient currently is not medically stable to d/c.  Consultants:  none  Procedures:  None   Microbiology: None   Antibacterials: None    Objective: Vitals:   05/21/20 0745 05/21/20  0800 05/21/20 0815 05/21/20 0830  BP: 103/70 118/88 119/79 109/74  Pulse: 68 90 78 85  Resp: (!) 30 20 (!) 25 16  Temp:      TempSrc:      SpO2: 92% (!) 87% (!) 89% 93%  Weight:      Height:        Intake/Output Summary (Last 24 hours) at 05/21/2020 0950 Last data filed at 05/20/2020 1204 Gross per 24 hour  Intake 299.4 ml  Output --  Net 299.4 ml   Filed Weights   05/19/20 1839 05/20/20 0800  Weight: 70.3 kg 70.3 kg    Examination:  Constitutional: Tachypneic Eyes: no scleral icterus ENMT: Mucous  membranes are moist.  Neck: normal, supple Respiratory: Diffuse bibasilar rhonchi, no wheezing.  Increased respiratory effort with tachypnea Cardiovascular: Regular rate and rhythm, no murmurs / rubs / gallops.  No edema Abdomen: non distended, no tenderness. Bowel sounds positive.  Musculoskeletal: no clubbing / cyanosis.  Skin: no rashes Neurologic: CN 2-12 grossly intact. Strength 5/5 in all 4.    Data Reviewed: I have independently reviewed following labs and imaging studies   CBC: Recent Labs  Lab 05/19/20 1944 05/21/20 0333  WBC 5.7 4.1  NEUTROABS  --  3.1  HGB 12.7 12.1  HCT 40.7 39.0  MCV 94.9 95.4  PLT 251 285   Basic Metabolic Panel: Recent Labs  Lab 05/19/20 1944 05/21/20 0333  NA 136 139  K 3.7 4.0  CL 102 104  CO2 24 22  GLUCOSE 101* 100*  BUN 16 18  CREATININE 0.89 0.80  CALCIUM 8.8* 9.3  MG  --  2.2  PHOS  --  3.9   GFR: Estimated Creatinine Clearance: 68.7 mL/min (by C-G formula based on SCr of 0.8 mg/dL). Liver Function Tests: Recent Labs  Lab 05/21/20 0333  AST 25  ALT 17  ALKPHOS 56  BILITOT 0.6  PROT 6.8  ALBUMIN 2.8*   No results for input(s): LIPASE, AMYLASE in the last 168 hours. No results for input(s): AMMONIA in the last 168 hours. Coagulation Profile: No results for input(s): INR, PROTIME in the last 168 hours. Cardiac Enzymes: No results for input(s): CKTOTAL, CKMB, CKMBINDEX, TROPONINI in the last 168 hours. BNP (last 3 results) No results for input(s): PROBNP in the last 8760 hours. HbA1C: No results for input(s): HGBA1C in the last 72 hours. CBG: No results for input(s): GLUCAP in the last 168 hours. Lipid Profile: Recent Labs    05/20/20 1005  TRIG 121   Thyroid Function Tests: No results for input(s): TSH, T4TOTAL, FREET4, T3FREE, THYROIDAB in the last 72 hours. Anemia Panel: Recent Labs    05/20/20 1005 05/21/20 0333  FERRITIN 205 186   Urine analysis: No results found for: COLORURINE, APPEARANCEUR,  LABSPEC, PHURINE, GLUCOSEU, HGBUR, BILIRUBINUR, KETONESUR, PROTEINUR, UROBILINOGEN, NITRITE, LEUKOCYTESUR Sepsis Labs: Invalid input(s): PROCALCITONIN, LACTICIDVEN  Recent Results (from the past 240 hour(s))  Novel Coronavirus, NAA ([facility])     Status: Abnormal   Collection Time: 05/11/20  4:30 PM   Specimen: Nasopharyngeal(NP) swabs in vial transport medium  Result Value Ref Range Status   SARS-CoV-2, NAA Detected (A) Not Detected Final    Comment: Patients who have a positive COVID-19 test result may now have treatment options. Treatment options are available for patients with mild to moderate symptoms and for hospitalized patients. Visit our website at [email or web address] for resources and information. This nucleic acid amplification test was developed and its performance characteristics determined by [employer]. Nucleic  acid amplification tests include RT-PCR and TMA. This test has not been FDA cleared or approved. This test has been authorized by FDA under an Emergency Use Authorization (EUA). This test is only authorized for the duration of time the declaration that circumstances exist justifying the authorization of the emergency use of in vitro diagnostic tests for detection of SARS-CoV-2 virus and/or diagnosis of COVID-19 infection under section 564(b)(1) of the Act, 21 U.S.C. 970YOV-7(C) (1), unless the authorization is terminated or revoked sooner. When diagnostic testing is negativ e, the possibility of a false negative result should be considered in the context of a patient's recent exposures and the presence of clinical signs and symptoms consistent with COVID-19. An individual without symptoms of COVID-19 and who is not shedding SARS-CoV-2 virus would expect to have a negative (not detected) result in this assay.   SARS-COV-2, NAA 2 DAY TAT     Status: None   Collection Time: 05/11/20  4:30 PM  Result Value Ref Range Status   SARS-CoV-2, NAA 2  DAY TAT Performed  Final  Rapid Strep Screen ([facility] ONLY)     Status: None   Collection Time: 05/11/20  4:31 PM   Specimen: Other   Other  Result Value Ref Range Status   Strep Gp A Ag, IA W/Reflex Negative Negative Final  Culture, Group A Strep     Status: None   Collection Time: 05/11/20  4:31 PM   Other  Result Value Ref Range Status   Strep A Culture CANCELED      Comment: Test not performed  Result canceled by the ancillary.       Radiology Studies: DG Chest Portable 1 View  Result Date: 05/19/2020 CLINICAL DATA:  Hypoxia. COVID positive. Shortness of breath body aches EXAM: PORTABLE CHEST 1 VIEW COMPARISON:  12/22/2015 FINDINGS: Lung volumes are low. Patchy bilateral heterogeneous lung opacities in a peripheral and basilar predominant distribution. Mild cardiomegaly with normal mediastinal contours. No pleural fluid or pneumothorax. No acute osseous abnormalities are seen. IMPRESSION: 1. Low lung volumes. Patchy bilateral heterogeneous lung opacities in a peripheral and basilar predominant distribution, pattern typical of COVID-19 pneumonia. 2. Mild cardiomegaly. Electronically Signed   By: Narda Rutherford M.D.   On: 05/19/2020 22:37    Pamella Pert, MD, PhD [facility]  Between 7 am - 7 pm I am available, please contact me via Amion or Securechat  Between 7 pm - 7 am I am not available, please contact night coverage MD/APP via Amion

## 2020-05-22 ENCOUNTER — Encounter (HOSPITAL_COMMUNITY): Payer: Self-pay | Admitting: Internal Medicine

## 2020-05-22 LAB — COMPREHENSIVE METABOLIC PANEL WITH GFR
ALT: 21 U/L (ref 0–44)
AST: 28 U/L (ref 15–41)
Albumin: 2.7 g/dL — ABNORMAL LOW (ref 3.5–5.0)
Alkaline Phosphatase: 53 U/L (ref 38–126)
Anion gap: 11 (ref 5–15)
BUN: 22 mg/dL — ABNORMAL HIGH (ref 6–20)
CO2: 23 mmol/L (ref 22–32)
Calcium: 9.1 mg/dL (ref 8.9–10.3)
Chloride: 106 mmol/L (ref 98–111)
Creatinine, Ser: 0.79 mg/dL (ref 0.44–1.00)
GFR calc Af Amer: >60 mL/min
GFR calc non Af Amer: >60 mL/min
Glucose, Bld: 146 mg/dL — ABNORMAL HIGH (ref 70–99)
Potassium: 4.3 mmol/L (ref 3.5–5.1)
Sodium: 140 mmol/L (ref 135–145)
Total Bilirubin: 0.2 mg/dL — ABNORMAL LOW (ref 0.3–1.2)
Total Protein: 6.2 g/dL — ABNORMAL LOW (ref 6.5–8.1)

## 2020-05-22 LAB — CBC WITH DIFFERENTIAL/PLATELET
Abs Immature Granulocytes: 0.21 10*3/uL — ABNORMAL HIGH (ref 0.00–0.07)
Basophils Absolute: 0 10*3/uL (ref 0.0–0.1)
Basophils Relative: 0 %
Eosinophils Absolute: 0 10*3/uL (ref 0.0–0.5)
Eosinophils Relative: 0 %
HCT: 37.1 % (ref 36.0–46.0)
Hemoglobin: 11.5 g/dL — ABNORMAL LOW (ref 12.0–15.0)
Immature Granulocytes: 4 %
Lymphocytes Relative: 8 %
Lymphs Abs: 0.4 10*3/uL — ABNORMAL LOW (ref 0.7–4.0)
MCH: 29.7 pg (ref 26.0–34.0)
MCHC: 31 g/dL (ref 30.0–36.0)
MCV: 95.9 fL (ref 80.0–100.0)
Monocytes Absolute: 0.2 10*3/uL (ref 0.1–1.0)
Monocytes Relative: 5 %
Neutro Abs: 3.9 10*3/uL (ref 1.7–7.7)
Neutrophils Relative %: 83 %
Platelets: 344 10*3/uL (ref 150–400)
RBC: 3.87 MIL/uL (ref 3.87–5.11)
RDW: 13.2 % (ref 11.5–15.5)
WBC: 4.8 10*3/uL (ref 4.0–10.5)
nRBC: 0 % (ref 0.0–0.2)

## 2020-05-22 LAB — MAGNESIUM: Magnesium: 2 mg/dL (ref 1.7–2.4)

## 2020-05-22 LAB — D-DIMER, QUANTITATIVE: D-Dimer, Quant: 0.84 ug{FEU}/mL — ABNORMAL HIGH (ref 0.00–0.50)

## 2020-05-22 LAB — C-REACTIVE PROTEIN: CRP: 3.6 mg/dL — ABNORMAL HIGH

## 2020-05-22 LAB — PHOSPHORUS: Phosphorus: 3.8 mg/dL (ref 2.5–4.6)

## 2020-05-22 NOTE — ED Notes (Signed)
Pt spO2 98% on 6L humidified , titrated down to 5L, maintaining 96% at this time. Will monitor and attempt continued titration

## 2020-05-22 NOTE — Progress Notes (Signed)
PROGRESS NOTE  Julia [name] TIR:[id number] DOB: [birth year] DOA: 05/19/2020 PCP: Gwenlyn Fudge, FNP   LOS: 2 days   Brief Narrative / Interim history: 59 year old female with history of Behcet's disease, hyperlipidemia, anxiety, depression, came in with shortness of breath and cough for the past 10 days.  She was initially diagnosed with Covid on 8/11, and has been having progressive malaise, headache, intermittent fevers, loss of taste and eventually came to the hospital due to shortness of breath.  She is unvaccinated  Subjective / 24h Interval events: Still remains short of breath, overall feels slightly improved.  Gets worse when she tries to get up and move around.  No chest pain, no abdominal pain, no nausea or vomiting.  Assessment & Plan:  Principal Problem Acute Hypoxic Respiratory Failure due to Covid-19 Viral Illness -Patient started on remdesivir for 5 days, today is day 3, with end date on Tuesday 8/24 -Patient started on steroids, convert to Solu-Medrol 8/21 -Started on baricitinib as well, continue -Wean off oxygen and maintain goal O2 sats of 88%   COVID-19 Labs  Recent Labs    05/20/20 1005 05/21/20 0333 05/22/20 0527  DDIMER 0.82* 0.67* 0.84*  FERRITIN 205 186  --   LDH 344*  --   --   CRP 8.9* 7.8* 3.6*    Lab Results  Component Value Date   SARSCOV2NAA Detected (A) 05/11/2020    Active Problems Anxiety/depression-continue home medications with Wellbutrin, Zoloft  Hyperlipidemia-continue statin  GERD-continue PPI  History of Paget's disease-outpatient management  Scheduled Meds: . albuterol  2 puff Inhalation Q6H  . vitamin C  500 mg Oral Daily  . atorvastatin  20 mg Oral QPM  . baricitinib  4 mg Oral Daily  . buPROPion  300 mg Oral Daily  . enoxaparin (LOVENOX) injection  40 mg Subcutaneous Daily  . gabapentin  300 mg Oral BID  . methylPREDNISolone (SOLU-MEDROL) injection  35 mg Intravenous Q12H  . pantoprazole  40 mg Oral Daily  .  sertraline  200 mg Oral Daily  . traZODone  50 mg Oral QHS  . zinc sulfate  220 mg Oral Daily   Continuous Infusions: . remdesivir 100 mg in NS 100 mL Stopped (05/22/20 1237)   PRN Meds:.acetaminophen, chlorpheniramine-HYDROcodone, guaiFENesin-dextromethorphan, ondansetron **OR** ondansetron (ZOFRAN) IV  DVT prophylaxis: Lovenox Code Status: Full code Family Communication: no family at bedside, discussed with husband over the phone   Status is: Inpatient  Remains inpatient appropriate because:Inpatient level of care appropriate due to severity of illness   Dispo: The patient is from: Home              Anticipated d/c is to: Home              Anticipated d/c date is: 3 days              Patient currently is not medically stable to d/c.  Consultants:  none  Procedures:  None   Microbiology: None   Antibacterials: None    Objective: Vitals:   05/22/20 1145 05/22/20 1200 05/22/20 1215 05/22/20 1230  BP: 123/73 112/78 (!) 88/55 (!) 100/56  Pulse: 79 73 64 62  Resp: (!) 23 (!) 21 18 20   Temp:      TempSrc:      SpO2: 94% 100% 97% 96%  Weight:      Height:        Intake/Output Summary (Last 24 hours) at 05/22/2020 1323 Last data filed at 05/22/2020 1139 Gross per  24 hour  Intake --  Output 350 ml  Net -350 ml   Filed Weights   May 24, 2020 1839 05/20/20 0800  Weight: 70.3 kg 70.3 kg    Examination:  Constitutional: No distress Eyes: No scleral icterus ENMT: mmm Neck: normal, supple Respiratory: Diffuse bibasilar rhonchi, no wheezing, no crackles.  Increased respiratory effort, tachypneic Cardiovascular: Regular rate and rhythm, no murmurs appreciated.  No peripheral edema Abdomen: Soft, nontender, nondistended, bowel sounds positive Musculoskeletal: no clubbing / cyanosis.  Skin: No rashes appreciated Neurologic: Grossly nonfocal, ambulatory.   Data Reviewed: I have independently reviewed following labs and imaging studies   CBC: Recent Labs  Lab  2020-05-24 1944 05/21/20 0333 05/22/20 0527  WBC 5.7 4.1 4.8  NEUTROABS  --  3.1 3.9  HGB 12.7 12.1 11.5*  HCT 40.7 39.0 37.1  MCV 94.9 95.4 95.9  PLT 251 285 344   Basic Metabolic Panel: Recent Labs  Lab May 24, 2020 1944 05/21/20 0333 05/22/20 0527  NA 136 139 140  K 3.7 4.0 4.3  CL 102 104 106  CO2 24 22 23   GLUCOSE 101* 100* 146*  BUN 16 18 22*  CREATININE 0.89 0.80 0.79  CALCIUM 8.8* 9.3 9.1  MG  --  2.2 2.0  PHOS  --  3.9 3.8   GFR: Estimated Creatinine Clearance: 68.7 mL/min (by C-G formula based on SCr of 0.79 mg/dL). Liver Function Tests: Recent Labs  Lab 05/21/20 0333 05/22/20 0527  AST 25 28  ALT 17 21  ALKPHOS 56 53  BILITOT 0.6 0.2*  PROT 6.8 6.2*  ALBUMIN 2.8* 2.7*   No results for input(s): LIPASE, AMYLASE in the last 168 hours. No results for input(s): AMMONIA in the last 168 hours. Coagulation Profile: No results for input(s): INR, PROTIME in the last 168 hours. Cardiac Enzymes: No results for input(s): CKTOTAL, CKMB, CKMBINDEX, TROPONINI in the last 168 hours. BNP (last 3 results) No results for input(s): PROBNP in the last 8760 hours. HbA1C: No results for input(s): HGBA1C in the last 72 hours. CBG: No results for input(s): GLUCAP in the last 168 hours. Lipid Profile: Recent Labs    05/20/20 1005  TRIG 121   Thyroid Function Tests: No results for input(s): TSH, T4TOTAL, FREET4, T3FREE, THYROIDAB in the last 72 hours. Anemia Panel: Recent Labs    05/20/20 1005 05/21/20 0333  FERRITIN 205 186   Urine analysis: No results found for: COLORURINE, APPEARANCEUR, LABSPEC, PHURINE, GLUCOSEU, HGBUR, BILIRUBINUR, KETONESUR, PROTEINUR, UROBILINOGEN, NITRITE, LEUKOCYTESUR Sepsis Labs: Invalid input(s): PROCALCITONIN, LACTICIDVEN  Recent Results (from the past 240 hour(s))  Blood Culture (routine x 2)     Status: None (Preliminary result)   Collection Time: 05/20/20  8:11 AM   Specimen: BLOOD  Result Value Ref Range Status   Specimen  Description BLOOD RIGHT ANTECUBITAL  Final   Special Requests   Final    BOTTLES DRAWN AEROBIC AND ANAEROBIC Blood Culture adequate volume   Culture   Final    NO GROWTH 2 DAYS Performed at [facility], [address] [place], [place]    Report Status PENDING  Incomplete  Blood Culture (routine x 2)     Status: None (Preliminary result)   Collection Time: 05/20/20  8:16 AM   Specimen: BLOOD LEFT HAND  Result Value Ref Range Status   Specimen Description BLOOD LEFT HAND  Final   Special Requests   Final    BOTTLES DRAWN AEROBIC AND ANAEROBIC Blood Culture results may not be optimal due  to an inadequate volume of blood received in culture bottles   Culture   Final    NO GROWTH 2 DAYS Performed at [facility], [address], [place], [place] [address]    Report Status PENDING  Incomplete      Radiology Studies: No results found.  Pamella Pert, MD, PhD [facility]  Between 7 am - 7 pm I am available, please contact me via Amion or Securechat  Between 7 pm - 7 am I am not available, please contact night coverage MD/APP via Amion

## 2020-05-23 LAB — COMPREHENSIVE METABOLIC PANEL
ALT: 20 U/L (ref 0–44)
AST: 29 U/L (ref 15–41)
Albumin: 2.7 g/dL — ABNORMAL LOW (ref 3.5–5.0)
Alkaline Phosphatase: 53 U/L (ref 38–126)
Anion gap: 12 (ref 5–15)
BUN: 21 mg/dL — ABNORMAL HIGH (ref 6–20)
CO2: 23 mmol/L (ref 22–32)
Calcium: 9.3 mg/dL (ref 8.9–10.3)
Chloride: 105 mmol/L (ref 98–111)
Creatinine, Ser: 0.86 mg/dL (ref 0.44–1.00)
GFR calc Af Amer: >60 mL/min (ref >60)
GFR calc non Af Amer: >60 mL/min (ref >60)
Glucose, Bld: 129 mg/dL — ABNORMAL HIGH (ref 70–99)
Potassium: 5 mmol/L (ref 3.5–5.1)
Sodium: 140 mmol/L (ref 135–145)
Total Bilirubin: 0.5 mg/dL (ref 0.3–1.2)
Total Protein: 6.4 g/dL — ABNORMAL LOW (ref 6.5–8.1)

## 2020-05-23 LAB — CBC WITH DIFFERENTIAL/PLATELET
Abs Immature Granulocytes: 0.41 10*3/uL — ABNORMAL HIGH (ref 0.00–0.07)
Basophils Absolute: 0.1 10*3/uL (ref 0.0–0.1)
Basophils Relative: 1 %
Eosinophils Absolute: 0 10*3/uL (ref 0.0–0.5)
Eosinophils Relative: 0 %
HCT: 36.7 % (ref 36.0–46.0)
Hemoglobin: 11.8 g/dL — ABNORMAL LOW (ref 12.0–15.0)
Immature Granulocytes: 5 %
Lymphocytes Relative: 6 %
Lymphs Abs: 0.5 10*3/uL — ABNORMAL LOW (ref 0.7–4.0)
MCH: 30.5 pg (ref 26.0–34.0)
MCHC: 32.2 g/dL (ref 30.0–36.0)
MCV: 94.8 fL (ref 80.0–100.0)
Monocytes Absolute: 0.3 10*3/uL (ref 0.1–1.0)
Monocytes Relative: 4 %
Neutro Abs: 6.8 10*3/uL (ref 1.7–7.7)
Neutrophils Relative %: 84 %
Platelets: 353 10*3/uL (ref 150–400)
RBC: 3.87 MIL/uL (ref 3.87–5.11)
RDW: 13.2 % (ref 11.5–15.5)
WBC: 8.1 10*3/uL (ref 4.0–10.5)
nRBC: 0 % (ref 0.0–0.2)

## 2020-05-23 LAB — D-DIMER, QUANTITATIVE: D-Dimer, Quant: 0.67 ug/mL-FEU — ABNORMAL HIGH (ref 0.00–0.50)

## 2020-05-23 LAB — C-REACTIVE PROTEIN: CRP: 1.7 mg/dL — ABNORMAL HIGH (ref <1.0)

## 2020-05-23 LAB — MAGNESIUM: Magnesium: 2.2 mg/dL (ref 1.7–2.4)

## 2020-05-23 LAB — PHOSPHORUS: Phosphorus: 3.7 mg/dL (ref 2.5–4.6)

## 2020-05-23 NOTE — Progress Notes (Signed)
PROGRESS NOTE  Julia [name] CWU:[id number] DOB: September 09, 1961 DOA: 05/19/2020 PCP: Gwenlyn Fudge, FNP   LOS: 3 days   Brief Narrative / Interim history: 59 year old female with history of Paget disease, hyperlipidemia, anxiety, depression, came in with shortness of breath and cough for the past 10 days.  She was initially diagnosed with Covid on 8/11, and has been having progressive malaise, headache, intermittent fevers, loss of taste and eventually came to the hospital due to shortness of breath.  She is unvaccinated  Subjective / 24h Interval events: Seen when she just got back from the bathroom, quite short of breath with sats in the upper 70s on 5 L.  Overall she appreciates she has improved however feels quite dyspneic with activity.  No abdominal pain, no vomiting.  Assessment & Plan:  Principal Problem Acute Hypoxic Respiratory Failure due to Covid-19 Viral Illness -Patient started on remdesivir for 5 days, today is day 4, with end date on Tuesday 8/24 -Patient started on steroids, continue -Continue baricitinib -OK with mild hypoxemia, goal at rest is > 85% SaO2, with movement ideally > 75% -encouraged the patient to sit up in chair in the daytime use I-S and flutter valve for pulmonary toiletry and then prone in bed when at night. -Wean off oxygen as tolerated   COVID-19 Labs  Recent Labs    05/21/20 0333 05/22/20 0527 05/23/20 0309  DDIMER 0.67* 0.84* 0.67*  FERRITIN 186  --   --   CRP 7.8* 3.6* 1.7*    Lab Results  Component Value Date   SARSCOV2NAA Detected (A) 05/11/2020    Active Problems Anxiety/depression-continue home medications with Wellbutrin, Zoloft  Hyperlipidemia-continue statin  GERD-continue PPI  History of Paget's disease-outpatient management  Scheduled Meds: . albuterol  2 puff Inhalation Q6H  . vitamin C  500 mg Oral Daily  . atorvastatin  20 mg Oral QPM  . baricitinib  4 mg Oral Daily  . buPROPion  300 mg Oral Daily  .  enoxaparin (LOVENOX) injection  40 mg Subcutaneous Daily  . gabapentin  300 mg Oral BID  . methylPREDNISolone (SOLU-MEDROL) injection  35 mg Intravenous Q12H  . pantoprazole  40 mg Oral Daily  . sertraline  200 mg Oral Daily  . traZODone  50 mg Oral QHS  . zinc sulfate  220 mg Oral Daily   Continuous Infusions: . remdesivir 100 mg in NS 100 mL 100 mg (05/23/20 0826)   PRN Meds:.acetaminophen, chlorpheniramine-HYDROcodone, guaiFENesin-dextromethorphan, ondansetron **OR** ondansetron (ZOFRAN) IV  DVT prophylaxis: Lovenox Code Status: Full code Family Communication: no family at bedside, discussed with husband over the phone  Status is: Inpatient  Remains inpatient appropriate because:Inpatient level of care appropriate due to severity of illness   Dispo: The patient is from: Home              Anticipated d/c is to: Home              Anticipated d/c date is:2 - 3 days              Patient currently is not medically stable to d/c.  Consultants:  none  Procedures:  None   Microbiology: None   Antibacterials: None    Objective: Vitals:   05/22/20 1959 05/22/20 2200 05/22/20 2354 05/23/20 0419  BP: 102/69  104/70 110/73  Pulse: 78 85 74 65  Resp: (!) 21 19 (!) 21 19  Temp: 98.5 F (36.9 C)  98.8 F (37.1 C) 98.6 F (37 C)  TempSrc:  Oral  Oral Oral  SpO2: 93% 90% 93% 93%  Weight:      Height:        Intake/Output Summary (Last 24 hours) at 05/23/2020 1006 Last data filed at 05/23/2020 0900 Gross per 24 hour  Intake 960 ml  Output 350 ml  Net 610 ml   Filed Weights   05/19/20 1839 05/20/20 0800 05/22/20 1700  Weight: 70.3 kg 70.3 kg 70.3 kg    Examination:  Constitutional: Quite short of breath as she arrived to the bedside chair Eyes: No scleral icterus seen ENMT: Moist mucous membranes Neck: normal, supple Respiratory: Diffuse bibasilar rhonchi, no wheezing, tachypneic Cardiovascular: Regular rate and rhythm, no murmurs, no edema Abdomen: Soft,  nontender, nondistended, bowel sounds positive Musculoskeletal: no clubbing / cyanosis.  Skin: No rashes seen Neurologic: Grossly nonfocal, ambulatory   Data Reviewed: I have independently reviewed following labs and imaging studies   CBC: Recent Labs  Lab 05/19/20 1944 05/21/20 0333 05/22/20 0527 05/23/20 0309  WBC 5.7 4.1 4.8 8.1  NEUTROABS  --  3.1 3.9 6.8  HGB 12.7 12.1 11.5* 11.8*  HCT 40.7 39.0 37.1 36.7  MCV 94.9 95.4 95.9 94.8  PLT 251 285 344 353   Basic Metabolic Panel: Recent Labs  Lab 05/19/20 1944 05/21/20 0333 05/22/20 0527 05/23/20 0309  NA 136 139 140 140  K 3.7 4.0 4.3 5.0  CL 102 104 106 105  CO2 24 22 23 23   GLUCOSE 101* 100* 146* 129*  BUN 16 18 22* 21*  CREATININE 0.89 0.80 0.79 0.86  CALCIUM 8.8* 9.3 9.1 9.3  MG  --  2.2 2.0 2.2  PHOS  --  3.9 3.8 3.7   GFR: Estimated Creatinine Clearance: 63.9 mL/min (by C-G formula based on SCr of 0.86 mg/dL). Liver Function Tests: Recent Labs  Lab 05/21/20 0333 05/22/20 0527 05/23/20 0309  AST 25 28 29   ALT 17 21 20   ALKPHOS 56 53 53  BILITOT 0.6 0.2* 0.5  PROT 6.8 6.2* 6.4*  ALBUMIN 2.8* 2.7* 2.7*   No results for input(s): LIPASE, AMYLASE in the last 168 hours. No results for input(s): AMMONIA in the last 168 hours. Coagulation Profile: No results for input(s): INR, PROTIME in the last 168 hours. Cardiac Enzymes: No results for input(s): CKTOTAL, CKMB, CKMBINDEX, TROPONINI in the last 168 hours. BNP (last 3 results) No results for input(s): PROBNP in the last 8760 hours. HbA1C: No results for input(s): HGBA1C in the last 72 hours. CBG: No results for input(s): GLUCAP in the last 168 hours. Lipid Profile: No results for input(s): CHOL, HDL, LDLCALC, TRIG, CHOLHDL, LDLDIRECT in the last 72 hours. Thyroid Function Tests: No results for input(s): TSH, T4TOTAL, FREET4, T3FREE, THYROIDAB in the last 72 hours. Anemia Panel: Recent Labs    05/21/20 0333  FERRITIN 186   Urine analysis: No  results found for: COLORURINE, APPEARANCEUR, LABSPEC, PHURINE, GLUCOSEU, HGBUR, BILIRUBINUR, KETONESUR, PROTEINUR, UROBILINOGEN, NITRITE, LEUKOCYTESUR Sepsis Labs: Invalid input(s): PROCALCITONIN, LACTICIDVEN  Recent Results (from the past 240 hour(s))  Blood Culture (routine x 2)     Status: None (Preliminary result)   Collection Time: 05/20/20  8:11 AM   Specimen: BLOOD  Result Value Ref Range Status   Specimen Description BLOOD RIGHT ANTECUBITAL  Final   Special Requests   Final    BOTTLES DRAWN AEROBIC AND ANAEROBIC Blood Culture adequate volume   Culture   Final    NO GROWTH 3 DAYS Performed at [facility], [address]  [place], [place] [address]    Report Status PENDING  Incomplete  Blood Culture (routine x 2)     Status: None (Preliminary result)   Collection Time: 05/20/20  8:16 AM   Specimen: BLOOD LEFT HAND  Result Value Ref Range Status   Specimen Description BLOOD LEFT HAND  Final   Special Requests   Final    BOTTLES DRAWN AEROBIC AND ANAEROBIC Blood Culture results may not be optimal due to an inadequate volume of blood received in culture bottles   Culture   Final    NO GROWTH 3 DAYS Performed at [facility], [address] [place], [place] [address]    Report Status PENDING  Incomplete      Radiology Studies: No results found.  Pamella Pert, MD, PhD [facility]  Between 7 am - 7 pm I am available, please contact me via Amion or Securechat  Between 7 pm - 7 am I am not available, please contact night coverage MD/APP via Amion

## 2020-05-24 LAB — PHOSPHORUS: Phosphorus: 3.8 mg/dL (ref 2.5–4.6)

## 2020-05-24 LAB — COMPREHENSIVE METABOLIC PANEL
ALT: 17 U/L (ref 0–44)
AST: 18 U/L (ref 15–41)
Albumin: 2.8 g/dL — ABNORMAL LOW (ref 3.5–5.0)
Alkaline Phosphatase: 56 U/L (ref 38–126)
Anion gap: 11 (ref 5–15)
BUN: 19 mg/dL (ref 6–20)
CO2: 25 mmol/L (ref 22–32)
Calcium: 9.4 mg/dL (ref 8.9–10.3)
Chloride: 103 mmol/L (ref 98–111)
Creatinine, Ser: 0.88 mg/dL (ref 0.44–1.00)
GFR calc Af Amer: >60 mL/min (ref >60)
GFR calc non Af Amer: >60 mL/min (ref >60)
Glucose, Bld: 133 mg/dL — ABNORMAL HIGH (ref 70–99)
Potassium: 4.4 mmol/L (ref 3.5–5.1)
Sodium: 139 mmol/L (ref 135–145)
Total Bilirubin: 0.5 mg/dL (ref 0.3–1.2)
Total Protein: 6.3 g/dL — ABNORMAL LOW (ref 6.5–8.1)

## 2020-05-24 LAB — CBC WITH DIFFERENTIAL/PLATELET
Abs Immature Granulocytes: 0.61 10*3/uL — ABNORMAL HIGH (ref 0.00–0.07)
Basophils Absolute: 0.1 10*3/uL (ref 0.0–0.1)
Basophils Relative: 1 %
Eosinophils Absolute: 0 10*3/uL (ref 0.0–0.5)
Eosinophils Relative: 0 %
HCT: 36 % (ref 36.0–46.0)
Hemoglobin: 11.5 g/dL — ABNORMAL LOW (ref 12.0–15.0)
Immature Granulocytes: 6 %
Lymphocytes Relative: 7 %
Lymphs Abs: 0.7 10*3/uL (ref 0.7–4.0)
MCH: 30.4 pg (ref 26.0–34.0)
MCHC: 31.9 g/dL (ref 30.0–36.0)
MCV: 95.2 fL (ref 80.0–100.0)
Monocytes Absolute: 0.3 10*3/uL (ref 0.1–1.0)
Monocytes Relative: 3 %
Neutro Abs: 8 10*3/uL — ABNORMAL HIGH (ref 1.7–7.7)
Neutrophils Relative %: 83 %
Platelets: 415 10*3/uL — ABNORMAL HIGH (ref 150–400)
RBC: 3.78 MIL/uL — ABNORMAL LOW (ref 3.87–5.11)
RDW: 13.2 % (ref 11.5–15.5)
WBC: 9.6 10*3/uL (ref 4.0–10.5)
nRBC: 0 % (ref 0.0–0.2)

## 2020-05-24 LAB — D-DIMER, QUANTITATIVE: D-Dimer, Quant: 0.66 ug/mL-FEU — ABNORMAL HIGH (ref 0.00–0.50)

## 2020-05-24 LAB — MAGNESIUM: Magnesium: 1.9 mg/dL (ref 1.7–2.4)

## 2020-05-24 LAB — C-REACTIVE PROTEIN: CRP: 2.2 mg/dL — ABNORMAL HIGH (ref <1.0)

## 2020-05-24 NOTE — Progress Notes (Signed)
PROGRESS NOTE  Julia [name] MLY:[id number] DOB: December 29, 1960 DOA: 05/19/2020 PCP: Gwenlyn Fudge, FNP   LOS: 4 days   Brief Narrative / Interim history: 59 year old female with history of Paget disease, hyperlipidemia, anxiety, depression, came in with shortness of breath and cough for the past 10 days.  She was initially diagnosed with Covid on 8/11, and has been having progressive malaise, headache, intermittent fevers, loss of taste and eventually came to the hospital due to shortness of breath.  She is unvaccinated.  Completed Remdesivir Tuesday 8/24 however remains fatigued and quite symptomatic with significant dyspnea  Subjective / 24h Interval events: Down to 3 L this morning from 5 yesterday, still feeling quite short of breath with minimal activity.  No abdominal pain, no nausea or vomiting  Assessment & Plan:  Principal Problem Acute Hypoxic Respiratory Failure due to Covid-19 Viral Illness -Completed 5 days of remdesivir on 8/24 -Patient started on steroids, continue -Continue baricitinib for 14 days or up until discharge -OK with mild hypoxemia, goal at rest is > 85% SaO2, with movement ideally > 75% -encouraged the patient to sit up in chair in the daytime use I-S and flutter valve for pulmonary toiletry and then prone in bed when at night. -Wean off oxygen as tolerated, suspect she may need to go home with O2   COVID-19 Labs  Recent Labs    05/22/20 0527 05/23/20 0309 05/24/20 0546  DDIMER 0.84* 0.67* 0.66*  CRP 3.6* 1.7* 2.2*    Lab Results  Component Value Date   SARSCOV2NAA Detected (A) 05/11/2020    Active Problems Anxiety/depression-continue home medications with Wellbutrin, Zoloft  Hyperlipidemia-continue statin  GERD-continue PPI  History of Paget's disease-outpatient management  Scheduled Meds: . albuterol  2 puff Inhalation Q6H  . vitamin C  500 mg Oral Daily  . atorvastatin  20 mg Oral QPM  . baricitinib  4 mg Oral Daily  . buPROPion   300 mg Oral Daily  . enoxaparin (LOVENOX) injection  40 mg Subcutaneous Daily  . gabapentin  300 mg Oral BID  . methylPREDNISolone (SOLU-MEDROL) injection  35 mg Intravenous Q12H  . pantoprazole  40 mg Oral Daily  . sertraline  200 mg Oral Daily  . traZODone  50 mg Oral QHS  . zinc sulfate  220 mg Oral Daily   Continuous Infusions:  PRN Meds:.acetaminophen, chlorpheniramine-HYDROcodone, guaiFENesin-dextromethorphan, ondansetron **OR** ondansetron (ZOFRAN) IV  DVT prophylaxis: Lovenox Code Status: Full code Family Communication: no family at bedside, discussed with husband over the phone  Status is: Inpatient  Remains inpatient appropriate because:Inpatient level of care appropriate due to severity of illness   Dispo: The patient is from: Home              Anticipated d/c is to: Home              Anticipated d/c date is:2 - 3 days              Patient currently is not medically stable to d/c.  Consultants:  none  Procedures:  None   Microbiology: None   Antibacterials: None    Objective: Vitals:   05/23/20 1433 05/23/20 2036 05/24/20 0533 05/24/20 0710  BP:  113/82 105/72   Pulse:  85 66   Resp:  20 17   Temp:  98.6 F (37 C) 98.4 F (36.9 C)   TempSrc:  Oral Oral   SpO2: 94% 91% 93% 95%  Weight:      Height:  Intake/Output Summary (Last 24 hours) at 05/24/2020 0940 Last data filed at 05/24/2020 0908 Gross per 24 hour  Intake 1120 ml  Output --  Net 1120 ml   Filed Weights   05/19/20 1839 05/20/20 0800 05/22/20 1700  Weight: 70.3 kg 70.3 kg 70.3 kg    Examination:  Constitutional: Appears tachypneic Eyes: No scleral icterus ENMT: Moist mucous membranes Neck: normal, supple Respiratory: Diffuse bibasilar rhonchi, no wheezing, tachypneic Cardiovascular: Regular rate and rhythm, no murmurs appreciated.  No peripheral edema Abdomen: Soft, NT, ND, bowel sounds positive Musculoskeletal: no clubbing / cyanosis.  Skin: No rashes  appreciated Neurologic: No focal deficits   Data Reviewed: I have independently reviewed following labs and imaging studies   CBC: Recent Labs  Lab 05/19/20 1944 05/21/20 0333 05/22/20 0527 05/23/20 0309 05/24/20 0546  WBC 5.7 4.1 4.8 8.1 9.6  NEUTROABS  --  3.1 3.9 6.8 PENDING  HGB 12.7 12.1 11.5* 11.8* 11.5*  HCT 40.7 39.0 37.1 36.7 36.0  MCV 94.9 95.4 95.9 94.8 95.2  PLT 251 285 344 353 415*   Basic Metabolic Panel: Recent Labs  Lab 05/19/20 1944 05/21/20 0333 05/22/20 0527 05/23/20 0309 05/24/20 0546  NA 136 139 140 140 139  K 3.7 4.0 4.3 5.0 4.4  CL 102 104 106 105 103  CO2 24 22 23 23 25   GLUCOSE 101* 100* 146* 129* 133*  BUN 16 18 22* 21* 19  CREATININE 0.89 0.80 0.79 0.86 0.88  CALCIUM 8.8* 9.3 9.1 9.3 9.4  MG  --  2.2 2.0 2.2 1.9  PHOS  --  3.9 3.8 3.7 3.8   GFR: Estimated Creatinine Clearance: 62.5 mL/min (by C-G formula based on SCr of 0.88 mg/dL). Liver Function Tests: Recent Labs  Lab 05/21/20 0333 05/22/20 0527 05/23/20 0309 05/24/20 0546  AST 25 28 29 18   ALT 17 21 20 17   ALKPHOS 56 53 53 56  BILITOT 0.6 0.2* 0.5 0.5  PROT 6.8 6.2* 6.4* 6.3*  ALBUMIN 2.8* 2.7* 2.7* 2.8*   No results for input(s): LIPASE, AMYLASE in the last 168 hours. No results for input(s): AMMONIA in the last 168 hours. Coagulation Profile: No results for input(s): INR, PROTIME in the last 168 hours. Cardiac Enzymes: No results for input(s): CKTOTAL, CKMB, CKMBINDEX, TROPONINI in the last 168 hours. BNP (last 3 results) No results for input(s): PROBNP in the last 8760 hours. HbA1C: No results for input(s): HGBA1C in the last 72 hours. CBG: No results for input(s): GLUCAP in the last 168 hours. Lipid Profile: No results for input(s): CHOL, HDL, LDLCALC, TRIG, CHOLHDL, LDLDIRECT in the last 72 hours. Thyroid Function Tests: No results for input(s): TSH, T4TOTAL, FREET4, T3FREE, THYROIDAB in the last 72 hours. Anemia Panel: No results for input(s): VITAMINB12,  FOLATE, FERRITIN, TIBC, IRON, RETICCTPCT in the last 72 hours. Urine analysis: No results found for: COLORURINE, APPEARANCEUR, LABSPEC, PHURINE, GLUCOSEU, HGBUR, BILIRUBINUR, KETONESUR, PROTEINUR, UROBILINOGEN, NITRITE, LEUKOCYTESUR Sepsis Labs: Invalid input(s): PROCALCITONIN, LACTICIDVEN  Recent Results (from the past 240 hour(s))  Blood Culture (routine x 2)     Status: None (Preliminary result)   Collection Time: 05/20/20  8:11 AM   Specimen: BLOOD  Result Value Ref Range Status   Specimen Description BLOOD RIGHT ANTECUBITAL  Final   Special Requests   Final    BOTTLES DRAWN AEROBIC AND ANAEROBIC Blood Culture adequate volume   Culture   Final    NO GROWTH 4 DAYS Performed at [facility], [address] [place], [facility]  [id number]    Report Status PENDING  Incomplete  Blood Culture (routine x 2)     Status: None (Preliminary result)   Collection Time: 05/20/20  8:16 AM   Specimen: BLOOD LEFT HAND  Result Value Ref Range Status   Specimen Description BLOOD LEFT HAND  Final   Special Requests   Final    BOTTLES DRAWN AEROBIC AND ANAEROBIC Blood Culture results may not be optimal due to an inadequate volume of blood received in culture bottles   Culture   Final    NO GROWTH 4 DAYS Performed at [facility], [address] [place], [place] [address]    Report Status PENDING  Incomplete      Radiology Studies: No results found.  Pamella Pert, MD, PhD [facility]  Between 7 am - 7 pm I am available, please contact me via Amion or Securechat  Between 7 pm - 7 am I am not available, please contact night coverage MD/APP via Amion

## 2020-05-25 LAB — CULTURE, BLOOD (ROUTINE X 2)
Culture: NO GROWTH
Culture: NO GROWTH
Special Requests: ADEQUATE

## 2020-05-25 LAB — CBC WITH DIFFERENTIAL/PLATELET
Abs Immature Granulocytes: 0.91 10*3/uL — ABNORMAL HIGH (ref 0.00–0.07)
Basophils Absolute: 0.1 10*3/uL (ref 0.0–0.1)
Basophils Relative: 1 %
Eosinophils Absolute: 0 10*3/uL (ref 0.0–0.5)
Eosinophils Relative: 0 %
HCT: 38.6 % (ref 36.0–46.0)
Hemoglobin: 12.4 g/dL (ref 12.0–15.0)
Immature Granulocytes: 8 %
Lymphocytes Relative: 7 %
Lymphs Abs: 0.8 10*3/uL (ref 0.7–4.0)
MCH: 30.4 pg (ref 26.0–34.0)
MCHC: 32.1 g/dL (ref 30.0–36.0)
MCV: 94.6 fL (ref 80.0–100.0)
Monocytes Absolute: 0.4 10*3/uL (ref 0.1–1.0)
Monocytes Relative: 3 %
Neutro Abs: 9.6 10*3/uL — ABNORMAL HIGH (ref 1.7–7.7)
Neutrophils Relative %: 81 %
Platelets: 518 10*3/uL — ABNORMAL HIGH (ref 150–400)
RBC: 4.08 MIL/uL (ref 3.87–5.11)
RDW: 13.1 % (ref 11.5–15.5)
WBC: 11.6 10*3/uL — ABNORMAL HIGH (ref 4.0–10.5)
nRBC: 0 % (ref 0.0–0.2)

## 2020-05-25 LAB — COMPREHENSIVE METABOLIC PANEL
ALT: 18 U/L (ref 0–44)
AST: 18 U/L (ref 15–41)
Albumin: 3.1 g/dL — ABNORMAL LOW (ref 3.5–5.0)
Alkaline Phosphatase: 62 U/L (ref 38–126)
Anion gap: 11 (ref 5–15)
BUN: 19 mg/dL (ref 6–20)
CO2: 28 mmol/L (ref 22–32)
Calcium: 9.8 mg/dL (ref 8.9–10.3)
Chloride: 102 mmol/L (ref 98–111)
Creatinine, Ser: 0.84 mg/dL (ref 0.44–1.00)
GFR calc Af Amer: >60 mL/min (ref >60)
GFR calc non Af Amer: >60 mL/min (ref >60)
Glucose, Bld: 127 mg/dL — ABNORMAL HIGH (ref 70–99)
Potassium: 4.7 mmol/L (ref 3.5–5.1)
Sodium: 141 mmol/L (ref 135–145)
Total Bilirubin: 0.4 mg/dL (ref 0.3–1.2)
Total Protein: 6.9 g/dL (ref 6.5–8.1)

## 2020-05-25 LAB — D-DIMER, QUANTITATIVE: D-Dimer, Quant: 0.59 ug/mL-FEU — ABNORMAL HIGH (ref 0.00–0.50)

## 2020-05-25 LAB — PHOSPHORUS: Phosphorus: 4.2 mg/dL (ref 2.5–4.6)

## 2020-05-25 LAB — MAGNESIUM: Magnesium: 2.1 mg/dL (ref 1.7–2.4)

## 2020-05-25 LAB — C-REACTIVE PROTEIN: CRP: 1.5 mg/dL — ABNORMAL HIGH (ref <1.0)

## 2020-05-25 NOTE — Progress Notes (Signed)
PROGRESS NOTE    Julia [name]  SWN:[id number] DOB: 06-14-1961 DOA: 05/19/2020 PCP: Gwenlyn Fudge, FNP   Chief Complaint  Patient presents with  . Shortness of Breath    Brief Narrative:  59 year old female with history of Paget disease, hyperlipidemia, anxiety, depression, came in with shortness of breath and cough for the past 10 days.  She was initially diagnosed with Covid on 8/11, and has been having progressive malaise, headache, intermittent fevers, loss of taste and eventually came to the hospital due to shortness of breath.  She is unvaccinated.  Completed Remdesivir Tuesday 8/24 however remains fatigued and quite symptomatic with significant dyspnea  Assessment & Plan:   Principal Problem:   Pneumonia due to COVID-19 virus Active Problems:   Anxiety and depression   Esophageal reflux   Hyperlipidemia   Paget disease of bone   Acute respiratory failure with hypoxia (HCC)  Acute Hypoxic Respiratory Failure due to Covid-19 Viral Illness -Completed 5 days of remdesivir on 8/24 -Patient started on steroids, continue (8/20 - present) -Continue baricitinib for 14 days or up until discharge -OK with mild hypoxemia, goal at rest is > 85% SaO2, with movement ideally > 75% -strict I/O, daily weights - prone as able, OOB, IS, flutter - elevated inflammatory markers, improving -Wean off oxygen as tolerated, suspect she may need to go home with O2  COVID-19 Labs  Recent Labs    05/23/20 0309 05/24/20 0546 05/25/20 0652  DDIMER 0.67* 0.66* 0.59*  CRP 1.7* 2.2* 1.5*    Lab Results  Component Value Date   SARSCOV2NAA Detected (Pricilla Moehle) 05/11/2020   Anxiety/depression-continue home medications with Wellbutrin, Zoloft  Hyperlipidemia-continue statin  GERD-continue PPI  History of Paget's disease-outpatient management  DVT prophylaxis: lovenox Code Status: full  Family Communication: none at bedside - fiance on cell phone with pt Disposition:   Status is:  Inpatient  Remains inpatient appropriate because:Inpatient level of care appropriate due to severity of illness   Dispo: The patient is from: Home              Anticipated d/c is to: Home              Anticipated d/c date is: 3 days              Patient currently is not medically stable to d/c.  Consultants:   none  Procedures:  none  Antimicrobials:  Anti-infectives (From admission, onward)   Start     Dose/Rate Route Frequency Ordered Stop   05/21/20 1000  remdesivir 100 mg in sodium chloride 0.9 % 100 mL IVPB       "Followed by" Linked Group Details   100 mg 200 mL/hr over 30 Minutes Intravenous Daily 05/20/20 0857 05/24/20 0912   05/20/20 1000  remdesivir 200 mg in sodium chloride 0.9% 250 mL IVPB       "Followed by" Linked Group Details   200 mg 580 mL/hr over 30 Minutes Intravenous Once 05/20/20 0857 05/20/20 1139     Subjective: C/o SOB, improving Tired, achy, sore throat SOB with exertion  Objective: Vitals:   05/24/20 2100 05/25/20 0437 05/25/20 0800 05/25/20 1438  BP: 111/86 107/76 117/79 104/71  Pulse: 86 63 70 100  Resp: 19 19 14 20   Temp: 98.2 F (36.8 C) 97.8 F (36.6 C)  98.7 F (37.1 C)  TempSrc: Oral Oral  Oral  SpO2: 91% 95% 91% 90%  Weight:      Height:  Intake/Output Summary (Last 24 hours) at 05/25/2020 1610 Last data filed at 05/25/2020 0900 Gross per 24 hour  Intake 360 ml  Output --  Net 360 ml   Filed Weights   05/19/20 1839 05/20/20 0800 05/22/20 1700  Weight: 70.3 kg 70.3 kg 70.3 kg    Examination:  General exam: Appears calm and comfortable  Respiratory system: Clear to auscultation. Respiratory effort normal. Cardiovascular system: RRR Gastrointestinal system: Abdomen is nondistended, soft and nontender. . Central nervous system: Alert and oriented. No focal neurological deficits. Extremities: no LEE Skin: No rashes, lesions or ulcers Psychiatry: Judgement and insight appear normal. Mood & affect appropriate.      Data Reviewed: I have personally reviewed following labs and imaging studies  CBC: Recent Labs  Lab 05/21/20 0333 05/22/20 0527 05/23/20 0309 05/24/20 0546 05/25/20 0652  WBC 4.1 4.8 8.1 9.6 11.6*  NEUTROABS 3.1 3.9 6.8 8.0* 9.6*  HGB 12.1 11.5* 11.8* 11.5* 12.4  HCT 39.0 37.1 36.7 36.0 38.6  MCV 95.4 95.9 94.8 95.2 94.6  PLT 285 344 353 415* 518*    Basic Metabolic Panel: Recent Labs  Lab 05/21/20 0333 05/22/20 0527 05/23/20 0309 05/24/20 0546 05/25/20 0652  NA 139 140 140 139 141  K 4.0 4.3 5.0 4.4 4.7  CL 104 106 105 103 102  CO2 22 23 23 25 28   GLUCOSE 100* 146* 129* 133* 127*  BUN 18 22* 21* 19 19  CREATININE 0.80 0.79 0.86 0.88 0.84  CALCIUM 9.3 9.1 9.3 9.4 9.8  MG 2.2 2.0 2.2 1.9 2.1  PHOS 3.9 3.8 3.7 3.8 4.2    GFR: Estimated Creatinine Clearance: 65.5 mL/min (by C-G formula based on SCr of 0.84 mg/dL).  Liver Function Tests: Recent Labs  Lab 05/21/20 0333 05/22/20 0527 05/23/20 0309 05/24/20 0546 05/25/20 0652  AST 25 28 29 18 18   ALT 17 21 20 17 18   ALKPHOS 56 53 53 56 62  BILITOT 0.6 0.2* 0.5 0.5 0.4  PROT 6.8 6.2* 6.4* 6.3* 6.9  ALBUMIN 2.8* 2.7* 2.7* 2.8* 3.1*    CBG: No results for input(s): GLUCAP in the last 168 hours.   Recent Results (from the past 240 hour(s))  Blood Culture (routine x 2)     Status: None   Collection Time: 05/20/20  8:11 AM   Specimen: BLOOD  Result Value Ref Range Status   Specimen Description BLOOD RIGHT ANTECUBITAL  Final   Special Requests   Final    BOTTLES DRAWN AEROBIC AND ANAEROBIC Blood Culture adequate volume   Culture   Final    NO GROWTH 5 DAYS Performed at [facility], [address] [place], [facility] [address]    Report Status 05/25/2020 FINAL  Final  Blood Culture (routine x 2)     Status: None   Collection Time: 05/20/20  8:16 AM   Specimen: BLOOD LEFT HAND  Result Value Ref Range Status   Specimen Description BLOOD LEFT HAND  Final   Special Requests   Final    BOTTLES  DRAWN AEROBIC AND ANAEROBIC Blood Culture results may not be optimal due to an inadequate volume of blood received in culture bottles   Culture   Final    NO GROWTH 5 DAYS Performed at [facility], [address], [place], [facility] [address]    Report Status 05/25/2020 FINAL  Final         Radiology Studies: No results found.      Scheduled Meds: . albuterol  2 puff Inhalation Q6H  . vitamin C  500 mg Oral Daily  . atorvastatin  20 mg Oral QPM  . baricitinib  4 mg Oral Daily  . buPROPion  300 mg Oral Daily  . enoxaparin (LOVENOX) injection  40 mg Subcutaneous Daily  . gabapentin  300 mg Oral BID  . methylPREDNISolone (SOLU-MEDROL) injection  35 mg Intravenous Q12H  . pantoprazole  40 mg Oral Daily  . sertraline  200 mg Oral Daily  . traZODone  50 mg Oral QHS  . zinc sulfate  220 mg Oral Daily   Continuous Infusions:   LOS: 5 days    Time spent: over 30 min    Lacretia Nicks, MD [facility]   To contact the attending provider between 7A-7P or the covering provider during after hours 7P-7A, please log into the web site [email or web address] and access using universal [place] password for that web site. If you do not have the password, please call the hospital operator.  05/25/2020, 4:10 PM

## 2020-05-26 LAB — CBC WITH DIFFERENTIAL/PLATELET
Abs Immature Granulocytes: 1.72 10*3/uL — ABNORMAL HIGH (ref 0.00–0.07)
Basophils Absolute: 0 10*3/uL (ref 0.0–0.1)
Basophils Relative: 0 %
Eosinophils Absolute: 0 10*3/uL (ref 0.0–0.5)
Eosinophils Relative: 0 %
HCT: 40.1 % (ref 36.0–46.0)
Hemoglobin: 12.7 g/dL (ref 12.0–15.0)
Immature Granulocytes: 10 %
Lymphocytes Relative: 5 %
Lymphs Abs: 0.9 10*3/uL (ref 0.7–4.0)
MCH: 30.2 pg (ref 26.0–34.0)
MCHC: 31.7 g/dL (ref 30.0–36.0)
MCV: 95.2 fL (ref 80.0–100.0)
Monocytes Absolute: 0.5 10*3/uL (ref 0.1–1.0)
Monocytes Relative: 3 %
Neutro Abs: 14.9 10*3/uL — ABNORMAL HIGH (ref 1.7–7.7)
Neutrophils Relative %: 82 %
Platelets: 608 10*3/uL — ABNORMAL HIGH (ref 150–400)
RBC: 4.21 MIL/uL (ref 3.87–5.11)
RDW: 13 % (ref 11.5–15.5)
WBC: 18.1 10*3/uL — ABNORMAL HIGH (ref 4.0–10.5)
nRBC: 0 % (ref 0.0–0.2)

## 2020-05-26 LAB — COMPREHENSIVE METABOLIC PANEL
ALT: 20 U/L (ref 0–44)
AST: 22 U/L (ref 15–41)
Albumin: 3.2 g/dL — ABNORMAL LOW (ref 3.5–5.0)
Alkaline Phosphatase: 71 U/L (ref 38–126)
Anion gap: 13 (ref 5–15)
BUN: 22 mg/dL — ABNORMAL HIGH (ref 6–20)
CO2: 28 mmol/L (ref 22–32)
Calcium: 10.1 mg/dL (ref 8.9–10.3)
Chloride: 98 mmol/L (ref 98–111)
Creatinine, Ser: 0.93 mg/dL (ref 0.44–1.00)
GFR calc Af Amer: >60 mL/min (ref >60)
GFR calc non Af Amer: >60 mL/min (ref >60)
Glucose, Bld: 158 mg/dL — ABNORMAL HIGH (ref 70–99)
Potassium: 5.3 mmol/L — ABNORMAL HIGH (ref 3.5–5.1)
Sodium: 139 mmol/L (ref 135–145)
Total Bilirubin: 0.5 mg/dL (ref 0.3–1.2)
Total Protein: 7.1 g/dL (ref 6.5–8.1)

## 2020-05-26 LAB — POTASSIUM: Potassium: 4.1 mmol/L (ref 3.5–5.1)

## 2020-05-26 LAB — FERRITIN: Ferritin: 94 ng/mL (ref 11–307)

## 2020-05-26 LAB — PHOSPHORUS: Phosphorus: 5.3 mg/dL — ABNORMAL HIGH (ref 2.5–4.6)

## 2020-05-26 LAB — MAGNESIUM: Magnesium: 2.1 mg/dL (ref 1.7–2.4)

## 2020-05-26 LAB — C-REACTIVE PROTEIN: CRP: 0.8 mg/dL (ref <1.0)

## 2020-05-26 LAB — D-DIMER, QUANTITATIVE: D-Dimer, Quant: 0.55 ug/mL-FEU — ABNORMAL HIGH (ref 0.00–0.50)

## 2020-05-26 MED ORDER — ORAL CARE MOUTH RINSE
15.0000 mL | Freq: Two times a day (BID) | OROMUCOSAL | Status: DC
  Administered 2020-05-27 – 2020-05-29 (×5): 15 mL via OROMUCOSAL

## 2020-05-26 NOTE — Evaluation (Signed)
Physical Therapy Evaluation Patient Details Name: Julia [name] MRN: [id number] DOB: 24-Jul-1961 Today's Date: 05/26/2020   History of Present Illness  59 year old female with history of Paget disease, hyperlipidemia, anxiety, depression admitted with COVID PNA.  Clinical Impression  Pt admitted with above diagnosis. PTA pt independent, living at home with her fiance. On eval, she required supervision transfers and min guard assist ambulation 150' without AD. Frequent use of hand rail in hallway due to unsteady gait. Mobilized on 3L O2 with desat to 90%. SpO2 93% at rest on 3L. Pt will benefit from skilled PT to increase their independence and safety with mobility to allow discharge home.  PT to follow acutely. No follow up services indicated.       Follow Up Recommendations No PT follow up    Equipment Recommendations  Other (comment) (home O2)    Recommendations for Other Services       Precautions / Restrictions Precautions Precautions: Fall      Mobility  Bed Mobility Overal bed mobility: Modified Independent                Transfers Overall transfer level: Needs assistance Equipment used: None Transfers: Sit to/from [employer] Transfers Sit to Stand: Supervision Stand pivot transfers: Supervision       General transfer comment: supervision for lines/safety  Ambulation/Gait Ambulation/Gait assistance: Min guard Gait Distance (Feet): 150 Feet Assistive device: None Gait Pattern/deviations: Step-through pattern;Decreased stride length Gait velocity: decreased Gait velocity interpretation: <1.8 ft/sec, indicate of risk for recurrent falls General Gait Details: slow, guard gait; frequent use of handrail; Amb on 3L with desat to 90%  Stairs            Wheelchair Mobility    Modified Rankin (Stroke Patients Only)       Balance Overall balance assessment: Needs assistance Sitting-balance support: No upper extremity supported;Feet  supported Sitting balance-Leahy Scale: Good     Standing balance support: Single extremity supported;No upper extremity supported;During functional activity Standing balance-Leahy Scale: Fair Standing balance comment: frequent use of hand rail to stabilize balance during amb                             Pertinent Vitals/Pain Pain Assessment: No/denies pain    Home Living Family/patient expects to be discharged to:: Private residence Living Arrangements: Spouse/significant other Available Help at Discharge: Family;Available 24 hours/day Type of Home: House Home Access: Stairs to enter Entrance Stairs-Rails: Doctor, general practice of Steps: 2 Home Layout: One level Home Equipment: Walker - 2 wheels;Cane - single point Additional Comments: DME from previous knee and neck sx    Prior Function Level of Independence: Independent               Hand Dominance        Extremity/Trunk Assessment   Upper Extremity Assessment Upper Extremity Assessment: Generalized weakness    Lower Extremity Assessment Lower Extremity Assessment: Generalized weakness    Cervical / Trunk Assessment Cervical / Trunk Assessment: Other exceptions Cervical / Trunk Exceptions: h/o neck sx  Communication   Communication: No difficulties  Cognition Arousal/Alertness: Awake/alert Behavior During Therapy: WFL for tasks assessed/performed Overall Cognitive Status: Within Functional Limits for tasks assessed                                        General Comments General comments (  skin integrity, edema, etc.): Pt on 3L continuous O2. SpO2 93% at rest. Desat to 90% during amb.    Exercises     Assessment/Plan    PT Assessment Patient needs continued PT services  PT Problem List Decreased strength;Decreased activity tolerance;Decreased mobility;Cardiopulmonary status limiting activity;Decreased balance       PT Treatment Interventions Therapeutic  activities;Balance training;Gait training;Functional mobility training;Therapeutic exercise;Patient/family education    PT Goals (Current goals can be found in the Care Plan section)  Acute Rehab PT Goals Patient Stated Goal: home PT Goal Formulation: With patient Time For Goal Achievement: 06/09/20 Potential to Achieve Goals: Good    Frequency Min 3X/week   Barriers to discharge        Co-evaluation               AM-PAC PT "6 Clicks" Mobility  Outcome Measure Help needed turning from your back to your side while in a flat bed without using bedrails?: None Help needed moving from lying on your back to sitting on the side of a flat bed without using bedrails?: None Help needed moving to and from a bed to a chair (including a wheelchair)?: None Help needed standing up from a chair using your arms (e.g., wheelchair or bedside chair)?: None Help needed to walk in hospital room?: A Little Help needed climbing 3-5 steps with a railing? : A Little 6 Click Score: 22    End of Session Equipment Utilized During Treatment: Gait belt;Oxygen Activity Tolerance: Patient tolerated treatment well Patient left: in chair;with call bell/phone within reach Nurse Communication: Mobility status PT Visit Diagnosis: Unsteadiness on feet (R26.81);Muscle weakness (generalized) (M62.81)    Time: 5009-3818 PT Time Calculation (min) (ACUTE ONLY): 18 min   Charges:   PT Evaluation $PT Eval Moderate Complexity: 1 Mod          Aida Raider, PT  Office # [phone] Pager [phone]   Ilda Foil 05/26/2020, 12:27 PM

## 2020-05-26 NOTE — Progress Notes (Signed)
PROGRESS NOTE    Julia [name]  JKD:[id number] DOB: 12-26-1960 DOA: 05/19/2020 PCP: Gwenlyn Fudge, FNP   Chief Complaint  Patient presents with   Shortness of Breath    Brief Narrative:  59 year old female with history of Paget disease, hyperlipidemia, anxiety, depression, came in with shortness of breath and cough for the past 10 days.  She was initially diagnosed with Covid on 8/11, and has been having progressive malaise, headache, intermittent fevers, loss of taste and eventually came to the hospital due to shortness of breath.  She is unvaccinated.  Completed Remdesivir Tuesday 8/24 however remains fatigued and quite symptomatic with significant dyspnea  Assessment & Plan:   Principal Problem:   Pneumonia due to COVID-19 virus Active Problems:   Anxiety and depression   Esophageal reflux   Hyperlipidemia   Paget disease of bone   Acute respiratory failure with hypoxia (HCC)  Acute Hypoxic Respiratory Failure due to Covid-19 Viral Illness - currently satting in low 90's on 3 L, appears more sob today  - CXR 8/19 with patchy bilateral heterogenous lung opacities  - Completed 5 days of remdesivir on 8/24 - Patient started on steroids, continue (8/20 - present) - Continue baricitinib for 14 days or up until discharge - OK with mild hypoxemia, goal at rest is > 85% SaO2, with movement ideally > 75% - strict I/O, daily weights - prone as able, OOB, IS, flutter - elevated inflammatory markers, improving - Wean off oxygen as tolerated, suspect she may need to go home with O2  COVID-19 Labs  Recent Labs    05/24/20 0546 05/25/20 0652 05/26/20 0256  DDIMER 0.66* 0.59* 0.55*  FERRITIN  --   --  94  CRP 2.2* 1.5* 0.8    Lab Results  Component Value Date   SARSCOV2NAA Detected ([name]) 05/11/2020   Anxiety/depression-continue home medications with Wellbutrin, Zoloft  Hyperlipidemia-continue statin  GERD-continue PPI  History of Paget's disease-outpatient  management  DVT prophylaxis: lovenox Code Status: full  Family Communication: none at bedside - fiance on cell phone with pt 8/25 Disposition:   Status is: Inpatient  Remains inpatient appropriate because:Inpatient level of care appropriate due to severity of illness   Dispo: The patient is from: Home              Anticipated d/c is to: Home              Anticipated d/c date is: 3 days              Patient currently is not medically stable to d/c.  Consultants:   none  Procedures:  none  Antimicrobials:  Anti-infectives (From admission, onward)   Start     Dose/Rate Route Frequency Ordered Stop   05/21/20 1000  remdesivir 100 mg in sodium chloride 0.9 % 100 mL IVPB       "Followed by" Linked Group Details   100 mg 200 mL/hr over 30 Minutes Intravenous Daily 05/20/20 0857 05/24/20 0912   05/20/20 1000  remdesivir 200 mg in sodium chloride 0.9% 250 mL IVPB       "Followed by" Linked Group Details   200 mg 580 mL/hr over 30 Minutes Intravenous Once 05/20/20 0857 05/20/20 1139     Subjective: C/o continued SOB today, more than yesterday  Objective: Vitals:   05/25/20 1438 05/25/20 2056 05/26/20 0408 05/26/20 1423  BP: 104/71 110/81 103/75 104/76  Pulse: 100 86 65 92  Resp: 20 13 19 16   Temp: 98.7 F (37.1  C) 98.4 F (36.9 C) 97.9 F (36.6 C) 98 F (36.7 C)  TempSrc: Oral Oral Oral Oral  SpO2: 90% 92% 91% 93%  Weight:      Height:        Intake/Output Summary (Last 24 hours) at 05/26/2020 1610 Last data filed at 05/26/2020 0409 Gross per 24 hour  Intake 120 ml  Output --  Net 120 ml   Filed Weights   05/19/20 1839 05/20/20 0800 05/22/20 1700  Weight: 70.3 kg 70.3 kg 70.3 kg    Examination:  General: No acute distress. Cardiovascular: Heart sounds show Julia [name] regular rate, and rhythm.  Lungs: Clear to auscultation bilaterally, mild tachypnea Abdomen: Soft, nontender, nondistended  Neurological: Alert and oriented 3. Moves all extremities 4 . Cranial  nerves II through XII grossly intact. Skin: Warm and dry. No rashes or lesions. Extremities: No clubbing or cyanosis. No edema.   Data Reviewed: I have personally reviewed following labs and imaging studies  CBC: Recent Labs  Lab 05/22/20 0527 05/23/20 0309 05/24/20 0546 05/25/20 0652 05/26/20 0256  WBC 4.8 8.1 9.6 11.6* 18.1*  NEUTROABS 3.9 6.8 8.0* 9.6* 14.9*  HGB 11.5* 11.8* 11.5* 12.4 12.7  HCT 37.1 36.7 36.0 38.6 40.1  MCV 95.9 94.8 95.2 94.6 95.2  PLT 344 353 415* 518* 608*    Basic Metabolic Panel: Recent Labs  Lab 05/22/20 0527 05/23/20 0309 05/24/20 0546 05/25/20 0652 05/26/20 0256  NA 140 140 139 141 139  K 4.3 5.0 4.4 4.7 5.3*  CL 106 105 103 102 98  CO2 23 23 25 28 28   GLUCOSE 146* 129* 133* 127* 158*  BUN 22* 21* 19 19 22*  CREATININE 0.79 0.86 0.88 0.84 0.93  CALCIUM 9.1 9.3 9.4 9.8 10.1  MG 2.0 2.2 1.9 2.1 2.1  PHOS 3.8 3.7 3.8 4.2 5.3*    GFR: Estimated Creatinine Clearance: 59.1 mL/min (by C-G formula based on SCr of 0.93 mg/dL).  Liver Function Tests: Recent Labs  Lab 05/22/20 0527 05/23/20 0309 05/24/20 0546 05/25/20 0652 05/26/20 0256  AST 28 29 18 18 22   ALT 21 20 17 18 20   ALKPHOS 53 53 56 62 71  BILITOT 0.2* 0.5 0.5 0.4 0.5  PROT 6.2* 6.4* 6.3* 6.9 7.1  ALBUMIN 2.7* 2.7* 2.8* 3.1* 3.2*    CBG: No results for input(s): GLUCAP in the last 168 hours.   Recent Results (from the past 240 hour(s))  Blood Culture (routine x 2)     Status: None   Collection Time: 05/20/20  8:11 AM   Specimen: BLOOD  Result Value Ref Range Status   Specimen Description BLOOD RIGHT ANTECUBITAL  Final   Special Requests   Final    BOTTLES DRAWN AEROBIC AND ANAEROBIC Blood Culture adequate volume   Culture   Final    NO GROWTH 5 DAYS Performed at [facility], [address], [place], [facility] [address]    Report Status 05/25/2020 FINAL  Final  Blood Culture (routine x 2)     Status: None   Collection Time: 05/20/20  8:16 AM   Specimen:  BLOOD LEFT HAND  Result Value Ref Range Status   Specimen Description BLOOD LEFT HAND  Final   Special Requests   Final    BOTTLES DRAWN AEROBIC AND ANAEROBIC Blood Culture results may not be optimal due to an inadequate volume of blood received in culture bottles   Culture   Final    NO GROWTH 5 DAYS Performed at [facility], [address] [place], [place] [address]    Report Status 05/25/2020 FINAL  Final         Radiology Studies: No results found.      Scheduled Meds:  albuterol  2 puff Inhalation Q6H   vitamin C  500 mg Oral Daily   atorvastatin  20 mg Oral QPM   baricitinib  4 mg Oral Daily   buPROPion  300 mg Oral Daily   enoxaparin (LOVENOX) injection  40 mg Subcutaneous Daily   gabapentin  300 mg Oral BID   methylPREDNISolone (SOLU-MEDROL) injection  35 mg Intravenous Q12H   pantoprazole  40 mg Oral Daily   sertraline  200 mg Oral Daily   traZODone  50 mg Oral QHS   zinc sulfate  220 mg Oral Daily   Continuous Infusions:   LOS: 6 days    Time spent: over 30 min    Lacretia Nicks, MD [facility]   To contact the attending provider between 7A-7P or the covering provider during after hours 7P-7A, please log into the web site [email or web address] and access using universal [place] password for that web site. If you do not have the password, please call the hospital operator.  05/26/2020, 4:10 PM

## 2020-05-26 NOTE — Progress Notes (Signed)
   05/26/20 2032  Assess: MEWS Score  Temp 98.7 F (37.1 C)  BP 96/76  Pulse Rate 90  ECG Heart Rate 90  Resp 20  Level of Consciousness Alert  SpO2 91 %  O2 Device Nasal Cannula  Patient Activity (if Appropriate) In chair  O2 Flow Rate (L/min) 3 L/min  Assess: MEWS Score  MEWS Temp 0  MEWS Systolic 1  MEWS Pulse 0  MEWS RR 0  MEWS LOC 0  MEWS Score 1  MEWS Score Color Green  Assess: if the MEWS score is Yellow or Red  Were vital signs taken at a resting state? Yes  Focused Assessment No change from prior assessment  Early Detection of Sepsis Score *See Row Information* Low  MEWS guidelines implemented *See Row Information* No, other (Comment) (no acute changes)  Treat  Pain Scale 0-10  Pain Score 0  Document  Progress note created (see row info) Yes

## 2020-05-26 NOTE — Plan of Care (Signed)
  Problem: Education: Goal: Knowledge of risk factors and measures for prevention of condition will improve Outcome: Progressing   Problem: Coping: Goal: Psychosocial and spiritual needs will be supported Outcome: Progressing   Problem: Respiratory: Goal: Will maintain a patent airway Outcome: Progressing Goal: Complications related to the disease process, condition or treatment will be avoided or minimized Outcome: Progressing   Problem: Activity: Goal: Ability to tolerate increased activity will improve Outcome: Progressing   Problem: Clinical Measurements: Goal: Ability to maintain a body temperature in the normal range will improve Outcome: Progressing   Problem: Respiratory: Goal: Ability to maintain adequate ventilation will improve Outcome: Progressing Goal: Ability to maintain a clear airway will improve Outcome: Progressing   Problem: Education: Goal: Knowledge of General Education information will improve Description: Including pain rating scale, medication(s)/side effects and non-pharmacologic comfort measures Outcome: Progressing   Problem: Health Behavior/Discharge Planning: Goal: Ability to manage health-related needs will improve Outcome: Progressing   Problem: Clinical Measurements: Goal: Ability to maintain clinical measurements within normal limits will improve Outcome: Progressing Goal: Will remain free from infection Outcome: Progressing Goal: Diagnostic test results will improve Outcome: Progressing Goal: Respiratory complications will improve Outcome: Progressing Goal: Cardiovascular complication will be avoided Outcome: Progressing   Problem: Activity: Goal: Risk for activity intolerance will decrease Outcome: Progressing   Problem: Nutrition: Goal: Adequate nutrition will be maintained Outcome: Progressing   Problem: Coping: Goal: Level of anxiety will decrease Outcome: Progressing   Problem: Elimination: Goal: Will not experience  complications related to bowel motility Outcome: Progressing Goal: Will not experience complications related to urinary retention Outcome: Progressing   Problem: Pain Managment: Goal: General experience of comfort will improve Outcome: Progressing   Problem: Safety: Goal: Ability to remain free from injury will improve Outcome: Progressing   Problem: Skin Integrity: Goal: Risk for impaired skin integrity will decrease Outcome: Progressing   

## 2020-05-26 NOTE — Progress Notes (Signed)
PT Progress Note  SATURATION QUALIFICATIONS: (This note is used to comply with regulatory documentation for home oxygen)  Patient Saturations on 3L at Rest = 93%  Patient Saturations on 3 Liters of oxygen while Ambulating = 90%  Please briefly explain why patient needs home oxygen: Supplemental O2 required to maintain SpO2 at 90% during activity.  Aida Raider, PT  Office # [phone] Pager [phone]

## 2020-05-27 LAB — FERRITIN: Ferritin: 89 ng/mL (ref 11–307)

## 2020-05-27 LAB — CBC WITH DIFFERENTIAL/PLATELET
Abs Immature Granulocytes: 1.29 10*3/uL — ABNORMAL HIGH (ref 0.00–0.07)
Basophils Absolute: 0.1 10*3/uL (ref 0.0–0.1)
Basophils Relative: 1 %
Eosinophils Absolute: 0 10*3/uL (ref 0.0–0.5)
Eosinophils Relative: 0 %
HCT: 36.5 % (ref 36.0–46.0)
Hemoglobin: 11.5 g/dL — ABNORMAL LOW (ref 12.0–15.0)
Immature Granulocytes: 9 %
Lymphocytes Relative: 5 %
Lymphs Abs: 0.7 10*3/uL (ref 0.7–4.0)
MCH: 29.6 pg (ref 26.0–34.0)
MCHC: 31.5 g/dL (ref 30.0–36.0)
MCV: 93.8 fL (ref 80.0–100.0)
Monocytes Absolute: 0.4 10*3/uL (ref 0.1–1.0)
Monocytes Relative: 3 %
Neutro Abs: 12.4 10*3/uL — ABNORMAL HIGH (ref 1.7–7.7)
Neutrophils Relative %: 82 %
Platelets: 555 10*3/uL — ABNORMAL HIGH (ref 150–400)
RBC: 3.89 MIL/uL (ref 3.87–5.11)
RDW: 12.9 % (ref 11.5–15.5)
WBC: 14.9 10*3/uL — ABNORMAL HIGH (ref 4.0–10.5)
nRBC: 0 % (ref 0.0–0.2)

## 2020-05-27 LAB — D-DIMER, QUANTITATIVE: D-Dimer, Quant: 0.55 ug/mL-FEU — ABNORMAL HIGH (ref 0.00–0.50)

## 2020-05-27 LAB — BRAIN NATRIURETIC PEPTIDE: B Natriuretic Peptide: 32.3 pg/mL (ref 0.0–100.0)

## 2020-05-27 LAB — COMPREHENSIVE METABOLIC PANEL
ALT: 16 U/L (ref 0–44)
AST: 18 U/L (ref 15–41)
Albumin: 3 g/dL — ABNORMAL LOW (ref 3.5–5.0)
Alkaline Phosphatase: 59 U/L (ref 38–126)
Anion gap: 12 (ref 5–15)
BUN: 26 mg/dL — ABNORMAL HIGH (ref 6–20)
CO2: 27 mmol/L (ref 22–32)
Calcium: 9.7 mg/dL (ref 8.9–10.3)
Chloride: 99 mmol/L (ref 98–111)
Creatinine, Ser: 1.08 mg/dL — ABNORMAL HIGH (ref 0.44–1.00)
GFR calc Af Amer: >60 mL/min (ref >60)
GFR calc non Af Amer: 57 mL/min — ABNORMAL LOW (ref >60)
Glucose, Bld: 138 mg/dL — ABNORMAL HIGH (ref 70–99)
Potassium: 4.4 mmol/L (ref 3.5–5.1)
Sodium: 138 mmol/L (ref 135–145)
Total Bilirubin: 0.2 mg/dL — ABNORMAL LOW (ref 0.3–1.2)
Total Protein: 6.7 g/dL (ref 6.5–8.1)

## 2020-05-27 LAB — PHOSPHORUS: Phosphorus: 4.1 mg/dL (ref 2.5–4.6)

## 2020-05-27 LAB — C-REACTIVE PROTEIN: CRP: 0.6 mg/dL (ref <1.0)

## 2020-05-27 LAB — MAGNESIUM: Magnesium: 2.1 mg/dL (ref 1.7–2.4)

## 2020-05-27 NOTE — Progress Notes (Signed)
SATURATION QUALIFICATIONS: (This note is used to comply with regulatory documentation for home oxygen)  Patient Saturations on Room Air at Rest = 93%  Patient Saturations on Room Air while Ambulating = 86%  Patient Saturations on 5 Liters of oxygen while Ambulating = 92%  The patient ambulated approximately 126ft. While walking the patients saturation would drop but stay around 92%. Whenever the patient stopped walking her saturation would go right back up to around 95%.    Please briefly explain why patient needs home oxygen: Patient was not to maintain an oxygen saturation above 88% without oxygen.

## 2020-05-27 NOTE — TOC Initial Note (Signed)
Transition of Care [facility]) - Initial/Assessment Note    Patient Details  Name: Julia [name] MRN: [id number] Date of Birth: [birth year]  Transition of [facility]) CM/SW Contact:    Lockie Pares, RN Phone Number: 05/27/2020, 9:32 AM  Clinical Narrative:                 Patient admitted with COVID pneumonia will likely require home oxygen for a period of time. Oxygen qualifications done, need to add room air oxygen level.  Will continue to follow for any other needs, has [employer].   Expected Discharge Plan: Home/Self Care Barriers to Discharge: Continued Medical Work up   Patient Goals and CMS Choice        Expected Discharge Plan and Services Expected Discharge Plan: Home/Self Care                         DME Arranged: Oxygen DME Agency: [employer] Date DME Agency Contacted: 05/27/20 Time DME Agency Contacted: [phone] Representative spoke with at DME Agency: Oletha Cruel            Prior Living Arrangements/Services     Patient language and need for interpreter reviewed:: Yes        Need for Family Participation in Patient Care: Yes (Comment) Care giver support system in place?: Yes (comment)   Criminal Activity/Legal Involvement Pertinent to Current Situation/Hospitalization: No - Comment as needed  Activities of Daily Living Home Assistive Devices/Equipment: None ADL Screening (condition at time of admission) Patient's cognitive ability adequate to safely complete daily activities?: No Is the patient deaf or have difficulty hearing?: No Does the patient have difficulty seeing, even when wearing glasses/contacts?: No Does the patient have difficulty concentrating, remembering, or making decisions?: No Patient able to express need for assistance with ADLs?: Yes Does the patient have difficulty dressing or bathing?: No Independently performs ADLs?: Yes (appropriate for developmental age) Does the patient have difficulty walking or climbing stairs?:  Yes Weakness of Legs: Right Weakness of Arms/Hands: Left  Permission Sought/Granted                  Emotional Assessment       Orientation: : Oriented to  Time, Oriented to Place, Oriented to Self, Oriented to Situation Alcohol / Substance Use: Not Applicable Psych Involvement: No (comment)  Admission diagnosis:  Acute respiratory failure with hypoxia (HCC) [J96.01] Pneumonia due to COVID-19 virus [U07.1, J12.82] COVID-19 [U07.1] Patient Active Problem List   Diagnosis Date Noted  . Pneumonia due to COVID-19 virus 05/20/2020  . Acute respiratory failure with hypoxia (HCC) 05/20/2020  . Chronic pain 02/10/2016  . Paget disease of bone 12/28/2015  . Esophageal reflux 08/15/2015  . Hyperlipidemia 08/15/2015  . Chronic pain in shoulder 08/15/2015  . Anxiety and depression 11/25/2013  . GAD (generalized anxiety disorder) 11/25/2013  . IBS (irritable bowel syndrome) 01/02/2013   PCP:  Gwenlyn Fudge, FNP Pharmacy:   [facility] [place],  - [address] [place] [address] Phone: [phone] Fax: [phone]     Social Determinants of Health (SDOH) Interventions    Readmission Risk Interventions No flowsheet data found.

## 2020-05-27 NOTE — Progress Notes (Addendum)
PROGRESS NOTE    Julia [name]  QMG:[id number] DOB: June 23, 1961 DOA: 05/19/2020 PCP: Gwenlyn Fudge, FNP   Chief Complaint  Patient presents with  . Shortness of Breath    Brief Narrative:  59 year old female with history of Paget disease, hyperlipidemia, anxiety, depression, came in with shortness of breath and cough for the past 10 days.  She was initially diagnosed with Covid on 8/11, and has been having progressive malaise, headache, intermittent fevers, loss of taste and eventually came to the hospital due to shortness of breath.  She is unvaccinated.  Completed Remdesivir Tuesday 8/24 however remains fatigued and quite symptomatic with significant dyspnea  Assessment & Plan:   Principal Problem:   Pneumonia due to COVID-19 virus Active Problems:   Anxiety and depression   Esophageal reflux   Hyperlipidemia   Paget disease of bone   Acute respiratory failure with hypoxia (HCC)  Acute Hypoxic Respiratory Failure due to Covid-19 Viral Illness - currently satting in low 90's on 3 L, she continues to appear SOB at rest on 3 L (required more O2 today with ambulation than yesterday) - CXR 8/19 with patchy bilateral heterogenous lung opacities  - Completed 5 days of remdesivir on 8/24 - Patient started on steroids, continue (8/20 - present) - Continue baricitinib for 14 days or up until discharge - OK with mild hypoxemia, goal at rest is > 85% SaO2, with movement ideally > 75% - strict I/O, daily weights - prone as able, OOB, IS, flutter - elevated inflammatory markers, improving - Wean off oxygen as tolerated, suspect she may need to go home with O2  COVID-19 Labs  Recent Labs    05/25/20 0652 05/26/20 0256 05/27/20 0059  DDIMER 0.59* 0.55* 0.55*  FERRITIN  --  94 89  CRP 1.5* 0.8 0.6    Lab Results  Component Value Date   SARSCOV2NAA Detected (Oluwatosin Bracy) 05/11/2020   Anxiety/depression-continue home medications with Wellbutrin, Zoloft  Hyperlipidemia-continue  statin  GERD-continue PPI  History of Paget's disease-outpatient management  DVT prophylaxis: lovenox Code Status: full  Family Communication: none at bedside - called fiance 8/27, no answer Disposition:   Status is: Inpatient  Remains inpatient appropriate because:Inpatient level of care appropriate due to severity of illness   Dispo: The patient is from: Home              Anticipated d/c is to: Home              Anticipated d/c date is: 3 days              Patient currently is not medically stable to d/c.  Consultants:   none  Procedures:  none  Antimicrobials:  Anti-infectives (From admission, onward)   Start     Dose/Rate Route Frequency Ordered Stop   05/21/20 1000  remdesivir 100 mg in sodium chloride 0.9 % 100 mL IVPB       "Followed by" Linked Group Details   100 mg 200 mL/hr over 30 Minutes Intravenous Daily 05/20/20 0857 05/24/20 0912   05/20/20 1000  remdesivir 200 mg in sodium chloride 0.9% 250 mL IVPB       "Followed by" Linked Group Details   200 mg 580 mL/hr over 30 Minutes Intravenous Once 05/20/20 0857 05/20/20 1139     Subjective: No new complaints Would like to go home if possible, but still with SOB with minimal exertion   Objective: Vitals:   05/26/20 0408 05/26/20 1423 05/26/20 2032 05/27/20 0421  BP: 103/75  104/76 96/76 91/70   Pulse: 65 92 90 66  Resp: 19 16 20 17   Temp: 97.9 F (36.6 C) 98 F (36.7 C) 98.7 F (37.1 C) 98.6 F (37 C)  TempSrc: Oral Oral Oral Oral  SpO2: 91% 93% 91% 92%  Weight:    68.7 kg  Height:        Intake/Output Summary (Last 24 hours) at 05/27/2020 1520 Last data filed at 05/27/2020 0940 Gross per 24 hour  Intake 480 ml  Output --  Net 480 ml   Filed Weights   05/20/20 0800 05/22/20 1700 05/27/20 0421  Weight: 70.3 kg 70.3 kg 68.7 kg    Examination:  General: No acute distress. Cardiovascular: RRR Lungs: increased WOB, speaks in short phrases, no clear adventitious lung sounds Abdomen: Soft,  nontender, nondistended with normal active bowel sounds. No masses. No hepatosplenomegaly. Neurological: Alert and oriented 3. Moves all extremities 4. Cranial nerves II through XII grossly intact. Skin: Warm and dry. No rashes or lesions. Extremities: No clubbing or cyanosis. No edema.   Data Reviewed: I have personally reviewed following labs and imaging studies  CBC: Recent Labs  Lab 05/23/20 0309 05/24/20 0546 05/25/20 0652 05/26/20 0256 05/27/20 0059  WBC 8.1 9.6 11.6* 18.1* 14.9*  NEUTROABS 6.8 8.0* 9.6* 14.9* 12.4*  HGB 11.8* 11.5* 12.4 12.7 11.5*  HCT 36.7 36.0 38.6 40.1 36.5  MCV 94.8 95.2 94.6 95.2 93.8  PLT 353 415* 518* 608* 555*    Basic Metabolic Panel: Recent Labs  Lab 05/23/20 0309 05/23/20 0309 05/24/20 0546 05/25/20 0652 05/26/20 0256 05/26/20 1943 05/27/20 0059  NA 140  --  139 141 139  --  138  K 5.0   < > 4.4 4.7 5.3* 4.1 4.4  CL 105  --  103 102 98  --  99  CO2 23  --  25 28 28   --  27  GLUCOSE 129*  --  133* 127* 158*  --  138*  BUN 21*  --  19 19 22*  --  26*  CREATININE 0.86  --  0.88 0.84 0.93  --  1.08*  CALCIUM 9.3  --  9.4 9.8 10.1  --  9.7  MG 2.2  --  1.9 2.1 2.1  --  2.1  PHOS 3.7  --  3.8 4.2 5.3*  --  4.1   < > = values in this interval not displayed.    GFR: Estimated Creatinine Clearance: 50.4 mL/min (Nhu Glasby) (by C-G formula based on SCr of 1.08 mg/dL (H)).  Liver Function Tests: Recent Labs  Lab 05/23/20 0309 05/24/20 0546 05/25/20 0652 05/26/20 0256 05/27/20 0059  AST 29 18 18 22 18   ALT 20 17 18 20 16   ALKPHOS 53 56 62 71 59  BILITOT 0.5 0.5 0.4 0.5 0.2*  PROT 6.4* 6.3* 6.9 7.1 6.7  ALBUMIN 2.7* 2.8* 3.1* 3.2* 3.0*    CBG: No results for input(s): GLUCAP in the last 168 hours.   Recent Results (from the past 240 hour(s))  Blood Culture (routine x 2)     Status: None   Collection Time: 05/20/20  8:11 AM   Specimen: BLOOD  Result Value Ref Range Status   Specimen Description BLOOD RIGHT ANTECUBITAL  Final    Special Requests   Final    BOTTLES DRAWN AEROBIC AND ANAEROBIC Blood Culture adequate volume   Culture   Final    NO GROWTH 5 DAYS Performed at [facility], [address] [place], 05/22/20  [id number]    Report Status 05/25/2020 FINAL  Final  Blood Culture (routine x 2)     Status: None   Collection Time: 05/20/20  8:16 AM   Specimen: BLOOD LEFT HAND  Result Value Ref Range Status   Specimen Description BLOOD LEFT HAND  Final   Special Requests   Final    BOTTLES DRAWN AEROBIC AND ANAEROBIC Blood Culture results may not be optimal due to an inadequate volume of blood received in culture bottles   Culture   Final    NO GROWTH 5 DAYS Performed at [facility], [address], [place], [place] [address]    Report Status 05/25/2020 FINAL  Final         Radiology Studies: No results found.      Scheduled Meds: . albuterol  2 puff Inhalation Q6H  . vitamin C  500 mg Oral Daily  . atorvastatin  20 mg Oral QPM  . baricitinib  4 mg Oral Daily  . buPROPion  300 mg Oral Daily  . enoxaparin (LOVENOX) injection  40 mg Subcutaneous Daily  . gabapentin  300 mg Oral BID  . mouth rinse  15 mL Mouth Rinse BID  . methylPREDNISolone (SOLU-MEDROL) injection  35 mg Intravenous Q12H  . pantoprazole  40 mg Oral Daily  . sertraline  200 mg Oral Daily  . traZODone  50 mg Oral QHS  . zinc sulfate  220 mg Oral Daily   Continuous Infusions:   LOS: 7 days    Time spent: over 30 min    Lacretia Nicks, MD [facility]   To contact the attending provider between 7A-7P or the covering provider during after hours 7P-7A, please log into the web site [email or web address] and access using universal [place] password for that web site. If you do not have the password, please call the hospital operator.  05/27/2020, 3:20 PM

## 2020-05-28 LAB — COMPREHENSIVE METABOLIC PANEL
ALT: 17 U/L (ref 0–44)
AST: 17 U/L (ref 15–41)
Albumin: 3.1 g/dL — ABNORMAL LOW (ref 3.5–5.0)
Alkaline Phosphatase: 66 U/L (ref 38–126)
Anion gap: 12 (ref 5–15)
BUN: 24 mg/dL — ABNORMAL HIGH (ref 6–20)
CO2: 26 mmol/L (ref 22–32)
Calcium: 9.5 mg/dL (ref 8.9–10.3)
Chloride: 100 mmol/L (ref 98–111)
Creatinine, Ser: 1.11 mg/dL — ABNORMAL HIGH (ref 0.44–1.00)
GFR calc Af Amer: >60 mL/min (ref >60)
GFR calc non Af Amer: 55 mL/min — ABNORMAL LOW (ref >60)
Glucose, Bld: 124 mg/dL — ABNORMAL HIGH (ref 70–99)
Potassium: 4.1 mmol/L (ref 3.5–5.1)
Sodium: 138 mmol/L (ref 135–145)
Total Bilirubin: 0.3 mg/dL (ref 0.3–1.2)
Total Protein: 6.5 g/dL (ref 6.5–8.1)

## 2020-05-28 LAB — CBC WITH DIFFERENTIAL/PLATELET
Abs Immature Granulocytes: 1.06 10*3/uL — ABNORMAL HIGH (ref 0.00–0.07)
Basophils Absolute: 0.1 10*3/uL (ref 0.0–0.1)
Basophils Relative: 1 %
Eosinophils Absolute: 0 10*3/uL (ref 0.0–0.5)
Eosinophils Relative: 0 %
HCT: 36.5 % (ref 36.0–46.0)
Hemoglobin: 11.6 g/dL — ABNORMAL LOW (ref 12.0–15.0)
Immature Granulocytes: 7 %
Lymphocytes Relative: 6 %
Lymphs Abs: 0.9 10*3/uL (ref 0.7–4.0)
MCH: 29.7 pg (ref 26.0–34.0)
MCHC: 31.8 g/dL (ref 30.0–36.0)
MCV: 93.6 fL (ref 80.0–100.0)
Monocytes Absolute: 0.6 10*3/uL (ref 0.1–1.0)
Monocytes Relative: 4 %
Neutro Abs: 11.9 10*3/uL — ABNORMAL HIGH (ref 1.7–7.7)
Neutrophils Relative %: 82 %
Platelets: 628 10*3/uL — ABNORMAL HIGH (ref 150–400)
RBC: 3.9 MIL/uL (ref 3.87–5.11)
RDW: 13.2 % (ref 11.5–15.5)
WBC: 14.4 10*3/uL — ABNORMAL HIGH (ref 4.0–10.5)
nRBC: 0 % (ref 0.0–0.2)

## 2020-05-28 LAB — C-REACTIVE PROTEIN: CRP: 0.5 mg/dL (ref <1.0)

## 2020-05-28 LAB — MAGNESIUM: Magnesium: 2.1 mg/dL (ref 1.7–2.4)

## 2020-05-28 LAB — FERRITIN: Ferritin: 74 ng/mL (ref 11–307)

## 2020-05-28 LAB — PHOSPHORUS: Phosphorus: 3.9 mg/dL (ref 2.5–4.6)

## 2020-05-28 LAB — D-DIMER, QUANTITATIVE: D-Dimer, Quant: 0.56 ug/mL-FEU — ABNORMAL HIGH (ref 0.00–0.50)

## 2020-05-28 MED ORDER — PHENOL 1.4 % MT LIQD
1.0000 | OROMUCOSAL | Status: DC | PRN
  Administered 2020-05-29: 1 via OROMUCOSAL
  Filled 2020-05-28: qty 177

## 2020-05-28 MED ORDER — SENNOSIDES-DOCUSATE SODIUM 8.6-50 MG PO TABS
1.0000 | ORAL_TABLET | Freq: Every evening | ORAL | Status: DC | PRN
  Administered 2020-05-28: 1 via ORAL
  Filled 2020-05-28: qty 1

## 2020-05-28 MED ORDER — POLYETHYLENE GLYCOL 3350 17 G PO PACK
17.0000 g | PACK | Freq: Every day | ORAL | Status: DC | PRN
  Administered 2020-05-29: 17 g via ORAL
  Filled 2020-05-28: qty 1

## 2020-05-28 MED ORDER — BARICITINIB 2 MG PO TABS
2.0000 mg | ORAL_TABLET | Freq: Every day | ORAL | Status: DC
  Administered 2020-05-29: 2 mg via ORAL
  Filled 2020-05-28: qty 1

## 2020-05-28 NOTE — Progress Notes (Signed)
Patient O2 level at rest 96 with 3L nasal canula. O2 dropped to 90 wth ambulation. Patient has SOB with exertion. Will continue to monitor patient.

## 2020-05-28 NOTE — Plan of Care (Signed)
  Problem: Respiratory: Goal: Will maintain a patent airway Outcome: Progressing   Problem: Clinical Measurements: Goal: Ability to maintain a body temperature in the normal range will improve Outcome: Progressing   Problem: Respiratory: Goal: Ability to maintain adequate ventilation will improve Outcome: Progressing Goal: Ability to maintain a clear airway will improve Outcome: Progressing

## 2020-05-28 NOTE — Progress Notes (Signed)
SATURATION QUALIFICATIONS: (This note is used to comply with regulatory documentation for home oxygen)  Patient Saturations on Room Air at Rest = 86%  Patient Saturations on Room Air while Ambulating = 80%  Patient Saturations on 6 Liters of oxygen while Ambulating = 90% Patient ambulated 200 ft and O2 sat dropped back and fort in the mid 80's. She never maintained O2 level of 88 at 6 liters. Please briefly explain why patient needs home oxygen: Patient get SOB and doesn't maintain O2 level of 88 or above.

## 2020-05-28 NOTE — Progress Notes (Signed)
Julia [name]  OEV:[id number] DOB: [birth year] DOA: 05/19/2020 PCP: Gwenlyn Fudge, FNP   Chief Complaint  Patient presents with  . Shortness of Breath    Brief Narrative:  59 year old female with history of Paget disease, hyperlipidemia, anxiety, depression, came in with shortness of breath and cough for the past 10 days.  She was initially diagnosed with Covid on 8/11, and has been having progressive malaise, headache, intermittent fevers, loss of taste and eventually came to the hospital due to shortness of breath.  She is unvaccinated.  Completed Remdesivir Tuesday 8/24 however remains fatigued and quite symptomatic with significant dyspnea  Assessment & Plan:   Principal Problem:   Pneumonia due to COVID-19 virus Active Problems:   Anxiety and depression   Esophageal reflux   Hyperlipidemia   Paget disease of bone   Acute respiratory failure with hypoxia (HCC)  Acute Hypoxic Respiratory Failure due to Covid-19 Viral Illness - not maintaining sats with ambulation on 6 L today, currently on 3 L  - CXR 8/19 with patchy bilateral heterogenous lung opacities  - Completed 5 days of remdesivir on 8/24 - Patient started on steroids, continue (8/20 - present) - Continue baricitinib for 14 days or up until discharge - OK with mild hypoxemia, goal at rest is > 85% SaO2, with movement ideally > 75% - strict I/O, daily weights - prone as able, OOB, IS, flutter - elevated inflammatory markers, improving - Wean off oxygen as tolerated, suspect she may need to go home with O2  COVID-19 Labs  Recent Labs    05/26/20 0256 05/27/20 0059 05/28/20 0044  DDIMER 0.55* 0.55* 0.56*  FERRITIN 94 89 74  CRP 0.8 0.6 0.5    Lab Results  Component Value Date   SARSCOV2NAA Detected (Koleson Reifsteck) 05/11/2020   Anxiety/depression-continue home medications with Wellbutrin, Zoloft  Hyperlipidemia-continue statin  GERD-continue PPI  History of Paget's disease-outpatient  management  DVT prophylaxis: lovenox Code Status: full  Family Communication: none at bedside - called fiance 8/27, no answer Disposition:   Status is: Inpatient  Remains inpatient appropriate because:Inpatient level of care appropriate due to severity of illness   Dispo: The patient is from: Home              Anticipated d/c is to: Home              Anticipated d/c date is: 3 days              Patient currently is not medically stable to d/c.  Consultants:   none  Procedures:  none  Antimicrobials:  Anti-infectives (From admission, onward)   Start     Dose/Rate Route Frequency Ordered Stop   05/21/20 1000  remdesivir 100 mg in sodium chloride 0.9 % 100 mL IVPB       "Followed by" Linked Group Details   100 mg 200 mL/hr over 30 Minutes Intravenous Daily 05/20/20 0857 05/24/20 0912   05/20/20 1000  remdesivir 200 mg in sodium chloride 0.9% 250 mL IVPB       "Followed by" Linked Group Details   200 mg 580 mL/hr over 30 Minutes Intravenous Once 05/20/20 0857 05/20/20 1139     Subjective: No new complaints Breathing is about the same  Objective: Vitals:   05/27/20 2102 05/28/20 0532 05/28/20 0854 05/28/20 1501  BP: 97/72 100/67 103/84   Pulse: 97 69    Resp: 20 20    Temp: 98.4 F (36.9 C) 98.2 F (36.8  C) 97.8 F (36.6 C)   TempSrc: Oral Oral Oral Oral  SpO2: 94% 96%    Weight:      Height:        Intake/Output Summary (Last 24 hours) at 05/28/2020 1534 Last data filed at 05/28/2020 0847 Gross per 24 hour  Intake 480 ml  Output 1000 ml  Net -520 ml   Filed Weights   05/20/20 0800 05/22/20 1700 05/27/20 0421  Weight: 70.3 kg 70.3 kg 68.7 kg    Examination:  General: No acute distress. Cardiovascular: Heart sounds show Helma Argyle regular rate, and rhythm. . Lungs: mildly increased wob, CTAB Abdomen: Soft, nontender, nondistended  Neurological: Alert and oriented 3. Moves all extremities 4 . Cranial nerves II through XII grossly intact. Skin: Warm and  dry. No rashes or lesions. Extremities: No clubbing or cyanosis. No edema.   Data Reviewed: I have personally reviewed following labs and imaging studies  CBC: Recent Labs  Lab 05/24/20 0546 05/25/20 0652 05/26/20 0256 05/27/20 0059 05/28/20 0044  WBC 9.6 11.6* 18.1* 14.9* 14.4*  NEUTROABS 8.0* 9.6* 14.9* 12.4* 11.9*  HGB 11.5* 12.4 12.7 11.5* 11.6*  HCT 36.0 38.6 40.1 36.5 36.5  MCV 95.2 94.6 95.2 93.8 93.6  PLT 415* 518* 608* 555* 628*    Basic Metabolic Panel: Recent Labs  Lab 05/24/20 0546 05/24/20 0546 05/25/20 0652 05/26/20 0256 05/26/20 1943 05/27/20 0059 05/28/20 0044  NA 139  --  141 139  --  138 138  K 4.4   < > 4.7 5.3* 4.1 4.4 4.1  CL 103  --  102 98  --  99 100  CO2 25  --  28 28  --  27 26  GLUCOSE 133*  --  127* 158*  --  138* 124*  BUN 19  --  19 22*  --  26* 24*  CREATININE 0.88  --  0.84 0.93  --  1.08* 1.11*  CALCIUM 9.4  --  9.8 10.1  --  9.7 9.5  MG 1.9  --  2.1 2.1  --  2.1 2.1  PHOS 3.8  --  4.2 5.3*  --  4.1 3.9   < > = values in this interval not displayed.    GFR: Estimated Creatinine Clearance: 49 mL/min (Kseniya Grunden) (by C-G formula based on SCr of 1.11 mg/dL (H)).  Liver Function Tests: Recent Labs  Lab 05/24/20 0546 05/25/20 0652 05/26/20 0256 05/27/20 0059 05/28/20 0044  AST 18 18 22 18 17   ALT 17 18 20 16 17   ALKPHOS 56 62 71 59 66  BILITOT 0.5 0.4 0.5 0.2* 0.3  PROT 6.3* 6.9 7.1 6.7 6.5  ALBUMIN 2.8* 3.1* 3.2* 3.0* 3.1*    CBG: No results for input(s): GLUCAP in the last 168 hours.   Recent Results (from the past 240 hour(s))  Blood Culture (routine x 2)     Status: None   Collection Time: 05/20/20  8:11 AM   Specimen: BLOOD  Result Value Ref Range Status   Specimen Description BLOOD RIGHT ANTECUBITAL  Final   Special Requests   Final    BOTTLES DRAWN AEROBIC AND ANAEROBIC Blood Culture adequate volume   Culture   Final    NO GROWTH 5 DAYS Performed at [facility], [address] [place], [address] [place]      Report Status 05/25/2020 FINAL  Final  Blood Culture (routine x 2)     Status: None   Collection Time: 05/20/20  8:16 AM  Specimen: BLOOD LEFT HAND  Result Value Ref Range Status   Specimen Description BLOOD LEFT HAND  Final   Special Requests   Final    BOTTLES DRAWN AEROBIC AND ANAEROBIC Blood Culture results may not be optimal due to an inadequate volume of blood received in culture bottles   Culture   Final    NO GROWTH 5 DAYS Performed at [facility], [address] [place], [place] [address]    Report Status 05/25/2020 FINAL  Final         Radiology Studies: No results found.      Scheduled Meds: . albuterol  2 puff Inhalation Q6H  . vitamin C  500 mg Oral Daily  . atorvastatin  20 mg Oral QPM  . [START ON 05/29/2020] baricitinib  2 mg Oral Daily  . buPROPion  300 mg Oral Daily  . enoxaparin (LOVENOX) injection  40 mg Subcutaneous Daily  . gabapentin  300 mg Oral BID  . mouth rinse  15 mL Mouth Rinse BID  . methylPREDNISolone (SOLU-MEDROL) injection  35 mg Intravenous Q12H  . pantoprazole  40 mg Oral Daily  . sertraline  200 mg Oral Daily  . traZODone  50 mg Oral QHS  . zinc sulfate  220 mg Oral Daily   Continuous Infusions:   LOS: 8 days    Time spent: over 30 min    Lacretia Nicks, MD [facility]   To contact the attending provider between 7A-7P or the covering provider during after hours 7P-7A, please log into the web site [email or web address] and access using universal [place] password for that web site. If you do not have the password, please call the hospital operator.  05/28/2020, 3:34 PM

## 2020-05-29 ENCOUNTER — Inpatient Hospital Stay (HOSPITAL_COMMUNITY): Payer: Medicare HMO

## 2020-05-29 LAB — COMPREHENSIVE METABOLIC PANEL
ALT: 16 U/L (ref 0–44)
AST: 18 U/L (ref 15–41)
Albumin: 3 g/dL — ABNORMAL LOW (ref 3.5–5.0)
Alkaline Phosphatase: 59 U/L (ref 38–126)
Anion gap: 11 (ref 5–15)
BUN: 20 mg/dL (ref 6–20)
CO2: 25 mmol/L (ref 22–32)
Calcium: 9.2 mg/dL (ref 8.9–10.3)
Chloride: 102 mmol/L (ref 98–111)
Creatinine, Ser: 0.96 mg/dL (ref 0.44–1.00)
GFR calc Af Amer: >60 mL/min (ref >60)
GFR calc non Af Amer: >60 mL/min (ref >60)
Glucose, Bld: 183 mg/dL — ABNORMAL HIGH (ref 70–99)
Potassium: 4.1 mmol/L (ref 3.5–5.1)
Sodium: 138 mmol/L (ref 135–145)
Total Bilirubin: 0.5 mg/dL (ref 0.3–1.2)
Total Protein: 6.2 g/dL — ABNORMAL LOW (ref 6.5–8.1)

## 2020-05-29 LAB — CBC WITH DIFFERENTIAL/PLATELET
Abs Immature Granulocytes: 0.83 10*3/uL — ABNORMAL HIGH (ref 0.00–0.07)
Basophils Absolute: 0.1 10*3/uL (ref 0.0–0.1)
Basophils Relative: 1 %
Eosinophils Absolute: 0 10*3/uL (ref 0.0–0.5)
Eosinophils Relative: 0 %
HCT: 36.6 % (ref 36.0–46.0)
Hemoglobin: 11.7 g/dL — ABNORMAL LOW (ref 12.0–15.0)
Immature Granulocytes: 6 %
Lymphocytes Relative: 4 %
Lymphs Abs: 0.5 10*3/uL — ABNORMAL LOW (ref 0.7–4.0)
MCH: 30.3 pg (ref 26.0–34.0)
MCHC: 32 g/dL (ref 30.0–36.0)
MCV: 94.8 fL (ref 80.0–100.0)
Monocytes Absolute: 0.3 10*3/uL (ref 0.1–1.0)
Monocytes Relative: 2 %
Neutro Abs: 12.7 10*3/uL — ABNORMAL HIGH (ref 1.7–7.7)
Neutrophils Relative %: 87 %
Platelets: 664 10*3/uL — ABNORMAL HIGH (ref 150–400)
RBC: 3.86 MIL/uL — ABNORMAL LOW (ref 3.87–5.11)
RDW: 13.2 % (ref 11.5–15.5)
WBC: 14.4 10*3/uL — ABNORMAL HIGH (ref 4.0–10.5)
nRBC: 0 % (ref 0.0–0.2)

## 2020-05-29 LAB — D-DIMER, QUANTITATIVE: D-Dimer, Quant: 0.51 ug/mL-FEU — ABNORMAL HIGH (ref 0.00–0.50)

## 2020-05-29 LAB — GLUCOSE, CAPILLARY
Glucose-Capillary: 113 mg/dL — ABNORMAL HIGH (ref 70–99)
Glucose-Capillary: 104 mg/dL — ABNORMAL HIGH (ref 70–99)

## 2020-05-29 LAB — PHOSPHORUS: Phosphorus: 4.1 mg/dL (ref 2.5–4.6)

## 2020-05-29 LAB — MAGNESIUM: Magnesium: 2 mg/dL (ref 1.7–2.4)

## 2020-05-29 LAB — HEMOGLOBIN A1C
Hgb A1c MFr Bld: 6.4 % — ABNORMAL HIGH (ref 4.8–5.6)
Mean Plasma Glucose: 136.98 mg/dL

## 2020-05-29 LAB — BRAIN NATRIURETIC PEPTIDE: B Natriuretic Peptide: 36.8 pg/mL (ref 0.0–100.0)

## 2020-05-29 LAB — C-REACTIVE PROTEIN: CRP: 0.6 mg/dL (ref <1.0)

## 2020-05-29 MED ORDER — ALBUTEROL SULFATE HFA 108 (90 BASE) MCG/ACT IN AERS: 2.0000 | INHALATION_SPRAY | Freq: Four times a day (QID) | RESPIRATORY_TRACT | 2 refills | Status: DC | PRN

## 2020-05-29 MED ORDER — IOHEXOL 350 MG/ML SOLN
60.0000 mL | Freq: Once | INTRAVENOUS | Status: AC | PRN
  Administered 2020-05-29: 60 mL via INTRAVENOUS

## 2020-05-29 MED ORDER — INSULIN ASPART 100 UNIT/ML ~~LOC~~ SOLN: 0.0000 [IU] | Freq: Three times a day (TID) | SUBCUTANEOUS | Status: DC

## 2020-05-29 MED ORDER — DEXAMETHASONE 6 MG PO TABS: 6.0000 mg | ORAL_TABLET | Freq: Every day | ORAL | 0 refills | Status: AC

## 2020-05-29 NOTE — Progress Notes (Signed)
Discharge teaching complete. Meds, diet, activity, follow up appointments, quarantine and oxygen use reviewed and all questions answered. Prescriptions sent to pharmacy.

## 2020-05-29 NOTE — Progress Notes (Signed)
SATURATION QUALIFICATIONS: (This note is used to comply with regulatory documentation for home oxygen)  Patient Saturations on Room Air at Rest = 90%  Patient Saturations on Room Air while Ambulating = 87%  Patient Saturations on 2 Liters of oxygen while Ambulating = 90%  Please briefly explain why patient needs home oxygen: O2 sats drop without oxygen.

## 2020-05-29 NOTE — Progress Notes (Signed)
Patient SpO2 dropped as low as 81% on O2 2L [place] when patient up to [facility]. O2 increased to 3L [place] with quick recovery to 93%. Later, O2 decreased back to 2L [place] with SpO2 95% on O2 2L . Will continue to monitor.

## 2020-05-29 NOTE — Discharge Summary (Signed)
Physician Discharge Summary  Julia [name] Julia [name]:[id number] DOB: 07/18/1961 DOA: 05/19/2020  PCP: Julia FudgeJoyce, Britney F, FNP  Admit date: 05/19/2020 Discharge date: 05/29/2020  Time spent: 40 minutes  Recommendations for Outpatient Follow-up:  1. Follow outpatient CBC/CMP 2. Quarantine per [employer] guidelines 3. Follow with outpatient PCP for CXR in 3-4 weeks 4. Follow oxygen needs outpatient, discharged with 2 L with activity 5. A1c at discharge is 6.4, follow outpatient with PCP for prediabetes off steroids  Discharge Diagnoses:  Principal Problem:   Pneumonia due to COVID-19 virus Active Problems:   Anxiety and depression   Esophageal reflux   Hyperlipidemia   Paget disease of bone   Acute respiratory failure with hypoxia [facility](HCC)   Discharge Condition: stable  Diet recommendation: heart healthy  Filed Weights   05/20/20 0800 05/22/20 1700 05/27/20 0421  Weight: 70.3 kg 70.3 kg 68.7 kg    History of present illness:  59 year old female with history of Paget disease, hyperlipidemia, anxiety, depression, came in with shortness of breath and cough for the past 10 days. She was initially diagnosed with Covid on 8/11, and has been having progressive malaise, headache, intermittent fevers, loss of taste and eventually came to the hospital due to shortness of breath. She is unvaccinated. Completed Remdesivir Tuesday 8/24 however remains fatigued and quite symptomatic with significant dyspnea.  She was seen for COVID 19 pneumonia.  She's improved after steroids, remdesivir, and barcitinib.  She continues to have dyspnea with minimal exertion, but has been stable the past several days and is maintaining saturations with 2 L [place].  Will discharge with plans for outpatient follow up.  See below for additional details  Hospital Course:  Acute Hypoxic Respiratory Failure due to Covid-19 Viral Illness - maintaining sats with 2 L by [place] at time of discharge - CXR 8/19 with patchy bilateral  heterogenous lung opacities  - Completed 5 days of remdesivir on 8/24 - Discharge with additional 2 days steroids - s/p baricitinib - strict I/O, daily weights - prone as able, OOB, IS, flutter - elevated inflammatory markers, improving - Wean off oxygen as tolerated, suspect she may need to go home with O2  COVID-19 Labs  Recent Labs    05/27/20 0059 05/28/20 0044 05/29/20 0236  DDIMER 0.55* 0.56* 0.51*  FERRITIN 89 74  --   CRP 0.6 0.5 0.6    Lab Results  Component Value Date   SARSCOV2NAA Detected ([name]) 05/11/2020   Anxiety/depression-continue home medications with Wellbutrin, Zoloft  Hyperlipidemia-continue statin  GERD-continue PPI  History of Paget's disease-outpatient management  Prediabetes: BG worsened by steroids, follow outpatient  Procedures:  none  Consultations:  none  Discharge Exam: Vitals:   05/29/20 0533 05/29/20 1523  BP:  98/67  Pulse: 66 97  Resp: 15 17  Temp:  99.1 [name] (37.3 C)  SpO2: 94% 92%   Ready for d/c today Continues to have sob with exertion  General: No acute distress. Cardiovascular: Heart sounds show [name] regular rate, and rhythm.. Lungs: no clear adventitious lung sounds Abdomen: Soft, nontender, nondistended  Neurological: Alert and oriented 3. Moves all extremities 4. Cranial nerves II through XII grossly intact. Skin: Warm and dry. No rashes or lesions. Extremities: No clubbing or cyanosis. No edema.  Discharge Instructions   Discharge Instructions    Call MD for:  difficulty breathing, headache or visual disturbances   Complete by: As directed    Call MD for:  extreme fatigue   Complete by: As directed    Call MD  for:  hives   Complete by: As directed    Call MD for:  persistant dizziness or light-headedness   Complete by: As directed    Call MD for:  persistant nausea and vomiting   Complete by: As directed    Call MD for:  redness, tenderness, or signs of infection (pain, swelling, redness, odor or  green/yellow discharge around incision site)   Complete by: As directed    Call MD for:  severe uncontrolled pain   Complete by: As directed    Call MD for:  temperature >100.4   Complete by: As directed    Diet - low sodium heart healthy   Complete by: As directed    Discharge instructions   Complete by: As directed    You were seen for COVID 19 pneumonia.  You've improved with steroids, remdesivir, and baricitinib.  You'll need to quarantine until September 1st, you can discontinue quarantine when your symptoms have improved and you don't have to use any fever reducing meds.  If you have questions about quarantine, please talk to your PCP.  We'll send you home with oxygen, use 2 L with activity and at night (or with shortness of breath).  Please get [name] chest x ray in 3-4 weeks.  Please follow up with your PCP to determine your long term oxygen needs.  Return for new, recurrent, or worsening symptoms.  Please ask your PCP to request records from this hospitalization so they know what was done and what the next steps will be.   Increase activity slowly   Complete by: As directed      Allergies as of 05/29/2020      Reactions   Lamictal [lamotrigine] Itching, Swelling      Medication List    STOP taking these medications   meloxicam 15 MG tablet Commonly known as: MOBIC     TAKE these medications   albuterol 108 (90 Base) MCG/ACT inhaler Commonly known as: VENTOLIN HFA Inhale 2 puffs into the lungs every 6 (six) hours as needed for wheezing or shortness of breath.   atorvastatin 20 MG tablet Commonly known as: LIPITOR Take 1 tablet (20 mg total) by mouth every evening.   benzonatate 100 MG capsule Commonly known as: Tessalon Perles Take 1 capsule (100 mg total) by mouth 3 (three) times daily as needed for cough.   buPROPion 300 MG 24 hr tablet Commonly known as: WELLBUTRIN XL Take 300 mg by mouth daily.   dexamethasone 6 MG tablet Commonly known as: Decadron Take 1  tablet (6 mg total) by mouth daily for 2 days. What changed: additional instructions   gabapentin 300 MG capsule Commonly known as: NEURONTIN Take 300 mg by mouth 2 (two) times daily.   omeprazole 40 MG capsule Commonly known as: PRILOSEC Take 1 capsule (40 mg total) by mouth daily.   PROBIOTIC DAILY PO Take 1 tablet by mouth daily.   sertraline 100 MG tablet Commonly known as: ZOLOFT Take 200 mg by mouth daily.   traZODone 50 MG tablet Commonly known as: DESYREL Take 50 mg by mouth at bedtime.            Durable Medical Equipment  (From admission, onward)         Start     Ordered   05/29/20 1651  DME Oxygen  Once       Comments: SATURATION QUALIFICATIONS: (This note is used to comply with regulatory documentation for home oxygen)  Patient Saturations on Room ONEOK  at Rest = 90%  Patient Saturations on Room Air while Ambulating = 87%  Patient Saturations on 2 Liters of oxygen while Ambulating = 90%  Please briefly explain why patient needs home oxygen: O2 sats drop without oxygen.  Question Answer Comment  Length of Need 12 Months   Mode or (Route) Nasal cannula   Liters per Minute 2   Frequency Continuous (stationary and portable oxygen unit needed)   Oxygen delivery system Gas      05/29/20 1659   05/27/20 1250  For home use only DME oxygen  Once       Question Answer Comment  Length of Need 6 Months   Mode or (Route) Nasal cannula   Liters per Minute 5   Frequency Continuous (stationary and portable oxygen unit needed)   Oxygen conserving device Yes   Oxygen delivery system Gas      05/27/20 1250         Allergies  Allergen Reactions  . Lamictal [Lamotrigine] Itching and Swelling    Follow-up Information    [facility] Follow up.   Why: home oxygen Contact information: [address] [place] [address] [phone]                The results of significant diagnostics from this hospitalization  (including imaging, microbiology, ancillary and laboratory) are listed below for reference.    Significant Diagnostic Studies: CT ANGIO CHEST PE W OR WO CONTRAST  Result Date: 05/29/2020 CLINICAL DATA:  Chest pain, shortness of breath. Diagnosed with COVID-19 on 05/11/2020. EXAM: CT ANGIOGRAPHY CHEST WITH CONTRAST TECHNIQUE: Multidetector CT imaging of the chest was performed using the standard protocol during bolus administration of intravenous contrast. Multiplanar CT image reconstructions and MIPs were obtained to evaluate the vascular anatomy. CONTRAST:  13mL OMNIPAQUE IOHEXOL 350 MG/ML SOLN COMPARISON:  Chest radiograph dated 05/19/2020. FINDINGS: Cardiovascular: Satisfactory opacification of the pulmonary arteries to the segmental level. No evidence of pulmonary embolism. The heart is mildly enlarged. No pericardial effusion. Mediastinum/Nodes: No enlarged mediastinal, hilar, or axillary lymph nodes. Thyroid gland, trachea, and esophagus demonstrate no significant findings. Lungs/Pleura: Moderate to severe bilateral ground-glass opacities and consolidation are noted. There is no pleural effusion or pneumothorax. Upper Abdomen: Several hepatic cysts are noted. Musculoskeletal: No chest wall abnormality. No acute or significant osseous findings. Review of the MIP images confirms the above findings. IMPRESSION: 1. No evidence of pulmonary embolism. 2. Moderate to severe bilateral ground-glass opacities and consolidation, consistent with COVID-19 pneumonia. Electronically Signed   By: Romona Curls M.D.   On: 05/29/2020 15:26   DG Chest Portable 1 View  Result Date: 05/19/2020 CLINICAL DATA:  Hypoxia. COVID positive. Shortness of breath body aches EXAM: PORTABLE CHEST 1 VIEW COMPARISON:  12/22/2015 FINDINGS: Lung volumes are low. Patchy bilateral heterogeneous lung opacities in Rual Vermeer peripheral and basilar predominant distribution. Mild cardiomegaly with normal mediastinal contours. No pleural fluid or  pneumothorax. No acute osseous abnormalities are seen. IMPRESSION: 1. Low lung volumes. Patchy bilateral heterogeneous lung opacities in Genie Mirabal peripheral and basilar predominant distribution, pattern typical of COVID-19 pneumonia. 2. Mild cardiomegaly. Electronically Signed   By: Narda Rutherford M.D.   On: 05/19/2020 22:37    Microbiology: Recent Results (from the past 240 hour(s))  Blood Culture (routine x 2)     Status: None   Collection Time: 05/20/20  8:11 AM   Specimen: BLOOD  Result Value Ref Range Status   Specimen Description BLOOD RIGHT ANTECUBITAL  Final  Special Requests   Final    BOTTLES DRAWN AEROBIC AND ANAEROBIC Blood Culture adequate volume   Culture   Final    NO GROWTH 5 DAYS Performed at [facility], [address] [place], [place] [address]    Report Status 05/25/2020 FINAL  Final  Blood Culture (routine x 2)     Status: None   Collection Time: 05/20/20  8:16 AM   Specimen: BLOOD LEFT HAND  Result Value Ref Range Status   Specimen Description BLOOD LEFT HAND  Final   Special Requests   Final    BOTTLES DRAWN AEROBIC AND ANAEROBIC Blood Culture results may not be optimal due to an inadequate volume of blood received in culture bottles   Culture   Final    NO GROWTH 5 DAYS Performed at [facility], [address] [place], [place] [address]    Report Status 05/25/2020 FINAL  Final     Labs: Basic Metabolic Panel: Recent Labs  Lab 05/25/20 [phone] 05/25/20 0652 05/26/20 0256 05/26/20 1943 05/27/20 0059 05/28/20 0044 05/29/20 0236  NA 141  --  139  --  138 138 138  K 4.7   < > 5.3* 4.1 4.4 4.1 4.1  CL 102  --  98  --  99 100 102  CO2 28  --  28  --  27 26 25   GLUCOSE 127*  --  158*  --  138* 124* 183*  BUN 19  --  22*  --  26* 24* 20  CREATININE 0.84  --  0.93  --  1.08* 1.11* 0.96  CALCIUM 9.8  --  10.1  --  9.7 9.5 9.2  MG 2.1  --  2.1  --  2.1 2.1 2.0  PHOS 4.2  --  5.3*  --  4.1 3.9 4.1   < > = values in this interval not displayed.    Liver Function Tests: Recent Labs  Lab 05/25/20 0652 05/26/20 0256 05/27/20 0059 05/28/20 0044 05/29/20 0236  AST 18 22 18 17 18   ALT 18 20 16 17 16   ALKPHOS 62 71 59 66 59  BILITOT 0.4 0.5 0.2* 0.3 0.5  PROT 6.9 7.1 6.7 6.5 6.2*  ALBUMIN 3.1* 3.2* 3.0* 3.1* 3.0*   No results for input(s): LIPASE, AMYLASE in the last 168 hours. No results for input(s): AMMONIA in the last 168 hours. CBC: Recent Labs  Lab 05/25/20 0652 05/26/20 0256 05/27/20 0059 05/28/20 0044 05/29/20 0236  WBC 11.6* 18.1* 14.9* 14.4* 14.4*  NEUTROABS 9.6* 14.9* 12.4* 11.9* 12.7*  HGB 12.4 12.7 11.5* 11.6* 11.7*  HCT 38.6 40.1 36.5 36.5 36.6  MCV 94.6 95.2 93.8 93.6 94.8  PLT 518* 608* 555* 628* 664*   Cardiac Enzymes: No results for input(s): CKTOTAL, CKMB, CKMBINDEX, TROPONINI in the last 168 hours. BNP: BNP (last 3 results) Recent Labs    05/27/20 0059 05/29/20 0236  BNP 32.3 36.8    ProBNP (last 3 results) No results for input(s): PROBNP in the last 8760 hours.  CBG: Recent Labs  Lab 05/29/20 1206 05/29/20 1656  GLUCAP 113* 104*       Signed:  05/31/20 MD.  [facility] 05/29/2020, 5:06 PM

## 2020-05-30 DIAGNOSIS — R69 Illness, unspecified: Secondary | ICD-10-CM | POA: Diagnosis not present

## 2020-05-31 ENCOUNTER — Telehealth: Payer: Self-pay | Admitting: Family Medicine

## 2020-05-31 NOTE — Telephone Encounter (Signed)
Is this ok with you?  She has an appt scheduled for 9/28

## 2020-06-01 NOTE — Telephone Encounter (Signed)
Appt made

## 2020-06-01 NOTE — Telephone Encounter (Signed)
lmtcb

## 2020-06-01 NOTE — Telephone Encounter (Signed)
I am fine with her doing the chest x-ray that day, but she needs a hospital follow-up before than so we can make sure she is doing okay. I would say a week from discharge. This can be telephone/video visit.

## 2020-06-03 ENCOUNTER — Ambulatory Visit (INDEPENDENT_AMBULATORY_CARE_PROVIDER_SITE_OTHER): Payer: Medicare HMO | Admitting: Family Medicine

## 2020-06-03 ENCOUNTER — Encounter: Payer: Self-pay | Admitting: Family Medicine

## 2020-06-03 ENCOUNTER — Other Ambulatory Visit: Payer: Self-pay

## 2020-06-03 DIAGNOSIS — U071 COVID-19: Secondary | ICD-10-CM | POA: Diagnosis not present

## 2020-06-03 NOTE — Progress Notes (Signed)
Virtual Visit via Telephone Note  I connected with Julia [name] on 06/03/20 at 9:40 AM by telephone and verified that I am speaking with the correct person using two identifiers. Julia [name] is currently located at home and nobody is currently with her during this visit. The provider, Loman Brooklyn, FNP is located in their office at time of visit.  I discussed the limitations, risks, security and privacy concerns of performing an evaluation and management service by telephone and the availability of in person appointments. I also discussed with the patient that there may be a patient responsible charge related to this service. The patient expressed understanding and agreed to proceed.  Subjective: PCP: Loman Brooklyn, FNP  Chief Complaint  Patient presents with  . Hospitalization Follow-up   Patient is following up from a hospital stay from 05/19/2020 - 05/29/2020 at [facility] due to COVID-19.  She was initially diagnosed with COVID-19 on 05/11/2020.  Patient is on vaccinated against COVID-19.  Patient did have COVID-19 pneumonia and improved after steroids, remdesivir, and barcitinib.  She was discharged with oxygen at 2 L via nasal cannula during activity.  Her quarantine period ended on 06/01/2020.  It was recommended patient have a repeat chest x-ray in 3 to 4 weeks as well as CBC/CMP.  Her A1c at discharge was 6.4, likely due to steroid use.  Patient reports today that she is still weak and easily short winded.  She has occasional headaches and is sometimes able to cough up green sputum.  She is using Chloraseptic spray for her sore throat.  She is using Biotene spray and lozenges due to her dry mouth.  Her taste is still gone; she is not eating as much as she did before she was sick, but she is eating healthy foods.  She continues to wear oxygen with activity and while sleeping.  Her oxygen saturation is 93 to 94% with the oxygen on sitting, 90 to 91% with oxygen on with  activity, and drops down to 88% with no oxygen while sitting.  She is still using the breathing apparatus that was given to her in the hospital to help improve her lung function.  Her husband does as much as he can for her around the house but has had to return to work.  Her kids are coming over this weekend to help out around the house and with the outside yard work.   ROS: Per HPI  Current Outpatient Medications:  .  albuterol (VENTOLIN HFA) 108 (90 Base) MCG/ACT inhaler, Inhale 2 puffs into the lungs every 6 (six) hours as needed for wheezing or shortness of breath., Disp: 8 g, Rfl: 2 .  atorvastatin (LIPITOR) 20 MG tablet, Take 1 tablet (20 mg total) by mouth every evening., Disp: 90 tablet, Rfl: 3 .  benzonatate (TESSALON PERLES) 100 MG capsule, Take 1 capsule (100 mg total) by mouth 3 (three) times daily as needed for cough., Disp: 30 capsule, Rfl: 0 .  buPROPion (WELLBUTRIN XL) 300 MG 24 hr tablet, Take 300 mg by mouth daily. , Disp: , Rfl:  .  gabapentin (NEURONTIN) 300 MG capsule, Take 300 mg by mouth 2 (two) times daily., Disp: , Rfl:  .  omeprazole (PRILOSEC) 40 MG capsule, Take 1 capsule (40 mg total) by mouth daily., Disp: 90 capsule, Rfl: 3 .  Probiotic Product (PROBIOTIC DAILY PO), Take 1 tablet by mouth daily., Disp: , Rfl:  .  sertraline (ZOLOFT) 100 MG tablet, Take 200  mg by mouth daily. , Disp: , Rfl:  .  traZODone (DESYREL) 50 MG tablet, Take 50 mg by mouth at bedtime., Disp: , Rfl:   Allergies  Allergen Reactions  . Lamictal [Lamotrigine] Itching and Swelling   Past Medical History:  Diagnosis Date  . Anxiety   . Arthritis   . Complication of anesthesia    slow to wake up-does not take much medicine to sedate her  . Depression   . Esophageal reflux   . Hyperlipidemia   . Paget disease of bone   . Wears glasses     Observations/Objective: A&O  No respiratory distress or wheezing audible over the phone Mood, judgement, and thought processes all WNL  Assessment  and Plan: 1. COVID-19 - Improving slowly.  Patient encouraged to continue taking it easy.  She will continue wearing oxygen and monitoring for improvement.  Continue symptom management and spirometry.  Patient will come back next week to complete her lab work.  We will repeat her chest x-ray at her appointment that is scheduled later this month. - CMP14+EGFR; Future - CBC with Differential/Platelet; Future   Follow Up Instructions:  I discussed the assessment and treatment plan with the patient. The patient was provided an opportunity to ask questions and all were answered. The patient agreed with the plan and demonstrated an understanding of the instructions.   The patient was advised to call back or seek an in-person evaluation if the symptoms worsen or if the condition fails to improve as anticipated.  The above assessment and management plan was discussed with the patient. The patient verbalized understanding of and has agreed to the management plan. Patient is aware to call the clinic if symptoms persist or worsen. Patient is aware when to return to the clinic for a follow-up visit. Patient educated on when it is appropriate to go to the emergency department.   Time call ended: 9:53 AM  I provided 15 minutes of non-face-to-face time during this encounter.  Hendricks Limes, MSN, APRN, FNP-C [facility] 06/03/20

## 2020-06-07 ENCOUNTER — Other Ambulatory Visit: Payer: Self-pay

## 2020-06-07 NOTE — Patient Outreach (Signed)
[facility]) Care Management  06/07/2020  JELANI VREELAND 1961/05/22 [id number]   Red Emmi: Date of red emmi:   06/04/2020 Reason for alert:  Sad/hopeless/anxious/ empty-  Yes  Placed call to patient to review red emmi alert. No answer.  PLAN: will call back in 3 day.. Will mail outreach letter.  Rowe Pavy, RN, BSN, CEN [facility] [employer] [phone]

## 2020-06-09 ENCOUNTER — Other Ambulatory Visit: Payer: Self-pay

## 2020-06-09 ENCOUNTER — Other Ambulatory Visit: Payer: Medicare HMO

## 2020-06-09 DIAGNOSIS — U071 COVID-19: Secondary | ICD-10-CM

## 2020-06-10 ENCOUNTER — Other Ambulatory Visit: Payer: Self-pay

## 2020-06-10 LAB — CBC WITH DIFFERENTIAL/PLATELET
Basophils Absolute: 0 10*3/uL (ref 0.0–0.2)
Basos: 0 %
EOS (ABSOLUTE): 0.3 10*3/uL (ref 0.0–0.4)
Eos: 4 %
Hematocrit: 38 % (ref 34.0–46.6)
Hemoglobin: 11.9 g/dL (ref 11.1–15.9)
Immature Grans (Abs): 0 10*3/uL (ref 0.0–0.1)
Immature Granulocytes: 0 %
Lymphocytes Absolute: 1.2 10*3/uL (ref 0.7–3.1)
Lymphs: 16 %
MCH: 29.5 pg (ref 26.6–33.0)
MCHC: 31.3 g/dL — ABNORMAL LOW (ref 31.5–35.7)
MCV: 94 fL (ref 79–97)
Monocytes Absolute: 0.5 10*3/uL (ref 0.1–0.9)
Monocytes: 7 %
Neutrophils Absolute: 5.4 10*3/uL (ref 1.4–7.0)
Neutrophils: 73 %
Platelets: 359 10*3/uL (ref 150–450)
RBC: 4.03 x10E6/uL (ref 3.77–5.28)
RDW: 13.7 % (ref 11.7–15.4)
WBC: 7.4 10*3/uL (ref 3.4–10.8)

## 2020-06-10 LAB — CMP14+EGFR
ALT: 12 IU/L (ref 0–32)
AST: 14 IU/L (ref 0–40)
Albumin/Globulin Ratio: 1.5 (ref 1.2–2.2)
Albumin: 4 g/dL (ref 3.8–4.9)
Alkaline Phosphatase: 94 IU/L (ref 48–121)
BUN/Creatinine Ratio: 10 (ref 9–23)
BUN: 9 mg/dL (ref 6–24)
Bilirubin Total: 0.2 mg/dL (ref 0.0–1.2)
CO2: 26 mmol/L (ref 20–29)
Calcium: 9.5 mg/dL (ref 8.7–10.2)
Chloride: 105 mmol/L (ref 96–106)
Creatinine, Ser: 0.87 mg/dL (ref 0.57–1.00)
GFR calc Af Amer: 85 mL/min/{1.73_m2} (ref >59)
GFR calc non Af Amer: 74 mL/min/{1.73_m2} (ref >59)
Globulin, Total: 2.7 g/dL (ref 1.5–4.5)
Glucose: 97 mg/dL (ref 65–99)
Potassium: 4.4 mmol/L (ref 3.5–5.2)
Sodium: 143 mmol/L (ref 134–144)
Total Protein: 6.7 g/dL (ref 6.0–8.5)

## 2020-06-10 NOTE — Patient Outreach (Signed)
[facility]) Care Management  06/10/2020  Julia [name] 1961-07-20 [id number]   Red emmi:  2nd outreach attempt to patient successful.  Reviewed with patient the reason for my call. Patient reports to me that she has had depression. Reports she is active with psychiatrist and therapist. Reports she takes her medications as prescribed. Denies any changes in depression. Reviewed with patient when to call for help if she felt suicidal or homicidal and she agrees to call 911 if this happens. Denies suicidal or homicidal thoughts at this time.  Reviewed importance to call MD for changes in condition.  PLAN: no needs identified. Patient appreciative of call.  Will close case.  Rowe Pavy, RN, BSN, CEN [facility] [employer] [phone]

## 2020-06-20 ENCOUNTER — Telehealth: Payer: Self-pay | Admitting: Family Medicine

## 2020-06-20 DIAGNOSIS — E785 Hyperlipidemia, unspecified: Secondary | ICD-10-CM

## 2020-06-20 NOTE — Telephone Encounter (Signed)
Pt has apt on 06/28/2020 at 1:35 with BJ. Pt wants to come in this week for blood work so she can discuss with BJ at apt. Please have orders ready.

## 2020-06-20 NOTE — Telephone Encounter (Signed)
Patient aware and verbalized understanding. °

## 2020-06-20 NOTE — Telephone Encounter (Signed)
I ordered a lipid panel.  She needs to do this fasting.  Otherwise she just had lab work done less than 2 weeks ago so I did not reorder that.

## 2020-06-23 ENCOUNTER — Other Ambulatory Visit: Payer: Self-pay

## 2020-06-23 ENCOUNTER — Other Ambulatory Visit: Payer: Medicare HMO

## 2020-06-23 DIAGNOSIS — E785 Hyperlipidemia, unspecified: Secondary | ICD-10-CM | POA: Diagnosis not present

## 2020-06-24 LAB — LIPID PANEL
Chol/HDL Ratio: 3.3 ratio (ref 0.0–4.4)
Cholesterol, Total: 211 mg/dL — ABNORMAL HIGH (ref 100–199)
HDL: 63 mg/dL (ref >39)
LDL Chol Calc (NIH): 128 mg/dL — ABNORMAL HIGH (ref 0–99)
Triglycerides: 116 mg/dL (ref 0–149)
VLDL Cholesterol Cal: 20 mg/dL (ref 5–40)

## 2020-06-28 ENCOUNTER — Other Ambulatory Visit: Payer: Self-pay

## 2020-06-28 ENCOUNTER — Ambulatory Visit (INDEPENDENT_AMBULATORY_CARE_PROVIDER_SITE_OTHER): Payer: Medicare HMO | Admitting: Family Medicine

## 2020-06-28 ENCOUNTER — Ambulatory Visit (INDEPENDENT_AMBULATORY_CARE_PROVIDER_SITE_OTHER): Payer: Medicare HMO

## 2020-06-28 ENCOUNTER — Encounter: Payer: Self-pay | Admitting: Family Medicine

## 2020-06-28 VITALS — BP 119/84 | HR 86 | Temp 97.4°F | Ht 61.0 in | Wt 155.4 lb

## 2020-06-28 DIAGNOSIS — K219 Gastro-esophageal reflux disease without esophagitis: Secondary | ICD-10-CM | POA: Diagnosis not present

## 2020-06-28 DIAGNOSIS — U071 COVID-19: Secondary | ICD-10-CM | POA: Diagnosis not present

## 2020-06-28 DIAGNOSIS — Z8616 Personal history of COVID-19: Secondary | ICD-10-CM | POA: Diagnosis not present

## 2020-06-28 DIAGNOSIS — I459 Conduction disorder, unspecified: Secondary | ICD-10-CM | POA: Diagnosis not present

## 2020-06-28 DIAGNOSIS — Z23 Encounter for immunization: Secondary | ICD-10-CM

## 2020-06-28 DIAGNOSIS — F329 Major depressive disorder, single episode, unspecified: Secondary | ICD-10-CM

## 2020-06-28 DIAGNOSIS — F419 Anxiety disorder, unspecified: Secondary | ICD-10-CM | POA: Diagnosis not present

## 2020-06-28 DIAGNOSIS — E782 Mixed hyperlipidemia: Secondary | ICD-10-CM | POA: Diagnosis not present

## 2020-06-28 DIAGNOSIS — R69 Illness, unspecified: Secondary | ICD-10-CM | POA: Diagnosis not present

## 2020-06-28 DIAGNOSIS — J90 Pleural effusion, not elsewhere classified: Secondary | ICD-10-CM | POA: Diagnosis not present

## 2020-06-28 DIAGNOSIS — J189 Pneumonia, unspecified organism: Secondary | ICD-10-CM | POA: Diagnosis not present

## 2020-06-28 DIAGNOSIS — F32A Depression, unspecified: Secondary | ICD-10-CM

## 2020-06-28 DIAGNOSIS — J1282 Pneumonia due to coronavirus disease 2019: Secondary | ICD-10-CM | POA: Diagnosis not present

## 2020-06-28 NOTE — Progress Notes (Signed)
Assessment & Plan:  1. Mixed hyperlipidemia - Well controlled on current regimen.   2. Anxiety and depression - Well controlled on current regimen. Managed by [employer].  3. Gastroesophageal reflux disease, unspecified whether esophagitis present - Well controlled on current regimen.   4. History of COVID-19 - Patient to start weaning oxygen. Liters decreased from 2 to 1 today.  - DG Chest 2 View  5. Skipped heart beats - Ambulatory referral to Cardiology  6. Need for immunization against influenza - Flu Vaccine QUAD 36+ mos IM   Return in about 6 weeks (around 08/09/2020) for COVID f/u and 6 months for CPE.  Julia Boston, MSN, APRN, FNP-C [facility]  Subjective:    Patient ID: Julia [name], female    DOB: 07/16/1961, 59 y.o.   MRN: [id number]  Patient Care Team: Gwenlyn Fudge, FNP as PCP - General (Family Medicine) Gardiner Sleeper (Psychiatry)   Chief Complaint:  Chief Complaint  Patient presents with  . Anxiety    6 month follow up of chronic medical conditins  . Depression  . Hospitalization Follow-up    8/19- [facility]- [facility] due to COVID    HPI: Julia [name] is a 59 y.o. female presenting on 06/28/2020 for Anxiety (6 month follow up of chronic medical conditins), Depression, and Hospitalization Follow-up (8/19- [facility]- [facility] due to COVID)  Anxiety/Depression are managed by [facility].  Depression screen [facility] 2/9 06/28/2020 05/11/2020 05/03/2020  Decreased Interest 2 0 3  Down, Depressed, Hopeless 2 0 2  PHQ - 2 Score 4 0 5  Altered sleeping 2 - 3  Tired, decreased energy 3 - 3  Change in appetite 1 - 3  Feeling bad or failure about yourself  2 - 2  Trouble concentrating 1 - 2  Moving slowly or fidgety/restless 1 - 1  Suicidal thoughts 1 - 2  PHQ-9 Score 15 - 21  Difficult doing work/chores - - Somewhat difficult   GAD 7 : Generalized Anxiety Score 06/28/2020 05/03/2020 12/23/2019 06/25/2019  Nervous, Anxious, on Edge 2 3 2 3    Control/stop worrying 2 0 2 3  Worry too much - different things 2 3 2 3   Trouble relaxing 1 3 2 3   Restless 1 2 1 2   Easily annoyed or irritable 2 2 1 3   Afraid - awful might happen 2 1 1 2   Total GAD 7 Score 12 14 11 19   Anxiety Difficulty - Somewhat difficult Somewhat difficult -    Patient is still wearing oxygen at 2L via nasal cannula since having COVID-19 last month. She is monitoring at home and has not seen an oxygen saturation less than 91% even without oxygen and doing strenuous activity. She is still getting short of breath occasionally. She reports her oxygen saturation is normally around 95%.   New complaints: She is concerned that she keeps feeling her heart skipping a beat. She does get short of breath when this happens. She denies any chest pain. She reports this was going on prior to COVID-19. This does occur at least once a day, but is not occurring at the present time.    Social history:  Relevant past medical, surgical, family and social history reviewed and updated as indicated. Interim medical history since our last visit reviewed.  Allergies and medications reviewed and updated.  DATA REVIEWED: CHART IN EPIC  ROS: Negative unless specifically indicated above in HPI.    Current Outpatient Medications:  .  albuterol (VENTOLIN HFA) 108 (90  Base) MCG/ACT inhaler, Inhale 2 puffs into the lungs every 6 (six) hours as needed for wheezing or shortness of breath., Disp: 8 g, Rfl: 2 .  atorvastatin (LIPITOR) 20 MG tablet, Take 1 tablet (20 mg total) by mouth every evening., Disp: 90 tablet, Rfl: 3 .  buPROPion (WELLBUTRIN XL) 300 MG 24 hr tablet, Take 300 mg by mouth daily. , Disp: , Rfl:  .  gabapentin (NEURONTIN) 300 MG capsule, Take 300 mg by mouth 2 (two) times daily., Disp: , Rfl:  .  omeprazole (PRILOSEC) 40 MG capsule, Take 1 capsule (40 mg total) by mouth daily., Disp: 90 capsule, Rfl: 3 .  Probiotic Product (PROBIOTIC DAILY PO), Take 1 tablet by mouth daily.,  Disp: , Rfl:  .  sertraline (ZOLOFT) 100 MG tablet, Take 200 mg by mouth daily. , Disp: , Rfl:  .  traZODone (DESYREL) 50 MG tablet, Take 50 mg by mouth at bedtime., Disp: , Rfl:    Allergies  Allergen Reactions  . Lamictal [Lamotrigine] Itching and Swelling   Past Medical History:  Diagnosis Date  . Anxiety   . Arthritis   . Complication of anesthesia    slow to wake up-does not take much medicine to sedate her  . Depression   . Esophageal reflux   . Hyperlipidemia   . Paget disease of bone   . Wears glasses     Past Surgical History:  Procedure Laterality Date  . ABDOMINAL HYSTERECTOMY  8/11   TAH  . ANTERIOR FUSION CERVICAL SPINE  10/2019   C3-C5  . CERVICAL SPINE SURGERY  1/13  . DILATION AND CURETTAGE OF UTERUS  1986  . KNEE ARTHROSCOPY Left 07/22/2013   Procedure: LEFT KNEE ARTHROSCOPY WITH DEBRIDEMENT CHONDROPLASTY, REMOVAL LOOSE BODIES, PARTIAL MEDIAL MENISCECTOMY, OPEN EXCISION OF PES GANGLION;  Surgeon: [name], MD;  Location: [facility];  Service: Orthopedics;  Laterality: Left;  NO SPECIMEN SENT PER DR. [name] ORDER.  . TUBAL LIGATION  1989    Social History   Socioeconomic History  . Marital status: Divorced    Spouse name: Not on file  . Number of children: Not on file  . Years of education: Not on file  . Highest education level: Not on file  Occupational History  . Not on file  Tobacco Use  . Smoking status: Former Smoker    Quit date: 01/02/1985    Years since quitting: 35.5  . Smokeless tobacco: Never Used  Vaping Use  . Vaping Use: Never used  Substance and Sexual Activity  . Alcohol use: Yes    Alcohol/week: 1.0 standard drink    Types: 1 Glasses of wine per week    Comment: occassional  . Drug use: No  . Sexual activity: Not Currently  Other Topics Concern  . Not on file  Social History Narrative  . Not on file   Social Determinants of Health   Financial Resource Strain:   . Difficulty of Paying Living Expenses:  Not on file  Food Insecurity:   . Worried About Programme researcher, broadcasting/film/video in the Last Year: Not on file  . Ran Out of Food in the Last Year: Not on file  Transportation Needs:   . Lack of Transportation (Medical): Not on file  . Lack of Transportation (Non-Medical): Not on file  Physical Activity:   . Days of Exercise per Week: Not on file  . Minutes of Exercise per Session: Not on file  Stress:   . Feeling  of Stress : Not on file  Social Connections:   . Frequency of Communication with Friends and Family: Not on file  . Frequency of Social Gatherings with Friends and Family: Not on file  . Attends Religious Services: Not on file  . Active Member of Clubs or Organizations: Not on file  . Attends Banker Meetings: Not on file  . Marital Status: Not on file  Intimate Partner Violence:   . Fear of Current or Ex-Partner: Not on file  . Emotionally Abused: Not on file  . Physically Abused: Not on file  . Sexually Abused: Not on file        Objective:    BP 119/84   Pulse 86   Temp (!) 97.4 F (36.3 C) (Temporal)   Ht 5\' 1"  (1.549 m)   Wt 155 lb 6.4 oz (70.5 kg)   LMP 01/02/2010   SpO2 97%   BMI 29.36 kg/m   Wt Readings from Last 3 Encounters:  06/28/20 155 lb 6.4 oz (70.5 kg)  05/27/20 151 lb 7.3 oz (68.7 kg)  05/11/20 161 lb (73 kg)    Physical Exam Vitals reviewed.  Constitutional:      General: She is not in acute distress.    Appearance: Normal appearance. She is overweight. She is not ill-appearing, toxic-appearing or diaphoretic.  HENT:     Head: Normocephalic and atraumatic.  Eyes:     General: No scleral icterus.       Right eye: No discharge.        Left eye: No discharge.     Conjunctiva/sclera: Conjunctivae normal.  Cardiovascular:     Rate and Rhythm: Normal rate and regular rhythm.     Heart sounds: Normal heart sounds. No murmur heard.  No friction rub. No gallop.   Pulmonary:     Effort: Pulmonary effort is normal. No respiratory  distress.     Breath sounds: Normal breath sounds. No stridor. No wheezing, rhonchi or rales.  Musculoskeletal:        General: Normal range of motion.     Cervical back: Normal range of motion.  Skin:    General: Skin is warm and dry.     Capillary Refill: Capillary refill takes less than 2 seconds.  Neurological:     General: No focal deficit present.     Mental Status: She is alert and oriented to person, place, and time. Mental status is at baseline.  Psychiatric:        Mood and Affect: Mood normal.        Behavior: Behavior normal.        Thought Content: Thought content normal.        Judgment: Judgment normal.     Lab Results  Component Value Date   TSH 1.096 07/06/2015   Lab Results  Component Value Date   WBC 7.4 06/09/2020   HGB 11.9 06/09/2020   HCT 38.0 06/09/2020   MCV 94 06/09/2020   PLT 359 06/09/2020   Lab Results  Component Value Date   NA 143 06/09/2020   K 4.4 06/09/2020   CO2 26 06/09/2020   GLUCOSE 97 06/09/2020   BUN 9 06/09/2020   CREATININE 0.87 06/09/2020   BILITOT 0.2 06/09/2020   ALKPHOS 94 06/09/2020   AST 14 06/09/2020   ALT 12 06/09/2020   PROT 6.7 06/09/2020   ALBUMIN 4.0 06/09/2020   CALCIUM 9.5 06/09/2020   ANIONGAP 11 05/29/2020   Lab Results  Component Value  Date   CHOL 211 (H) 06/23/2020   Lab Results  Component Value Date   HDL 63 06/23/2020   Lab Results  Component Value Date   LDLCALC 128 (H) 06/23/2020   Lab Results  Component Value Date   TRIG 116 06/23/2020   Lab Results  Component Value Date   CHOLHDL 3.3 06/23/2020   Lab Results  Component Value Date   HGBA1C 6.4 (H) 05/29/2020

## 2020-07-04 ENCOUNTER — Encounter: Payer: Self-pay | Admitting: Family Medicine

## 2020-07-04 DIAGNOSIS — R69 Illness, unspecified: Secondary | ICD-10-CM | POA: Diagnosis not present

## 2020-07-06 NOTE — Progress Notes (Signed)
Cardiology Office Note:   Date:  07/07/2020  NAME:  Julia [name]    MRN: [id number] DOB:  [birth year]   PCP:  Gwenlyn Fudge, FNP  Cardiologist:  No primary care provider on file.   Referring MD: Donnetta Hail, MD   Chief Complaint  Patient presents with  . Palpitations   History of Present Illness:   Julia [name] is a 59 y.o. female with a hx of HLD, GERD, anxiety who is being seen today for the evaluation of SOB at the request of Gwenlyn Fudge, FNP. Recent covid 19 PNA. CT PE study showed minimal coronary calcium.  She reports she recently had Covid.  She was just recently discharged from the hospital.  She just recently been weaned from oxygen.  I did review her CT PE study dated 05/29/2020 which demonstrates no evidence of PE.  Did have minimal coronary calcifications.  Still has considerable groundglass opacities.  She reports that she gets short of breath when she does activity or at rest.  Apparently is limited by knee pain.  She has significant arthritis.  She reports no chest pain or pressure.  Symptoms are not improving.  She is on an inhaler.  I did instruct her that it would be a good idea to be seen in the coronavirus clinic.  She had minimal coronary calcifications but no symptoms of CAD.  Her most recent LDL cholesterol is not at goal.  128.  I recommended to increase her Crestor.  She reports that she does not feel her heart racing.  No rapid heartbeat sensation.  Her EKG today demonstrates normal sinus rhythm heart rate 78, no acute ischemic change or evidence of prior infarction.  She does have a family history of heart disease.  She smoked in the past for roughly 5 years.  No lower extremity edema.  Recent BNP value was normal.  No evidence of heart failure exam today.  Problem List 1. Anxiety 2. HLD -Tchol 211, HDL 63, LDL 128, TG 116 2. GERD  Past Medical History: Past Medical History:  Diagnosis Date  . Anxiety   . Arthritis   . Complication of  anesthesia    slow to wake up-does not take much medicine to sedate her  . Depression   . Esophageal reflux   . Hyperlipidemia   . Paget disease of bone   . Pneumonia due to COVID-19 virus 05/20/2020  . Wears glasses     Past Surgical History: Past Surgical History:  Procedure Laterality Date  . ABDOMINAL HYSTERECTOMY  8/11   TAH  . ANTERIOR FUSION CERVICAL SPINE  10/2019   C3-C5  . CERVICAL SPINE SURGERY  1/13  . DILATION AND CURETTAGE OF UTERUS  1986  . KNEE ARTHROSCOPY Left 07/22/2013   Procedure: LEFT KNEE ARTHROSCOPY WITH DEBRIDEMENT CHONDROPLASTY, REMOVAL LOOSE BODIES, PARTIAL MEDIAL MENISCECTOMY, OPEN EXCISION OF PES GANGLION;  Surgeon: [name], MD;  Location: [facility];  Service: Orthopedics;  Laterality: Left;  NO SPECIMEN SENT PER DR. [name] ORDER.  . TUBAL LIGATION  1989    Current Medications: Current Meds  Medication Sig  . atorvastatin (LIPITOR) 40 MG tablet Take 1 tablet (40 mg total) by mouth every evening.  [name] buPROPion (WELLBUTRIN XL) 300 MG 24 hr tablet Take 300 mg by mouth daily.   [name] gabapentin (NEURONTIN) 300 MG capsule Take 300 mg by mouth 2 (two) times daily.  [name] omeprazole (PRILOSEC) 40 MG capsule Take 1 capsule (40 mg  total) by mouth daily.  . Probiotic Product (PROBIOTIC DAILY PO) Take 1 tablet by mouth daily.  . sertraline (ZOLOFT) 100 MG tablet Take 200 mg by mouth daily.   . traZODone (DESYREL) 50 MG tablet Take 50 mg by mouth at bedtime.  . [DISCONTINUED] atorvastatin (LIPITOR) 20 MG tablet Take 1 tablet (20 mg total) by mouth every evening.     Allergies:    Lamictal [lamotrigine]   Social History: Social History   Socioeconomic History  . Marital status: Divorced    Spouse name: Not on file  . Number of children: Not on file  . Years of education: Not on file  . Highest education level: Not on file  Occupational History  . Not on file  Tobacco Use  . Smoking status: Former Smoker    Years: 5.00    Quit date:  01/02/1985    Years since quitting: 35.5  . Smokeless tobacco: Never Used  Vaping Use  . Vaping Use: Never used  Substance and Sexual Activity  . Alcohol use: Yes    Alcohol/week: 1.0 standard drink    Types: 1 Glasses of wine per week    Comment: occassional  . Drug use: No  . Sexual activity: Not Currently  Other Topics Concern  . Not on file  Social History Narrative  . Not on file   Social Determinants of Health   Financial Resource Strain:   . Difficulty of Paying Living Expenses: Not on file  Food Insecurity:   . Worried About Programme researcher, broadcasting/film/video in the Last Year: Not on file  . Ran Out of Food in the Last Year: Not on file  Transportation Needs:   . Lack of Transportation (Medical): Not on file  . Lack of Transportation (Non-Medical): Not on file  Physical Activity:   . Days of Exercise per Week: Not on file  . Minutes of Exercise per Session: Not on file  Stress:   . Feeling of Stress : Not on file  Social Connections:   . Frequency of Communication with Friends and Family: Not on file  . Frequency of Social Gatherings with Friends and Family: Not on file  . Attends Religious Services: Not on file  . Active Member of Clubs or Organizations: Not on file  . Attends Banker Meetings: Not on file  . Marital Status: Not on file     Family History: The patient's family history includes Congestive Heart Failure in her mother; Healthy in her brother, brother, brother, and brother; Heart disease in her father and mother; Lung cancer in her sister; Melanoma in her sister; Thyroid disease in her mother.  ROS:   All other ROS reviewed and negative. Pertinent positives noted in the HPI.     EKGs/Labs/Other Studies Reviewed:   The following studies were personally reviewed by me today:  EKG:  EKG is ordered today.  The ekg ordered today demonstrates normal sinus rhythm, heart 78, no acute ischemic changes, no evidence of prior infarction, and was personally  reviewed by me.   Recent Labs: 05/29/2020: B Natriuretic Peptide 36.8; Magnesium 2.0 06/09/2020: ALT 12; BUN 9; Creatinine, Ser 0.87; Hemoglobin 11.9; Platelets 359; Potassium 4.4; Sodium 143   Recent Lipid Panel    Component Value Date/Time   CHOL 211 (H) 06/23/2020 0841   TRIG 116 06/23/2020 0841   HDL 63 06/23/2020 0841   CHOLHDL 3.3 06/23/2020 0841   CHOLHDL 2.3 12/05/2018 0921   VLDL 19 12/05/2018 8811  LDLCALC 128 (H) 06/23/2020 0841    Physical Exam:   VS:  BP 120/78   Pulse 78   Temp (!) 95.2 F (35.1 C)   Ht 5\' 1"  (1.549 m)   Wt 155 lb (70.3 kg)   LMP 01/02/2010   SpO2 97%   BMI 29.29 kg/m    Wt Readings from Last 3 Encounters:  07/07/20 155 lb (70.3 kg)  06/28/20 155 lb 6.4 oz (70.5 kg)  05/27/20 151 lb 7.3 oz (68.7 kg)    General: Well nourished, well developed, in no acute distress Heart: Atraumatic, normal size  Eyes: PEERLA, EOMI  Neck: Supple, no JVD Endocrine: No thryomegaly Cardiac: Normal S1, S2; RRR; no murmurs, rubs, or gallops Lungs: Clear to auscultation bilaterally, no wheezing, rhonchi or rales  Abd: Soft, nontender, no hepatomegaly  Ext: No edema, pulses 2+ Musculoskeletal: No deformities, BUE and BLE strength normal and equal Skin: Warm and dry, no rashes   Neuro: Alert and oriented to person, place, time, and situation, CNII-XII grossly intact, no focal deficits  Psych: Normal mood and affect   ASSESSMENT:   SHERLONDA FLATER is a 59 y.o. female who presents for the following: 1. SOB (shortness of breath)   2. Coronary artery disease involving native coronary artery of native heart without angina pectoris   3. Mixed hyperlipidemia   4. Hyperlipidemia, unspecified hyperlipidemia type     PLAN:   1. SOB (shortness of breath) -Likely related to COVID-19.  Just came off oxygen from this.  Had significant lung findings.  BNP normal.  EKG normal.  No evidence of heart failure on exam.  No murmurs.  I suspect this is all lung related.  I  recommended referral to the COVID-19 clinic.  We will proceed with an echocardiogram just to make sure her heart is structurally normal.  I expect this to be normal. -No symptoms of angina.  I see no need for stress test.  She will see 41 as needed.  I will let her know the results of her test.  I think she does need to be seen in the COVID-19 clinic.  2. Coronary artery disease involving native coronary artery of native heart without angina pectoris 3. Mixed hyperlipidemia -Minimal coronary calcification seen on CT PE study.  No symptoms of angina.  I recommended to increase her Lipitor to 40 mg daily.  She should take a baby aspirin.  Goal LDL cholesterol is less than 70.  Disposition: Return if symptoms worsen or fail to improve.  Medication Adjustments/Labs and Tests Ordered: Current medicines are reviewed at length with the patient today.  Concerns regarding medicines are outlined above.  Orders Placed This Encounter  Procedures  . Ambulatory referral to Pulmonology  . EKG 12-Lead   Meds ordered this encounter  Medications  . atorvastatin (LIPITOR) 40 MG tablet    Sig: Take 1 tablet (40 mg total) by mouth every evening.    Dispense:  90 tablet    Refill:  1    Patient Instructions  Medication Instructions:  Increase Lipitor 40 mg daily   *If you need a refill on your cardiac medications before your next appointment, please call your pharmacy*   Testing/Procedures: Echocardiogram - Your physician has requested that you have an echocardiogram. Echocardiography is a painless test that uses sound waves to create images of your heart. It provides your doctor with information about the size and shape of your heart and how well your heart's chambers and valves are working.  This procedure takes approximately one hour. There are no restrictions for this procedure. [facility] - [address], [address].    Follow-Up: At [facility], you and  your health needs are our priority.  As part of our continuing mission to provide you with exceptional heart care, we have created designated Provider Care Teams.  These Care Teams include your primary Cardiologist (physician) and Advanced Practice Providers (APPs -  Physician Assistants and Nurse Practitioners) who all work together to provide you with the care you need, when you need it.  We recommend signing up for the patient portal called "MyChart".  Sign up information is provided on this After Visit Summary.  MyChart is used to connect with patients for Virtual Visits (Telemedicine).  Patients are able to view lab/test results, encounter notes, upcoming appointments, etc.  Non-urgent messages can be sent to your provider as well.   To learn more about what you can do with MyChart, go to [email or web address].    Your next appointment:   As needed  The format for your next appointment:   In Person  Provider:   Lennie OdorWesley O'Neal, MD   Other Instructions Referral to [facility]- Dr.Elison     Signed, Lenna GilfordWesley T. Flora Lipps'Neal, MD [facility]  [facility]  [address], [address] [place], [place] [address] [phone]  07/07/2020 4:37 PM

## 2020-07-07 ENCOUNTER — Other Ambulatory Visit: Payer: Self-pay

## 2020-07-07 ENCOUNTER — Ambulatory Visit: Payer: Medicare HMO | Admitting: Cardiovascular Disease

## 2020-07-07 ENCOUNTER — Encounter: Payer: Self-pay | Admitting: Cardiovascular Disease

## 2020-07-07 VITALS — BP 120/78 | HR 78 | Temp 95.2°F | Ht 61.0 in | Wt 155.0 lb

## 2020-07-07 DIAGNOSIS — E785 Hyperlipidemia, unspecified: Secondary | ICD-10-CM

## 2020-07-07 DIAGNOSIS — R0602 Shortness of breath: Secondary | ICD-10-CM

## 2020-07-07 DIAGNOSIS — E782 Mixed hyperlipidemia: Secondary | ICD-10-CM

## 2020-07-07 DIAGNOSIS — I2510 Atherosclerotic heart disease of native coronary artery without angina pectoris: Secondary | ICD-10-CM | POA: Diagnosis not present

## 2020-07-07 MED ORDER — ATORVASTATIN CALCIUM 40 MG PO TABS: 40.0000 mg | ORAL_TABLET | Freq: Every evening | ORAL | 1 refills | Status: DC

## 2020-07-07 NOTE — Patient Instructions (Signed)
Medication Instructions:  Increase Lipitor 40 mg daily   *If you need a refill on your cardiac medications before your next appointment, please call your pharmacy*   Testing/Procedures: Echocardiogram - Your physician has requested that you have an echocardiogram. Echocardiography is a painless test that uses sound waves to create images of your heart. It provides your doctor with information about the size and shape of your heart and how well your heart's chambers and valves are working. This procedure takes approximately one hour. There are no restrictions for this procedure. This will be performed at our [facility] location - [address].    Follow-Up: At [facility], you and your health needs are our priority.  As part of our continuing mission to provide you with exceptional heart care, we have created designated Provider Care Teams.  These Care Teams include your primary Cardiologist (physician) and Advanced Practice Providers (APPs -  Physician Assistants and Nurse Practitioners) who all work together to provide you with the care you need, when you need it.  We recommend signing up for the patient portal called "MyChart".  Sign up information is provided on this After Visit Summary.  MyChart is used to connect with patients for Virtual Visits (Telemedicine).  Patients are able to view lab/test results, encounter notes, upcoming appointments, etc.  Non-urgent messages can be sent to your provider as well.   To learn more about what you can do with MyChart, go to [email or web address].    Your next appointment:   As needed  The format for your next appointment:   In Person  Provider:   Lennie Odor, MD   Other Instructions Referral to [facility]- Dr.Elison

## 2020-07-08 ENCOUNTER — Other Ambulatory Visit: Payer: Self-pay

## 2020-07-08 ENCOUNTER — Telehealth: Payer: Self-pay | Admitting: Cardiovascular Disease

## 2020-07-08 DIAGNOSIS — R0602 Shortness of breath: Secondary | ICD-10-CM

## 2020-07-08 NOTE — Telephone Encounter (Signed)
Left message for patient regarding appointment scheduled for Monday 08/22/20 at 3:30 pm with Dr. Everardo All at [facility] [address], [address]---Phone [phone].  Information is also available in patient's My Chart.

## 2020-07-25 DIAGNOSIS — R69 Illness, unspecified: Secondary | ICD-10-CM | POA: Diagnosis not present

## 2020-07-26 ENCOUNTER — Other Ambulatory Visit: Payer: Self-pay

## 2020-07-26 ENCOUNTER — Ambulatory Visit (HOSPITAL_COMMUNITY): Payer: Medicare HMO | Attending: Cardiovascular Disease

## 2020-07-26 DIAGNOSIS — R0602 Shortness of breath: Secondary | ICD-10-CM

## 2020-07-26 DIAGNOSIS — R69 Illness, unspecified: Secondary | ICD-10-CM | POA: Diagnosis not present

## 2020-07-26 LAB — ECHOCARDIOGRAM COMPLETE
Area-P 1/2: 2.99 cm2
S' Lateral: 2.3 cm

## 2020-07-28 DIAGNOSIS — U071 COVID-19: Secondary | ICD-10-CM | POA: Diagnosis not present

## 2020-07-28 DIAGNOSIS — J1282 Pneumonia due to coronavirus disease 2019: Secondary | ICD-10-CM | POA: Diagnosis not present

## 2020-08-01 DIAGNOSIS — R69 Illness, unspecified: Secondary | ICD-10-CM | POA: Diagnosis not present

## 2020-08-08 DIAGNOSIS — R69 Illness, unspecified: Secondary | ICD-10-CM | POA: Diagnosis not present

## 2020-08-15 DIAGNOSIS — F331 Major depressive disorder, recurrent, moderate: Secondary | ICD-10-CM | POA: Diagnosis not present

## 2020-08-15 DIAGNOSIS — F250 Schizoaffective disorder, bipolar type: Secondary | ICD-10-CM | POA: Diagnosis not present

## 2020-08-15 DIAGNOSIS — R69 Illness, unspecified: Secondary | ICD-10-CM | POA: Diagnosis not present

## 2020-08-22 ENCOUNTER — Ambulatory Visit: Payer: Medicare HMO | Admitting: Pulmonary Disease

## 2020-08-22 ENCOUNTER — Encounter: Payer: Self-pay | Admitting: Pulmonary Disease

## 2020-08-22 ENCOUNTER — Other Ambulatory Visit: Payer: Self-pay

## 2020-08-22 VITALS — BP 118/68 | HR 85 | Temp 91.3°F | Ht 61.0 in | Wt 160.2 lb

## 2020-08-22 DIAGNOSIS — G629 Polyneuropathy, unspecified: Secondary | ICD-10-CM | POA: Insufficient documentation

## 2020-08-22 DIAGNOSIS — Z8616 Personal history of COVID-19: Secondary | ICD-10-CM

## 2020-08-22 DIAGNOSIS — F331 Major depressive disorder, recurrent, moderate: Secondary | ICD-10-CM | POA: Diagnosis not present

## 2020-08-22 DIAGNOSIS — R0602 Shortness of breath: Secondary | ICD-10-CM | POA: Diagnosis not present

## 2020-08-22 DIAGNOSIS — R69 Illness, unspecified: Secondary | ICD-10-CM | POA: Diagnosis not present

## 2020-08-22 DIAGNOSIS — F250 Schizoaffective disorder, bipolar type: Secondary | ICD-10-CM | POA: Diagnosis not present

## 2020-08-22 MED ORDER — BREO ELLIPTA 100-25 MCG/INH IN AEPB: 1.0000 | INHALATION_SPRAY | Freq: Every day | RESPIRATORY_TRACT | 3 refills | Status: DC

## 2020-08-22 MED ORDER — BREO ELLIPTA 200-25 MCG/INH IN AEPB: 1.0000 | INHALATION_SPRAY | Freq: Every day | RESPIRATORY_TRACT | 0 refills | Status: DC

## 2020-08-22 NOTE — Progress Notes (Addendum)
Subjective:   PATIENT ID: Julia [name] GENDER: female DOB: April 18, 1961, MRN: [id number]   HPI  Chief Complaint  Patient presents with  . Consult    SHOB since COVID 19 and PNA.     Reason for Visit: New consult for shortness of breath  Julia [name] is a 59 year old female with prior history of COVID pneumonia who presents as new consult.  She is followed by cardiology and was last seen with Dr. Flora Lipps on 07/07/2020.  She was seen for shortness of breath.  Echocardiogram ordered demonstrated a normal test.  She was referred to pulmonary for further evaluation as her symptoms may be related to her recent Covid infection.  She was hospitalized from 8/19-8/29 for COVID pneumonia.  She received remdesivir, steroids and barcitinib.  She was discharged on oxygen. Since being home, she has episodes of shortness of breath that intermittently occurs even at rest. Has tried albuterol but no benefit was seen. She feels like her heart is stopping. Denies wheezing or cough. She does have some dysphagia after a cervical fusion of C3-C5. Denies choking spells but will have food get stuck her throat. Talking and exertion can worsen her breathing. Of note, she has left leg pain related to her prior paget's so walking is limited by this.   Social History: Remote non-contributory smoking history  I have personally reviewed patient's past medical/family/social history, allergies, current medications.  Past Medical History:  Diagnosis Date  . Anxiety   . Arthritis   . Complication of anesthesia    slow to wake up-does not take much medicine to sedate her  . Depression   . Esophageal reflux   . Hyperlipidemia   . Paget disease of bone   . Pneumonia due to COVID-19 virus 05/20/2020  . Wears glasses      Family History  Problem Relation Age of Onset  . Thyroid disease Mother   . Congestive Heart Failure Mother   . Heart disease Mother   . Lung cancer Sister   . Melanoma Sister   .  Healthy Brother   . Healthy Brother   . Healthy Brother   . Healthy Brother   . Heart disease Father      Social History   Occupational History  . Not on file  Tobacco Use  . Smoking status: Former Smoker    Packs/day: 0.15    Years: 5.00    Pack years: 0.75    Quit date: 01/02/1985    Years since quitting: 35.6  . Smokeless tobacco: Never Used  . Tobacco comment: 1 pack per week per pt.   Vaping Use  . Vaping Use: Never used  Substance and Sexual Activity  . Alcohol use: Yes    Alcohol/week: 1.0 standard drink    Types: 1 Glasses of wine per week    Comment: occassional  . Drug use: No  . Sexual activity: Not Currently    Allergies  Allergen Reactions  . Lamictal [Lamotrigine] Itching and Swelling     Outpatient Medications Prior to Visit  Medication Sig Dispense Refill  . atorvastatin (LIPITOR) 40 MG tablet Take 1 tablet (40 mg total) by mouth every evening. 90 tablet 1  . buPROPion (WELLBUTRIN XL) 300 MG 24 hr tablet Take 300 mg by mouth daily.     [name] gabapentin (NEURONTIN) 300 MG capsule Take 300 mg by mouth 2 (two) times daily.    [name] omeprazole (PRILOSEC) 40 MG capsule Take 1 capsule (40  mg total) by mouth daily. 90 capsule 3  . Probiotic Product (PROBIOTIC DAILY PO) Take 1 tablet by mouth daily.    . sertraline (ZOLOFT) 100 MG tablet Take 200 mg by mouth daily.     . traZODone (DESYREL) 50 MG tablet Take 50 mg by mouth at bedtime.     No facility-administered medications prior to visit.    Review of Systems  Constitutional: Negative for chills, diaphoresis, fever, malaise/fatigue and weight loss.  HENT: Negative for congestion, ear pain and sore throat.   Respiratory: Positive for shortness of breath. Negative for cough, hemoptysis, sputum production and wheezing.   Cardiovascular: Positive for palpitations. Negative for chest pain and leg swelling.  Gastrointestinal: Negative for abdominal pain, heartburn and nausea.  Genitourinary: Negative for frequency.   Musculoskeletal: Negative for joint pain and myalgias.  Skin: Negative for itching and rash.  Neurological: Positive for tingling (chronic). Negative for dizziness, weakness and headaches.  Endo/Heme/Allergies: Does not bruise/bleed easily.  Psychiatric/Behavioral: Negative for depression. The patient is not nervous/anxious.      Objective:   Vitals:   08/22/20 1514  BP: 118/68  Pulse: 85  Temp: (!) 91.3 F (32.9 C)  TempSrc: Tympanic  SpO2: 99%  Weight: 160 lb 4 oz (72.7 kg)  Height: 5\' 1"  (1.549 m)   SpO2: 99 %  Physical Exam: General: Well-appearing, no acute distress HENT: [place], AT Eyes: EOMI, no scleral icterus Respiratory: Clear to auscultation bilaterally.  No crackles, wheezing or rales Cardiovascular: RRR, -M/R/G, no JVD Extremities:-Edema,-tenderness Neuro: AAO x4, CNII-XII grossly intact Skin: Intact, no rashes or bruising Psych: Normal mood, normal affect  Data Reviewed:  Imaging: CTA 05/29/2020-no pulmonary embolism.  Bilateral consolidations and groundglass opacity seen  PFT: None on file  Labs: CBC    Component Value Date/Time   WBC 7.4 06/09/2020 1356   WBC 14.4 (H) 05/29/2020 0236   RBC 4.03 06/09/2020 1356   RBC 3.86 (L) 05/29/2020 0236   HGB 11.9 06/09/2020 1356   HCT 38.0 06/09/2020 1356   PLT 359 06/09/2020 1356   MCV 94 06/09/2020 1356   MCH 29.5 06/09/2020 1356   MCH 30.3 05/29/2020 0236   MCHC 31.3 (L) 06/09/2020 1356   MCHC 32.0 05/29/2020 0236   RDW 13.7 06/09/2020 1356   LYMPHSABS 1.2 06/09/2020 1356   MONOABS 0.3 05/29/2020 0236   EOSABS 0.3 06/09/2020 1356   BASOSABS 0.0 06/09/2020 1356   CBC 05/29/2020 with 300 absolute eos  Imaging, labs and test noted above have been reviewed independently by me.    Assessment & Plan:   Discussion: 59 year old female with prior Covid infection who presents as a new consult for shortness of breath.  3 months ago she was hospitalized for COVID pneumonia requiring oxygen.  Since then  she was discontinued on her home O2 but continues to have persistent dyspnea at rest and on exertion.  She has been seen by cardiology and work-up was unrevealing including normal echocardiogram.  We reviewed her CT in clinic.  She has not had repeat imaging since then.  She may benefit from short-term bronchodilator therapy and we will obtain pulmonary function tests to rule out obstructive or restrictive disease.  In-office ambulatory test attempted however stopped due to leg pain. No desaturations noted.   Shortness of breath History covid pneumonia --START Breo ONE puff ONCE a day --Will arrange for pulmonary function test --Will order CT Chest without contrast  Follow-up in 1 month with me  Health Maintenance Immunization History  Administered  Date(s) Administered  . Influenza Split 06/22/2015, 07/03/2016  . Influenza,inj,Quad PF,6+ Mos 06/25/2019, 06/28/2020  . Influenza,trivalent, recombinat, inj, PF 07/16/2017  . Tdap 11/11/2013  . Zoster Recombinat (Shingrix) 06/25/2019, 12/11/2019  Not planning to receive covid vaccinations.  CT Lung Screen - not qualified  Orders Placed This Encounter  Procedures  . Pulmonary function test    Standing Status:   Future    Standing Expiration Date:   08/22/2021    Order Specific Question:   Where should this test be performed?    Answer:   [facility]    Order Specific Question:   Full PFT: includes the following: basic spirometry, spirometry pre & post bronchodilator, diffusion capacity (DLCO), lung volumes    Answer:   Full PFT    Order Specific Question:   MIP/MEP    Answer:   No    Order Specific Question:   6 minute walk    Answer:   No    Order Specific Question:   ABG    Answer:   No    Order Specific Question:   Diffusion capacity (DLCO)    Answer:   Yes    Order Specific Question:   Lung volumes    Answer:   Yes    Order Specific Question:   Methacholine challenge    Answer:   No  No orders of the defined types were  placed in this encounter.   Return in 1 month (on 09/21/2020).  I have spent a total time of 33-minutes on the day of the appointment reviewing prior documentation, coordinating care and discussing medical diagnosis and plan with the patient/family. Imaging, labs and tests included in this note have been reviewed and interpreted independently by me.  Alba Perillo Mechele Collin, MD [facility] 08/22/2020 3:41 PM  Office Number [phone]

## 2020-08-22 NOTE — Patient Instructions (Addendum)
Shortness of breath History covid pneumonia --START Breo ONE puff ONCE a day --Will arrange for pulmonary function test --Will order CT Chest without contrast  Follow-up in one month with me

## 2020-08-28 DIAGNOSIS — J1282 Pneumonia due to coronavirus disease 2019: Secondary | ICD-10-CM | POA: Diagnosis not present

## 2020-08-28 DIAGNOSIS — U071 COVID-19: Secondary | ICD-10-CM | POA: Diagnosis not present

## 2020-08-29 DIAGNOSIS — R69 Illness, unspecified: Secondary | ICD-10-CM | POA: Diagnosis not present

## 2020-09-06 ENCOUNTER — Other Ambulatory Visit: Payer: Self-pay

## 2020-09-06 ENCOUNTER — Ambulatory Visit (HOSPITAL_COMMUNITY)
Admission: RE | Admit: 2020-09-06 | Discharge: 2020-09-06 | Disposition: A | Discharge status: Home / Self Care | Payer: Medicare HMO | Source: Ambulatory Visit | Attending: Pulmonary Disease | Admitting: Pulmonary Disease

## 2020-09-06 DIAGNOSIS — R0602 Shortness of breath: Secondary | ICD-10-CM

## 2020-09-06 DIAGNOSIS — Z8616 Personal history of COVID-19: Secondary | ICD-10-CM | POA: Insufficient documentation

## 2020-09-12 DIAGNOSIS — R69 Illness, unspecified: Secondary | ICD-10-CM | POA: Diagnosis not present

## 2020-09-16 ENCOUNTER — Other Ambulatory Visit: Payer: Self-pay | Admitting: *Deleted

## 2020-09-16 MED ORDER — GABAPENTIN 300 MG PO CAPS
300.0000 mg | ORAL_CAPSULE | Freq: Two times a day (BID) | ORAL | 1 refills | Status: DC
Start: 2020-09-16 — End: 2021-12-28

## 2020-09-19 DIAGNOSIS — R69 Illness, unspecified: Secondary | ICD-10-CM | POA: Diagnosis not present

## 2020-09-27 DIAGNOSIS — J1282 Pneumonia due to coronavirus disease 2019: Secondary | ICD-10-CM | POA: Diagnosis not present

## 2020-09-27 DIAGNOSIS — U071 COVID-19: Secondary | ICD-10-CM | POA: Diagnosis not present

## 2020-10-05 ENCOUNTER — Other Ambulatory Visit: Payer: Medicare HMO

## 2020-10-05 DIAGNOSIS — Z20822 Contact with and (suspected) exposure to covid-19: Secondary | ICD-10-CM | POA: Diagnosis not present

## 2020-10-06 LAB — NOVEL CORONAVIRUS, NAA: SARS-CoV-2, NAA: NOT DETECTED

## 2020-10-06 LAB — SARS-COV-2, NAA 2 DAY TAT

## 2020-10-07 ENCOUNTER — Telehealth: Payer: Self-pay

## 2020-10-07 NOTE — Telephone Encounter (Signed)
ATC but no voicemail was available to schedule for Covid testing today Friday 10/07/2020 for PFT on Monday 10/10/2020. Last Covid testing was completed on 10/05/2020 which would be out of 72 hour period for PFT on Monday. If pt returns call please either schedule for Covid test today or Saturday or re-schedule PFT along with a Covid test within 72 hours prior to PFT or verify vaccination status. Thank you. I will attempt to reach pt at later time today.

## 2020-10-07 NOTE — Telephone Encounter (Signed)
ATC but no voicemail available about rescheduling PFT

## 2020-10-10 ENCOUNTER — Ambulatory Visit (INDEPENDENT_AMBULATORY_CARE_PROVIDER_SITE_OTHER): Payer: Medicare HMO | Admitting: Pulmonary Disease

## 2020-10-10 ENCOUNTER — Other Ambulatory Visit: Payer: Self-pay

## 2020-10-10 ENCOUNTER — Encounter: Payer: Self-pay | Admitting: Pulmonary Disease

## 2020-10-10 VITALS — BP 126/72 | HR 88 | Temp 97.9°F | Ht 61.0 in | Wt 160.0 lb

## 2020-10-10 DIAGNOSIS — U099 Post covid-19 condition, unspecified: Secondary | ICD-10-CM

## 2020-10-10 DIAGNOSIS — R0602 Shortness of breath: Secondary | ICD-10-CM

## 2020-10-10 LAB — PULMONARY FUNCTION TEST
DL/VA % pred: 119 %
DL/VA: 5.17 ml/min/mmHg/L
DLCO cor % pred: 99 %
DLCO cor: 18.3 ml/min/mmHg
DLCO unc % pred: 99 %
DLCO unc: 18.3 ml/min/mmHg
FEF 25-75 Post: 1.52 L/sec
FEF 25-75 Pre: 1.46 L/sec
FEF2575-%Change-Post: 4 %
FEF2575-%Pred-Post: 67 %
FEF2575-%Pred-Pre: 64 %
FEV1-%Change-Post: 0 %
FEV1-%Pred-Post: 84 %
FEV1-%Pred-Pre: 84 %
FEV1-Post: 1.96 L
FEV1-Pre: 1.96 L
FEV1FVC-%Change-Post: 0 %
FEV1FVC-%Pred-Pre: 96 %
FEV6-%Change-Post: -1 %
FEV6-%Pred-Post: 88 %
FEV6-%Pred-Pre: 89 %
FEV6-Post: 2.56 L
FEV6-Pre: 2.6 L
FEV6FVC-%Pred-Post: 103 %
FEV6FVC-%Pred-Pre: 103 %
FVC-%Change-Post: 0 %
FVC-%Pred-Post: 86 %
FVC-%Pred-Pre: 86 %
FVC-Post: 2.6 L
FVC-Pre: 2.6 L
Post FEV1/FVC ratio: 75 %
Post FEV6/FVC ratio: 100 %
Pre FEV1/FVC ratio: 76 %
Pre FEV6/FVC Ratio: 100 %
RV % pred: 62 %
RV: 1.11 L
TLC % pred: 80 %
TLC: 3.69 L

## 2020-10-10 NOTE — Progress Notes (Signed)
Subjective:   PATIENT ID: Julia [name] GENDER: female DOB: 13-May-1961, MRN: [id number]   HPI  Chief Complaint  Patient presents with  . Follow-up    F/U after PFT. States her breathing has been stable since last visit.     Reason for Visit: F/u shortness of breath  Ms. Dynasia Kercheval is a 60 year old female with prior history of COVID pneumonia who presents for shortness of breath  Synopsis: She was hospitalized from 8/19-8/29 for COVID pneumonia.  She received remdesivir, steroids and barcitinib.  She was discharged on oxygen. Since being home, she has episodes of shortness of breath that intermittently occurs even at rest. Evaluation by Cardiology with normal echocardiogram.  Since our last visit she still has shortness of breath. Trial of Breo did not make a difference in her symptoms so self-discontinued. Somewhat improved but persistent. No wheezing or cough associated. Limitation in activity related to joints and not necessarily from breathing.   Social History: Remote non-contributory smoking history  I have personally reviewed patient's past medical/family/social history/allergies/current medications.  Past Medical History:  Diagnosis Date  . Anxiety   . Arthritis   . Complication of anesthesia    slow to wake up-does not take much medicine to sedate her  . Depression   . Esophageal reflux   . Hyperlipidemia   . Paget disease of bone   . Pneumonia due to COVID-19 virus 05/20/2020  . Wears glasses     Allergies  Allergen Reactions  . Lamictal [Lamotrigine] Itching and Swelling     Outpatient Medications Prior to Visit  Medication Sig Dispense Refill  . atorvastatin (LIPITOR) 40 MG tablet Take 1 tablet (40 mg total) by mouth every evening. 90 tablet 1  . buPROPion (WELLBUTRIN XL) 300 MG 24 hr tablet Take 300 mg by mouth daily.     . fluticasone furoate-vilanterol (BREO ELLIPTA) 100-25 MCG/INH AEPB Inhale 1 puff into the lungs daily. 1 each 3  . fluticasone  furoate-vilanterol (BREO ELLIPTA) 200-25 MCG/INH AEPB Inhale 1 puff into the lungs daily. 2 each 0  . gabapentin (NEURONTIN) 300 MG capsule Take 1 capsule (300 mg total) by mouth 2 (two) times daily. 180 capsule 1  . omeprazole (PRILOSEC) 40 MG capsule Take 1 capsule (40 mg total) by mouth daily. 90 capsule 3  . Probiotic Product (PROBIOTIC DAILY PO) Take 1 tablet by mouth daily.    . sertraline (ZOLOFT) 100 MG tablet Take 200 mg by mouth daily.     . traZODone (DESYREL) 50 MG tablet Take 50 mg by mouth at bedtime.     No facility-administered medications prior to visit.    Review of Systems  Constitutional: Negative for chills, diaphoresis, fever, malaise/fatigue and weight loss.  HENT: Negative for congestion.   Respiratory: Negative for cough, hemoptysis, sputum production, shortness of breath and wheezing.   Cardiovascular: Negative for chest pain, palpitations and leg swelling.     Objective:   Vitals:   10/10/20 1602  BP: 126/72  Pulse: 88  Temp: 97.9 F (36.6 C)  TempSrc: Temporal  SpO2: 99%  Weight: 160 lb (72.6 kg)  Height: 5\' 1"  (1.549 m)      Physical Exam: General: Well-appearing, no acute distress HENT: [place], AT Eyes: EOMI, no scleral icterus Respiratory: Clear to auscultation bilaterally.  No crackles, wheezing or rales Cardiovascular: RRR, -M/R/G, no JVD Extremities:-Edema,-tenderness Neuro: AAO x4, CNII-XII grossly intact Skin: Intact, no rashes or bruising Psych: Normal mood, normal affect   Data Reviewed:  Imaging: CTA 05/29/2020-no pulmonary embolism.  Bilateral consolidations and groundglass opacity seen CT Chest 09/06/20 - Compared to prior chest imaging, significantly improved ground glass opacities with subtle opacities associated with mosaic attenuation  PFT: 10/11/19 FVC 2.6 (86%) FEV1 1.96 (84%) Ratio 76  TLC 80% DLCO 99% Interpretation: Normal PFTs  Labs: CBC    Component Value Date/Time   WBC 7.4 06/09/2020 1356   WBC 14.4 (H)  05/29/2020 0236   RBC 4.03 06/09/2020 1356   RBC 3.86 (L) 05/29/2020 0236   HGB 11.9 06/09/2020 1356   HCT 38.0 06/09/2020 1356   PLT 359 06/09/2020 1356   MCV 94 06/09/2020 1356   MCH 29.5 06/09/2020 1356   MCH 30.3 05/29/2020 0236   MCHC 31.3 (L) 06/09/2020 1356   MCHC 32.0 05/29/2020 0236   RDW 13.7 06/09/2020 1356   LYMPHSABS 1.2 06/09/2020 1356   MONOABS 0.3 05/29/2020 0236   EOSABS 0.3 06/09/2020 1356   BASOSABS 0.0 06/09/2020 1356   CBC 05/29/2020 with 300 absolute eos  Imaging, labs and test noted above have been reviewed independently by me.    Assessment & Plan:   Discussion: 60 year old female with prior Covid infection who presents for follow-up of shortness of breath. PFTs reviewed and normal. Reviewed CT Chest with patient. Imaging demonstrates significant improvement with subtle evidence of ground glass opacities that is likely related to viral infection. I expect to this to completely resolve and believe that her dyspnea at this point is a combination of resolving Covid pneumonia with deconditioning. Discussed expectation of slow recovery and encouraged regular aerobic activity  Shortness of breath - unchanged. With negative PFT and echo negative. Suspect deconditioning. Covid long hauler --STOP Breo --Encourage regular aerobic exercise --Offer rehab however declined at this time. Referral available anytime in the future. Patient advised to call.  Follow up as needed  Health Maintenance Immunization History  Administered Date(s) Administered  . Influenza Split 06/22/2015, 07/03/2016  . Influenza,inj,Quad PF,6+ Mos 06/25/2019, 06/28/2020  . Influenza,trivalent, recombinat, inj, PF 07/16/2017  . Tdap 11/11/2013  . Zoster Recombinat (Shingrix) 06/25/2019, 12/11/2019  Not planning to receive covid vaccinations.  CT Lung Screen - not qualified  No orders of the defined types were placed in this encounter. No orders of the defined types were placed in this  encounter.   Return if symptoms worsen or fail to improve.  I have spent a total time of 31-minutes on the day of the appointment reviewing prior documentation, coordinating care and discussing medical diagnosis and plan with the patient/family. Imaging, labs and tests included in this note have been reviewed and interpreted independently by me.  Srinidhi Landers Mechele Collin, MD [facility] 10/10/2020 1:57 PM  Office Number [phone]

## 2020-10-10 NOTE — Progress Notes (Signed)
Full PFT performed today. °

## 2020-10-13 DIAGNOSIS — U099 Post covid-19 condition, unspecified: Secondary | ICD-10-CM | POA: Insufficient documentation

## 2020-10-28 DIAGNOSIS — U071 COVID-19: Secondary | ICD-10-CM | POA: Diagnosis not present

## 2020-10-28 DIAGNOSIS — J1282 Pneumonia due to coronavirus disease 2019: Secondary | ICD-10-CM | POA: Diagnosis not present

## 2020-11-02 DIAGNOSIS — R69 Illness, unspecified: Secondary | ICD-10-CM | POA: Diagnosis not present

## 2020-11-11 DIAGNOSIS — R69 Illness, unspecified: Secondary | ICD-10-CM | POA: Diagnosis not present

## 2020-11-18 IMAGING — DX DG CHEST 2V
2 series · 2 of 2 positions shown · non-contrast
Comparison: May 19, 2020.

CLINICAL DATA: [id number] pneumonia.

EXAM:
CHEST - 2 VIEW

[chest pa]
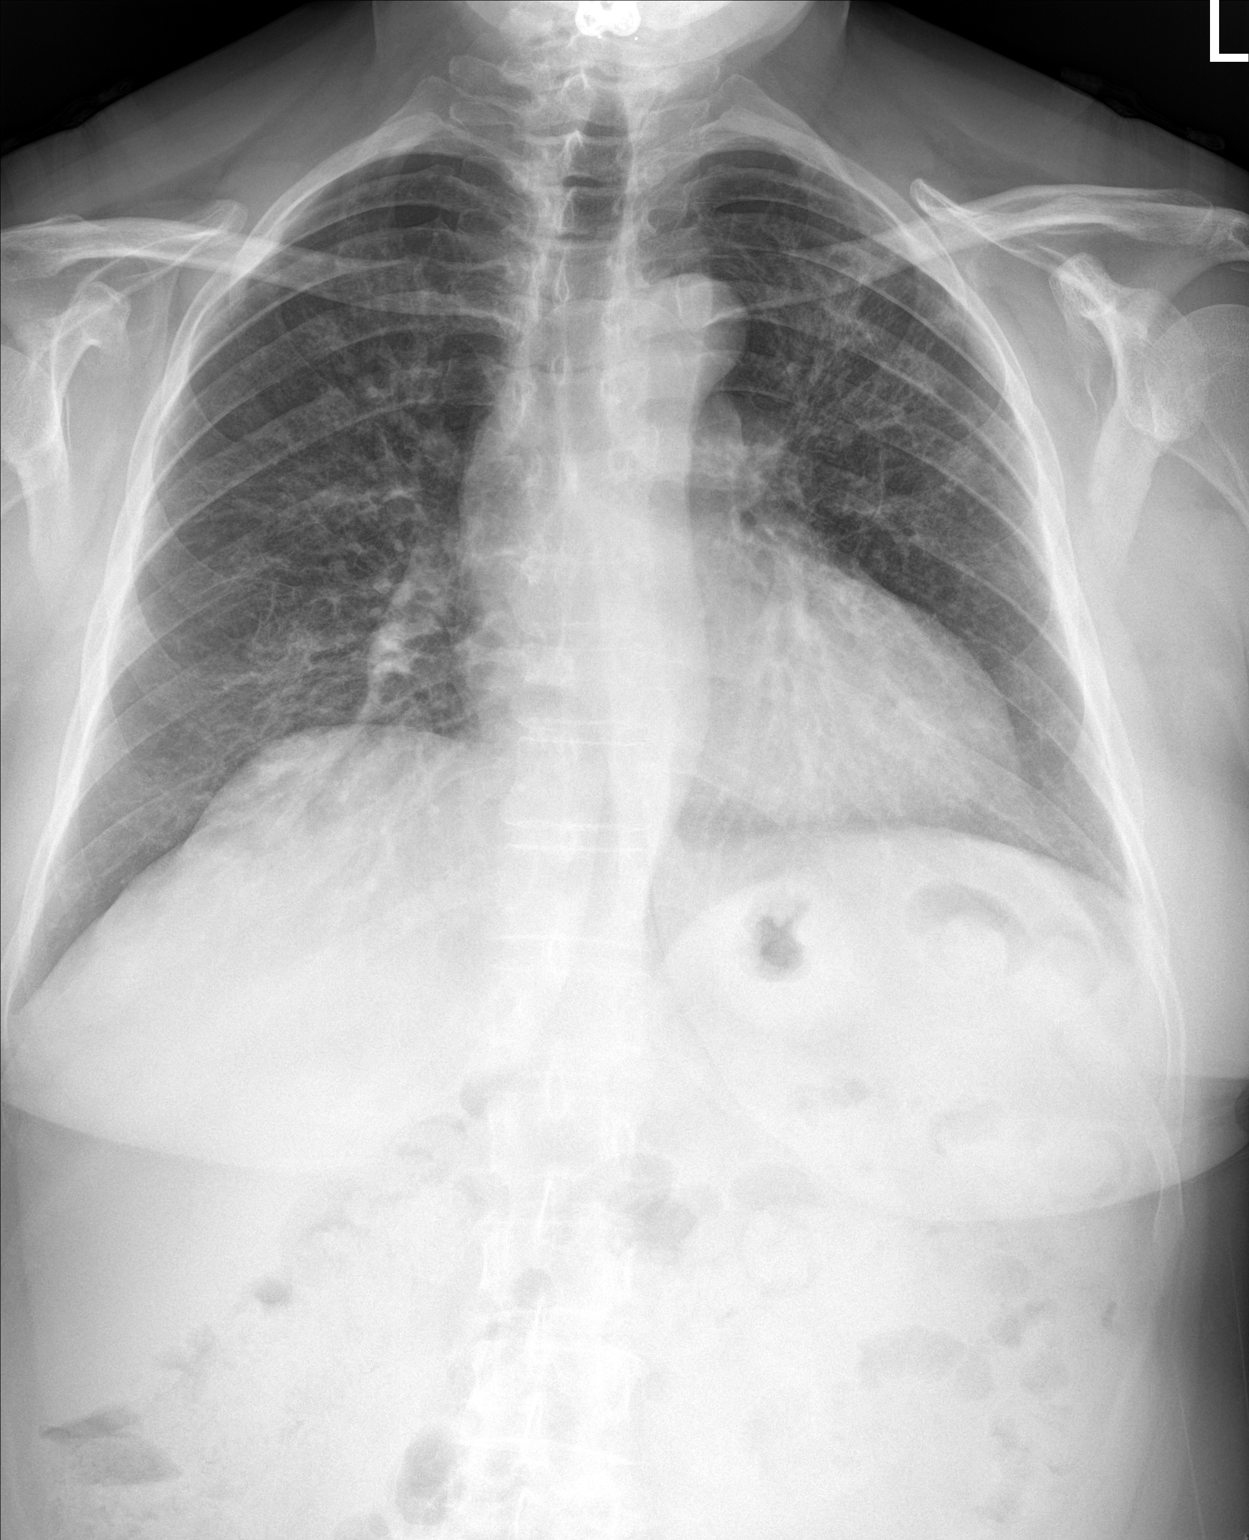

[chest lat]
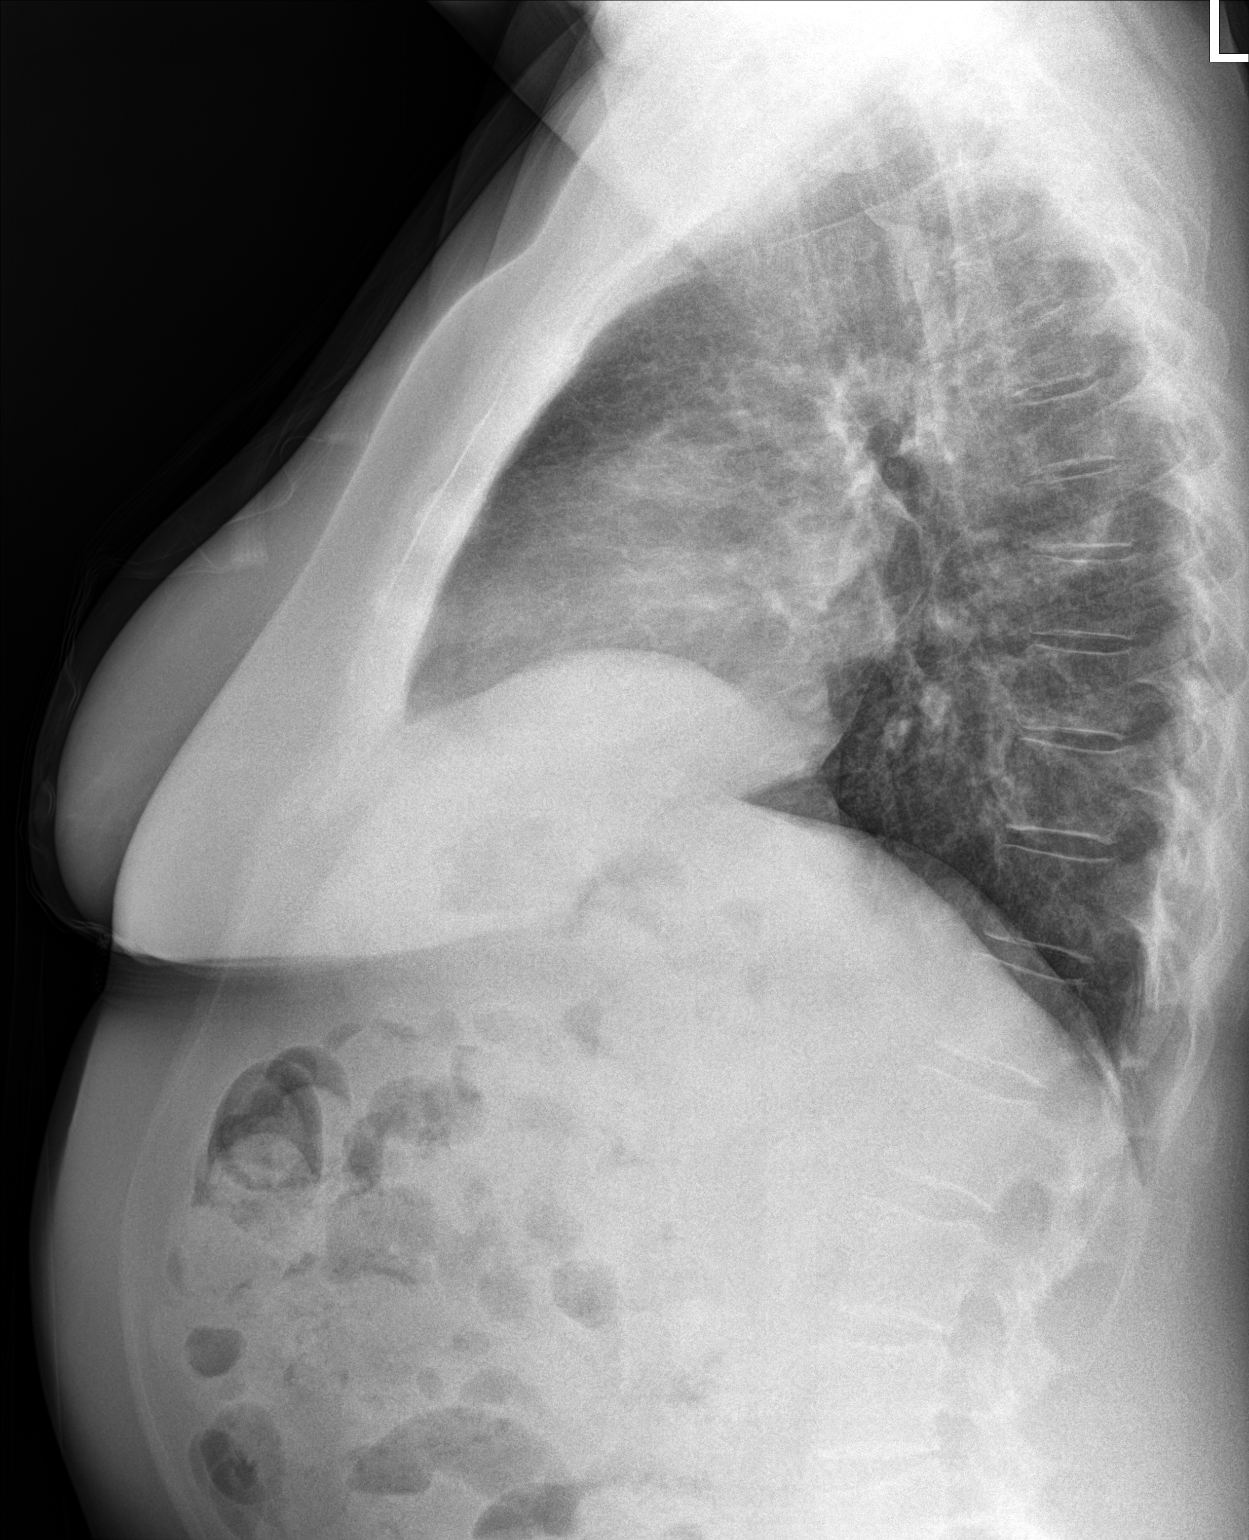

[2 of 2 positions shown; findings below may reference images not displayed]

FINDINGS: The heart size and mediastinal contours are within normal limits. No
pneumothorax or pleural effusion is noted. Mild left upper lobe in
right lower lobe opacities are noted which may represent scarring or
possibly multifocal pneumonia. Overall there is significant
improvement in bibasilar opacities compared to prior exam. The
visualized skeletal structures are unremarkable.
IMPRESSION: Significant improvement in bibasilar opacities compared to prior
exam. Mild left upper lobe and right lower lobe opacities are noted
which may represent scarring or possibly multifocal pneumonia.

## 2020-11-28 DIAGNOSIS — U071 COVID-19: Secondary | ICD-10-CM | POA: Diagnosis not present

## 2020-11-28 DIAGNOSIS — J1282 Pneumonia due to coronavirus disease 2019: Secondary | ICD-10-CM | POA: Diagnosis not present

## 2020-11-30 DIAGNOSIS — F4310 Post-traumatic stress disorder, unspecified: Secondary | ICD-10-CM | POA: Diagnosis not present

## 2020-11-30 DIAGNOSIS — F319 Bipolar disorder, unspecified: Secondary | ICD-10-CM | POA: Diagnosis not present

## 2020-11-30 DIAGNOSIS — F333 Major depressive disorder, recurrent, severe with psychotic symptoms: Secondary | ICD-10-CM | POA: Diagnosis not present

## 2020-11-30 DIAGNOSIS — F3131 Bipolar disorder, current episode depressed, mild: Secondary | ICD-10-CM | POA: Diagnosis not present

## 2020-11-30 DIAGNOSIS — R69 Illness, unspecified: Secondary | ICD-10-CM | POA: Diagnosis not present

## 2020-11-30 DIAGNOSIS — F331 Major depressive disorder, recurrent, moderate: Secondary | ICD-10-CM | POA: Diagnosis not present

## 2020-12-07 DIAGNOSIS — F3131 Bipolar disorder, current episode depressed, mild: Secondary | ICD-10-CM | POA: Diagnosis not present

## 2020-12-07 DIAGNOSIS — F4310 Post-traumatic stress disorder, unspecified: Secondary | ICD-10-CM | POA: Diagnosis not present

## 2020-12-07 DIAGNOSIS — F319 Bipolar disorder, unspecified: Secondary | ICD-10-CM | POA: Diagnosis not present

## 2020-12-07 DIAGNOSIS — F331 Major depressive disorder, recurrent, moderate: Secondary | ICD-10-CM | POA: Diagnosis not present

## 2020-12-07 DIAGNOSIS — R69 Illness, unspecified: Secondary | ICD-10-CM | POA: Diagnosis not present

## 2020-12-07 DIAGNOSIS — F333 Major depressive disorder, recurrent, severe with psychotic symptoms: Secondary | ICD-10-CM | POA: Diagnosis not present

## 2020-12-19 DIAGNOSIS — F319 Bipolar disorder, unspecified: Secondary | ICD-10-CM | POA: Diagnosis not present

## 2020-12-19 DIAGNOSIS — F3131 Bipolar disorder, current episode depressed, mild: Secondary | ICD-10-CM | POA: Diagnosis not present

## 2020-12-19 DIAGNOSIS — R69 Illness, unspecified: Secondary | ICD-10-CM | POA: Diagnosis not present

## 2020-12-19 DIAGNOSIS — F333 Major depressive disorder, recurrent, severe with psychotic symptoms: Secondary | ICD-10-CM | POA: Diagnosis not present

## 2020-12-19 DIAGNOSIS — F4310 Post-traumatic stress disorder, unspecified: Secondary | ICD-10-CM | POA: Diagnosis not present

## 2020-12-19 DIAGNOSIS — F331 Major depressive disorder, recurrent, moderate: Secondary | ICD-10-CM | POA: Diagnosis not present

## 2020-12-26 DIAGNOSIS — U071 COVID-19: Secondary | ICD-10-CM | POA: Diagnosis not present

## 2020-12-26 DIAGNOSIS — J1282 Pneumonia due to coronavirus disease 2019: Secondary | ICD-10-CM | POA: Diagnosis not present

## 2021-01-03 ENCOUNTER — Other Ambulatory Visit: Payer: Self-pay | Admitting: Family Medicine

## 2021-01-03 ENCOUNTER — Other Ambulatory Visit: Payer: Self-pay

## 2021-01-06 DIAGNOSIS — R69 Illness, unspecified: Secondary | ICD-10-CM | POA: Diagnosis not present

## 2021-01-11 ENCOUNTER — Ambulatory Visit: Payer: Medicare HMO | Admitting: Family Medicine

## 2021-01-12 ENCOUNTER — Other Ambulatory Visit: Payer: Self-pay | Admitting: Family Medicine

## 2021-01-12 DIAGNOSIS — K219 Gastro-esophageal reflux disease without esophagitis: Secondary | ICD-10-CM

## 2021-01-19 ENCOUNTER — Encounter: Payer: Self-pay | Admitting: Family Medicine

## 2021-01-24 DIAGNOSIS — R69 Illness, unspecified: Secondary | ICD-10-CM | POA: Diagnosis not present

## 2021-01-27 IMAGING — CT CT CHEST W/O CM
2 of 4 series · 14 of 36 positions shown, 17 images · non-contrast
Comparison: 05/29/2020

CLINICAL DATA: Chronic dyspnea and shortness of breath, recent
history of COVID pneumonia. Chronic interstitial lung disease.

EXAM:
CT CHEST WITHOUT CONTRAST
TECHNIQUE: Multidetector CT imaging of the chest was performed following the
standard protocol without IV contrast.

[Series 3: chest wo · axial · 0.93mm/px · z∈[-453,-209]mm · 11 of 146 slices shown, 14 images]
[im 12/146  mediastinal]
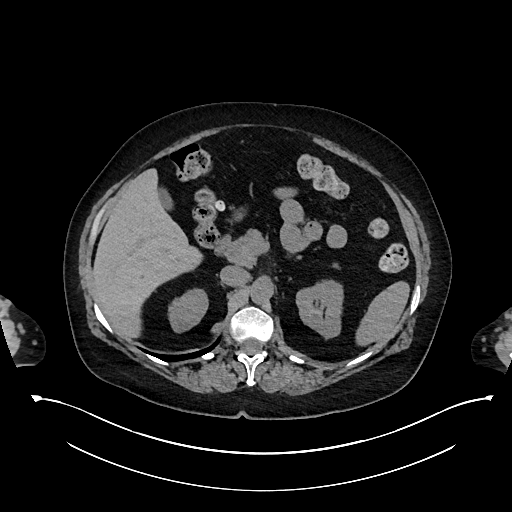
[im 12/146  lung]
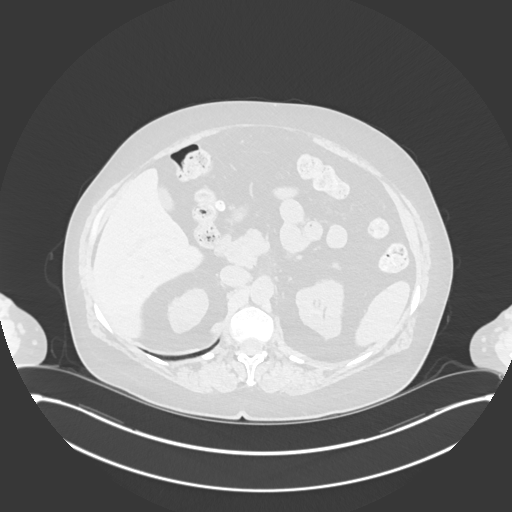
[im 23/146  lung]
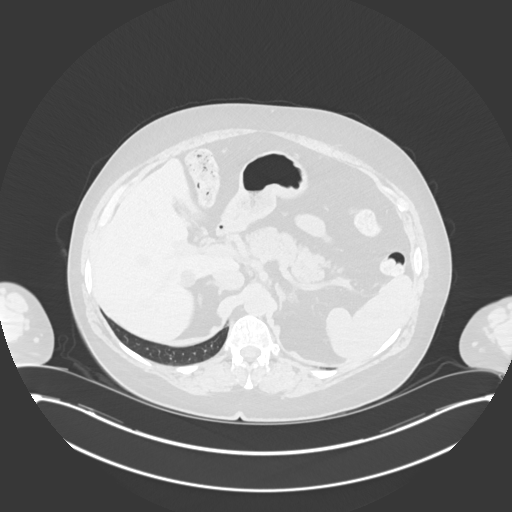
[im 34/146  lung]
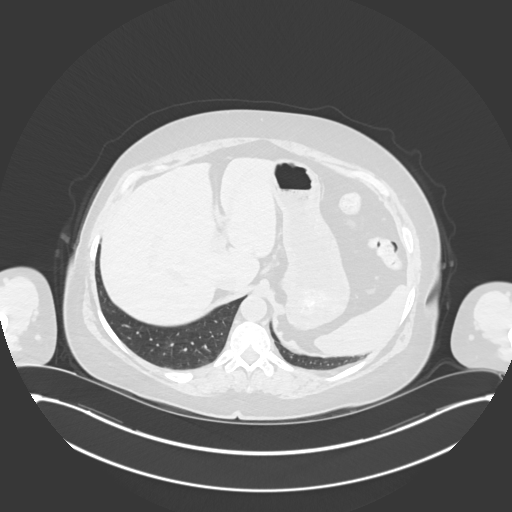
[im 45/146  lung]
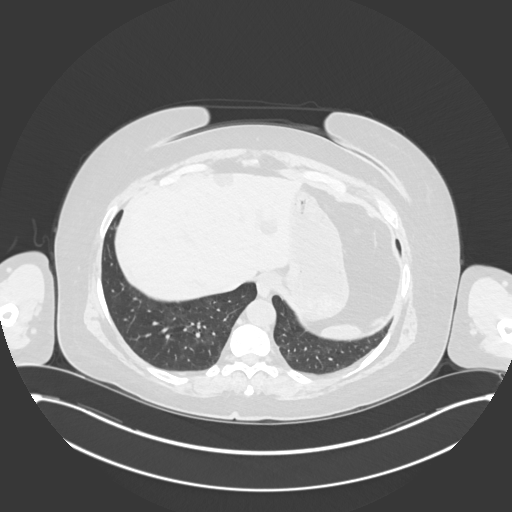
[im 56/146  mediastinal]
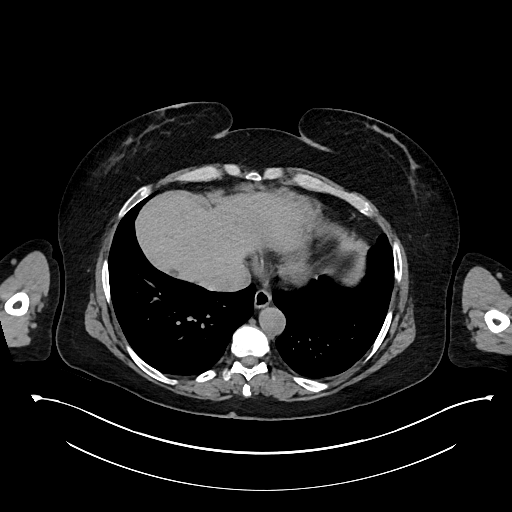
[im 56/146  lung]
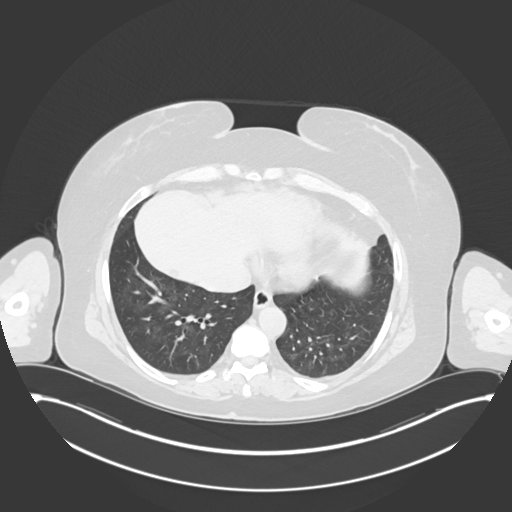
[im 79/146  lung]
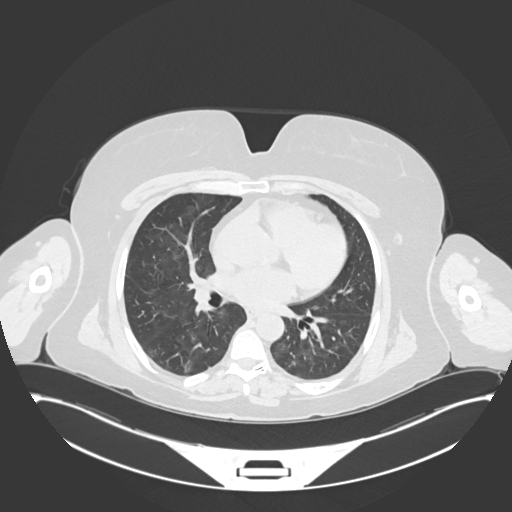
[im 90/146  lung]
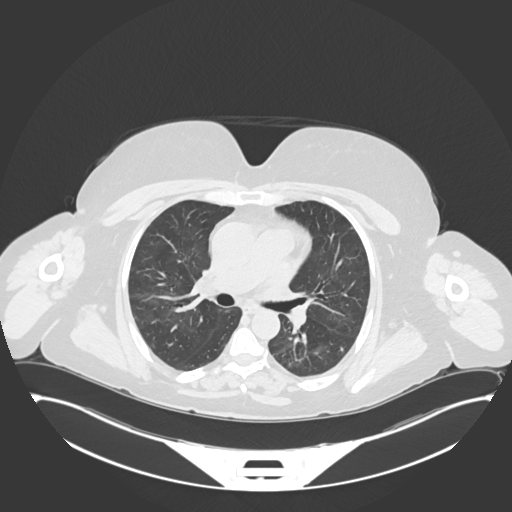
[im 101/146  lung]
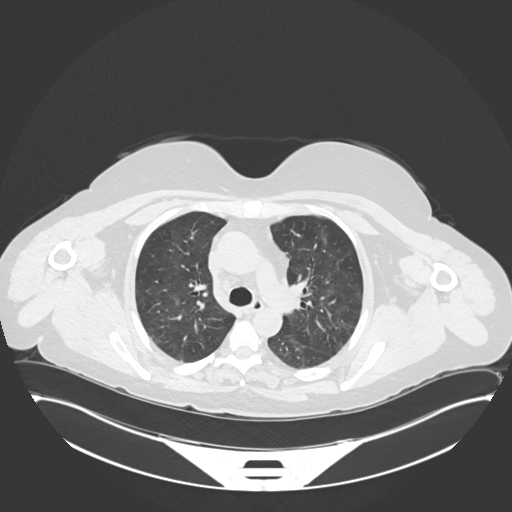
[im 112/146  mediastinal]
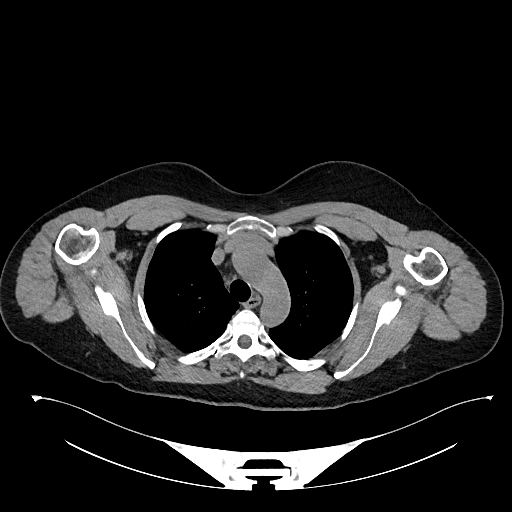
[im 112/146  lung]
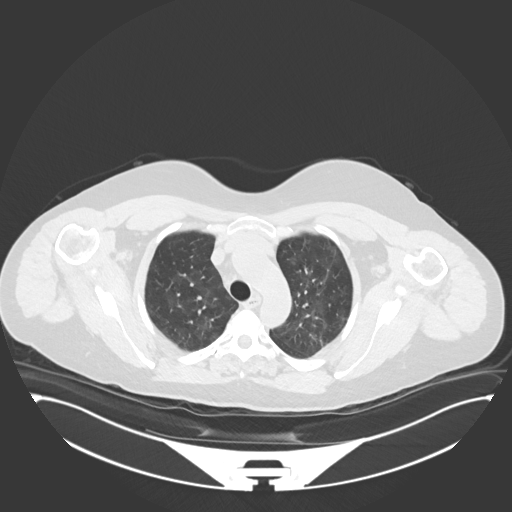
[im 123/146  lung]
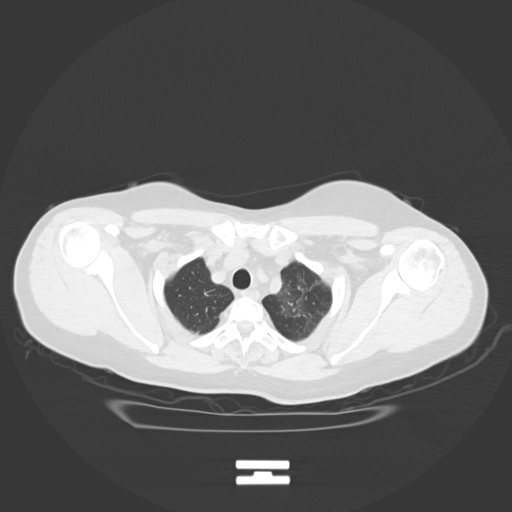
[im 134/146  lung]
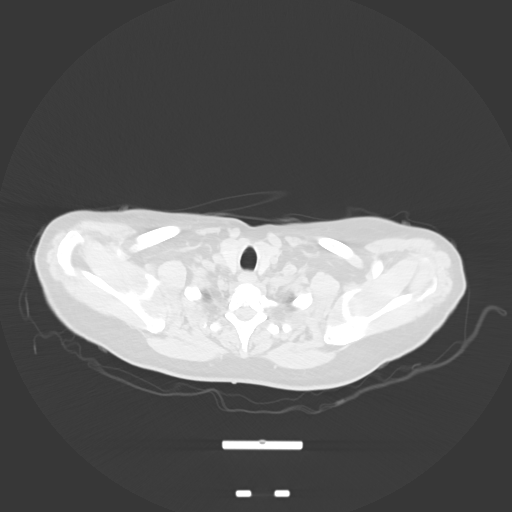

[Series 6: cor · coronal · 0.59mm/px · 3 of 150 slices shown]
[im 30/150  lung]
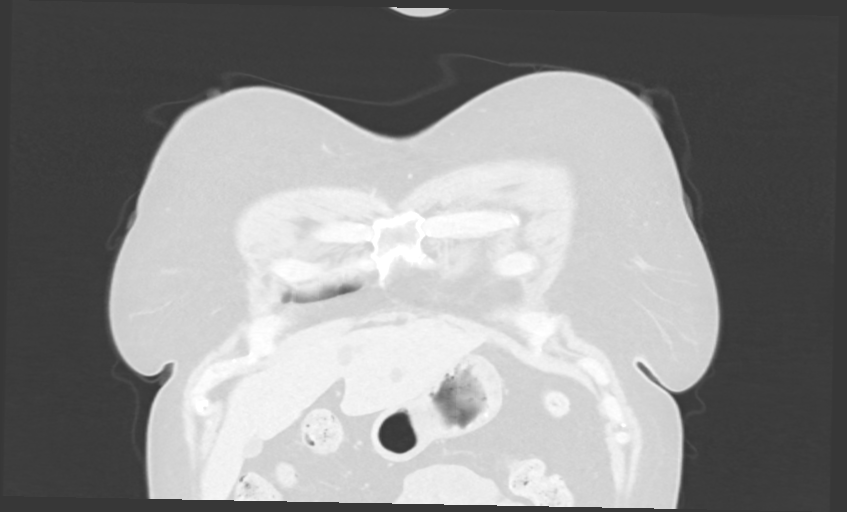
[im 60/150  lung]
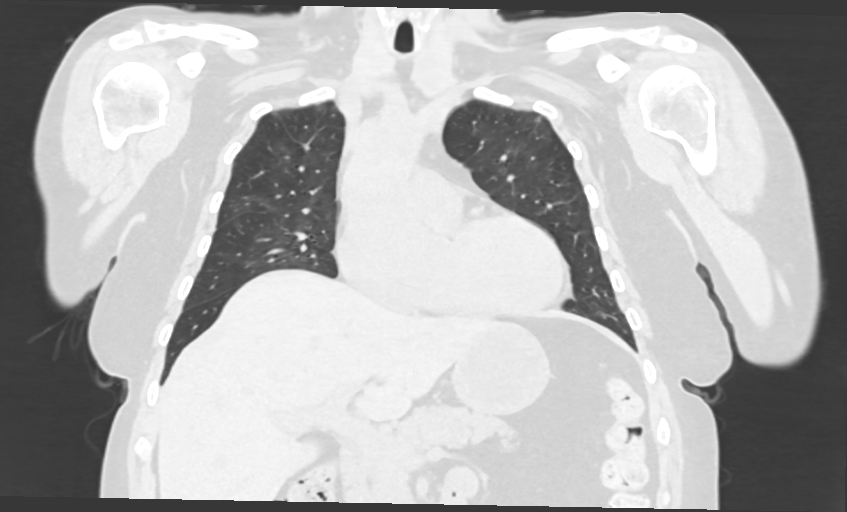
[im 90/150  lung]
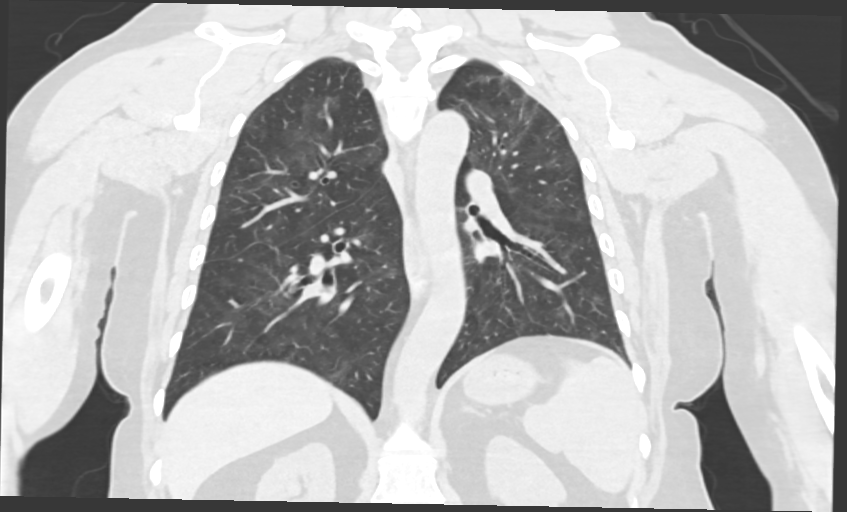

[14 of 36 positions shown; findings below may reference images not displayed]

FINDINGS: Cardiovascular: Left anterior descending and right coronary artery
atherosclerosis. Mild cardiomegaly.

Mediastinum/Nodes: No pathologic adenopathy.

Lungs/Pleura: Patchy mosaic attenuation but substantially improved
compared 05/29/2020, with hazy ill definition of the denser segments
but lacking the coarse interstitial accentuation and volume loss
shown on 05/29/2020. I would tend to favor faint alveolitis over air
trapping as a cause for the current appearance.

Upper Abdomen: Hypodense hepatic lesions, likely cysts, enhancement
characteristics are not assessed on today's noncontrast examination.

Musculoskeletal: Unremarkable
IMPRESSION: 1. Patchy mosaic attenuation in the lungs, substantially improved
compared to 05/29/2020, with hazy ill definition of the denser
segments but lacking the coarse interstitial accentuation and volume
loss shown on 05/29/2020. No substantial difference in size of the
pulmonary arterial branches in the more lucent and more opaque
portions of the lungs. In this context, this is most likely to
represent persistent (albeit substantially improved) opacities
related to prior COVID pneumonia, which can occur in about 25% of
patients who have substantial involvement on initial chest CT.
Hypersensitivity pneumonitis is a differential diagnostic
consideration. Reactive airways disease, bronchiolitis obliterans,
or chronic pulmonary embolus are considered less likely.
2. Coronary artery atherosclerosis. Mild cardiomegaly.
3. Hypodense hepatic lesions, likely cysts, enhancement
characteristics are not assessed on today's noncontrast examination.

## 2021-02-07 DIAGNOSIS — R69 Illness, unspecified: Secondary | ICD-10-CM | POA: Diagnosis not present

## 2021-02-08 ENCOUNTER — Encounter: Payer: Self-pay | Admitting: Family Medicine

## 2021-02-08 ENCOUNTER — Ambulatory Visit (INDEPENDENT_AMBULATORY_CARE_PROVIDER_SITE_OTHER): Payer: Medicare HMO | Admitting: Family Medicine

## 2021-02-08 ENCOUNTER — Other Ambulatory Visit: Payer: Self-pay

## 2021-02-08 VITALS — BP 129/80 | HR 76 | Temp 97.6°F | Ht 61.0 in | Wt 167.4 lb

## 2021-02-08 DIAGNOSIS — M1990 Unspecified osteoarthritis, unspecified site: Secondary | ICD-10-CM | POA: Diagnosis not present

## 2021-02-08 DIAGNOSIS — E669 Obesity, unspecified: Secondary | ICD-10-CM | POA: Diagnosis not present

## 2021-02-08 DIAGNOSIS — K219 Gastro-esophageal reflux disease without esophagitis: Secondary | ICD-10-CM | POA: Diagnosis not present

## 2021-02-08 DIAGNOSIS — F419 Anxiety disorder, unspecified: Secondary | ICD-10-CM

## 2021-02-08 DIAGNOSIS — N3946 Mixed incontinence: Secondary | ICD-10-CM

## 2021-02-08 DIAGNOSIS — R7303 Prediabetes: Secondary | ICD-10-CM | POA: Diagnosis not present

## 2021-02-08 DIAGNOSIS — F32A Depression, unspecified: Secondary | ICD-10-CM

## 2021-02-08 DIAGNOSIS — R69 Illness, unspecified: Secondary | ICD-10-CM | POA: Diagnosis not present

## 2021-02-08 DIAGNOSIS — R5383 Other fatigue: Secondary | ICD-10-CM | POA: Diagnosis not present

## 2021-02-08 DIAGNOSIS — E785 Hyperlipidemia, unspecified: Secondary | ICD-10-CM

## 2021-02-08 DIAGNOSIS — R6889 Other general symptoms and signs: Secondary | ICD-10-CM | POA: Diagnosis not present

## 2021-02-08 LAB — BAYER DCA HB A1C WAIVED: HB A1C (BAYER DCA - WAIVED): 5.7 % (ref <7.0)

## 2021-02-08 MED ORDER — MELOXICAM 15 MG PO TABS: 1.0000 | ORAL_TABLET | Freq: Every day | ORAL | 1 refills | Status: DC

## 2021-02-08 MED ORDER — ATORVASTATIN CALCIUM 40 MG PO TABS: 40.0000 mg | ORAL_TABLET | Freq: Every evening | ORAL | 1 refills | Status: DC

## 2021-02-08 MED ORDER — OMEPRAZOLE 40 MG PO CPDR: 1.0000 | DELAYED_RELEASE_CAPSULE | Freq: Every day | ORAL | 1 refills | Status: DC

## 2021-02-08 MED ORDER — MIRABEGRON ER 25 MG PO TB24: 25.0000 mg | ORAL_TABLET | Freq: Every day | ORAL | 2 refills | Status: DC

## 2021-02-08 NOTE — Progress Notes (Signed)
Assessment & Plan:  1. Hyperlipidemia, unspecified hyperlipidemia type Labs to assess. - atorvastatin (LIPITOR) 40 MG tablet; Take 1 tablet (40 mg total) by mouth every evening.  Dispense: 90 tablet; Refill: 1 - CMP14+EGFR - Lipid panel (not fasting)  2. Prediabetes Labs to assess. - Bayer DCA Hb A1c Waived  3. Gastroesophageal reflux disease, unspecified whether esophagitis present Well controlled on current regimen.  - omeprazole (PRILOSEC) 40 MG capsule; Take 1 capsule (40 mg total) by mouth daily.  Dispense: 90 capsule; Refill: 1 - CMP14+EGFR  4. Anxiety and depression Managed by [facility]. - CMP14+EGFR  5. Arthritis Controlled when taking meloxicam.  - meloxicam (MOBIC) 15 MG tablet; Take 1 tablet (15 mg total) by mouth daily.  Dispense: 90 tablet; Refill: 1 - CMP14+EGFR  6. Obesity (BMI 30.0-34.9) Diet and exercise encouraged.  7. Always tired - Anemia Profile B - TSH - VITAMIN D 25 Hydroxy (Vit-D Deficiency, Fractures)  8. Mixed stress and urge urinary incontinence Started patient on Myrbetriq and referred her to pelvic health physical therapy. Education provided on urinary incontinence.  - mirabegron ER (MYRBETRIQ) 25 MG TB24 tablet; Take 1 tablet (25 mg total) by mouth daily.  Dispense: 30 tablet; Refill: 2 - Ambulatory referral to Physical Therapy   Return in about 6 weeks (around 03/22/2021) for Incontinence (may be telephone).  Hendricks Limes, MSN, APRN, FNP-C [facility]  Subjective:    Patient ID: Julia [name], female    DOB: 05/11/1961, 60 y.o.   MRN: [id number]  Patient Care Team: Loman Brooklyn, FNP as PCP - General (Family Medicine) Fransico Michael (Psychiatry)   Chief Complaint:  Chief Complaint  Patient presents with  . Hyperlipidemia  . Anxiety    Check up of chronic medical conditions     HPI: Julia [name] is a 60 y.o. female presenting on 02/08/2021 for Hyperlipidemia and Anxiety (Check up of chronic  medical conditions )  Hyperlipidemia: patient is tolerating Atorvastatin. The 10-year ASCVD risk score Mikey Bussing DC Jr., et al., 2013) is: 2.9%.  Anxiety/Depression are managed by [facility]. Patient reports she was having trouble sleeping so Dr. Eulas Post increased Gabapentin at night from 300 mg to 600 mg and stopped her Trazodone.   Depression screen [facility] 2/9 02/12/2021 06/28/2020 05/11/2020  Decreased Interest 3 2 0  Down, Depressed, Hopeless 2 2 0  PHQ - 2 Score 5 4 0  Altered sleeping 3 2 -  Tired, decreased energy 3 3 -  Change in appetite 2 1 -  Feeling bad or failure about yourself  1 2 -  Trouble concentrating 3 1 -  Moving slowly or fidgety/restless 2 1 -  Suicidal thoughts 1 1 -  PHQ-9 Score 20 15 -  Difficult doing work/chores Very difficult - -   Thoughts are that she would be better off dead, not of harming herself.   GAD 7 : Generalized Anxiety Score 02/12/2021 06/28/2020 05/03/2020 12/23/2019  Nervous, Anxious, on Edge 2 2 3 2   Control/stop worrying 2 2 0 2  Worry too much - different things 3 2 3 2   Trouble relaxing 2 1 3 2   Restless 1 1 2 1   Easily annoyed or irritable 2 2 2 1   Afraid - awful might happen 2 2 1 1   Total GAD 7 Score 14 12 14 11   Anxiety Difficulty Very difficult - Somewhat difficult Somewhat difficult    Pain: reports she ran out of Meloxicam so her pain has been worse recently.  New complaints: Patient reports she feels tired all the time and will just fall asleep if she is sitting around the house.   Patient reports urinary incontinence. Sometimes does not make it to the bathroom. Leaks when she coughs and sneezes.   Social history:  Relevant past medical, surgical, family and social history reviewed and updated as indicated. Interim medical history since our last visit reviewed.  Allergies and medications reviewed and updated.  DATA REVIEWED: CHART IN EPIC  ROS: Negative unless specifically indicated above in HPI.    Current Outpatient  Medications:  .  atorvastatin (LIPITOR) 40 MG tablet, Take 1 tablet (40 mg total) by mouth every evening., Disp: 90 tablet, Rfl: 1 .  buPROPion (WELLBUTRIN XL) 300 MG 24 hr tablet, Take 300 mg by mouth daily. , Disp: , Rfl:  .  fluticasone furoate-vilanterol (BREO ELLIPTA) 100-25 MCG/INH AEPB, Inhale 1 puff into the lungs daily., Disp: 1 each, Rfl: 3 .  gabapentin (NEURONTIN) 300 MG capsule, Take 1 capsule (300 mg total) by mouth 2 (two) times daily. (Patient taking differently: Take 300 mg by mouth 2 (two) times daily. 1 capsule in the morning and 2 at night), Disp: 180 capsule, Rfl: 1 .  meloxicam (MOBIC) 15 MG tablet, Take 1 tablet by mouth daily as needed., Disp: , Rfl:  .  omeprazole (PRILOSEC) 40 MG capsule, Take 1 capsule (40 mg total) by mouth daily. (NEEDS TO BE SEEN BEFORE NEXT REFILL), Disp: 30 capsule, Rfl: 0 .  Probiotic Product (PROBIOTIC DAILY PO), Take 1 tablet by mouth daily., Disp: , Rfl:  .  sertraline (ZOLOFT) 100 MG tablet, Take 200 mg by mouth daily. , Disp: , Rfl:    Allergies  Allergen Reactions  . Lamictal [Lamotrigine] Itching and Swelling   Past Medical History:  Diagnosis Date  . Anxiety   . Arthritis   . Complication of anesthesia    slow to wake up-does not take much medicine to sedate her  . Depression   . Esophageal reflux   . Hyperlipidemia   . Paget disease of bone   . Pneumonia due to COVID-19 virus 05/20/2020  . Wears glasses     Past Surgical History:  Procedure Laterality Date  . ABDOMINAL HYSTERECTOMY  8/11   TAH  . ANTERIOR FUSION CERVICAL SPINE  10/2019   C3-C5  . CERVICAL SPINE SURGERY  1/13  . DILATION AND CURETTAGE OF UTERUS  1986  . KNEE ARTHROSCOPY Left 07/22/2013   Procedure: LEFT KNEE ARTHROSCOPY WITH DEBRIDEMENT CHONDROPLASTY, REMOVAL LOOSE BODIES, PARTIAL MEDIAL MENISCECTOMY, OPEN EXCISION OF PES GANGLION;  Surgeon: Kerin Salen, MD;  Location: [facility];  Service: Orthopedics;  Laterality: Left;  NO SPECIMEN SENT  PER DR. Mayer Camel ORDER.  . TUBAL LIGATION  1989    Social History   Socioeconomic History  . Marital status: Divorced    Spouse name: Not on file  . Number of children: Not on file  . Years of education: Not on file  . Highest education level: Not on file  Occupational History  . Not on file  Tobacco Use  . Smoking status: Former Smoker    Packs/day: 0.15    Years: 5.00    Pack years: 0.75    Quit date: 01/02/1985    Years since quitting: 36.1  . Smokeless tobacco: Never Used  . Tobacco comment: 1 pack per week per pt.   Vaping Use  . Vaping Use: Never used  Substance and Sexual Activity  .  Alcohol use: Yes    Alcohol/week: 1.0 standard drink    Types: 1 Glasses of wine per week    Comment: occassional  . Drug use: No  . Sexual activity: Not Currently  Other Topics Concern  . Not on file  Social History Narrative  . Not on file   Social Determinants of Health   Financial Resource Strain: Not on file  Food Insecurity: Not on file  Transportation Needs: Not on file  Physical Activity: Not on file  Stress: Not on file  Social Connections: Not on file  Intimate Partner Violence: Not on file        Objective:    BP 129/80   Pulse 76   Temp 97.6 F (36.4 C) (Temporal)   Ht 5' 1"  (1.549 m)   Wt 167 lb 6.4 oz (75.9 kg)   LMP 01/02/2010   SpO2 97%   BMI 31.63 kg/m   Wt Readings from Last 3 Encounters:  02/08/21 167 lb 6.4 oz (75.9 kg)  10/10/20 160 lb (72.6 kg)  08/22/20 160 lb 4 oz (72.7 kg)    Physical Exam Vitals reviewed.  Constitutional:      General: She is not in acute distress.    Appearance: Normal appearance. She is obese. She is not ill-appearing, toxic-appearing or diaphoretic.  HENT:     Head: Normocephalic and atraumatic.  Eyes:     General: No scleral icterus.       Right eye: No discharge.        Left eye: No discharge.     Conjunctiva/sclera: Conjunctivae normal.  Cardiovascular:     Rate and Rhythm: Normal rate and regular rhythm.      Heart sounds: Normal heart sounds. No murmur heard. No friction rub. No gallop.   Pulmonary:     Effort: Pulmonary effort is normal. No respiratory distress.     Breath sounds: Normal breath sounds. No stridor. No wheezing, rhonchi or rales.  Musculoskeletal:        General: Normal range of motion.     Cervical back: Normal range of motion.  Skin:    General: Skin is warm and dry.     Capillary Refill: Capillary refill takes less than 2 seconds.  Neurological:     General: No focal deficit present.     Mental Status: She is alert and oriented to person, place, and time. Mental status is at baseline.  Psychiatric:        Mood and Affect: Mood normal.        Behavior: Behavior normal.        Thought Content: Thought content normal.        Judgment: Judgment normal.     Lab Results  Component Value Date   TSH 1.096 07/06/2015   Lab Results  Component Value Date   WBC 7.4 06/09/2020   HGB 11.9 06/09/2020   HCT 38.0 06/09/2020   MCV 94 06/09/2020   PLT 359 06/09/2020   Lab Results  Component Value Date   NA 143 06/09/2020   K 4.4 06/09/2020   CO2 26 06/09/2020   GLUCOSE 97 06/09/2020   BUN 9 06/09/2020   CREATININE 0.87 06/09/2020   BILITOT 0.2 06/09/2020   ALKPHOS 94 06/09/2020   AST 14 06/09/2020   ALT 12 06/09/2020   PROT 6.7 06/09/2020   ALBUMIN 4.0 06/09/2020   CALCIUM 9.5 06/09/2020   ANIONGAP 11 05/29/2020   Lab Results  Component Value Date   CHOL 211 (  H) 06/23/2020   Lab Results  Component Value Date   HDL 63 06/23/2020   Lab Results  Component Value Date   LDLCALC 128 (H) 06/23/2020   Lab Results  Component Value Date   TRIG 116 06/23/2020   Lab Results  Component Value Date   CHOLHDL 3.3 06/23/2020   Lab Results  Component Value Date   HGBA1C 6.4 (H) 05/29/2020

## 2021-02-08 NOTE — Patient Instructions (Signed)

## 2021-02-09 LAB — ANEMIA PROFILE B
Basophils Absolute: 0 10*3/uL (ref 0.0–0.2)
Basos: 1 %
EOS (ABSOLUTE): 0.1 10*3/uL (ref 0.0–0.4)
Eos: 2 %
Ferritin: 8 ng/mL — ABNORMAL LOW (ref 15–150)
Folate: 8.7 ng/mL (ref >3.0)
Hematocrit: 37.2 % (ref 34.0–46.6)
Hemoglobin: 11.8 g/dL (ref 11.1–15.9)
Immature Grans (Abs): 0 10*3/uL (ref 0.0–0.1)
Immature Granulocytes: 0 %
Iron Saturation: 14 % — ABNORMAL LOW (ref 15–55)
Iron: 47 ug/dL (ref 27–159)
Lymphocytes Absolute: 0.8 10*3/uL (ref 0.7–3.1)
Lymphs: 16 %
MCH: 28.4 pg (ref 26.6–33.0)
MCHC: 31.7 g/dL (ref 31.5–35.7)
MCV: 89 fL (ref 79–97)
Monocytes Absolute: 0.4 10*3/uL (ref 0.1–0.9)
Monocytes: 8 %
Neutrophils Absolute: 3.6 10*3/uL (ref 1.4–7.0)
Neutrophils: 73 %
Platelets: 244 10*3/uL (ref 150–450)
RBC: 4.16 x10E6/uL (ref 3.77–5.28)
RDW: 13.3 % (ref 11.7–15.4)
Retic Ct Pct: 1.4 % (ref 0.6–2.6)
Total Iron Binding Capacity: 333 ug/dL (ref 250–450)
UIBC: 286 ug/dL (ref 131–425)
Vitamin B-12: 204 pg/mL — ABNORMAL LOW (ref 232–1245)
WBC: 4.9 10*3/uL (ref 3.4–10.8)

## 2021-02-09 LAB — VITAMIN D 25 HYDROXY (VIT D DEFICIENCY, FRACTURES): Vit D, 25-Hydroxy: 24.9 ng/mL — ABNORMAL LOW (ref 30.0–100.0)

## 2021-02-09 LAB — CMP14+EGFR
ALT: 12 IU/L (ref 0–32)
AST: 18 IU/L (ref 0–40)
Albumin/Globulin Ratio: 1.7 (ref 1.2–2.2)
Albumin: 4.4 g/dL (ref 3.8–4.9)
Alkaline Phosphatase: 86 IU/L (ref 44–121)
BUN/Creatinine Ratio: 16 (ref 9–23)
BUN: 15 mg/dL (ref 6–24)
Bilirubin Total: 0.3 mg/dL (ref 0.0–1.2)
CO2: 22 mmol/L (ref 20–29)
Calcium: 9.4 mg/dL (ref 8.7–10.2)
Chloride: 107 mmol/L — ABNORMAL HIGH (ref 96–106)
Creatinine, Ser: 0.91 mg/dL (ref 0.57–1.00)
Globulin, Total: 2.6 g/dL (ref 1.5–4.5)
Glucose: 98 mg/dL (ref 65–99)
Potassium: 4.7 mmol/L (ref 3.5–5.2)
Sodium: 144 mmol/L (ref 134–144)
Total Protein: 7 g/dL (ref 6.0–8.5)
eGFR: 73 mL/min/{1.73_m2} (ref >59)

## 2021-02-09 LAB — LIPID PANEL
Chol/HDL Ratio: 2.8 ratio (ref 0.0–4.4)
Cholesterol, Total: 206 mg/dL — ABNORMAL HIGH (ref 100–199)
HDL: 74 mg/dL (ref >39)
LDL Chol Calc (NIH): 118 mg/dL — ABNORMAL HIGH (ref 0–99)
Triglycerides: 76 mg/dL (ref 0–149)
VLDL Cholesterol Cal: 14 mg/dL (ref 5–40)

## 2021-02-09 LAB — TSH: TSH: 2.1 u[IU]/mL (ref 0.450–4.500)

## 2021-02-10 ENCOUNTER — Encounter: Payer: Self-pay | Admitting: Family Medicine

## 2021-02-10 DIAGNOSIS — E538 Deficiency of other specified B group vitamins: Secondary | ICD-10-CM

## 2021-02-10 DIAGNOSIS — R790 Abnormal level of blood mineral: Secondary | ICD-10-CM | POA: Insufficient documentation

## 2021-02-10 DIAGNOSIS — E559 Vitamin D deficiency, unspecified: Secondary | ICD-10-CM

## 2021-02-10 HISTORY — DX: Abnormal level of blood mineral: R79.0

## 2021-02-10 HISTORY — DX: Vitamin D deficiency, unspecified: E55.9

## 2021-02-10 HISTORY — DX: Deficiency of other specified B group vitamins: E53.8

## 2021-02-12 DIAGNOSIS — M1990 Unspecified osteoarthritis, unspecified site: Secondary | ICD-10-CM | POA: Insufficient documentation

## 2021-02-12 DIAGNOSIS — N3946 Mixed incontinence: Secondary | ICD-10-CM | POA: Insufficient documentation

## 2021-02-12 DIAGNOSIS — E669 Obesity, unspecified: Secondary | ICD-10-CM | POA: Insufficient documentation

## 2021-02-12 DIAGNOSIS — R7303 Prediabetes: Secondary | ICD-10-CM | POA: Insufficient documentation

## 2021-02-20 DIAGNOSIS — H5213 Myopia, bilateral: Secondary | ICD-10-CM | POA: Diagnosis not present

## 2021-02-20 DIAGNOSIS — H5203 Hypermetropia, bilateral: Secondary | ICD-10-CM | POA: Diagnosis not present

## 2021-02-20 DIAGNOSIS — H524 Presbyopia: Secondary | ICD-10-CM | POA: Diagnosis not present

## 2021-02-20 DIAGNOSIS — H52229 Regular astigmatism, unspecified eye: Secondary | ICD-10-CM | POA: Diagnosis not present

## 2021-03-22 ENCOUNTER — Encounter: Payer: Self-pay | Admitting: Family Medicine

## 2021-03-22 ENCOUNTER — Telehealth (INDEPENDENT_AMBULATORY_CARE_PROVIDER_SITE_OTHER): Payer: Medicare HMO | Admitting: Family Medicine

## 2021-03-22 DIAGNOSIS — N3946 Mixed incontinence: Secondary | ICD-10-CM

## 2021-03-22 MED ORDER — MIRABEGRON ER 25 MG PO TB24: 25.0000 mg | ORAL_TABLET | Freq: Every day | ORAL | 1 refills | Status: DC

## 2021-03-22 NOTE — Progress Notes (Signed)
Virtual Visit via Telephone Note  I connected with Julia [name] on 03/22/21 at 1:54 PM by telephone and verified that I am speaking with the correct person using two identifiers. Julia [name] is currently located at home and nobody is currently with her during this visit. The provider, Gwenlyn Fudge, FNP is located in their office at time of visit.  I discussed the limitations, risks, security and privacy concerns of performing an evaluation and management service by telephone and the availability of in person appointments. I also discussed with the patient that there may be a patient responsible charge related to this service. The patient expressed understanding and agreed to proceed.  Subjective: PCP: Gwenlyn Fudge, FNP  Chief Complaint  Patient presents with   Urinary Incontinence   Patient is following up on urinary incontinence.  She was started on Myrbetriq 25 mg once daily and referred to pelvic health physical therapy.  She has an appointment coming up on 04/05/2021 for therapy.  She reports she has had no accidents since starting the Myrbetriq.  She does report cost about $100 per month.   ROS: Per HPI  Current Outpatient Medications:    atorvastatin (LIPITOR) 40 MG tablet, Take 1 tablet (40 mg total) by mouth every evening., Disp: 90 tablet, Rfl: 1   buPROPion (WELLBUTRIN XL) 300 MG 24 hr tablet, Take 300 mg by mouth daily. , Disp: , Rfl:    fluticasone furoate-vilanterol (BREO ELLIPTA) 100-25 MCG/INH AEPB, Inhale 1 puff into the lungs daily., Disp: 1 each, Rfl: 3   gabapentin (NEURONTIN) 300 MG capsule, Take 1 capsule (300 mg total) by mouth 2 (two) times daily. (Patient taking differently: Take 300 mg by mouth 2 (two) times daily. 1 capsule in the morning and 2 at night), Disp: 180 capsule, Rfl: 1   meloxicam (MOBIC) 15 MG tablet, Take 1 tablet (15 mg total) by mouth daily., Disp: 90 tablet, Rfl: 1   mirabegron ER (MYRBETRIQ) 25 MG TB24 tablet, Take 1 tablet (25 mg  total) by mouth daily., Disp: 30 tablet, Rfl: 2   omeprazole (PRILOSEC) 40 MG capsule, Take 1 capsule (40 mg total) by mouth daily., Disp: 90 capsule, Rfl: 1   Probiotic Product (PROBIOTIC DAILY PO), Take 1 tablet by mouth daily., Disp: , Rfl:    sertraline (ZOLOFT) 100 MG tablet, Take 200 mg by mouth daily. , Disp: , Rfl:   Allergies  Allergen Reactions   Lamictal [Lamotrigine] Itching and Swelling   Past Medical History:  Diagnosis Date   Anxiety    Arthritis    Complication of anesthesia    slow to wake up-does not take much medicine to sedate her   Depression    Esophageal reflux    Hyperlipidemia    Low ferritin 02/10/2021   Paget disease of bone    Pneumonia due to COVID-19 virus 05/20/2020   Vitamin B12 deficiency 02/10/2021   Vitamin D insufficiency 02/10/2021   Wears glasses     Observations/Objective: A&O  No respiratory distress or wheezing audible over the phone Mood, judgement, and thought processes all WNL   Assessment and Plan: 1. Mixed stress and urge urinary incontinence Well controlled on current regimen.  Prescription sent to [facility] to see if it will be cheaper since it is a preferred pharmacy for her.  Encouraged her to call her insurance and see if she can get it cheaper if she used a [employer].  Patient to keep appointment with pelvic health physical  therapist next month. - mirabegron ER (MYRBETRIQ) 25 MG TB24 tablet; Take 1 tablet (25 mg total) by mouth daily.  Dispense: 90 tablet; Refill: 1   Follow Up Instructions: Return in about 3 months (around 06/22/2021) for follow-up of chronic medication conditions.  I discussed the assessment and treatment plan with the patient. The patient was provided an opportunity to ask questions and all were answered. The patient agreed with the plan and demonstrated an understanding of the instructions.   The patient was advised to call back or seek an in-person evaluation if the symptoms worsen or if the  condition fails to improve as anticipated.  The above assessment and management plan was discussed with the patient. The patient verbalized understanding of and has agreed to the management plan. Patient is aware to call the clinic if symptoms persist or worsen. Patient is aware when to return to the clinic for a follow-up visit. Patient educated on when it is appropriate to go to the emergency department.   Time call ended: 2:08 PM  I provided 14 minutes of non-face-to-face time during this encounter.  [name], MSN, APRN, FNP-C [facility] 03/22/21

## 2021-04-05 ENCOUNTER — Encounter: Payer: Self-pay | Admitting: Physical Therapy

## 2021-04-05 ENCOUNTER — Other Ambulatory Visit: Payer: Self-pay

## 2021-04-05 ENCOUNTER — Ambulatory Visit: Payer: Medicare HMO | Attending: Family Medicine | Admitting: Physical Therapy

## 2021-04-05 DIAGNOSIS — R2689 Other abnormalities of gait and mobility: Secondary | ICD-10-CM | POA: Insufficient documentation

## 2021-04-05 DIAGNOSIS — M6281 Muscle weakness (generalized): Secondary | ICD-10-CM | POA: Diagnosis not present

## 2021-04-05 DIAGNOSIS — N3946 Mixed incontinence: Secondary | ICD-10-CM

## 2021-04-05 DIAGNOSIS — R279 Unspecified lack of coordination: Secondary | ICD-10-CM

## 2021-04-05 NOTE — Patient Instructions (Signed)
  [email or web address]   Access Code: [email or web address] URL: [email or web address] Date: 04/05/2021 Prepared by: Otelia Sergeant  Exercises Supine Diaphragmatic Breathing - 1 x daily - 7 x weekly - 3 sets - 10 reps Supine Hip Adductor Stretch - 1 x daily - 7 x weekly - 2 sets - 5 reps - 45s hold Supine Lower Trunk Rotation - 1 x daily - 7 x weekly - 2 sets - 5 reps - 10s hold

## 2021-04-05 NOTE — Therapy (Signed)
[facility] [address], [address] [place], [place], [address] Phone: [phone]   Fax:  [phone]  Physical Therapy Evaluation  Patient Details  Name: Julia [name] MRN: [id number] Date of Birth: 06-13-1961 Referring Provider (PT): Gwenlyn Fudge, FNP   Encounter Date: 04/05/2021   PT End of Session - 04/05/21 1325     Visit Number 1    Authorization Type Aetna    Authorization - Visit Number 1    PT Start Time 1230    PT Stop Time 1316    PT Time Calculation (min) 46 min    Activity Tolerance Patient tolerated treatment well;No increased pain    Behavior During Therapy [facility] for tasks assessed/performed             Past Medical History:  Diagnosis Date   Anxiety    Arthritis    Complication of anesthesia    slow to wake up-does not take much medicine to sedate her   Depression    Esophageal reflux    Hyperlipidemia    Low ferritin 02/10/2021   Paget disease of bone    Pneumonia due to COVID-19 virus 05/20/2020   Vitamin B12 deficiency 02/10/2021   Vitamin D insufficiency 02/10/2021   Wears glasses     Past Surgical History:  Procedure Laterality Date   ABDOMINAL HYSTERECTOMY  8/11   TAH   ANTERIOR FUSION CERVICAL SPINE  10/2019   C3-C5   CERVICAL SPINE SURGERY  1/13   DILATION AND CURETTAGE OF UTERUS  1986   KNEE ARTHROSCOPY Left 07/22/2013   Procedure: LEFT KNEE ARTHROSCOPY WITH DEBRIDEMENT CHONDROPLASTY, REMOVAL LOOSE BODIES, PARTIAL MEDIAL MENISCECTOMY, OPEN EXCISION OF PES GANGLION;  Surgeon: [name], MD;  Location: [facility];  Service: Orthopedics;  Laterality: Left;  NO SPECIMEN SENT PER DR. [name] ORDER.   TUBAL LIGATION  1989    There were no vitals filed for this visit.    Subjective Assessment - 04/05/21 1235     Subjective Pt reports medication has helped with urinary incontience but would like to improve without medication. Pt reports she been having leakage for  6 months to a year and reports she does experience a full loss of urine sometimes and fairly constant leakage with all mobility and while sleeping.    Patient Stated Goals to have less leakage    Currently in Pain? No/denies                [facility] PT Assessment - 04/05/21 0001       Assessment   Medical Diagnosis N39.46 (ICD-10-CM) - Mixed stress and urge urinary incontinence    Referring Provider (PT) Gwenlyn Fudge, FNP    Onset Date/Surgical Date --   at least 6 months to a year ago   Prior Therapy not for pelvic floor but has had PT for other orthopedic issues      Precautions   Precautions None      Restrictions   Weight Bearing Restrictions No      Balance Screen   Has the patient fallen in the past 6 months No   however pt does report she has noticed decreased balance and reports a decrease vision in bil eyes in lower quadrant   Has the patient had a decrease in activity level because of a fear of falling?  No    Is the patient reluctant to leave their home because of a fear of falling?  No  Home Environment   Living Environment Private residence    Living Arrangements Spouse/significant other    Available Help at Discharge Family    Type of Home House    Home Access Stairs to enter    Entrance Stairs-Number of Steps 2-3    Home Layout One level      Prior Function   Level of Independence Independent    Vocation On disability      Cognition   Overall Cognitive Status Within Functional Limits for tasks assessed      Sensation   Light Touch Impaired by gross assessment   decreased in Bil hands     Coordination   Gross Motor Movements are Fluid and Coordinated Yes      Posture/Postural Control   Posture/Postural Control Postural limitations    Postural Limitations Rounded Shoulders;Increased thoracic kyphosis;Posterior pelvic tilt      ROM / Strength   AROM / PROM / Strength AROM;Strength      AROM   Overall AROM  Deficits    AROM Assessment Site  Lumbar;Thoracic    Lumbar Flexion limited by 50%    Lumbar Extension limited by 50%    Lumbar - Right Side Bend limited by 50%    Lumbar - Left Side Bend limited by 50%    Lumbar - Right Rotation limited by 50%    Lumbar - Left Rotation limited by 50%    Thoracic Flexion limited by 50%    Thoracic Extension limited by 50%    Thoracic - Right Side Bend limited by 50%    Thoracic - Left Side Bend limited by 50%    Thoracic - Right Rotation limited by 50%    Thoracic - Left Rotation limited by 50%      Strength   Overall Strength Deficits    Strength Assessment Site Lumbar;Thoracic;Hip    Right/Left Hip Left;Right    Right Hip Flexion 4/5    Right Hip Extension 4/5    Right Hip External Rotation  4/5    Right Hip Internal Rotation 4/5    Right Hip ABduction 4/5    Right Hip ADduction 4/5    Left Hip Flexion 3+/5    Left Hip Extension 3+/5    Left Hip External Rotation 3+/5    Left Hip Internal Rotation 3+/5    Left Hip ABduction 3/5    Left Hip ADduction 3+/5      Flexibility   Soft Tissue Assessment /Muscle Length yes   bil adductors limited by 50%   Hamstrings limited by 50%    Quadratus Lumborum limited by 50%                        Objective measurements completed on examination: See above findings.     Pelvic Floor Special Questions - 04/05/21 0001     Are you Pregnant or attempting pregnancy? No    Prior Pregnancies Yes    Number of Pregnancies 4   one miscarriage   Number of C-Sections 3    Any difficulty with labor and deliveries Yes   reports 3 large babies and need of c-section for all with vertical scar   Currently Sexually Active No    History of sexually transmitted disease No    Urinary Leakage Yes    How often daily, constant    Pad use 0   pt reports she doesn't use pads   Activities that cause leaking With strong urge;Coughing;Sneezing;Laughing;Lifting;Walking;Bending  Urinary urgency Yes    Urinary frequency 6-8 times per day     Fecal incontinence No   does have chronic constipation, with type 1-2 on Bristol Stool scale and feels she needs to use mulitple wipes after BMs to get clean . Pt also reports she has BM dail with daily use of Merilax   Fluid intake reports 8-10 20oz water bottles per day    Falling out feeling (prolapse) No    Pelvic Floor Internal Exam patient identified and patient confirms consent for PT to perform inernal soft tissue work and muscle strength and integrity assessment    Exam Type Vaginal    Palpation noted tenderness in all four quadrants at bulbocavernosis, ischiocavernosis and unable to assess deeper muscles due to profound tightness and inability to relax muscles also did not want to increase pt discomfort.    Strength good squeeze, good lift, able to hold agaisnt strong resistance    Strength # of reps 6    Strength # of seconds 5    Tone increased, poor ability to relax                      PT Education - 04/05/21 1726     Education Details Pt educated on pelvic relaxation, diaphragmatic breathing and HEP and findings of exam    Person(s) Educated Patient    Methods Explanation;Handout;Verbal cues;Tactile cues    Comprehension Verbalized understanding;Returned demonstration              PT Short Term Goals - 04/05/21 1355       PT SHORT TERM GOAL #1   Title pt to be I with HEP    Time 6    Period Weeks    Status New    Target Date 05/17/21      PT SHORT TERM GOAL #2   Title pt to demonstrate improved hip strength bilaterally to at least 4+/5 globally for improved functional mobility    Time 6    Period Weeks    Status New    Target Date 05/17/21      PT SHORT TERM GOAL #3   Title pt to report decreased urinary leakage to no more than 5x per day    Time 6    Period Weeks    Status New    Target Date 05/17/21      PT SHORT TERM GOAL #4   Title pt to report bladder voiding once every hour to decrease leakage and frequency    Time 6    Period  Weeks    Status New    Target Date 05/17/21      PT SHORT TERM GOAL #5   Title pt to demonstrate improved coordination of contract/relax of pelvic floor internally 50% of the time to assist in decreased urinary leakage.    Time 6    Period Weeks    Status New    Target Date 05/17/21               PT Long Term Goals - 04/05/21 1402       PT LONG TERM GOAL #1   Title pt to be I with advanced HEP    Time 4    Period Months    Status New    Target Date 08/06/21      PT LONG TERM GOAL #2   Title pt to report improved urinary leakage to no more than 3x per day  Time 4    Period Months    Status New    Target Date 08/06/21      PT LONG TERM GOAL #3   Title pt to report improved time between bladder voids to at least every 2 hours for decreased frequency and leakage    Time 4    Period Months    Status New    Target Date 08/06/21      PT LONG TERM GOAL #4   Title pt to demonstrate improved contract/relax of internal pelvic floor to 75% of the time    Time 4    Period Months    Status New    Target Date 08/06/21                    Plan - 04/05/21 1326     Clinical Impression Statement Pt is 60 yo female presenting with history of mixed urinary incontinence and chronic constipation with one BM per day with use of Merilax and a reported type 1-2 on Bristol Stool scale. Pt reported urinary leakage with all mobility throughout the day and has had a complete loss of urine sometimes. Pt reports she intermittently has a full bladder void but also reports she could constantly have urine leakage. During session pt found to have weakness in bil hips and core and decreased flexibility in bil hips, thoracic and lumbar spine, decreased c-section scar mobility, and tension/tightnes noted throoughout abdomen. With internal pelvic exam pt found to be tight throughtout with noted tenderness in all four quadrants and pt unable to fully relax from contractions. pt would benefit  from PT for improved relaxation of pelvic floor, improved bil hip and core strength, hip and spine flexibility and decreased urinary leakage.    Personal Factors and Comorbidities Age;Time since onset of injury/illness/exacerbation    Examination-Activity Limitations Locomotion Level;Continence;Lift;Stand;Stairs;Squat;Sit    Examination-Participation Restrictions Community Activity;Interpersonal Relationship    Stability/Clinical Decision Making Evolving/Moderate complexity    Clinical Decision Making Moderate    Rehab Potential Good    PT Frequency 1x / week    PT Duration 12 weeks    PT Treatment/Interventions ADLs/Self Care Home Management;Functional mobility training;Therapeutic activities;Therapeutic exercise;Neuromuscular re-education;Manual techniques;Patient/family education;Scar mobilization;Passive range of motion    PT Next Visit Plan relaxation, hip stretches, scar mob    PT Home Exercise Plan [email or web address]    Consulted and Agree with Plan of Care Patient             Patient will benefit from skilled therapeutic intervention in order to improve the following deficits and impairments:  Decreased coordination, Decreased range of motion, Increased fascial restricitons, Impaired tone, Decreased endurance, Pain, Decreased activity tolerance, Decreased mobility, Decreased scar mobility, Decreased strength, Postural dysfunction, Improper body mechanics, Impaired flexibility  Visit Diagnosis: Muscle weakness (generalized) - Plan: PT plan of care cert/re-cert  Unspecified lack of coordination - Plan: PT plan of care cert/re-cert  Mixed incontinence - Plan: PT plan of care cert/re-cert     Problem List Patient Active Problem List   Diagnosis Date Noted   Arthritis 02/12/2021   Prediabetes 02/12/2021   Mixed stress and urge urinary incontinence 02/12/2021   Obesity (BMI 30.0-34.9) 02/12/2021   Low ferritin 02/10/2021   Vitamin D insufficiency 02/10/2021   Vitamin B12 deficiency  02/10/2021   COVID-19 long hauler 10/13/2020   Neuropathy 08/22/2020   Chronic pain 02/10/2016   Paget disease of bone 12/28/2015   Esophageal reflux 08/15/2015   Hyperlipidemia 08/15/2015   Chronic  pain in shoulder 08/15/2015   Anxiety and depression 11/25/2013   IBS (irritable bowel syndrome) 01/02/2013    Otelia Sergeant, PT 07/06/225:28 PM   [facility] [address] [place], [place], [address] Phone: [phone]   Fax:  [phone]  Name: Julia [name] MRN: [id number] Date of Birth: Aug 31, 1961

## 2021-04-17 ENCOUNTER — Encounter: Payer: Self-pay | Admitting: Physical Therapy

## 2021-04-17 ENCOUNTER — Ambulatory Visit: Payer: Medicare HMO | Admitting: Physical Therapy

## 2021-04-17 ENCOUNTER — Other Ambulatory Visit: Payer: Self-pay

## 2021-04-17 DIAGNOSIS — N3946 Mixed incontinence: Secondary | ICD-10-CM | POA: Diagnosis not present

## 2021-04-17 DIAGNOSIS — R279 Unspecified lack of coordination: Secondary | ICD-10-CM | POA: Diagnosis not present

## 2021-04-17 DIAGNOSIS — R2689 Other abnormalities of gait and mobility: Secondary | ICD-10-CM

## 2021-04-17 DIAGNOSIS — M6281 Muscle weakness (generalized): Secondary | ICD-10-CM

## 2021-04-17 NOTE — Patient Instructions (Signed)
Access Code: [email or web address] URL: [email or web address] Date: 04/17/2021 Prepared by: [facility]  Exercises Supine Diaphragmatic Breathing - 1 x daily - 7 x weekly - 2 sets - 10 reps Supine Hip Adductor Stretch - 1 x daily - 7 x weekly - 2 sets - 3 reps - 45s hold Sidelying Thoracic Rotation with Open Book - 1 x daily - 7 x weekly - 2 sets - 3 reps - 30 hold  [facility] [address] [place], [place] [address] Phone # [phone] Fax [phone]

## 2021-04-17 NOTE — Therapy (Signed)
[facility] [address], [address] [place], [place], [address] Phone: [phone]   Fax:  [phone]  Physical Therapy Treatment  Patient Details  Name: Julia [name] MRN: [id number] Date of Birth: 1961-06-08 Referring Provider (PT): Gwenlyn Fudge, FNP   Encounter Date: 04/17/2021   PT End of Session - 04/17/21 1319     Visit Number 2    Authorization Type Aetna    Authorization - Visit Number 2    PT Start Time 1230    PT Stop Time 1313    PT Time Calculation (min) 43 min    Activity Tolerance Patient tolerated treatment well;No increased pain    Behavior During Therapy [facility] for tasks assessed/performed             Past Medical History:  Diagnosis Date   Anxiety    Arthritis    Complication of anesthesia    slow to wake up-does not take much medicine to sedate her   Depression    Esophageal reflux    Hyperlipidemia    Low ferritin 02/10/2021   Paget disease of bone    Pneumonia due to COVID-19 virus 05/20/2020   Vitamin B12 deficiency 02/10/2021   Vitamin D insufficiency 02/10/2021   Wears glasses     Past Surgical History:  Procedure Laterality Date   ABDOMINAL HYSTERECTOMY  8/11   TAH   ANTERIOR FUSION CERVICAL SPINE  10/2019   C3-C5   CERVICAL SPINE SURGERY  1/13   DILATION AND CURETTAGE OF UTERUS  1986   KNEE ARTHROSCOPY Left 07/22/2013   Procedure: LEFT KNEE ARTHROSCOPY WITH DEBRIDEMENT CHONDROPLASTY, REMOVAL LOOSE BODIES, PARTIAL MEDIAL MENISCECTOMY, OPEN EXCISION OF PES GANGLION;  Surgeon: [name], MD;  Location: [facility];  Service: Orthopedics;  Laterality: Left;  NO SPECIMEN SENT PER DR. [name] ORDER.   TUBAL LIGATION  1989    There were no vitals filed for this visit.   Subjective Assessment - 04/17/21 1234     Subjective Pt reports having continued leakage with activity but no full loss of urine. Pt reports she did attempt HEP and relaxation techniques which she  feels has improved pelvic tightness.    Currently in Pain? No/denies                               [facility] Adult PT Treatment/Exercise - 04/17/21 0001       Exercises   Exercises Lumbar;Knee/Hip      Lumbar Exercises: Stretches   Single Knee to Chest Stretch 3 reps;20 seconds;Left;Right    Pelvic Tilt 10 reps   on pool noodle with Rt and LT tilts for pelvic floor release   Piriformis Stretch 3 reps;20 seconds;Left;Right      Lumbar Exercises: Seated   Hip Flexion on Ball 10 reps   with opposite arm raises   Other Seated Lumbar Exercises hip shifts on ball x10 toes forward    Other Seated Lumbar Exercises palloffs with red band in sitting x10 each side with TA activation, palloffs with red band with rotation x10 each. tactile anc verbal cues for technique      Lumbar Exercises: Sidelying   Other Sidelying Lumbar Exercises diaphragmatic breathing x10 each side with manual work for rib mobility with breathing    Other Sidelying Lumbar Exercises reverse clams x10 each  PT Education - 04/17/21 1319     Education Details Pt educated on technique with all exercises, breathing pattern and HEP    Person(s) Educated Patient    Methods Explanation;Demonstration;Tactile cues;Verbal cues;Handout    Comprehension Verbalized understanding;Returned demonstration              PT Short Term Goals - 04/05/21 1355       PT SHORT TERM GOAL #1   Title pt to be I with HEP    Time 6    Period Weeks    Status New    Target Date 05/17/21      PT SHORT TERM GOAL #2   Title pt to demonstrate improved hip strength bilaterally to at least 4+/5 globally for improved functional mobility    Time 6    Period Weeks    Status New    Target Date 05/17/21      PT SHORT TERM GOAL #3   Title pt to report decreased urinary leakage to no more than 5x per day    Time 6    Period Weeks    Status New    Target Date 05/17/21      PT SHORT TERM GOAL #4    Title pt to report bladder voiding once every hour to decrease leakage and frequency    Time 6    Period Weeks    Status New    Target Date 05/17/21      PT SHORT TERM GOAL #5   Title pt to demonstrate improved coordination of contract/relax of pelvic floor internally 50% of the time to assist in decreased urinary leakage.    Time 6    Period Weeks    Status New    Target Date 05/17/21               PT Long Term Goals - 04/05/21 1402       PT LONG TERM GOAL #1   Title pt to be I with advanced HEP    Time 4    Period Months    Status New    Target Date 08/06/21      PT LONG TERM GOAL #2   Title pt to report improved urinary leakage to no more than 3x per day    Time 4    Period Months    Status New    Target Date 08/06/21      PT LONG TERM GOAL #3   Title pt to report improved time between bladder voids to at least every 2 hours for decreased frequency and leakage    Time 4    Period Months    Status New    Target Date 08/06/21      PT LONG TERM GOAL #4   Title pt to demonstrate improved contract/relax of internal pelvic floor to 75% of the time    Time 4    Period Months    Status New    Target Date 08/06/21                   Plan - 04/17/21 1319     Clinical Impression Statement PT presenting to clinic with continued incontience and hip/back pain intermittently.Pt declined internal pelvic treatment this session and requested to focus on exercises today.  Pt session focused on core strengthening, hip and back stretching with proper core activation and breathing technique and kegals when appropriate. Pt required frequent VC to complete all tasks correctly and tactile  cues. Pt would benefit from PT for improved relaxation of pelvic floor, improved bil hip and core strength, hip and spine flexibility and decreased urinary leakage.    Personal Factors and Comorbidities Age;Time since onset of injury/illness/exacerbation    Examination-Activity  Limitations Locomotion Level;Continence;Lift;Stand;Stairs;Squat;Sit    Examination-Participation Restrictions Community Activity;Interpersonal Relationship    Stability/Clinical Decision Making Evolving/Moderate complexity    Clinical Decision Making Moderate    Rehab Potential Good    PT Frequency 1x / week    PT Duration 12 weeks    PT Treatment/Interventions ADLs/Self Care Home Management;Functional mobility training;Therapeutic activities;Therapeutic exercise;Neuromuscular re-education;Manual techniques;Patient/family education;Scar mobilization;Passive range of motion    PT Home Exercise Plan [email or web address]             Patient will benefit from skilled therapeutic intervention in order to improve the following deficits and impairments:  Decreased coordination, Decreased range of motion, Increased fascial restricitons, Impaired tone, Decreased endurance, Pain, Decreased activity tolerance, Decreased mobility, Decreased scar mobility, Decreased strength, Postural dysfunction, Improper body mechanics, Impaired flexibility  Visit Diagnosis: Muscle weakness (generalized)  Unspecified lack of coordination  Other abnormalities of gait and mobility     Problem List Patient Active Problem List   Diagnosis Date Noted   Arthritis 02/12/2021   Prediabetes 02/12/2021   Mixed stress and urge urinary incontinence 02/12/2021   Obesity (BMI 30.0-34.9) 02/12/2021   Low ferritin 02/10/2021   Vitamin D insufficiency 02/10/2021   Vitamin B12 deficiency 02/10/2021   COVID-19 long hauler 10/13/2020   Neuropathy 08/22/2020   Chronic pain 02/10/2016   Paget disease of bone 12/28/2015   Esophageal reflux 08/15/2015   Hyperlipidemia 08/15/2015   Chronic pain in shoulder 08/15/2015   Anxiety and depression 11/25/2013   IBS (irritable bowel syndrome) 01/02/2013    Otelia Sergeant, PT 07/18/221:22 PM   [facility] [address] [place], [place], [address] Phone: [phone]   Fax:  [phone]  Name: Julia [name] MRN: [id number] Date of Birth: 1961-07-08

## 2021-05-01 ENCOUNTER — Encounter: Payer: Self-pay | Admitting: Physical Therapy

## 2021-05-01 ENCOUNTER — Ambulatory Visit: Payer: Medicare HMO | Attending: Family Medicine | Admitting: Physical Therapy

## 2021-05-01 ENCOUNTER — Other Ambulatory Visit: Payer: Self-pay

## 2021-05-01 DIAGNOSIS — R279 Unspecified lack of coordination: Secondary | ICD-10-CM | POA: Diagnosis not present

## 2021-05-01 DIAGNOSIS — M6281 Muscle weakness (generalized): Secondary | ICD-10-CM | POA: Insufficient documentation

## 2021-05-01 DIAGNOSIS — R2689 Other abnormalities of gait and mobility: Secondary | ICD-10-CM | POA: Diagnosis not present

## 2021-05-01 NOTE — Therapy (Signed)
[facility] [address] [place], [place], [address] Phone: [phone]   Fax:  [phone]  Physical Therapy Treatment  Patient Details  Name: Julia [name] MRN: [id number] Date of Birth: May 11, 1961 Referring Provider (PT): Gwenlyn Fudge, FNP   Encounter Date: 05/01/2021   PT End of Session - 05/01/21 1314     Visit Number 3    Authorization Type [employer]    Authorization - Visit Number 3    PT Start Time 1230    PT Stop Time 1315    PT Time Calculation (min) 45 min    Activity Tolerance Patient tolerated treatment well;No increased pain    Behavior During Therapy [facility] for tasks assessed/performed             Past Medical History:  Diagnosis Date   Anxiety    Arthritis    Complication of anesthesia    slow to wake up-does not take much medicine to sedate her   Depression    Esophageal reflux    Hyperlipidemia    Low ferritin 02/10/2021   Paget disease of bone    Pneumonia due to COVID-19 virus 05/20/2020   Vitamin B12 deficiency 02/10/2021   Vitamin D insufficiency 02/10/2021   Wears glasses     Past Surgical History:  Procedure Laterality Date   ABDOMINAL HYSTERECTOMY  8/11   TAH   ANTERIOR FUSION CERVICAL SPINE  10/2019   C3-C5   CERVICAL SPINE SURGERY  1/13   DILATION AND CURETTAGE OF UTERUS  1986   KNEE ARTHROSCOPY Left 07/22/2013   Procedure: LEFT KNEE ARTHROSCOPY WITH DEBRIDEMENT CHONDROPLASTY, REMOVAL LOOSE BODIES, PARTIAL MEDIAL MENISCECTOMY, OPEN EXCISION OF PES GANGLION;  Surgeon: [name], MD;  Location: [facility];  Service: Orthopedics;  Laterality: Left;  NO SPECIMEN SENT PER DR. [name] ORDER.   TUBAL LIGATION  1989    There were no vitals filed for this visit.   Subjective Assessment - 05/01/21 1234     Subjective Pt reports she continues to take her medication for urinary leakage and thinks this helps, continues to have urgency and night time urination 2x  per night and reports she does continue to have issues with constipation as well.    Currently in Pain? No/denies                               [facility] Adult PT Treatment/Exercise - 05/01/21 0001       Self-Care   Self-Care Other Self-Care Comments    Other Self-Care Comments  Pt educated on bladder retraining, given bladder diary and information handouts on constipation and urge drill.      Lumbar Exercises: Supine   Other Supine Lumbar Exercises x10 TA activations in hooklying with tactile cues to complete    Other Supine Lumbar Exercises happy baby 2x45s      Manual Therapy   Manual Therapy Soft tissue mobilization    Soft tissue mobilization abdominal massage provided for improved tissue mobility and pt reported continued issues with consitpation. soft tissue at lower abdominal region for improved tissue mobility against bladder and pelvic floor.                    PT Education - 05/01/21 1314     Education Details Pt educated on bladder diary, voiding timing, urge drill and additions to HEP.    Person(s) Educated Patient  Methods Explanation;Demonstration;Tactile cues;Handout;Verbal cues    Comprehension Verbalized understanding;Returned demonstration              PT Short Term Goals - 04/05/21 1355       PT SHORT TERM GOAL #1   Title pt to be I with HEP    Time 6    Period Weeks    Status New    Target Date 05/17/21      PT SHORT TERM GOAL #2   Title pt to demonstrate improved hip strength bilaterally to at least 4+/5 globally for improved functional mobility    Time 6    Period Weeks    Status New    Target Date 05/17/21      PT SHORT TERM GOAL #3   Title pt to report decreased urinary leakage to no more than 5x per day    Time 6    Period Weeks    Status New    Target Date 05/17/21      PT SHORT TERM GOAL #4   Title pt to report bladder voiding once every hour to decrease leakage and frequency    Time 6    Period  Weeks    Status New    Target Date 05/17/21      PT SHORT TERM GOAL #5   Title pt to demonstrate improved coordination of contract/relax of pelvic floor internally 50% of the time to assist in decreased urinary leakage.    Time 6    Period Weeks    Status New    Target Date 05/17/21               PT Long Term Goals - 04/05/21 1402       PT LONG TERM GOAL #1   Title pt to be I with advanced HEP    Time 4    Period Months    Status New    Target Date 08/06/21      PT LONG TERM GOAL #2   Title pt to report improved urinary leakage to no more than 3x per day    Time 4    Period Months    Status New    Target Date 08/06/21      PT LONG TERM GOAL #3   Title pt to report improved time between bladder voids to at least every 2 hours for decreased frequency and leakage    Time 4    Period Months    Status New    Target Date 08/06/21      PT LONG TERM GOAL #4   Title pt to demonstrate improved contract/relax of internal pelvic floor to 75% of the time    Time 4    Period Months    Status New    Target Date 08/06/21                   Plan - 05/01/21 1315     Clinical Impression Statement Pt reports she continues to have urge based incontinence and intermittent stress incontinence with laughing or coughing however reports she thinks when she is busy she has less leakage than when she is not busy as she has less intense urges when she is busy. Pt session focused on TA activation, abdominal massage with noted scar mobility deficits and reported she has continued to struggle with constipation which pt reported she felt was helpful, and exercises/stretching to help relax pelvic floor.  Pt would benefit from PT for  improved relaxation of pelvic floor, improved bil hip and core strength, hip and spine flexibility and decreased urinary leakage.    Personal Factors and Comorbidities Age;Time since onset of injury/illness/exacerbation    Examination-Activity Limitations  Locomotion Level;Continence;Lift;Stand;Stairs;Squat;Sit    Examination-Participation Restrictions Community Activity;Interpersonal Relationship    Stability/Clinical Decision Making Evolving/Moderate complexity    Clinical Decision Making Moderate    Rehab Potential Good    PT Frequency 1x / week    PT Treatment/Interventions ADLs/Self Care Home Management;Functional mobility training;Therapeutic activities;Therapeutic exercise;Neuromuscular re-education;Manual techniques;Patient/family education;Scar mobilization;Passive range of motion    PT Next Visit Plan relaxation, hip stretches, scar mob    PT Home Exercise Plan [id number]    Consulted and Agree with Plan of Care Patient             Patient will benefit from skilled therapeutic intervention in order to improve the following deficits and impairments:  Decreased coordination, Decreased range of motion, Increased fascial restricitons, Impaired tone, Decreased endurance, Pain, Decreased activity tolerance, Decreased mobility, Decreased scar mobility, Decreased strength, Postural dysfunction, Improper body mechanics, Impaired flexibility  Visit Diagnosis: Unspecified lack of coordination  Muscle weakness (generalized)  Other abnormalities of gait and mobility     Problem List Patient Active Problem List   Diagnosis Date Noted   Arthritis 02/12/2021   Prediabetes 02/12/2021   Mixed stress and urge urinary incontinence 02/12/2021   Obesity (BMI 30.0-34.9) 02/12/2021   Low ferritin 02/10/2021   Vitamin D insufficiency 02/10/2021   Vitamin B12 deficiency 02/10/2021   COVID-19 long hauler 10/13/2020   Neuropathy 08/22/2020   Chronic pain 02/10/2016   Paget disease of bone 12/28/2015   Esophageal reflux 08/15/2015   Hyperlipidemia 08/15/2015   Chronic pain in shoulder 08/15/2015   Anxiety and depression 11/25/2013   IBS (irritable bowel syndrome) 01/02/2013    Otelia Sergeant, PT 08/01/221:18 PM   [facility] [address], [address] [place], [place], [address] Phone: [phone]   Fax:  [phone]  Name: Julia [name] MRN: [id number] Date of Birth: [birth year]

## 2021-05-01 NOTE — Patient Instructions (Signed)
About Constipation  Constipation Overview Constipation is the most common gastrointestinal complaint -- about 4 million Americans experience constipation and make 2.5 million physician visits a year to get help for the problem.  Constipation can occur when the colon absorbs too much water, the colon's muscle contraction is slow or sluggish, and/or there is delayed transit time through the colon.  The result is stool that is hard and dry.  Indicators of constipation include straining during bowel movements greater than 25% of the time, having fewer than three bowel movements per week, and/or the feeling of incomplete evacuation.  There are established guidelines ([place] ) for defining constipation. A person needs to have two or more of the following symptoms for at least 12 weeks (not necessarily consecutive) in the preceding 12 months: Straining in  greater than 25% of bowel movements Lumpy or hard stools in greater than 25% of bowel movements Sensation of incomplete emptying in greater than 25% of bowel movements Sensation of anorectal obstruction/blockade in greater than 25% of bowel movements Manual maneuvers to help empty greater than 25% of bowel movements (e.g., digital evacuation, support of the pelvic floor)  Less than  3 bowel movements/week Loose stools are not present, and criteria for irritable bowel syndrome are insufficient Common Causes of Constipation lack of fiber in your diet lack of physical activity medications, including iron and calcium supplements  dairy intake dehydration abuse of laxatives travel Irritable Bowel Syndrome pregnancy luteal phase of menstruation (after ovulation and before menses) colorectal problems intestinal dysfunction  Treating Constipation  There are several ways of treating constipation, including changes to diet and exercise, use of laxatives, adjustments to the pelvic floor, and scheduled toileting.  These treatments include: increasing  fiber and fluids in the diet  increasing physical activity learning muscle coordination  learning proper toileting techniques and toileting modifications  designing and sticking  to a toileting schedule  A high fiber diet with plenty of fluids (up to 8 glasses of water daily) is suggested to relieve these symptoms.  Metamucil, 1 tablespoon once or twice daily can be used to keep bowels regular if needed.  Introduction to Bowel Health Diet and daily habits can help you predict when your bowels will move on a regular basis.  The consistency and quantity of the stool is usually more important than the frequency.  The goal is to have a regular bowel movement that is soft but formed.   Tips on Emptying Regularly Eat breakfast.  Usually the best time of day for a bowel movement will be a half hour to an hour after eating.  These times are best because the body uses the gastrocolic reflex, a stimulation of bowel motion that occurs with eating, to help produce a bowel movement.  For some people even a simple hot drink in the morning can help the reflex action begin. Eat all your meals at a predictable time each day.  The bowel functions best when food is introduced at the same regular intervals. The amount of food eaten at a given time of day should be about the same size from day to day.  The bowel functions best when food is introduced in similar quantities from day to day. It is fine to have a small breakfast and a large lunch, or vice versa, just be consistent. Eat two servings of fruit or vegetables and at least one serving of a complex carbohydrates (whole grains such as brown rice, bran, whole wheat bread, or oatmeal) at each  meal. Drink plenty of water--ideally eight glasses a day.  Be sure to increase your water intake if you are increasing fiber into your diet.  Maintain Healthy Habits Exercise daily.  You may exercise at any time of day, but you may find that bowel function is helped most if  the exercise is at a consistent time each day. Make sure that you are not rushed and have convenient access to a bathroom at your selected time to empty your bowels.

## 2021-05-08 ENCOUNTER — Ambulatory Visit (INDEPENDENT_AMBULATORY_CARE_PROVIDER_SITE_OTHER): Payer: Medicare HMO

## 2021-05-08 VITALS — Ht 61.0 in | Wt 160.0 lb

## 2021-05-08 DIAGNOSIS — Z0000 Encounter for general adult medical examination without abnormal findings: Secondary | ICD-10-CM | POA: Diagnosis not present

## 2021-05-08 DIAGNOSIS — Z1231 Encounter for screening mammogram for malignant neoplasm of breast: Secondary | ICD-10-CM | POA: Diagnosis not present

## 2021-05-08 NOTE — Progress Notes (Signed)
Subjective:   Julia [name] is a 60 y.o. female who presents for an Initial Medicare Annual Wellness Visit.  Virtual Visit via Telephone Note  I connected with  [name] on 05/08/21 at  9:00 AM EDT by telephone and verified that I am speaking with the correct person using two identifiers.  Location: Patient: Home Provider: [facility] Persons participating in the virtual visit: patient/Nurse Health Advisor   I discussed the limitations, risks, security and privacy concerns of performing an evaluation and management service by telephone and the availability of in person appointments. The patient expressed understanding and agreed to proceed.  Interactive audio and video telecommunications were attempted between this nurse and patient, however failed, due to patient having technical difficulties OR patient did not have access to video capability.  We continued and completed visit with audio only.  Some vital signs may be absent or patient reported.   Zakirah Weingart E Kathryn Linarez, LPN   Review of Systems     Cardiac Risk Factors include: dyslipidemia;obesity (BMI >30kg/m2);sedentary lifestyle     Objective:    Today's Vitals   05/08/21 0905  Weight: 160 lb (72.6 kg)  Height: 5\' 1"  (1.549 m)  PainSc: 6    Body mass index is 30.23 kg/m.  Advanced Directives 05/08/2021 04/05/2021 05/22/2020 05/20/2020 11/23/2018 12/22/2015 07/17/2013  Does Patient Have a Medical Advance Directive? No No - No No No Patient does not have advance directive;Patient would not like information  Would patient like information on creating a medical advance directive? No - Patient declined No - Patient declined No - Patient declined - No - Patient declined No - patient declined information -    Current Medications (verified) Outpatient Encounter Medications as of 05/08/2021  Medication Sig   atorvastatin (LIPITOR) 40 MG tablet Take 1 tablet (40 mg total) by mouth every evening.   buPROPion (WELLBUTRIN XL) 300 MG 24 hr  tablet Take 300 mg by mouth daily.    fluticasone furoate-vilanterol (BREO ELLIPTA) 100-25 MCG/INH AEPB Inhale 1 puff into the lungs daily.   gabapentin (NEURONTIN) 300 MG capsule Take 1 capsule (300 mg total) by mouth 2 (two) times daily. (Patient taking differently: Take 300 mg by mouth 2 (two) times daily. 1 capsule in the morning and 2 at night)   meloxicam (MOBIC) 15 MG tablet Take 1 tablet (15 mg total) by mouth daily.   mirabegron ER (MYRBETRIQ) 25 MG TB24 tablet Take 1 tablet (25 mg total) by mouth daily.   omeprazole (PRILOSEC) 40 MG capsule Take 1 capsule (40 mg total) by mouth daily.   Probiotic Product (PROBIOTIC DAILY PO) Take 1 tablet by mouth daily.   sertraline (ZOLOFT) 100 MG tablet Take 200 mg by mouth daily.    No facility-administered encounter medications on file as of 05/08/2021.    Allergies (verified) Lamictal [lamotrigine]   History: Past Medical History:  Diagnosis Date   Anxiety    Arthritis    Complication of anesthesia    slow to wake up-does not take much medicine to sedate her   Depression    Esophageal reflux    Hyperlipidemia    Low ferritin 02/10/2021   Paget disease of bone    Pneumonia due to COVID-19 virus 05/20/2020   Vitamin B12 deficiency 02/10/2021   Vitamin D insufficiency 02/10/2021   Wears glasses    Past Surgical History:  Procedure Laterality Date   ABDOMINAL HYSTERECTOMY  05/2010   TAH   ANTERIOR FUSION CERVICAL SPINE  10/2019  C3-C5   CERVICAL SPINE SURGERY  10/2011   CESAREAN SECTION     DILATION AND CURETTAGE OF UTERUS  1986   KNEE ARTHROSCOPY Left 07/22/2013   Procedure: LEFT KNEE ARTHROSCOPY WITH DEBRIDEMENT CHONDROPLASTY, REMOVAL LOOSE BODIES, PARTIAL MEDIAL MENISCECTOMY, OPEN EXCISION OF PES GANGLION;  Surgeon: [name], MD;  Location: [facility];  Service: Orthopedics;  Laterality: Left;  NO SPECIMEN SENT PER DR. [name] ORDER.   SPINE SURGERY  10/26/2019   TUBAL LIGATION  1989   Family History   Problem Relation Age of Onset   Thyroid disease Mother    Congestive Heart Failure Mother    Heart disease Mother    Lung cancer Sister    Melanoma Sister    Healthy Brother    Healthy Brother    Healthy Brother    Healthy Brother    Heart disease Father    Cancer Sister    Depression Sister    Social History   Socioeconomic History   Marital status: Significant Other    Spouse name: [name]   Number of children: 3   Years of education: Not on file   Highest education level: Not on file  Occupational History   Not on file  Tobacco Use   Smoking status: Former    Packs/day: 0.15    Years: 5.00    Pack years: 0.75    Types: Cigarettes    Quit date: 01/02/1985    Years since quitting: 36.3   Smokeless tobacco: Never   Tobacco comments:    1 pack per week per pt.   Vaping Use   Vaping Use: Never used  Substance and Sexual Activity   Alcohol use: Yes    Alcohol/week: 1.0 standard drink    Types: 1 Glasses of wine per week    Comment: occassional   Drug use: No   Sexual activity: Not Currently  Other Topics Concern   Not on file  Social History Narrative   2 sons live nearby, one son in [place]    Social Determinants of Health   Financial Resource Strain: Medium Risk   Difficulty of Paying Living Expenses: Somewhat hard  Food Insecurity: Geophysicist/field seismologist Present   Worried About Programme researcher, broadcasting/film/video in the Last Year: Sometimes true   Barista in the Last Year: Never true  Transportation Needs: No Transportation Needs   Lack of Transportation (Medical): No   Lack of Transportation (Non-Medical): No  Physical Activity: Insufficiently Active   Days of Exercise per Week: 3 days   Minutes of Exercise per Session: 30 min  Stress: Stress Concern Present   Feeling of Stress : To some extent  Social Connections: Moderately Integrated   Frequency of Communication with Friends and Family: More than three times a week   Frequency of Social Gatherings with  Friends and Family: Twice a week   Attends Religious Services: 1 to 4 times per year   Active Member of [employer] or Organizations: No   Attends Banker Meetings: Never   Marital Status: Living with partner    Tobacco Counseling Counseling given: Not Answered Tobacco comments: 1 pack per week per pt.    Clinical Intake:  Pre-visit preparation completed: Yes  Pain : 0-10 Pain Score: 6  Pain Type: Chronic pain Pain Location: Knee Pain Orientation: Right, Left Pain Descriptors / Indicators: Aching, Sore, Tender Pain Onset: More than a month ago Pain Frequency: Intermittent     BMI -  recorded: 30.23 Nutritional Status: BMI > 30  Obese Nutritional Risks: None Diabetes: No  How often do you need to have someone help you when you read instructions, pamphlets, or other written materials from your doctor or pharmacy?: 1 - Never  Diabetic? No  Interpreter Needed?: No  Information entered by :: Hevin Jeffcoat, LPN   Activities of Daily Living In your present state of health, do you have any difficulty performing the following activities: 05/08/2021 05/22/2020  Hearing? N -  Vision? N -  Difficulty concentrating or making decisions? Y -  Walking or climbing stairs? Y -  Comment arthritis in knees -  Dressing or bathing? N -  Doing errands, shopping? N N  Preparing Food and eating ? N -  Using the Toilet? N -  In the past six months, have you accidently leaked urine? Y -  Comment seeing Pelvic Floor PT and taking Myrbetriq -  Do you have problems with loss of bowel control? N -  Managing your Medications? N -  Managing your Finances? N -  Housekeeping or managing your Housekeeping? N -  Some recent data might be hidden    Patient Care Team: Gwenlyn FudgeJoyce, Britney F, FNP as PCP - General (Family Medicine) [facility] Diamantina ProvidencePowers, Alexander, MD as Referring Physician (Neurosurgery) Donnetta HailBeekman, James F, MD as Consulting Physician (Rheumatology) Nahser, Deloris PingPhilip  J, MD as Consulting Physician (Cardiology) Luciano CutterEllison, Chi Jane, MD as Consulting Physician (Pulmonary Disease) Michaelle CopasLe, Yen Thi Hong, MD as Referring Physician (Optometry)  Indicate any recent Medical Services you may have received from other than [facility] providers in the past year (date may be approximate).     Assessment:   This is a routine wellness examination for ElmerJanis.  Hearing/Vision screen Hearing Screening - Comments:: Denies hearing difficulties  Vision Screening - Comments:: Wears eyeglasses - up to date with annual eye exams with Dr Conley RollsLe in [place]  Dietary issues and exercise activities discussed: Current Exercise Habits: Structured exercise class;Home exercise routine, Type of exercise: walking;Other - see comments (pelvic floor PT), Time (Minutes): 30, Frequency (Times/Week): 6, Weekly Exercise (Minutes/Week): 180, Intensity: Mild, Exercise limited by: orthopedic condition(s)   Goals Addressed             This Visit's Progress    Exercise 3x per week (30 min per time)         Depression Screen PHQ 2/9 Scores 05/08/2021 02/12/2021 06/28/2020 05/11/2020 05/03/2020 12/23/2019 06/25/2019  PHQ - 2 Score 4 5 4  0 5 5 5   PHQ- 9 Score 13 20 15  - 21 18 21     Fall Risk Fall Risk  05/08/2021 06/28/2020 05/11/2020 05/03/2020 12/23/2019  Falls in the past year? 0 0 0 0 0  Number falls in past yr: 0 - - - -  Injury with Fall? 0 - - - -  Risk for fall due to : Orthopedic patient;Impaired vision;Medication side effect - - - -  Follow up Education provided;Falls prevention discussed - - - -    FALL RISK PREVENTION PERTAINING TO THE HOME:  Any stairs in or around the home? No  If so, are there any without handrails? No  Home free of loose throw rugs in walkways, pet beds, electrical cords, etc? Yes  Adequate lighting in your home to reduce risk of falls? Yes   ASSISTIVE DEVICES UTILIZED TO PREVENT FALLS:  Life alert? No  Use of a cane, walker or w/c? No  Grab bars in the bathroom? Yes  Shower  chair or  bench in shower? Yes  Elevated toilet seat or a handicapped toilet? Yes   TIMED UP AND GO:  Was the test performed? No . Telephonic visit  Cognitive Function: MMSE - Mini Mental State Exam 05/03/2020  Orientation to time 5  Orientation to Place 5  Registration 3  Attention/ Calculation 5  Recall 2  Language- name 2 objects 2  Language- repeat 1  Language- follow 3 step command 3  Language- read & follow direction 1  Write a sentence 1  Copy design 1  Total score 29     6CIT Screen 05/08/2021  What Year? 0 points  What month? 0 points  What time? 0 points  Count back from 20 0 points  Months in reverse 0 points  Repeat phrase 4 points  Total Score 4    Immunizations Immunization History  Administered Date(s) Administered   Influenza Split 06/22/2015, 07/03/2016   Influenza,inj,Quad PF,6+ Mos 06/25/2019, 06/28/2020   Influenza,trivalent, recombinat, inj, PF 07/16/2017   Tdap 11/11/2013   Zoster Recombinat (Shingrix) 06/25/2019, 12/11/2019    TDAP status: Up to date  Flu Vaccine status: Up to date  Pneumococcal vaccine status: Due, Education has been provided regarding the importance of this vaccine. Advised may receive this vaccine at local pharmacy or Health Dept. Aware to provide a copy of the vaccination record if obtained from local pharmacy or Health Dept. Verbalized acceptance and understanding.  Covid-19 vaccine status: Declined, Education has been provided regarding the importance of this vaccine but patient still declined. Advised may receive this vaccine at local pharmacy or Health Dept.or vaccine clinic. Aware to provide a copy of the vaccination record if obtained from local pharmacy or Health Dept. Verbalized acceptance and understanding.  Qualifies for Shingles Vaccine? Yes   Zostavax completed No   Shingrix Completed?: Yes  Screening Tests Health Maintenance  Topic Date Due   COVID-19 Vaccine (1) Never done   Pneumococcal Vaccine 0-64 Years  old (1 - PCV) Never done   MAMMOGRAM  03/11/2021   INFLUENZA VACCINE  05/01/2021   TETANUS/TDAP  11/12/2023   DEXA SCAN  12/22/2024   COLONOSCOPY (Pts 45-61yrs Insurance coverage will need to be confirmed)  09/08/2029   Hepatitis C Screening  Completed   HIV Screening  Completed   Zoster Vaccines- Shingrix  Completed   HPV VACCINES  Aged Out    Health Maintenance  Health Maintenance Due  Topic Date Due   COVID-19 Vaccine (1) Never done   Pneumococcal Vaccine 17-66 Years old (1 - PCV) Never done   MAMMOGRAM  03/11/2021   INFLUENZA VACCINE  05/01/2021    Colorectal cancer screening: Type of screening: Colonoscopy. Completed 09/09/2019. Repeat every 10 years  Mammogram status: Ordered 05/05/21. Pt provided with contact info and advised to call to schedule appt.   Bone Density status: Completed 12/23/2019. Results reflect: Bone density results: NORMAL. Repeat every 5 years.  Lung Cancer Screening: (Low Dose CT Chest recommended if Age 15-80 years, 30 pack-year currently smoking OR have quit w/in 15years.) does not qualify.   Additional Screening:  Hepatitis C Screening: does qualify; Completed 06/15/2019  Vision Screening: Recommended annual ophthalmology exams for early detection of glaucoma and other disorders of the eye. Is the patient up to date with their annual eye exam?  Yes  Who is the provider or what is the name of the office in which the patient attends annual eye exams? [facility] If pt is not established with a provider, would they like to be  referred to a provider to establish care? No .   Dental Screening: Recommended annual dental exams for proper oral hygiene  Community Resource Referral / Chronic Care Management: CRR required this visit?  No   CCM required this visit?  No      Plan:     I have personally reviewed and noted the following in the patient's chart:   Medical and social history Use of alcohol, tobacco or illicit drugs  Current medications and  supplements including opioid prescriptions. Patient is not currently taking opioid prescriptions. Functional ability and status Nutritional status Physical activity Advanced directives List of other physicians Hospitalizations, surgeries, and ER visits in previous 12 months Vitals Screenings to include cognitive, depression, and falls Referrals and appointments  In addition, I have reviewed and discussed with patient certain preventive protocols, quality metrics, and best practice recommendations. A written personalized care plan for preventive services as well as general preventive health recommendations were provided to patient.     [place] Constable, LPN   0/11/5463   Nurse Notes: None

## 2021-05-08 NOTE — Patient Instructions (Signed)
Julia [name] , Thank you for taking time to come for your Medicare Wellness Visit. I appreciate your ongoing commitment to your health goals. Please review the following plan we discussed and let me know if I can assist you in the future.   Screening recommendations/referrals: Colonoscopy: Done 09/09/2019 - Repeat in 10 years Mammogram: Done 03/12/2019 - Repeat annually *ordered today Bone Density: Done 12/23/2019 - .Repeat in 5 years  Recommended yearly ophthalmology/optometry visit for glaucoma screening and checkup Recommended yearly dental visit for hygiene and checkup  Vaccinations: Influenza vaccine: Done 06/28/2020 - Repeat annually Pneumococcal vaccine: Due *2 doses one year apart Tdap vaccine: Done 11/11/2013 - Repeat in 10 years Shingles vaccine: Done 06/25/2019 & 12/11/2019  Covid-19: Declined  Advanced directives: Advance directive discussed with you today. Even though you declined this today, please call our office should you change your mind, and we can give you the proper paperwork for you to fill out.   Conditions/risks identified: Aim for 30 minutes of exercise or brisk walking each day, drink 6-8 glasses of water and eat lots of fruits and vegetables.   Next appointment: Follow up in one year for your annual wellness visit.   Preventive Care 40-64 Years, Female Preventive care refers to lifestyle choices and visits with your health care provider that can promote health and wellness. What does preventive care include? A yearly physical exam. This is also called an annual well check. Dental exams once or twice a year. Routine eye exams. Ask your health care provider how often you should have your eyes checked. Personal lifestyle choices, including: Daily care of your teeth and gums. Regular physical activity. Eating a healthy diet. Avoiding tobacco and drug use. Limiting alcohol use. Practicing safe sex. Taking low-dose aspirin daily starting at age 46. Taking vitamin  and mineral supplements as recommended by your health care provider. What happens during an annual well check? The services and screenings done by your health care provider during your annual well check will depend on your age, overall health, lifestyle risk factors, and family history of disease. Counseling  Your health care provider may ask you questions about your: Alcohol use. Tobacco use. Drug use. Emotional well-being. Home and relationship well-being. Sexual activity. Eating habits. Work and work Statistician. Method of birth control. Menstrual cycle. Pregnancy history. Screening  You may have the following tests or measurements: Height, weight, and BMI. Blood pressure. Lipid and cholesterol levels. These may be checked every 5 years, or more frequently if you are over 75 years old. Skin check. Lung cancer screening. You may have this screening every year starting at age 73 if you have a 30-pack-year history of smoking and currently smoke or have quit within the past 15 years. Fecal occult blood test (FOBT) of the stool. You may have this test every year starting at age 21. Flexible sigmoidoscopy or colonoscopy. You may have a sigmoidoscopy every 5 years or a colonoscopy every 10 years starting at age 69. Hepatitis C blood test. Hepatitis B blood test. Sexually transmitted disease (STD) testing. Diabetes screening. This is done by checking your blood sugar (glucose) after you have not eaten for a while (fasting). You may have this done every 1-3 years. Mammogram. This may be done every 1-2 years. Talk to your health care provider about when you should start having regular mammograms. This may depend on whether you have a family history of breast cancer. BRCA-related cancer screening. This may be done if you have a family history of breast,  ovarian, tubal, or peritoneal cancers. Pelvic exam and Pap test. This may be done every 3 years starting at age 55. Starting at age 54, this  may be done every 5 years if you have a Pap test in combination with an HPV test. Bone density scan. This is done to screen for osteoporosis. You may have this scan if you are at high risk for osteoporosis. Discuss your test results, treatment options, and if necessary, the need for more tests with your health care provider. Vaccines  Your health care provider may recommend certain vaccines, such as: Influenza vaccine. This is recommended every year. Tetanus, diphtheria, and acellular pertussis (Tdap, Td) vaccine. You may need a Td booster every 10 years. Zoster vaccine. You may need this after age 66. Pneumococcal 13-valent conjugate (PCV13) vaccine. You may need this if you have certain conditions and were not previously vaccinated. Pneumococcal polysaccharide (PPSV23) vaccine. You may need one or two doses if you smoke cigarettes or if you have certain conditions. Talk to your health care provider about which screenings and vaccines you need and how often you need them. This information is not intended to replace advice given to you by your health care provider. Make sure you discuss any questions you have with your health care provider. Document Released: 10/14/2015 Document Revised: 06/06/2016 Document Reviewed: 07/19/2015 Elsevier Interactive Patient Education  2017 [employer] Prevention in the Home Falls can cause injuries. They can happen to people of all ages. There are many things you can do to make your home safe and to help prevent falls. What can I do on the outside of my home? Regularly fix the edges of walkways and driveways and fix any cracks. Remove anything that might make you trip as you walk through a door, such as a raised step or threshold. Trim any bushes or trees on the path to your home. Use bright outdoor lighting. Clear any walking paths of anything that might make someone trip, such as rocks or tools. Regularly check to see if handrails are loose or  broken. Make sure that both sides of any steps have handrails. Any raised decks and porches should have guardrails on the edges. Have any leaves, snow, or ice cleared regularly. Use sand or salt on walking paths during winter. Clean up any spills in your garage right away. This includes oil or grease spills. What can I do in the bathroom? Use night lights. Install grab bars by the toilet and in the tub and shower. Do not use towel bars as grab bars. Use non-skid mats or decals in the tub or shower. If you need to sit down in the shower, use a plastic, non-slip stool. Keep the floor dry. Clean up any water that spills on the floor as soon as it happens. Remove soap buildup in the tub or shower regularly. Attach bath mats securely with double-sided non-slip rug tape. Do not have throw rugs and other things on the floor that can make you trip. What can I do in the bedroom? Use night lights. Make sure that you have a light by your bed that is easy to reach. Do not use any sheets or blankets that are too big for your bed. They should not hang down onto the floor. Have a firm chair that has side arms. You can use this for support while you get dressed. Do not have throw rugs and other things on the floor that can make you trip. What can  I do in the kitchen? Clean up any spills right away. Avoid walking on wet floors. Keep items that you use a lot in easy-to-reach places. If you need to reach something above you, use a strong step stool that has a grab bar. Keep electrical cords out of the way. Do not use floor polish or wax that makes floors slippery. If you must use wax, use non-skid floor wax. Do not have throw rugs and other things on the floor that can make you trip. What can I do with my stairs? Do not leave any items on the stairs. Make sure that there are handrails on both sides of the stairs and use them. Fix handrails that are broken or loose. Make sure that handrails are as long as  the stairways. Check any carpeting to make sure that it is firmly attached to the stairs. Fix any carpet that is loose or worn. Avoid having throw rugs at the top or bottom of the stairs. If you do have throw rugs, attach them to the floor with carpet tape. Make sure that you have a light switch at the top of the stairs and the bottom of the stairs. If you do not have them, ask someone to add them for you. What else can I do to help prevent falls? Wear shoes that: Do not have high heels. Have rubber bottoms. Are comfortable and fit you well. Are closed at the toe. Do not wear sandals. If you use a stepladder: Make sure that it is fully opened. Do not climb a closed stepladder. Make sure that both sides of the stepladder are locked into place. Ask someone to hold it for you, if possible. Clearly mark and make sure that you can see: Any grab bars or handrails. First and last steps. Where the edge of each step is. Use tools that help you move around (mobility aids) if they are needed. These include: Canes. Walkers. Scooters. Crutches. Turn on the lights when you go into a dark area. Replace any light bulbs as soon as they burn out. Set up your furniture so you have a clear path. Avoid moving your furniture around. If any of your floors are uneven, fix them. If there are any pets around you, be aware of where they are. Review your medicines with your doctor. Some medicines can make you feel dizzy. This can increase your chance of falling. Ask your doctor what other things that you can do to help prevent falls. This information is not intended to replace advice given to you by your health care provider. Make sure you discuss any questions you have with your health care provider. Document Released: 07/14/2009 Document Revised: 02/23/2016 Document Reviewed: 10/22/2014 Elsevier Interactive Patient Education  2017 [employer].

## 2021-05-15 ENCOUNTER — Ambulatory Visit: Payer: Medicare HMO | Admitting: Physical Therapy

## 2021-05-30 DIAGNOSIS — R69 Illness, unspecified: Secondary | ICD-10-CM | POA: Diagnosis not present

## 2021-05-31 ENCOUNTER — Encounter: Payer: Medicare HMO | Admitting: Physical Therapy

## 2021-06-06 ENCOUNTER — Other Ambulatory Visit: Payer: Self-pay | Admitting: Nurse Practitioner

## 2021-06-06 DIAGNOSIS — Z1231 Encounter for screening mammogram for malignant neoplasm of breast: Secondary | ICD-10-CM

## 2021-06-14 ENCOUNTER — Encounter: Payer: Medicare HMO | Admitting: Physical Therapy

## 2021-06-22 ENCOUNTER — Encounter: Payer: Self-pay | Admitting: Family Medicine

## 2021-06-22 ENCOUNTER — Ambulatory Visit (INDEPENDENT_AMBULATORY_CARE_PROVIDER_SITE_OTHER): Payer: Medicare HMO | Admitting: Family Medicine

## 2021-06-22 ENCOUNTER — Other Ambulatory Visit: Payer: Self-pay

## 2021-06-22 VITALS — BP 124/80 | HR 59 | Temp 97.5°F | Ht 61.0 in | Wt 157.0 lb

## 2021-06-22 DIAGNOSIS — F419 Anxiety disorder, unspecified: Secondary | ICD-10-CM | POA: Diagnosis not present

## 2021-06-22 DIAGNOSIS — M25562 Pain in left knee: Secondary | ICD-10-CM

## 2021-06-22 DIAGNOSIS — F32A Depression, unspecified: Secondary | ICD-10-CM

## 2021-06-22 DIAGNOSIS — R200 Anesthesia of skin: Secondary | ICD-10-CM

## 2021-06-22 DIAGNOSIS — M25511 Pain in right shoulder: Secondary | ICD-10-CM

## 2021-06-22 DIAGNOSIS — M1990 Unspecified osteoarthritis, unspecified site: Secondary | ICD-10-CM | POA: Diagnosis not present

## 2021-06-22 DIAGNOSIS — G8929 Other chronic pain: Secondary | ICD-10-CM

## 2021-06-22 DIAGNOSIS — R790 Abnormal level of blood mineral: Secondary | ICD-10-CM | POA: Diagnosis not present

## 2021-06-22 DIAGNOSIS — E782 Mixed hyperlipidemia: Secondary | ICD-10-CM | POA: Diagnosis not present

## 2021-06-22 DIAGNOSIS — N3946 Mixed incontinence: Secondary | ICD-10-CM | POA: Diagnosis not present

## 2021-06-22 DIAGNOSIS — E559 Vitamin D deficiency, unspecified: Secondary | ICD-10-CM

## 2021-06-22 DIAGNOSIS — R7303 Prediabetes: Secondary | ICD-10-CM | POA: Diagnosis not present

## 2021-06-22 DIAGNOSIS — E538 Deficiency of other specified B group vitamins: Secondary | ICD-10-CM

## 2021-06-22 DIAGNOSIS — Z23 Encounter for immunization: Secondary | ICD-10-CM | POA: Diagnosis not present

## 2021-06-22 DIAGNOSIS — R69 Illness, unspecified: Secondary | ICD-10-CM | POA: Diagnosis not present

## 2021-06-22 DIAGNOSIS — K219 Gastro-esophageal reflux disease without esophagitis: Secondary | ICD-10-CM

## 2021-06-22 DIAGNOSIS — M25512 Pain in left shoulder: Secondary | ICD-10-CM

## 2021-06-22 LAB — BAYER DCA HB A1C WAIVED: HB A1C (BAYER DCA - WAIVED): 5.4 % (ref 4.8–5.6)

## 2021-06-22 MED ORDER — ATORVASTATIN CALCIUM 40 MG PO TABS: 40.0000 mg | ORAL_TABLET | Freq: Every evening | ORAL | 1 refills | Status: DC

## 2021-06-22 MED ORDER — METHOCARBAMOL 500 MG PO TABS: 500.0000 mg | ORAL_TABLET | Freq: Three times a day (TID) | ORAL | 1 refills | Status: DC | PRN

## 2021-06-22 MED ORDER — OXYBUTYNIN CHLORIDE ER 5 MG PO TB24: 5.0000 mg | ORAL_TABLET | Freq: Every day | ORAL | 2 refills | Status: DC

## 2021-06-22 NOTE — Progress Notes (Signed)
Assessment & Plan:  1. Mixed hyperlipidemia - Well controlled on current regimen - encouraged healthy diet and exercise - atorvastatin (LIPITOR) 40 MG tablet; Take 1 tablet (40 mg total) by mouth every evening.  Dispense: 90 tablet; Refill: 1 - Lipid panel - CMP14+EGFR  2. Prediabetes - encouraged healthy diet and exercise - Bayer DCA Hb A1c Waived  3. Gastroesophageal reflux disease, unspecified whether esophagitis present -Well controlled on current regimen - continue omeprazole  - CMP14+EGFR  4. Anxiety and depression - managed by her psychiatrist, continue Zoloft and Wellbutrin - CMP14+EGFR  5. Arthritis - continue meloxicam for pain - CMP14+EGFR  6. Mixed stress and urge urinary incontinence - unable to afford Myrbetriq, will switch to Ditropan - continue pelvic health exercises - CMP14+EGFR - oxybutynin (DITROPAN XL) 5 MG 24 hr tablet; Take 1 tablet (5 mg total) by mouth at bedtime.  Dispense: 30 tablet; Refill: 2  7. Low ferritin - on iron supplement - Anemia Profile B  8. Vitamin D insufficiency - on vitamin D supplement - VITAMIN D 25 Hydroxy (Vit-D Deficiency, Fractures)  9. Vitamin B12 deficiency - on B12 supplement - Anemia Profile B  10. Need for immunization against influenza - influenza vaccine today  11. Chronic pain of both shoulders - added muscle relaxer  - Ambulatory referral to Orthopedic Surgery  12. Chronic pain of left knee - Ambulatory referral to Orthopedic Surgery - methocarbamol (ROBAXIN) 500 MG tablet; Take 1 tablet (500 mg total) by mouth every 8 (eight) hours as needed for muscle spasms.  Dispense: 60 tablet; Refill: 1  13. Bilateral hand numbness - encouraged wrist splint at night - education provided on carpal tunnel - Ambulatory referral to Orthopedic Surgery   Return in about 6 months (around 12/20/2021) for annual physical & 6 weeks for UI.  Lucile Crater, NP Student  I personally was present during the history,  physical exam, and medical decision-making activities of this service and have verified that the service and findings are accurately documented in the nurse practitioner student's note.  Hendricks Limes, MSN, APRN, FNP-C [facility]   Subjective:    Patient ID: Julia [name], female    DOB: Dec 28, 1960, 59 y.o.   MRN: [id number]  Patient Care Team: Loman Brooklyn, FNP as PCP - General (Family Medicine) Fransico Michael (Psychiatry) Atilano Ina, MD as Referring Physician (Neurosurgery) Hennie Duos, MD as Consulting Physician (Rheumatology) Nahser, Wonda Cheng, MD as Consulting Physician (Cardiology) Margaretha Seeds, MD as Consulting Physician (Pulmonary Disease) Harlen Labs, MD as Referring Physician (Optometry)   Chief Complaint:  Chief Complaint  Patient presents with   Prediabetes   Hyperlipidemia    3 month follow up of chronic medical conditions    Knee Pain    Patient states she has been having left knee pain that has been ongoing and has not gotten any better after her knee surgery in 2014     HPI: Julia [name] is a 60 y.o. female presenting on 06/22/2021 for Prediabetes, Hyperlipidemia (3 month follow up of chronic medical conditions ), and Knee Pain (Patient states she has been having left knee pain that has been ongoing and has not gotten any better after her knee surgery in 2014/)  Hyperlipidemia: patient is tolerating Atorvastatin. The 10-year ASCVD risk score (Arnett DK, et al., 2019) is: 2.3%   Values used to calculate the score:     Age: 67 years     Sex: Female  Is Non-Hispanic African American: No     Diabetic: No     Tobacco smoker: No     Systolic Blood Pressure: 109 mmHg     Is BP treated: No     HDL Cholesterol: 74 mg/dL     Total Cholesterol: 206 mg/dL  Anxiety/Depression are managed by [facility]. Patient reports she was having trouble sleeping so Dr. Eulas Post increased Gabapentin at night from 300 mg to 600 mg  and stopped her Trazodone. She states she is experiencing a lot of life changes right now with her husband not working and a new grandchild which is contributing to her increase in symptoms.   Depression screen [facility] 2/9 06/22/2021 05/08/2021 02/12/2021  Decreased Interest 2 2 3   Down, Depressed, Hopeless 2 2 2   PHQ - 2 Score 4 4 5   Altered sleeping 3 2 3   Tired, decreased energy 3 2 3   Change in appetite 2 2 2   Feeling bad or failure about yourself  2 1 1   Trouble concentrating 2 2 3   Moving slowly or fidgety/restless 1 0 2  Suicidal thoughts 0 0 1  PHQ-9 Score 17 13 20   Difficult doing work/chores Somewhat difficult Somewhat difficult Very difficult  Some recent data might be hidden   GAD 7 : Generalized Anxiety Score 06/22/2021 02/12/2021 06/28/2020 05/03/2020  Nervous, Anxious, on Edge 3 2 2 3   Control/stop worrying 3 2 2  0  Worry too much - different things 3 3 2 3   Trouble relaxing 2 2 1 3   Restless 1 1 1 2   Easily annoyed or irritable 1 2 2 2   Afraid - awful might happen 2 2 2 1   Total GAD 7 Score 15 14 12 14   Anxiety Difficulty Very difficult Very difficult - Somewhat difficult   Urge Incontinence: She was previously referred to pelvic floor physical therapy and only went to 3 sessions. She was placed on Myrbetriq but was unable to take due to cost of copay.   New complaints: She states she was previously referred to orthopedics for evaluation of carpal tunnel, left knee pain, and shoulder pain, but did not go because she got COVID around the time of her appointment. She had left knee surgery several years ago and she has had ongoing knee pain since that keeps her up at night and sometimes impedes walking. She has fallen 3 times. She takes Meloxicam and uses aleve roll on gel on the site. She has not had a muscle relaxer. Her shoulder pain is intermittent but affects both shoulder blades. She states the pain is sharp and occurs at least once per day. She states her arms sometimes go numb.  She suspects she has carpal tunnel due to numbness in her hands. She is requesting a new referral since it has been over a year.    Social history:  Relevant past medical, surgical, family and social history reviewed and updated as indicated. Interim medical history since our last visit reviewed.  Allergies and medications reviewed and updated.  DATA REVIEWED: CHART IN EPIC  ROS: Negative unless specifically indicated above in HPI.    Current Outpatient Medications:    buPROPion (WELLBUTRIN XL) 300 MG 24 hr tablet, Take 300 mg by mouth daily. , Disp: , Rfl:    Cholecalciferol (VITAMIN D3) 50 MCG (2000 UT) capsule, Take 2,000 Units by mouth daily., Disp: , Rfl:    ferrous sulfate 325 (65 FE) MG tablet, Take 325 mg by mouth daily., Disp: , Rfl:  gabapentin (NEURONTIN) 300 MG capsule, Take 1 capsule (300 mg total) by mouth 2 (two) times daily. (Patient taking differently: Take 300 mg by mouth 2 (two) times daily. 1 capsule in the morning and 2 at night), Disp: 180 capsule, Rfl: 1   meloxicam (MOBIC) 15 MG tablet, Take 1 tablet (15 mg total) by mouth daily., Disp: 90 tablet, Rfl: 1   methocarbamol (ROBAXIN) 500 MG tablet, Take 1 tablet (500 mg total) by mouth every 8 (eight) hours as needed for muscle spasms., Disp: 60 tablet, Rfl: 1   omeprazole (PRILOSEC) 40 MG capsule, Take 1 capsule (40 mg total) by mouth daily., Disp: 90 capsule, Rfl: 1   oxybutynin (DITROPAN XL) 5 MG 24 hr tablet, Take 1 tablet (5 mg total) by mouth at bedtime., Disp: 30 tablet, Rfl: 2   Probiotic Product (PROBIOTIC DAILY PO), Take 1 tablet by mouth daily., Disp: , Rfl:    sertraline (ZOLOFT) 100 MG tablet, Take 200 mg by mouth daily. , Disp: , Rfl:    vitamin B-12 (CYANOCOBALAMIN) 1000 MCG tablet, Take 1,000 mcg by mouth daily., Disp: , Rfl:    atorvastatin (LIPITOR) 40 MG tablet, Take 1 tablet (40 mg total) by mouth every evening., Disp: 90 tablet, Rfl: 1   Allergies  Allergen Reactions   Lamictal [Lamotrigine]  Itching and Swelling   Past Medical History:  Diagnosis Date   Anxiety    Arthritis    Complication of anesthesia    slow to wake up-does not take much medicine to sedate her   Depression    Esophageal reflux    Hyperlipidemia    Low ferritin 02/10/2021   Paget disease of bone    Pneumonia due to COVID-19 virus 05/20/2020   Vitamin B12 deficiency 02/10/2021   Vitamin D insufficiency 02/10/2021   Wears glasses     Past Surgical History:  Procedure Laterality Date   ABDOMINAL HYSTERECTOMY  05/2010   TAH   ANTERIOR FUSION CERVICAL SPINE  10/2019   C3-C5   CERVICAL SPINE SURGERY  10/2011   CESAREAN SECTION     DILATION AND CURETTAGE OF UTERUS  1986   KNEE ARTHROSCOPY Left 07/22/2013   Procedure: LEFT KNEE ARTHROSCOPY WITH DEBRIDEMENT CHONDROPLASTY, REMOVAL LOOSE BODIES, PARTIAL MEDIAL MENISCECTOMY, OPEN EXCISION OF PES GANGLION;  Surgeon: Kerin Salen, MD;  Location: [place];  Service: Orthopedics;  Laterality: Left;  NO SPECIMEN SENT PER DR. Mayer Camel ORDER.   SPINE SURGERY  10/26/2019   TUBAL LIGATION  1989    Social History   Socioeconomic History   Marital status: Significant Other    Spouse name: Iona Hansen   Number of children: 3   Years of education: Not on file   Highest education level: Not on file  Occupational History   Not on file  Tobacco Use   Smoking status: Former    Packs/day: 0.15    Years: 5.00    Pack years: 0.75    Types: Cigarettes    Quit date: 01/02/1985    Years since quitting: 36.4   Smokeless tobacco: Never   Tobacco comments:    1 pack per week per pt.   Vaping Use   Vaping Use: Never used  Substance and Sexual Activity   Alcohol use: Yes    Alcohol/week: 1.0 standard drink    Types: 1 Glasses of wine per week    Comment: occassional   Drug use: No   Sexual activity: Not Currently  Other Topics Concern  Not on file  Social History Narrative   2 sons live nearby, one son in [place] Resource Strain: Medium Risk   Difficulty of Paying Living Expenses: Somewhat hard  Food Insecurity: Landscape architect Present   Worried About Charity fundraiser in the Last Year: Sometimes true   Arboriculturist in the Last Year: Never true  Transportation Needs: No Transportation Needs   Lack of Transportation (Medical): No   Lack of Transportation (Non-Medical): No  Physical Activity: Insufficiently Active   Days of Exercise per Week: 3 days   Minutes of Exercise per Session: 30 min  Stress: Stress Concern Present   Feeling of Stress : To some extent  Social Connections: Moderately Integrated   Frequency of Communication with Friends and Family: More than three times a week   Frequency of Social Gatherings with Friends and Family: Twice a week   Attends Religious Services: 1 to 4 times per year   Active Member of [employer] or Organizations: No   Attends Archivist Meetings: Never   Marital Status: Living with partner  Intimate Partner Violence: Not At Risk   Fear of Current or Ex-Partner: No   Emotionally Abused: No   Physically Abused: No   Sexually Abused: No        Objective:    BP 124/80   Pulse (!) 59   Temp (!) 97.5 F (36.4 C) (Temporal)   Ht 5' 1"  (1.549 m)   Wt 71.2 kg   LMP 01/02/2010   SpO2 96%   BMI 29.66 kg/m   Wt Readings from Last 3 Encounters:  06/22/21 157 lb (71.2 kg)  05/08/21 160 lb (72.6 kg)  02/08/21 167 lb 6.4 oz (75.9 kg)    Physical Exam Vitals reviewed.  Constitutional:      General: She is not in acute distress.    Appearance: Normal appearance. She is overweight. She is not ill-appearing, toxic-appearing or diaphoretic.  HENT:     Head: Normocephalic and atraumatic.  Eyes:     General: No scleral icterus.       Right eye: No discharge.        Left eye: No discharge.     Conjunctiva/sclera: Conjunctivae normal.  Cardiovascular:     Rate and Rhythm: Normal rate and regular rhythm.     Heart sounds:  Normal heart sounds. No murmur heard.   No friction rub. No gallop.  Pulmonary:     Effort: Pulmonary effort is normal. No respiratory distress.     Breath sounds: Normal breath sounds. No stridor. No wheezing, rhonchi or rales.  Musculoskeletal:        General: Normal range of motion.     Right shoulder: Tenderness (muscular) present.     Left shoulder: Tenderness (muscular) present.     Cervical back: Normal range of motion.     Comments: Negative Tinel and Phalen's signs.  Skin:    General: Skin is warm and dry.     Capillary Refill: Capillary refill takes less than 2 seconds.  Neurological:     General: No focal deficit present.     Mental Status: She is alert and oriented to person, place, and time. Mental status is at baseline.  Psychiatric:        Mood and Affect: Mood normal.        Behavior: Behavior normal.        Thought Content: Thought content  normal.        Judgment: Judgment normal.    Lab Results  Component Value Date   TSH 2.100 02/08/2021   Lab Results  Component Value Date   WBC 4.9 02/08/2021   HGB 11.8 02/08/2021   HCT 37.2 02/08/2021   MCV 89 02/08/2021   PLT 244 02/08/2021   Lab Results  Component Value Date   NA 144 02/08/2021   K 4.7 02/08/2021   CO2 22 02/08/2021   GLUCOSE 98 02/08/2021   BUN 15 02/08/2021   CREATININE 0.91 02/08/2021   BILITOT 0.3 02/08/2021   ALKPHOS 86 02/08/2021   AST 18 02/08/2021   ALT 12 02/08/2021   PROT 7.0 02/08/2021   ALBUMIN 4.4 02/08/2021   CALCIUM 9.4 02/08/2021   ANIONGAP 11 05/29/2020   EGFR 73 02/08/2021   Lab Results  Component Value Date   CHOL 206 (H) 02/08/2021   Lab Results  Component Value Date   HDL 74 02/08/2021   Lab Results  Component Value Date   LDLCALC 118 (H) 02/08/2021   Lab Results  Component Value Date   TRIG 76 02/08/2021   Lab Results  Component Value Date   CHOLHDL 2.8 02/08/2021   Lab Results  Component Value Date   HGBA1C 5.7 02/08/2021

## 2021-06-23 LAB — ANEMIA PROFILE B
Basophils Absolute: 0 10*3/uL (ref 0.0–0.2)
Basos: 1 %
EOS (ABSOLUTE): 0.1 10*3/uL (ref 0.0–0.4)
Eos: 2 %
Ferritin: 33 ng/mL (ref 15–150)
Folate: 7.9 ng/mL (ref >3.0)
Hematocrit: 40.5 % (ref 34.0–46.6)
Hemoglobin: 13.7 g/dL (ref 11.1–15.9)
Immature Grans (Abs): 0 10*3/uL (ref 0.0–0.1)
Immature Granulocytes: 0 %
Iron Saturation: 35 % (ref 15–55)
Iron: 98 ug/dL (ref 27–159)
Lymphocytes Absolute: 1.1 10*3/uL (ref 0.7–3.1)
Lymphs: 20 %
MCH: 30.9 pg (ref 26.6–33.0)
MCHC: 33.8 g/dL (ref 31.5–35.7)
MCV: 91 fL (ref 79–97)
Monocytes Absolute: 0.4 10*3/uL (ref 0.1–0.9)
Monocytes: 7 %
Neutrophils Absolute: 3.8 10*3/uL (ref 1.4–7.0)
Neutrophils: 70 %
Platelets: 200 10*3/uL (ref 150–450)
RBC: 4.43 x10E6/uL (ref 3.77–5.28)
RDW: 13.7 % (ref 11.7–15.4)
Retic Ct Pct: 1.5 % (ref 0.6–2.6)
Total Iron Binding Capacity: 277 ug/dL (ref 250–450)
UIBC: 179 ug/dL (ref 131–425)
Vitamin B-12: 1441 pg/mL — ABNORMAL HIGH (ref 232–1245)
WBC: 5.4 10*3/uL (ref 3.4–10.8)

## 2021-06-23 LAB — LIPID PANEL
Chol/HDL Ratio: 2.8 ratio (ref 0.0–4.4)
Cholesterol, Total: 203 mg/dL — ABNORMAL HIGH (ref 100–199)
HDL: 73 mg/dL (ref >39)
LDL Chol Calc (NIH): 114 mg/dL — ABNORMAL HIGH (ref 0–99)
Triglycerides: 89 mg/dL (ref 0–149)
VLDL Cholesterol Cal: 16 mg/dL (ref 5–40)

## 2021-06-23 LAB — CMP14+EGFR
ALT: 11 IU/L (ref 0–32)
AST: 18 IU/L (ref 0–40)
Albumin/Globulin Ratio: 2.4 — ABNORMAL HIGH (ref 1.2–2.2)
Albumin: 4.7 g/dL (ref 3.8–4.9)
Alkaline Phosphatase: 84 IU/L (ref 44–121)
BUN/Creatinine Ratio: 17 (ref 9–23)
BUN: 17 mg/dL (ref 6–24)
Bilirubin Total: 0.4 mg/dL (ref 0.0–1.2)
CO2: 22 mmol/L (ref 20–29)
Calcium: 9.8 mg/dL (ref 8.7–10.2)
Chloride: 104 mmol/L (ref 96–106)
Creatinine, Ser: 1.01 mg/dL — ABNORMAL HIGH (ref 0.57–1.00)
Globulin, Total: 2 g/dL (ref 1.5–4.5)
Glucose: 96 mg/dL (ref 65–99)
Potassium: 4.7 mmol/L (ref 3.5–5.2)
Sodium: 141 mmol/L (ref 134–144)
Total Protein: 6.7 g/dL (ref 6.0–8.5)
eGFR: 64 mL/min/{1.73_m2} (ref >59)

## 2021-06-23 LAB — VITAMIN D 25 HYDROXY (VIT D DEFICIENCY, FRACTURES): Vit D, 25-Hydroxy: 42.6 ng/mL (ref 30.0–100.0)

## 2021-06-28 ENCOUNTER — Other Ambulatory Visit: Payer: Self-pay

## 2021-06-28 ENCOUNTER — Ambulatory Visit
Admission: RE | Admit: 2021-06-28 | Discharge: 2021-06-28 | Disposition: A | Discharge status: Home / Self Care | Payer: Medicare HMO | Source: Ambulatory Visit | Attending: Family Medicine | Admitting: Family Medicine

## 2021-06-28 ENCOUNTER — Encounter: Payer: Medicare HMO | Admitting: Physical Therapy

## 2021-06-28 DIAGNOSIS — Z1231 Encounter for screening mammogram for malignant neoplasm of breast: Secondary | ICD-10-CM | POA: Diagnosis not present

## 2021-07-10 DIAGNOSIS — M25522 Pain in left elbow: Secondary | ICD-10-CM | POA: Diagnosis not present

## 2021-07-10 DIAGNOSIS — M25562 Pain in left knee: Secondary | ICD-10-CM | POA: Diagnosis not present

## 2021-07-12 ENCOUNTER — Encounter: Payer: Medicare HMO | Admitting: Physical Therapy

## 2021-07-26 ENCOUNTER — Encounter: Payer: Medicare HMO | Admitting: Physical Therapy

## 2021-08-07 ENCOUNTER — Encounter: Payer: Medicare HMO | Admitting: Physical Therapy

## 2021-08-08 ENCOUNTER — Other Ambulatory Visit: Payer: Self-pay | Admitting: Family Medicine

## 2021-08-08 DIAGNOSIS — M1990 Unspecified osteoarthritis, unspecified site: Secondary | ICD-10-CM

## 2021-09-04 ENCOUNTER — Encounter: Payer: Medicare HMO | Admitting: Physical Therapy

## 2021-09-18 ENCOUNTER — Encounter: Payer: Medicare HMO | Admitting: Physical Therapy

## 2021-09-26 ENCOUNTER — Telehealth: Payer: Self-pay | Admitting: Family Medicine

## 2021-09-26 DIAGNOSIS — K219 Gastro-esophageal reflux disease without esophagitis: Secondary | ICD-10-CM

## 2021-09-26 MED ORDER — OMEPRAZOLE 40 MG PO CPDR: 40.0000 mg | DELAYED_RELEASE_CAPSULE | Freq: Every day | ORAL | 0 refills | Status: DC

## 2021-09-26 NOTE — Telephone Encounter (Signed)
Pt aware refill sent to pharmacy & 6 mos ckup made for March

## 2021-09-26 NOTE — Telephone Encounter (Signed)
°  Prescription Request  09/26/2021  What is the name of the medication or equipment? OMEPRAZOLE  Have you contacted your pharmacy to request a refill? YES  Which pharmacy would you like this sent to? [facility]  Pt had her last visit with PCP on 06/22/21 and says more refills were supposed to be sent in at that time but were not. Pt is out of this medicine and needs refills sent in.

## 2021-10-02 ENCOUNTER — Encounter: Payer: Self-pay | Admitting: Family Medicine

## 2021-10-05 DIAGNOSIS — R69 Illness, unspecified: Secondary | ICD-10-CM | POA: Diagnosis not present

## 2021-10-05 DIAGNOSIS — F4310 Post-traumatic stress disorder, unspecified: Secondary | ICD-10-CM | POA: Diagnosis not present

## 2021-10-05 DIAGNOSIS — F341 Dysthymic disorder: Secondary | ICD-10-CM | POA: Diagnosis not present

## 2021-10-19 ENCOUNTER — Other Ambulatory Visit: Payer: Self-pay | Admitting: Family Medicine

## 2021-10-19 DIAGNOSIS — M25562 Pain in left knee: Secondary | ICD-10-CM | POA: Diagnosis not present

## 2021-10-19 DIAGNOSIS — M1990 Unspecified osteoarthritis, unspecified site: Secondary | ICD-10-CM

## 2021-10-30 DIAGNOSIS — M25562 Pain in left knee: Secondary | ICD-10-CM | POA: Diagnosis not present

## 2021-11-02 DIAGNOSIS — M25561 Pain in right knee: Secondary | ICD-10-CM | POA: Diagnosis not present

## 2021-11-02 DIAGNOSIS — M25562 Pain in left knee: Secondary | ICD-10-CM | POA: Diagnosis not present

## 2021-11-02 DIAGNOSIS — S83242D Other tear of medial meniscus, current injury, left knee, subsequent encounter: Secondary | ICD-10-CM | POA: Diagnosis not present

## 2021-11-10 DIAGNOSIS — M25511 Pain in right shoulder: Secondary | ICD-10-CM | POA: Diagnosis not present

## 2021-11-19 DIAGNOSIS — M25511 Pain in right shoulder: Secondary | ICD-10-CM | POA: Diagnosis not present

## 2021-11-21 HISTORY — PX: KNEE SURGERY: SHX244

## 2021-11-22 ENCOUNTER — Other Ambulatory Visit: Payer: Self-pay | Admitting: Family Medicine

## 2021-11-22 DIAGNOSIS — N3946 Mixed incontinence: Secondary | ICD-10-CM

## 2021-11-24 DIAGNOSIS — M7541 Impingement syndrome of right shoulder: Secondary | ICD-10-CM | POA: Diagnosis not present

## 2021-11-24 DIAGNOSIS — S43431A Superior glenoid labrum lesion of right shoulder, initial encounter: Secondary | ICD-10-CM | POA: Diagnosis not present

## 2021-11-24 DIAGNOSIS — M19011 Primary osteoarthritis, right shoulder: Secondary | ICD-10-CM | POA: Diagnosis not present

## 2021-12-01 DIAGNOSIS — M19011 Primary osteoarthritis, right shoulder: Secondary | ICD-10-CM | POA: Diagnosis not present

## 2021-12-07 DIAGNOSIS — M19011 Primary osteoarthritis, right shoulder: Secondary | ICD-10-CM | POA: Insufficient documentation

## 2021-12-13 ENCOUNTER — Other Ambulatory Visit: Payer: Self-pay | Admitting: Family Medicine

## 2021-12-13 DIAGNOSIS — K219 Gastro-esophageal reflux disease without esophagitis: Secondary | ICD-10-CM

## 2021-12-19 DIAGNOSIS — S83242D Other tear of medial meniscus, current injury, left knee, subsequent encounter: Secondary | ICD-10-CM | POA: Diagnosis not present

## 2021-12-19 DIAGNOSIS — M958 Other specified acquired deformities of musculoskeletal system: Secondary | ICD-10-CM | POA: Diagnosis not present

## 2021-12-19 DIAGNOSIS — M23222 Derangement of posterior horn of medial meniscus due to old tear or injury, left knee: Secondary | ICD-10-CM | POA: Diagnosis not present

## 2021-12-19 DIAGNOSIS — G8918 Other acute postprocedural pain: Secondary | ICD-10-CM | POA: Diagnosis not present

## 2021-12-19 DIAGNOSIS — S83242A Other tear of medial meniscus, current injury, left knee, initial encounter: Secondary | ICD-10-CM | POA: Diagnosis not present

## 2021-12-20 ENCOUNTER — Ambulatory Visit: Payer: Medicare HMO | Admitting: Family Medicine

## 2021-12-22 ENCOUNTER — Other Ambulatory Visit: Payer: Self-pay | Admitting: Family Medicine

## 2021-12-22 DIAGNOSIS — N3946 Mixed incontinence: Secondary | ICD-10-CM

## 2021-12-28 ENCOUNTER — Ambulatory Visit (INDEPENDENT_AMBULATORY_CARE_PROVIDER_SITE_OTHER): Payer: Medicare HMO | Admitting: Family Medicine

## 2021-12-28 ENCOUNTER — Encounter: Payer: Self-pay | Admitting: Family Medicine

## 2021-12-28 VITALS — BP 130/89 | HR 73 | Temp 97.6°F | Ht 61.0 in | Wt 161.4 lb

## 2021-12-28 DIAGNOSIS — M1990 Unspecified osteoarthritis, unspecified site: Secondary | ICD-10-CM

## 2021-12-28 DIAGNOSIS — G629 Polyneuropathy, unspecified: Secondary | ICD-10-CM | POA: Diagnosis not present

## 2021-12-28 DIAGNOSIS — G8929 Other chronic pain: Secondary | ICD-10-CM

## 2021-12-28 DIAGNOSIS — E559 Vitamin D deficiency, unspecified: Secondary | ICD-10-CM

## 2021-12-28 DIAGNOSIS — M25562 Pain in left knee: Secondary | ICD-10-CM

## 2021-12-28 DIAGNOSIS — F419 Anxiety disorder, unspecified: Secondary | ICD-10-CM

## 2021-12-28 DIAGNOSIS — K219 Gastro-esophageal reflux disease without esophagitis: Secondary | ICD-10-CM | POA: Diagnosis not present

## 2021-12-28 DIAGNOSIS — R7303 Prediabetes: Secondary | ICD-10-CM | POA: Diagnosis not present

## 2021-12-28 DIAGNOSIS — E782 Mixed hyperlipidemia: Secondary | ICD-10-CM | POA: Diagnosis not present

## 2021-12-28 DIAGNOSIS — N3946 Mixed incontinence: Secondary | ICD-10-CM | POA: Diagnosis not present

## 2021-12-28 DIAGNOSIS — Z87898 Personal history of other specified conditions: Secondary | ICD-10-CM | POA: Diagnosis not present

## 2021-12-28 DIAGNOSIS — R790 Abnormal level of blood mineral: Secondary | ICD-10-CM

## 2021-12-28 DIAGNOSIS — F32A Depression, unspecified: Secondary | ICD-10-CM

## 2021-12-28 DIAGNOSIS — R69 Illness, unspecified: Secondary | ICD-10-CM | POA: Diagnosis not present

## 2021-12-28 LAB — CMP14+EGFR
ALT: 17 IU/L (ref 0–32)
AST: 17 IU/L (ref 0–40)
Albumin/Globulin Ratio: 2.2 (ref 1.2–2.2)
Albumin: 4.2 g/dL (ref 3.8–4.9)
Alkaline Phosphatase: 77 IU/L (ref 44–121)
BUN/Creatinine Ratio: 21 (ref 12–28)
BUN: 22 mg/dL (ref 8–27)
Bilirubin Total: 0.4 mg/dL (ref 0.0–1.2)
CO2: 25 mmol/L (ref 20–29)
Calcium: 9.5 mg/dL (ref 8.7–10.3)
Chloride: 106 mmol/L (ref 96–106)
Creatinine, Ser: 1.05 mg/dL — ABNORMAL HIGH (ref 0.57–1.00)
Globulin, Total: 1.9 g/dL (ref 1.5–4.5)
Glucose: 102 mg/dL — ABNORMAL HIGH (ref 70–99)
Potassium: 4.9 mmol/L (ref 3.5–5.2)
Sodium: 141 mmol/L (ref 134–144)
Total Protein: 6.1 g/dL (ref 6.0–8.5)
eGFR: 61 mL/min/{1.73_m2} (ref >59)

## 2021-12-28 LAB — CBC WITH DIFFERENTIAL/PLATELET
Basophils Absolute: 0 10*3/uL (ref 0.0–0.2)
Basos: 1 %
EOS (ABSOLUTE): 0.1 10*3/uL (ref 0.0–0.4)
Eos: 2 %
Hematocrit: 43.6 % (ref 34.0–46.6)
Hemoglobin: 14.3 g/dL (ref 11.1–15.9)
Immature Grans (Abs): 0 10*3/uL (ref 0.0–0.1)
Immature Granulocytes: 0 %
Lymphocytes Absolute: 1.4 10*3/uL (ref 0.7–3.1)
Lymphs: 21 %
MCH: 30.9 pg (ref 26.6–33.0)
MCHC: 32.8 g/dL (ref 31.5–35.7)
MCV: 94 fL (ref 79–97)
Monocytes Absolute: 0.5 10*3/uL (ref 0.1–0.9)
Monocytes: 7 %
Neutrophils Absolute: 4.6 10*3/uL (ref 1.4–7.0)
Neutrophils: 69 %
Platelets: 227 10*3/uL (ref 150–450)
RBC: 4.63 x10E6/uL (ref 3.77–5.28)
RDW: 12.7 % (ref 11.7–15.4)
WBC: 6.6 10*3/uL (ref 3.4–10.8)

## 2021-12-28 LAB — BAYER DCA HB A1C WAIVED: HB A1C (BAYER DCA - WAIVED): 5.5 % (ref 4.8–5.6)

## 2021-12-28 LAB — LIPID PANEL
Chol/HDL Ratio: 2.6 ratio (ref 0.0–4.4)
Cholesterol, Total: 178 mg/dL (ref 100–199)
HDL: 69 mg/dL (ref >39)
LDL Chol Calc (NIH): 97 mg/dL (ref 0–99)
Triglycerides: 60 mg/dL (ref 0–149)
VLDL Cholesterol Cal: 12 mg/dL (ref 5–40)

## 2021-12-28 MED ORDER — BUPROPION HCL ER (XL) 300 MG PO TB24: 300.0000 mg | ORAL_TABLET | Freq: Every day | ORAL | 1 refills | Status: DC

## 2021-12-28 MED ORDER — ATORVASTATIN CALCIUM 40 MG PO TABS: 40.0000 mg | ORAL_TABLET | Freq: Every evening | ORAL | 1 refills | Status: DC

## 2021-12-28 MED ORDER — METHOCARBAMOL 500 MG PO TABS: 500.0000 mg | ORAL_TABLET | Freq: Three times a day (TID) | ORAL | 1 refills | Status: DC | PRN

## 2021-12-28 MED ORDER — GABAPENTIN 300 MG PO CAPS: ORAL_CAPSULE | ORAL | 1 refills | Status: DC

## 2021-12-28 MED ORDER — TOLTERODINE TARTRATE ER 2 MG PO CP24: 2.0000 mg | ORAL_CAPSULE | Freq: Every day | ORAL | 1 refills | Status: DC

## 2021-12-28 MED ORDER — SERTRALINE HCL 100 MG PO TABS: 200.0000 mg | ORAL_TABLET | Freq: Every day | ORAL | 1 refills | Status: DC

## 2021-12-28 NOTE — Progress Notes (Signed)
? ?Assessment & Plan:  ?1. Mixed hyperlipidemia ?Labs to assess. ?- Lipid panel ?- CBC with Differential/Platelet ?- CMP14+EGFR ?- atorvastatin (LIPITOR) 40 MG tablet; Take 1 tablet (40 mg total) by mouth every evening.  Dispense: 90 tablet; Refill: 1 ? ?2. History of prediabetes ?A1c remains in a normal range at 5.5 today. ?- Bayer DCA Hb A1c Waived ? ?3. Anxiety and depression ?Well controlled on current regimen. Continue therapy. ?- CMP14+EGFR ?- buPROPion (WELLBUTRIN XL) 300 MG 24 hr tablet; Take 1 tablet (300 mg total) by mouth daily.  Dispense: 90 tablet; Refill: 1 ?- sertraline (ZOLOFT) 100 MG tablet; Take 2 tablets (200 mg total) by mouth daily.  Dispense: 180 tablet; Refill: 1 ? ?4. Neuropathy ?Well controlled on current regimen.  ?- CMP14+EGFR ?- gabapentin (NEURONTIN) 300 MG capsule; Take 1 capsule (300 mg total) by mouth in the morning AND 2 capsules (600 mg total) at bedtime.  Dispense: 270 capsule; Refill: 1 ? ?5. Mixed stress and urge urinary incontinence ?Uncontrolled. D/C Ditropan. Start Detrol LA. ?- CMP14+EGFR ?- tolterodine (DETROL LA) 2 MG 24 hr capsule; Take 1 capsule (2 mg total) by mouth daily.  Dispense: 90 capsule; Refill: 1 ? ?6. Vitamin D insufficiency ?Labs to assess. ?- VITAMIN D 25 Hydroxy (Vit-D Deficiency, Fractures) ? ?7. Low ferritin ?Labs to assess. ?- Ferritin ? ?8. Gastroesophageal reflux disease, unspecified whether esophagitis present ?Well controlled on current regimen.  ?- CMP14+EGFR ? ?9. Arthritis ?Well controlled on current regimen.  ?- CMP14+EGFR ? ?10. Chronic pain of left knee ?Well controlled on current regimen.  ?- methocarbamol (ROBAXIN) 500 MG tablet; Take 1 tablet (500 mg total) by mouth every 8 (eight) hours as needed for muscle spasms.  Dispense: 60 tablet; Refill: 1 ? ? ?Return in about 6 months (around 06/30/2022) for annual physical & f/u UI in 4-6 weeks . ? ?Hendricks Limes, MSN, APRN, FNP-C ?[facility] ? ?Subjective:  ? ? Patient ID:  Julia [name], female    DOB: Mar 25, 1961, 61 y.o.   MRN: [id number] ? ?Patient Care Team: ?Loman Brooklyn, FNP as PCP - General (Family Medicine) ?[employer] (Psychiatry) ?Atilano Ina, MD as Referring Physician (Neurosurgery) ?Hennie Duos, MD as Consulting Physician (Rheumatology) ?Nahser, Wonda Cheng, MD as Consulting Physician (Cardiology) ?Margaretha Seeds, MD as Consulting Physician (Pulmonary Disease) ?Harlen Labs, MD as Referring Physician (Optometry)  ? ?Chief Complaint:  ?Chief Complaint  ?Patient presents with  ? Medical Management of Chronic Issues  ? ? ?HPI: ?Julia [name] is a 61 y.o. female presenting on 12/28/2021 for Medical Management of Chronic Issues ? ?Hyperlipidemia: patient is tolerating Atorvastatin.  ?The 10-year ASCVD risk score (Arnett DK, et al., 2019) is: 2.6% ?  Values used to calculate the score: ?    Age: 61 years ?    Sex: Female ?    Is Non-Hispanic African American: No ?    Diabetic: No ?    Tobacco smoker: No ?    Systolic Blood Pressure: 202 mmHg ?    Is BP treated: No ?    HDL Cholesterol: 73 mg/dL ?    Total Cholesterol: 203 mg/dL ? ?Prediabetes: most recent A1c normal at 5.4 six months ago. ? ?Arthritis: previously taking meloxicam for pain, but is currently taking hydrocodone due to recent knee arthroscopy.  ? ?GERD: taking omeprazole which controls symptoms.  ? ?Anxiety/Depression previously managed by [facility], however her provider has left and she is currently looking for a new one. She  is planning to start counseling at [facility] and would like me to take over prescribing Wellbutrin and Zoloft. She feels she is doing well on her current regimen. ? ? ?  12/28/2021  ?  8:03 AM 06/22/2021  ?  8:47 AM 05/08/2021  ?  9:12 AM  ?Depression screen PHQ 2/9  ?Decreased Interest _0 ?Down, Depressed, Hopeless _1 ?PHQ - 2 Score _2 ?Altered sleeping _3 ?Tired, decreased energy _4 ?Change in appetite _5 ?Feeling bad or failure about yourself  _6 ?Trouble concentrating _7 ?Moving slowly or fidgety/restless 1 1 0  ?Suicidal thoughts 1 0 0  ?PHQ-9 Score _8 ?Difficult doing work/chores Very difficult Somewhat difficult Somewhat difficult  ? ? ?  12/28/2021  ?  8:03 AM 06/22/2021  ?  8:47 AM 02/12/2021  ?  9:20 AM 06/28/2020  ?  3:29 PM  ?GAD 7 : Generalized Anxiety Score  ?Nervous, Anxious, on Edge _9 ?Control/stop worrying _10 ?Worry too much - different things _11 ?Trouble relaxing _12 ?Restless _13 ?Easily annoyed or irritable _14 ?Afraid - awful might happen _15 ?Total GAD 7 Score _16 ?Anxiety Difficulty Very difficult Very difficult Very difficult   ? ?Urge Incontinence: She was previously referred to pelvic floor physical therapy and only went to 3 sessions. She was placed on Myrbetriq but was unable to take due to cost of copay. At our last visit she was started on Ditropan XL 5 mg daily. She reports today it really dries her out. ? ?Low Ferritin: takes an iron supplement. Ferritin level back to normal six months ago.  ? ?Vitamin D Insufficiency: vitamin D level normal six months ago. Takes a vitamin D supplement. ? ?New complaints: ?None ? ? ?Social history: ? ?Relevant past medical, surgical, family and social history reviewed and updated as indicated. Interim medical history since our last visit reviewed. ? ?Allergies and medications reviewed and updated. ? ?DATA REVIEWED: CHART IN EPIC ? ?ROS: Negative unless specifically indicated above in HPI.  ? ? ?Current Outpatient Medications:  ?  aspirin 81 MG EC tablet, Take 1 tablet by mouth daily., Disp: , Rfl:  ?  atorvastatin (LIPITOR) 40 MG tablet, Take 1 tablet (40 mg total) by mouth every evening., Disp: 90 tablet, Rfl: 1 ?  buPROPion (WELLBUTRIN XL) 300 MG 24 hr tablet, Take 300 mg by mouth daily. , Disp: , Rfl:  ?  Cholecalciferol (VITAMIN D3) 50 MCG (2000 UT) capsule, Take 2,000 Units by mouth daily., Disp: , Rfl:  ?  ferrous sulfate 325 (65 FE)  MG tablet, Take 325 mg by mouth daily., Disp: , Rfl:  ?  gabapentin (NEURONTIN) 300 MG capsule, Take 1 capsule (300 mg total) by mouth 2 (two) times daily. (Patient taking differently: Take 300 mg by mouth 2 (two) times daily. 1 capsule in the morning and 2 at night), Disp: 180 capsule, Rfl: 1 ?  HYDROcodone-acetaminophen (NORCO/VICODIN) 5-325 MG tablet, hydrocodone 5 mg-acetaminophen 325 mg tablet  TAKE ONE TABLET EVERY 6 HOURS AS NEEDED FOR PAIN, Disp: , Rfl:  ?  meloxicam (MOBIC) 15 MG tablet, TAKE ONE TABLET ONCE DAILY, Disp: 90 tablet, Rfl: 0 ?  methocarbamol (ROBAXIN) 500 MG  tablet, Take 1 tablet (500 mg total) by mouth every 8 (eight) hours as needed for muscle spasms., Disp: 60 tablet, Rfl: 1 ?  omeprazole (PRILOSEC) 40 MG capsule, TAKE 1 CAPSULE DAILY, Disp: 90 capsule, Rfl: 0 ?  oxybutynin (DITROPAN-XL) 5 MG 24 hr tablet, TAKE ONE TABLET AT BEDTIME, Disp: 30 tablet, Rfl: 0 ?  Probiotic Product (PROBIOTIC DAILY PO), Take 1 tablet by mouth daily., Disp: , Rfl:  ?  sertraline (ZOLOFT) 100 MG tablet, Take 200 mg by mouth daily. , Disp: , Rfl:  ?  vitamin B-12 (CYANOCOBALAMIN) 1000 MCG tablet, Take 1,000 mcg by mouth daily., Disp: , Rfl:   ? ?Allergies  ?Allergen Reactions  ? Lamotrigine Itching and Swelling  ? ?Past Medical History:  ?Diagnosis Date  ? Anxiety   ? Arthritis   ? Complication of anesthesia   ? slow to wake up-does not take much medicine to sedate her  ? Depression   ? Esophageal reflux   ? Hyperlipidemia   ? Low ferritin 02/10/2021  ? Paget disease of bone   ? Pneumonia due to COVID-19 virus 05/20/2020  ? Vitamin B12 deficiency 02/10/2021  ? Vitamin D insufficiency 02/10/2021  ? Wears glasses   ?  ?Past Surgical History:  ?Procedure Laterality Date  ? ANTERIOR FUSION CERVICAL SPINE  10/2019  ? C3-C5  ? CERVICAL SPINE SURGERY  10/2011  ? CESAREAN SECTION    ? DILATION AND CURETTAGE OF UTERUS  1986  ? KNEE ARTHROSCOPY Left 07/22/2013  ? Procedure: LEFT KNEE ARTHROSCOPY WITH DEBRIDEMENT  CHONDROPLASTY, REMOVAL LOOSE BODIES, PARTIAL MEDIAL MENISCECTOMY, OPEN EXCISION OF PES GANGLION;  Surgeon: Kerin Salen, MD;  Location: [place];  Service: Orthopedics;  Laterality: Left;  NO

## 2021-12-28 NOTE — Patient Instructions (Signed)
[facility] ?

## 2021-12-29 DIAGNOSIS — M25511 Pain in right shoulder: Secondary | ICD-10-CM | POA: Diagnosis not present

## 2021-12-29 DIAGNOSIS — M25522 Pain in left elbow: Secondary | ICD-10-CM | POA: Diagnosis not present

## 2021-12-30 LAB — VITAMIN D 25 HYDROXY (VIT D DEFICIENCY, FRACTURES): Vit D, 25-Hydroxy: 29.4 ng/mL — ABNORMAL LOW (ref 30.0–100.0)

## 2021-12-30 LAB — SPECIMEN STATUS REPORT

## 2021-12-30 LAB — FERRITIN: Ferritin: 66 ng/mL (ref 15–150)

## 2022-01-11 DIAGNOSIS — M25522 Pain in left elbow: Secondary | ICD-10-CM | POA: Diagnosis not present

## 2022-01-11 DIAGNOSIS — M7712 Lateral epicondylitis, left elbow: Secondary | ICD-10-CM | POA: Diagnosis not present

## 2022-01-22 DIAGNOSIS — M25522 Pain in left elbow: Secondary | ICD-10-CM | POA: Diagnosis not present

## 2022-01-25 ENCOUNTER — Encounter: Payer: Self-pay | Admitting: Family Medicine

## 2022-01-25 ENCOUNTER — Ambulatory Visit: Payer: Medicare HMO | Admitting: Family Medicine

## 2022-01-25 ENCOUNTER — Ambulatory Visit (INDEPENDENT_AMBULATORY_CARE_PROVIDER_SITE_OTHER): Payer: Medicare HMO | Admitting: Family Medicine

## 2022-01-25 VITALS — BP 131/86 | HR 77 | Temp 97.2°F | Ht 61.0 in | Wt 165.2 lb

## 2022-01-25 DIAGNOSIS — N3946 Mixed incontinence: Secondary | ICD-10-CM

## 2022-01-25 NOTE — Progress Notes (Signed)
? ?Assessment & Plan:  ?1. Mixed stress and urge urinary incontinence ?Uncontrolled. Patient does not wish to try any further medications at this time until Myrbetriq comes out with a generic that she can afford. ? ? ?Return as scheduled. ? ?Julia BostonBritney Janiya Millirons, MSN, APRN, FNP-C ?[facility] ? ?Subjective:  ? ? Patient ID: Julia [name] Benevides, female    DOB: 12/19/1960, 61 y.o.   MRN: [id number] ? ?Patient Care Team: ?Gwenlyn FudgeJoyce, Anyely Cunning F, FNP as PCP - General (Family Medicine) ?[facility] ([facility]) ?Diamantina ProvidencePowers, Alexander, MD as Referring Physician (Neurosurgery) ?Donnetta HailBeekman, James F, MD as Consulting Physician (Rheumatology) ?Nahser, Deloris PingPhilip J, MD as Consulting Physician (Cardiology) ?Luciano CutterEllison, Chi Jane, MD as Consulting Physician (Pulmonary Disease) ?Michaelle CopasLe, Yen Thi Hong, MD as Referring Physician (Optometry)  ? ?Chief Complaint:  ?Chief Complaint  ?Patient presents with  ? urinary incontience  ?  4 week follow up   ? ? ?HPI: ?Julia [name] is a 61 y.o. female presenting on 01/25/2022 for urinary incontience (4 week follow up ) ? ?Urge Incontinence: She was previously referred to pelvic floor physical therapy and only went to 3 sessions. She was placed on Myrbetriq but was unable to take due to cost of copay. Ditropan XL 5 mg daily dried her out too much and she did not wish to continue it. At our last visit she was started on Detrol LA ad reports today it dries her out as well and she does not help with incontinence.  ? ?New complaints: ?None ? ? ?Social history: ? ?Relevant past medical, surgical, family and social history reviewed and updated as indicated. Interim medical history since our last visit reviewed. ? ?Allergies and medications reviewed and updated. ? ?DATA REVIEWED: CHART IN EPIC ? ?ROS: Negative unless specifically indicated above in HPI.  ? ? ?Current Outpatient Medications:  ?  atorvastatin (LIPITOR) 40 MG tablet, Take 1 tablet (40 mg total) by mouth every evening., Disp: 90 tablet, Rfl:  1 ?  buPROPion (WELLBUTRIN XL) 300 MG 24 hr tablet, Take 1 tablet (300 mg total) by mouth daily., Disp: 90 tablet, Rfl: 1 ?  Cholecalciferol (VITAMIN D3) 50 MCG (2000 UT) capsule, Take 2,000 Units by mouth daily., Disp: , Rfl:  ?  ferrous sulfate 325 (65 FE) MG tablet, Take 325 mg by mouth daily., Disp: , Rfl:  ?  gabapentin (NEURONTIN) 300 MG capsule, Take 1 capsule (300 mg total) by mouth in the morning AND 2 capsules (600 mg total) at bedtime., Disp: 270 capsule, Rfl: 1 ?  meloxicam (MOBIC) 15 MG tablet, TAKE ONE TABLET ONCE DAILY, Disp: 90 tablet, Rfl: 0 ?  methocarbamol (ROBAXIN) 500 MG tablet, Take 1 tablet (500 mg total) by mouth every 8 (eight) hours as needed for muscle spasms., Disp: 60 tablet, Rfl: 1 ?  omeprazole (PRILOSEC) 40 MG capsule, TAKE 1 CAPSULE DAILY, Disp: 90 capsule, Rfl: 0 ?  Probiotic Product (PROBIOTIC DAILY PO), Take 1 tablet by mouth daily., Disp: , Rfl:  ?  sertraline (ZOLOFT) 100 MG tablet, Take 2 tablets (200 mg total) by mouth daily., Disp: 180 tablet, Rfl: 1 ?  tolterodine (DETROL LA) 2 MG 24 hr capsule, Take 1 capsule (2 mg total) by mouth daily., Disp: 90 capsule, Rfl: 1 ?  vitamin B-12 (CYANOCOBALAMIN) 1000 MCG tablet, Take 1,000 mcg by mouth daily., Disp: , Rfl:  ?  aspirin 81 MG EC tablet, Take 1 tablet by mouth daily. (Patient not taking: Reported on 01/25/2022), Disp: , Rfl:   ? ?Allergies  ?  Allergen Reactions  ? Lamotrigine Itching and Swelling  ? ?Past Medical History:  ?Diagnosis Date  ? Anxiety   ? Arthritis   ? Complication of anesthesia   ? slow to wake up-does not take much medicine to sedate her  ? Depression   ? Esophageal reflux   ? Hyperlipidemia   ? Low ferritin 02/10/2021  ? Paget disease of bone   ? Pneumonia due to COVID-19 virus 05/20/2020  ? Vitamin B12 deficiency 02/10/2021  ? Vitamin D insufficiency 02/10/2021  ? Wears glasses   ?  ?Past Surgical History:  ?Procedure Laterality Date  ? ANTERIOR FUSION CERVICAL SPINE  10/2019  ? C3-C5  ? CERVICAL SPINE SURGERY   10/2011  ? CESAREAN SECTION    ? DILATION AND CURETTAGE OF UTERUS  1986  ? KNEE ARTHROSCOPY Left 07/22/2013  ? Procedure: LEFT KNEE ARTHROSCOPY WITH DEBRIDEMENT CHONDROPLASTY, REMOVAL LOOSE BODIES, PARTIAL MEDIAL MENISCECTOMY, OPEN EXCISION OF PES GANGLION;  Surgeon: [name], MD;  Location: [facility];  Service: Orthopedics;  Laterality: Left;  NO SPECIMEN SENT PER DR. [name] ORDER.  ? KNEE SURGERY Left 11/21/2021  ? SPINE SURGERY  10/26/2019  ? TOTAL ABDOMINAL HYSTERECTOMY  05/2010  ? TAH  ? TUBAL LIGATION  1989  ?  ?Social History  ? ?Socioeconomic History  ? Marital status: Significant Other  ?  Spouse name: Julia [name]  ? Number of children: 3  ? Years of education: Not on file  ? Highest education level: Not on file  ?Occupational History  ? Not on file  ?Tobacco Use  ? Smoking status: Former  ?  Packs/day: 0.15  ?  Years: 5.00  ?  Pack years: 0.75  ?  Types: Cigarettes  ?  Quit date: 01/02/1985  ?  Years since quitting: 37.0  ? Smokeless tobacco: Never  ? Tobacco comments:  ?  1 pack per week per pt.   ?Vaping Use  ? Vaping Use: Never used  ?Substance and Sexual Activity  ? Alcohol use: Yes  ?  Alcohol/week: 1.0 standard drink  ?  Types: 1 Glasses of wine per week  ?  Comment: occassional  ? Drug use: No  ? Sexual activity: Not Currently  ?Other Topics Concern  ? Not on file  ?Social History Narrative  ? 2 sons live nearby, one son in [place]   ? ?Social Determinants of Health  ? ?Financial Resource Strain: Medium Risk  ? Difficulty of Paying Living Expenses: Somewhat hard  ?Food Insecurity: Food Insecurity Present  ? Worried About Programme researcher, broadcasting/film/video in the Last Year: Sometimes true  ? Ran Out of Food in the Last Year: Never true  ?Transportation Needs: No Transportation Needs  ? Lack of Transportation (Medical): No  ? Lack of Transportation (Non-Medical): No  ?Physical Activity: Insufficiently Active  ? Days of Exercise per Week: 3 days  ? Minutes of Exercise per Session: 30 min   ?Stress: Stress Concern Present  ? Feeling of Stress : To some extent  ?Social Connections: Moderately Integrated  ? Frequency of Communication with Friends and Family: More than three times a week  ? Frequency of Social Gatherings with Friends and Family: Twice a week  ? Attends Religious Services: 1 to 4 times per year  ? Active Member of Clubs or Organizations: No  ? Attends Banker Meetings: Never  ? Marital Status: Living with partner  ?Intimate Partner Violence: Not At Risk  ? Fear of Current or Ex-Partner:  No  ? Emotionally Abused: No  ? Physically Abused: No  ? Sexually Abused: No  ?  ? ?   ?Objective:  ?  ?BP 131/86   Pulse 77   Temp (!) 97.2 ?F (36.2 ?C) (Temporal)   Ht 5\' 1"  (1.549 [name])   Wt 165 lb 3.2 oz (74.9 kg)   LMP 01/02/2010   SpO2 96%   BMI 31.21 kg/[name]?  ? ?Wt Readings from Last 3 Encounters:  ?01/25/22 165 lb 3.2 oz (74.9 kg)  ?12/28/21 161 lb 6.4 oz (73.2 kg)  ?06/22/21 157 lb (71.2 kg)  ? ? ?Physical Exam ?Vitals reviewed.  ?Constitutional:   ?   General: She is not in acute distress. ?   Appearance: Normal appearance. She is not ill-appearing, toxic-appearing or diaphoretic.  ?HENT:  ?   Head: Normocephalic and atraumatic.  ?Eyes:  ?   General: No scleral icterus.    ?   Right eye: No discharge.     ?   Left eye: No discharge.  ?   Conjunctiva/sclera: Conjunctivae normal.  ?Cardiovascular:  ?   Rate and Rhythm: Normal rate.  ?Pulmonary:  ?   Effort: Pulmonary effort is normal. No respiratory distress.  ?Musculoskeletal:     ?   General: Normal range of motion.  ?   Cervical back: Normal range of motion.  ?Skin: ?   General: Skin is warm and dry.  ?   Capillary Refill: Capillary refill takes less than 2 seconds.  ?Neurological:  ?   General: No focal deficit present.  ?   Mental Status: She is alert and oriented to person, place, and time. Mental status is at baseline.  ?Psychiatric:     ?   Mood and Affect: Mood normal.     ?   Behavior: Behavior normal.     ?   Thought  Content: Thought content normal.     ?   Judgment: Judgment normal.  ? ? ?Lab Results  ?Component Value Date  ? TSH 2.100 02/08/2021  ? ?Lab Results  ?Component Value Date  ? WBC 6.6 12/28/2021  ? HGB 14.3 12/28/2021

## 2022-01-30 DIAGNOSIS — M25522 Pain in left elbow: Secondary | ICD-10-CM | POA: Diagnosis not present

## 2022-02-01 ENCOUNTER — Other Ambulatory Visit: Payer: Self-pay | Admitting: Family Medicine

## 2022-02-01 DIAGNOSIS — M1990 Unspecified osteoarthritis, unspecified site: Secondary | ICD-10-CM

## 2022-02-01 DIAGNOSIS — M25562 Pain in left knee: Secondary | ICD-10-CM | POA: Diagnosis not present

## 2022-02-06 DIAGNOSIS — M25522 Pain in left elbow: Secondary | ICD-10-CM | POA: Diagnosis not present

## 2022-02-07 DIAGNOSIS — M25562 Pain in left knee: Secondary | ICD-10-CM | POA: Diagnosis not present

## 2022-02-14 DIAGNOSIS — M25562 Pain in left knee: Secondary | ICD-10-CM | POA: Diagnosis not present

## 2022-02-19 DIAGNOSIS — M25522 Pain in left elbow: Secondary | ICD-10-CM | POA: Diagnosis not present

## 2022-02-21 DIAGNOSIS — M25562 Pain in left knee: Secondary | ICD-10-CM | POA: Diagnosis not present

## 2022-02-27 DIAGNOSIS — M25562 Pain in left knee: Secondary | ICD-10-CM | POA: Diagnosis not present

## 2022-02-28 DIAGNOSIS — M7712 Lateral epicondylitis, left elbow: Secondary | ICD-10-CM | POA: Diagnosis not present

## 2022-03-07 DIAGNOSIS — M25562 Pain in left knee: Secondary | ICD-10-CM | POA: Diagnosis not present

## 2022-03-14 DIAGNOSIS — M25562 Pain in left knee: Secondary | ICD-10-CM | POA: Diagnosis not present

## 2022-03-21 DIAGNOSIS — M25562 Pain in left knee: Secondary | ICD-10-CM | POA: Diagnosis not present

## 2022-03-23 ENCOUNTER — Other Ambulatory Visit: Payer: Self-pay | Admitting: Family Medicine

## 2022-03-23 DIAGNOSIS — M25562 Pain in left knee: Secondary | ICD-10-CM | POA: Diagnosis not present

## 2022-03-23 DIAGNOSIS — K219 Gastro-esophageal reflux disease without esophagitis: Secondary | ICD-10-CM

## 2022-03-23 DIAGNOSIS — Z5189 Encounter for other specified aftercare: Secondary | ICD-10-CM | POA: Diagnosis not present

## 2022-04-19 DIAGNOSIS — Z4789 Encounter for other orthopedic aftercare: Secondary | ICD-10-CM | POA: Diagnosis not present

## 2022-05-07 ENCOUNTER — Encounter: Payer: Self-pay | Admitting: *Deleted

## 2022-05-09 ENCOUNTER — Ambulatory Visit (INDEPENDENT_AMBULATORY_CARE_PROVIDER_SITE_OTHER): Payer: Medicare HMO

## 2022-05-09 ENCOUNTER — Telehealth: Payer: Self-pay

## 2022-05-09 VITALS — Wt 165.0 lb

## 2022-05-09 DIAGNOSIS — Z0000 Encounter for general adult medical examination without abnormal findings: Secondary | ICD-10-CM

## 2022-05-09 NOTE — Telephone Encounter (Signed)
Before I place this referral can we please verify with patient that she is still seeing psychiatry? Does she do therapy there?

## 2022-05-09 NOTE — Progress Notes (Signed)
Subjective:   Julia [name] Intrieri is a 61 y.o. female who presents for Medicare Annual (Subsequent) preventive examination.  Virtual Visit via Telephone Note  I connected with  Julia [name] Lupi on 05/09/22 at  9:00 AM EDT by telephone and verified that I am speaking with the correct person using two identifiers.  Location: Patient: Home Provider: [facility] Persons participating in the virtual visit: patient/Nurse Health Advisor   I discussed the limitations, risks, security and privacy concerns of performing an evaluation and management service by telephone and the availability of in person appointments. The patient expressed understanding and agreed to proceed.  Interactive audio and video telecommunications were attempted between this nurse and patient, however failed, due to patient having technical difficulties OR patient did not have access to video capability.  We continued and completed visit with audio only.  Some vital signs may be absent or patient reported.   Julia [name] E Julia Hanzlik, LPN   Review of Systems     Cardiac Risk Factors include: sedentary lifestyle;obesity (BMI >30kg/m2);dyslipidemia     Objective:    Today's Vitals   05/09/22 0900  Weight: 165 lb (74.8 kg)  PainSc: 7    Body mass index is 31.18 kg/[name].     05/09/2022    9:05 AM 05/08/2021    9:14 AM 04/05/2021   12:35 PM 05/22/2020    5:33 PM 05/20/2020    8:00 AM 11/23/2018    9:14 PM 12/22/2015   11:19 AM  Advanced Directives  Does Patient Have a Medical Advance Directive? No No No  No No No  Would patient like information on creating a medical advance directive? No - Patient declined No - Patient declined No - Patient declined No - Patient declined  No - Patient declined No - patient declined information    Current Medications (verified) Outpatient Encounter Medications as of 05/09/2022  Medication Sig   atorvastatin (LIPITOR) 40 MG tablet Take 1 tablet (40 mg total) by mouth every evening.   buPROPion (WELLBUTRIN  XL) 300 MG 24 hr tablet Take 1 tablet (300 mg total) by mouth daily.   Cholecalciferol (VITAMIN D3) 50 MCG (2000 UT) capsule Take 2,000 Units by mouth daily.   ferrous sulfate 325 (65 FE) MG tablet Take 325 mg by mouth daily.   gabapentin (NEURONTIN) 300 MG capsule Take 1 capsule (300 mg total) by mouth in the morning AND 2 capsules (600 mg total) at bedtime.   meloxicam (MOBIC) 15 MG tablet TAKE ONE TABLET ONCE DAILY   methocarbamol (ROBAXIN) 500 MG tablet Take 1 tablet (500 mg total) by mouth every 8 (eight) hours as needed for muscle spasms.   omeprazole (PRILOSEC) 40 MG capsule TAKE 1 CAPSULE DAILY   Probiotic Product (PROBIOTIC DAILY PO) Take 1 tablet by mouth daily.   sertraline (ZOLOFT) 100 MG tablet Take 2 tablets (200 mg total) by mouth daily.   vitamin B-12 (CYANOCOBALAMIN) 1000 MCG tablet Take 1,000 mcg by mouth daily.   No facility-administered encounter medications on file as of 05/09/2022.    Allergies (verified) Lamotrigine   History: Past Medical History:  Diagnosis Date   Anxiety    Arthritis    Complication of anesthesia    slow to wake up-does not take much medicine to sedate her   Depression    Esophageal reflux    Hyperlipidemia    Low ferritin 02/10/2021   Paget disease of bone    Pneumonia due to COVID-19 virus 05/20/2020   Vitamin B12 deficiency 02/10/2021  Vitamin D insufficiency 02/10/2021   Wears glasses    Past Surgical History:  Procedure Laterality Date   ANTERIOR FUSION CERVICAL SPINE  10/2019   C3-C5   CERVICAL SPINE SURGERY  10/2011   CESAREAN SECTION     DILATION AND CURETTAGE OF UTERUS  1986   KNEE ARTHROSCOPY Left 07/22/2013   Procedure: LEFT KNEE ARTHROSCOPY WITH DEBRIDEMENT CHONDROPLASTY, REMOVAL LOOSE BODIES, PARTIAL MEDIAL MENISCECTOMY, OPEN EXCISION OF PES GANGLION;  Surgeon: [name], MD;  Location: [facility];  Service: Orthopedics;  Laterality: Left;  NO SPECIMEN SENT PER DR. [name] ORDER.   KNEE SURGERY Left  11/21/2021   SPINE SURGERY  10/26/2019   TOTAL ABDOMINAL HYSTERECTOMY  05/2010   TAH   TUBAL LIGATION  1989   Family History  Problem Relation Age of Onset   Thyroid disease Mother    Congestive Heart Failure Mother    Heart disease Mother    Heart disease Father    Lung cancer Sister    Melanoma Sister    Cancer Sister    Depression Sister    Breast cancer Maternal Aunt    Healthy Brother    Healthy Brother    Healthy Brother    Healthy Brother    Social History   Socioeconomic History   Marital status: Significant Other    Spouse name: [name]   Number of children: 3   Years of education: Not on file   Highest education level: Not on file  Occupational History   Occupation: disability  Tobacco Use   Smoking status: Former    Packs/day: 0.15    Years: 5.00    Total pack years: 0.75    Types: Cigarettes    Quit date: 01/02/1985    Years since quitting: 37.3   Smokeless tobacco: Never   Tobacco comments:    1 pack per week per pt.   Vaping Use   Vaping Use: Never used  Substance and Sexual Activity   Alcohol use: Yes    Alcohol/week: 1.0 standard drink of alcohol    Types: 1 Glasses of wine per week    Comment: occassional   Drug use: No   Sexual activity: Not Currently  Other Topics Concern   Not on file  Social History Narrative   2 sons live nearby, one son in [place]    05/09/2022 - Has 3 grandchildren - one almost a year old   Social Determinants of Health   Financial Resource Strain: Low Risk  (05/09/2022)   Overall Financial Resource Strain (CARDIA)    Difficulty of Paying Living Expenses: Not very hard  Food Insecurity: No Food Insecurity (05/09/2022)   Hunger Vital Sign    Worried About Running Out of Food in the Last Year: Never true    Ran Out of Food in the Last Year: Never true  Transportation Needs: No Transportation Needs (05/09/2022)   PRAPARE - Administrator, Civil Service (Medical): No    Lack of Transportation  (Non-Medical): No  Physical Activity: Insufficiently Active (05/09/2022)   Exercise Vital Sign    Days of Exercise per Week: 7 days    Minutes of Exercise per Session: 10 min  Stress: Stress Concern Present (05/09/2022)   [employer] of Occupational Health - Occupational Stress Questionnaire    Feeling of Stress : Rather much  Social Connections: Socially Integrated (05/09/2022)   Social Connection and Isolation Panel [NHANES]    Frequency of Communication with Friends and  Family: More than three times a week    Frequency of Social Gatherings with Friends and Family: Once a week    Attends Religious Services: 1 to 4 times per year    Active Member of [employer] or Organizations: Yes    Attends Banker Meetings: 1 to 4 times per year    Marital Status: Living with partner    Tobacco Counseling Counseling given: Not Answered Tobacco comments: 1 pack per week per pt.    Clinical Intake:  Pre-visit preparation completed: Yes  Pain : 0-10 Pain Score: 7  Pain Type: Chronic pain Pain Location: Knee Pain Orientation: Left Pain Descriptors / Indicators: Aching, Sore Pain Onset: More than a month ago Pain Frequency: Intermittent     BMI - recorded: 31.18 Nutritional Status: BMI > 30  Obese Nutritional Risks: None Diabetes: No  How often do you need to have someone help you when you read instructions, pamphlets, or other written materials from your doctor or pharmacy?: 1 - Never  Diabetic? no  Interpreter Needed?: No  Information entered by :: Mylo Choi, LPN   Activities of Daily Living    05/09/2022    9:11 AM  In your present state of health, do you have any difficulty performing the following activities:  Hearing? 0  Vision? 0  Difficulty concentrating or making decisions? 0  Walking or climbing stairs? 0  Dressing or bathing? 0  Doing errands, shopping? 0  Preparing Food and eating ? N  Using the Toilet? N  In the past six months, have you accidently  leaked urine? Y  Comment medication didn't help or she couldn't afford  Do you have problems with loss of bowel control? N  Managing your Medications? Y  Comment forgets to take at least once per week  Managing your Finances? N  Housekeeping or managing your Housekeeping? N    Patient Care Team: Gwenlyn Fudge, FNP as PCP - General (Family Medicine) Gardiner Sleeper (Psychiatry) Diamantina Providence, MD as Referring Physician (Neurosurgery) Donnetta Hail, MD as Consulting Physician (Rheumatology) Nahser, Deloris Ping, MD as Consulting Physician (Cardiology) Luciano Cutter, MD as Consulting Physician (Pulmonary Disease) Michaelle Copas, MD as Referring Physician (Optometry)  Indicate any recent Medical Services you may have received from other than [facility] providers in the past year (date may be approximate).     Assessment:   This is a routine wellness examination for Bostic.  Hearing/Vision screen Hearing Screening - Comments:: Denies hearing difficulties; chronic tinnitus  Vision Screening - Comments:: Wears rx glasses - up to date with routine eye exams with [facility] - Will go to [facility]  Dietary issues and exercise activities discussed: Current Exercise Habits: The patient does not participate in regular exercise at present, Exercise limited by: orthopedic condition(s);psychological condition(s)   Goals Addressed             This Visit's Progress    Patient Stated       Hopes to get her knee and shoulder better, so she can do more and babysit grandchildren more.       Depression Screen    05/09/2022    9:07 AM 12/28/2021    8:03 AM 06/22/2021    8:47 AM 05/08/2021    9:12 AM 02/12/2021    9:20 AM 06/28/2020    3:29 PM 05/11/2020    4:23 PM  PHQ 2/9 Scores  PHQ - 2 Score 6 4 4 4 5 4  0  PHQ- 9 Score 20 16 17 13 20 15      Fall Risk    05/09/2022    9:02 AM 12/28/2021    8:03 AM 06/22/2021    8:46 AM 05/08/2021    9:24 AM 06/28/2020    1:26 PM  Fall  Risk   Falls in the past year? 1 1 0 0 0  Comment fell March 31, 2022 - tripped over a plant on porch while sweeping      Number falls in past yr: 1 0  0   Injury with Fall? 0 0  0   Risk for fall due to : History of fall(s);Orthopedic patient;Impaired balance/gait   Orthopedic patient;Impaired vision;Medication side effect   Follow up Education provided;Falls prevention discussed Falls prevention discussed  Education provided;Falls prevention discussed     FALL RISK PREVENTION PERTAINING TO THE HOME:  Any stairs in or around the home? No  If so, are there any without handrails? No  Home free of loose throw rugs in walkways, pet beds, electrical cords, etc? Yes  Adequate lighting in your home to reduce risk of falls? Yes   ASSISTIVE DEVICES UTILIZED TO PREVENT FALLS:  Life alert? No  Use of a cane, walker or w/c? No  Grab bars in the bathroom? Yes  Shower chair or bench in shower? Yes  Elevated toilet seat or a handicapped toilet? No   TIMED UP AND GO:  Was the test performed? No . Telephonic visit  Cognitive Function:    05/03/2020    9:11 AM  MMSE - Mini Mental State Exam  Orientation to time 5  Orientation to Place 5  Registration 3  Attention/ Calculation 5  Recall 2  Language- name 2 objects 2  Language- repeat 1  Language- follow 3 step command 3  Language- read & follow direction 1  Write a sentence 1  Copy design 1  Total score 29        05/09/2022    9:14 AM 05/08/2021    9:16 AM  6CIT Screen  What Year? 0 points 0 points  What month? 0 points 0 points  What time? 0 points 0 points  Count back from 20 0 points 0 points  Months in reverse 0 points 0 points  Repeat phrase 0 points 4 points  Total Score 0 points 4 points    Immunizations Immunization History  Administered Date(s) Administered   Influenza Split 06/22/2015, 07/03/2016   Influenza,inj,Quad PF,6+ Mos 06/25/2019, 06/28/2020, 06/22/2021   Influenza,trivalent, recombinat, inj, PF 07/16/2017    Tdap 11/11/2013   Zoster Recombinat (Shingrix) 06/25/2019, 12/11/2019    TDAP status: Up to date  Flu Vaccine status: Up to date  Pneumococcal vaccine status: Due, Education has been provided regarding the importance of this vaccine. Advised may receive this vaccine at local pharmacy or Health Dept. Aware to provide a copy of the vaccination record if obtained from local pharmacy or Health Dept. Verbalized acceptance and understanding.  Covid-19 vaccine status: Declined, Education has been provided regarding the importance of this vaccine but patient still declined. Advised may receive this vaccine at local pharmacy or Health Dept.or vaccine clinic. Aware to provide a copy of the vaccination record if obtained from local pharmacy or Health Dept. Verbalized acceptance and understanding.  Qualifies for Shingles Vaccine? Yes   Zostavax completed Yes   Shingrix Completed?: Yes  Screening Tests Health Maintenance  Topic Date Due   COVID-19 Vaccine (1) Never done   INFLUENZA VACCINE  05/01/2022  MAMMOGRAM  06/29/2023   TETANUS/TDAP  11/12/2023   DEXA SCAN  12/22/2024   COLONOSCOPY (Pts 45-37yrs Insurance coverage will need to be confirmed)  09/08/2029   Hepatitis C Screening  Completed   HIV Screening  Completed   Zoster Vaccines- Shingrix  Completed   HPV VACCINES  Aged Out    Health Maintenance  Health Maintenance Due  Topic Date Due   COVID-19 Vaccine (1) Never done   INFLUENZA VACCINE  05/01/2022    Colorectal cancer screening: Type of screening: Colonoscopy. Completed 09/09/2019. Repeat every 10 years  Mammogram status: Completed 06/28/2021. Repeat every year  Bone Density status: Completed 12/23/2019. Results reflect: Bone density results: NORMAL. Repeat every 5 years.  Lung Cancer Screening: (Low Dose CT Chest recommended if Age 47-80 years, 30 pack-year currently smoking OR have quit w/in 15years.) does not qualify  Additional Screening:  Hepatitis C Screening: does  qualify; Completed 06/25/2019  Vision Screening: Recommended annual ophthalmology exams for early detection of glaucoma and other disorders of the eye. Is the patient up to date with their annual eye exam?  Yes  Who is the provider or what is the name of the office in which the patient attends annual eye exams? [facility] - will be using [facility] next If pt is not established with a provider, would they like to be referred to a provider to establish care? No .   Dental Screening: Recommended annual dental exams for proper oral hygiene  Community Resource Referral / Chronic Care Management: CRR required this visit?  No   CCM required this visit?  No      Plan:     I have personally reviewed and noted the following in the patient's chart:   Medical and social history Use of alcohol, tobacco or illicit drugs  Current medications and supplements including opioid prescriptions.  Functional ability and status Nutritional status Physical activity Advanced directives List of other physicians Hospitalizations, surgeries, and ER visits in previous 12 months Vitals Screenings to include cognitive, depression, and falls Referrals and appointments  In addition, I have reviewed and discussed with patient certain preventive protocols, quality metrics, and best practice recommendations. A written personalized care plan for preventive services as well as general preventive health recommendations were provided to patient.     Arizona Constable, LPN   02/01/2991   Nurse Notes: PHQ-9 = 20 - she isn't homicidal/ suicidal, but does feel very down lately - phone note sent to PCP asking to refer to [facility] for counseling. She is going out of town to visit a friend near [address] and will be back 05/21/22

## 2022-05-09 NOTE — Patient Instructions (Signed)
Julia [name] , Thank you for taking time to come for your Medicare Wellness Visit. I appreciate your ongoing commitment to your health goals. Please review the following plan we discussed and let me know if I can assist you in the future.   Screening recommendations/referrals: Colonoscopy: Done 09/09/2019 - Repeat in 10 years  Mammogram: 06/28/2021 - Repeat annually  Bone Density: Done 12/23/2019 - Repeat in 5 years  Recommended yearly ophthalmology/optometry visit for glaucoma screening and checkup Recommended yearly dental visit for hygiene and checkup  Vaccinations: Influenza vaccine: Done 06/22/2021 - Repeat annually  Pneumococcal vaccine: recommend Prevnar-20 Tdap vaccine: Done 11/11/2013 - Repeat in 10 years  Shingles vaccine: Done 06/25/2019 & 12/11/2019  Covid-19: Declined  Advanced directives: Advance directive discussed with you today. Even though you declined this today, please call our office should you change your mind, and we can give you the proper paperwork for you to fill out.   Conditions/risks identified: Aim for 30 minutes of exercise or brisk walking, 6-8 glasses of water, and 5 servings of fruits and vegetables each day.   Next appointment: Follow up in one year for your annual wellness visit.  Preventive Care 40-64 Years, Female Preventive care refers to lifestyle choices and visits with your health care provider that can promote health and wellness. What does preventive care include? A yearly physical exam. This is also called an annual well check. Dental exams once or twice a year. Routine eye exams. Ask your health care provider how often you should have your eyes checked. Personal lifestyle choices, including: Daily care of your teeth and gums. Regular physical activity. Eating a healthy diet. Avoiding tobacco and drug use. Limiting alcohol use. Practicing safe sex. Taking low-dose aspirin daily starting at age 63. Taking vitamin and mineral supplements as  recommended by your health care provider. What happens during an annual well check? The services and screenings done by your health care provider during your annual well check will depend on your age, overall health, lifestyle risk factors, and family history of disease. Counseling  Your health care provider may ask you questions about your: Alcohol use. Tobacco use. Drug use. Emotional well-being. Home and relationship well-being. Sexual activity. Eating habits. Work and work Statistician. Method of birth control. Menstrual cycle. Pregnancy history. Screening  You may have the following tests or measurements: Height, weight, and BMI. Blood pressure. Lipid and cholesterol levels. These may be checked every 5 years, or more frequently if you are over 3 years old. Skin check. Lung cancer screening. You may have this screening every year starting at age 1 if you have a 30-pack-year history of smoking and currently smoke or have quit within the past 15 years. Fecal occult blood test (FOBT) of the stool. You may have this test every year starting at age 49. Flexible sigmoidoscopy or colonoscopy. You may have a sigmoidoscopy every 5 years or a colonoscopy every 10 years starting at age 35. Hepatitis C blood test. Hepatitis B blood test. Sexually transmitted disease (STD) testing. Diabetes screening. This is done by checking your blood sugar (glucose) after you have not eaten for a while (fasting). You may have this done every 1-3 years. Mammogram. This may be done every 1-2 years. Talk to your health care provider about when you should start having regular mammograms. This may depend on whether you have a family history of breast cancer. BRCA-related cancer screening. This may be done if you have a family history of breast, ovarian, tubal, or peritoneal cancers.  Pelvic exam and Pap test. This may be done every 3 years starting at age 61. Starting at age 35, this may be done every 5 years if  you have a Pap test in combination with an HPV test. Bone density scan. This is done to screen for osteoporosis. You may have this scan if you are at high risk for osteoporosis. Discuss your test results, treatment options, and if necessary, the need for more tests with your health care provider. Vaccines  Your health care provider may recommend certain vaccines, such as: Influenza vaccine. This is recommended every year. Tetanus, diphtheria, and acellular pertussis (Tdap, Td) vaccine. You may need a Td booster every 10 years. Zoster vaccine. You may need this after age 73. Pneumococcal 13-valent conjugate (PCV13) vaccine. You may need this if you have certain conditions and were not previously vaccinated. Pneumococcal polysaccharide (PPSV23) vaccine. You may need one or two doses if you smoke cigarettes or if you have certain conditions. Talk to your health care provider about which screenings and vaccines you need and how often you need them. This information is not intended to replace advice given to you by your health care provider. Make sure you discuss any questions you have with your health care provider. Document Released: 10/14/2015 Document Revised: 06/06/2016 Document Reviewed: 07/19/2015 Elsevier Interactive Patient Education  2017 [employer] Prevention in the Home Falls can cause injuries. They can happen to people of all ages. There are many things you can do to make your home safe and to help prevent falls. What can I do on the outside of my home? Regularly fix the edges of walkways and driveways and fix any cracks. Remove anything that might make you trip as you walk through a door, such as a raised step or threshold. Trim any bushes or trees on the path to your home. Use bright outdoor lighting. Clear any walking paths of anything that might make someone trip, such as rocks or tools. Regularly check to see if handrails are loose or broken. Make sure that both  sides of any steps have handrails. Any raised decks and porches should have guardrails on the edges. Have any leaves, snow, or ice cleared regularly. Use sand or salt on walking paths during winter. Clean up any spills in your garage right away. This includes oil or grease spills. What can I do in the bathroom? Use night lights. Install grab bars by the toilet and in the tub and shower. Do not use towel bars as grab bars. Use non-skid mats or decals in the tub or shower. If you need to sit down in the shower, use a plastic, non-slip stool. Keep the floor dry. Clean up any water that spills on the floor as soon as it happens. Remove soap buildup in the tub or shower regularly. Attach bath mats securely with double-sided non-slip rug tape. Do not have throw rugs and other things on the floor that can make you trip. What can I do in the bedroom? Use night lights. Make sure that you have a light by your bed that is easy to reach. Do not use any sheets or blankets that are too big for your bed. They should not hang down onto the floor. Have a firm chair that has side arms. You can use this for support while you get dressed. Do not have throw rugs and other things on the floor that can make you trip. What can I do in the kitchen?  Clean up any spills right away. Avoid walking on wet floors. Keep items that you use a lot in easy-to-reach places. If you need to reach something above you, use a strong step stool that has a grab bar. Keep electrical cords out of the way. Do not use floor polish or wax that makes floors slippery. If you must use wax, use non-skid floor wax. Do not have throw rugs and other things on the floor that can make you trip. What can I do with my stairs? Do not leave any items on the stairs. Make sure that there are handrails on both sides of the stairs and use them. Fix handrails that are broken or loose. Make sure that handrails are as long as the stairways. Check any  carpeting to make sure that it is firmly attached to the stairs. Fix any carpet that is loose or worn. Avoid having throw rugs at the top or bottom of the stairs. If you do have throw rugs, attach them to the floor with carpet tape. Make sure that you have a light switch at the top of the stairs and the bottom of the stairs. If you do not have them, ask someone to add them for you. What else can I do to help prevent falls? Wear shoes that: Do not have high heels. Have rubber bottoms. Are comfortable and fit you well. Are closed at the toe. Do not wear sandals. If you use a stepladder: Make sure that it is fully opened. Do not climb a closed stepladder. Make sure that both sides of the stepladder are locked into place. Ask someone to hold it for you, if possible. Clearly mark and make sure that you can see: Any grab bars or handrails. First and last steps. Where the edge of each step is. Use tools that help you move around (mobility aids) if they are needed. These include: Canes. Walkers. Scooters. Crutches. Turn on the lights when you go into a dark area. Replace any light bulbs as soon as they burn out. Set up your furniture so you have a clear path. Avoid moving your furniture around. If any of your floors are uneven, fix them. If there are any pets around you, be aware of where they are. Review your medicines with your doctor. Some medicines can make you feel dizzy. This can increase your chance of falling. Ask your doctor what other things that you can do to help prevent falls. This information is not intended to replace advice given to you by your health care provider. Make sure you discuss any questions you have with your health care provider. Document Released: 07/14/2009 Document Revised: 02/23/2016 Document Reviewed: 10/22/2014 Elsevier Interactive Patient Education  2017 [employer].

## 2022-05-09 NOTE — Telephone Encounter (Signed)
Can you refer to [facility] for counseling - her PHQ-9 score was 20 - she feels very down and depressed lately

## 2022-05-09 NOTE — Telephone Encounter (Signed)
Attempted to contact patient - NA °

## 2022-05-23 NOTE — Telephone Encounter (Signed)
Attempted to contact patient - NA °

## 2022-05-24 DIAGNOSIS — M25562 Pain in left knee: Secondary | ICD-10-CM | POA: Diagnosis not present

## 2022-05-28 DIAGNOSIS — M25562 Pain in left knee: Secondary | ICD-10-CM | POA: Diagnosis not present

## 2022-05-28 NOTE — Telephone Encounter (Signed)
NA   Several attempts to patient with no call back  This encounter will be closed

## 2022-06-21 DIAGNOSIS — M170 Bilateral primary osteoarthritis of knee: Secondary | ICD-10-CM | POA: Diagnosis not present

## 2022-06-23 DIAGNOSIS — M1712 Unilateral primary osteoarthritis, left knee: Secondary | ICD-10-CM | POA: Insufficient documentation

## 2022-06-27 DIAGNOSIS — H2513 Age-related nuclear cataract, bilateral: Secondary | ICD-10-CM | POA: Diagnosis not present

## 2022-06-27 DIAGNOSIS — H524 Presbyopia: Secondary | ICD-10-CM | POA: Diagnosis not present

## 2022-06-27 DIAGNOSIS — H5203 Hypermetropia, bilateral: Secondary | ICD-10-CM | POA: Diagnosis not present

## 2022-06-27 DIAGNOSIS — H47323 Drusen of optic disc, bilateral: Secondary | ICD-10-CM | POA: Diagnosis not present

## 2022-06-27 DIAGNOSIS — H02885 Meibomian gland dysfunction left lower eyelid: Secondary | ICD-10-CM | POA: Diagnosis not present

## 2022-06-27 DIAGNOSIS — H02882 Meibomian gland dysfunction right lower eyelid: Secondary | ICD-10-CM | POA: Diagnosis not present

## 2022-07-02 DIAGNOSIS — H539 Unspecified visual disturbance: Secondary | ICD-10-CM | POA: Diagnosis not present

## 2022-07-03 ENCOUNTER — Ambulatory Visit (INDEPENDENT_AMBULATORY_CARE_PROVIDER_SITE_OTHER): Payer: Medicare HMO | Admitting: Family Medicine

## 2022-07-03 ENCOUNTER — Encounter: Payer: Self-pay | Admitting: Family Medicine

## 2022-07-03 VITALS — BP 134/81 | HR 60 | Temp 98.2°F | Ht 61.0 in | Wt 161.0 lb

## 2022-07-03 DIAGNOSIS — Z0001 Encounter for general adult medical examination with abnormal findings: Secondary | ICD-10-CM | POA: Diagnosis not present

## 2022-07-03 DIAGNOSIS — Z1231 Encounter for screening mammogram for malignant neoplasm of breast: Secondary | ICD-10-CM

## 2022-07-03 DIAGNOSIS — Z23 Encounter for immunization: Secondary | ICD-10-CM | POA: Diagnosis not present

## 2022-07-03 DIAGNOSIS — Z87898 Personal history of other specified conditions: Secondary | ICD-10-CM | POA: Diagnosis not present

## 2022-07-03 DIAGNOSIS — E559 Vitamin D deficiency, unspecified: Secondary | ICD-10-CM

## 2022-07-03 DIAGNOSIS — R6889 Other general symptoms and signs: Secondary | ICD-10-CM | POA: Diagnosis not present

## 2022-07-03 DIAGNOSIS — E538 Deficiency of other specified B group vitamins: Secondary | ICD-10-CM | POA: Diagnosis not present

## 2022-07-03 DIAGNOSIS — Z0100 Encounter for examination of eyes and vision without abnormal findings: Secondary | ICD-10-CM | POA: Diagnosis not present

## 2022-07-03 DIAGNOSIS — F419 Anxiety disorder, unspecified: Secondary | ICD-10-CM

## 2022-07-03 DIAGNOSIS — E782 Mixed hyperlipidemia: Secondary | ICD-10-CM | POA: Diagnosis not present

## 2022-07-03 DIAGNOSIS — Z0000 Encounter for general adult medical examination without abnormal findings: Secondary | ICD-10-CM

## 2022-07-03 DIAGNOSIS — K219 Gastro-esophageal reflux disease without esophagitis: Secondary | ICD-10-CM

## 2022-07-03 DIAGNOSIS — R739 Hyperglycemia, unspecified: Secondary | ICD-10-CM | POA: Diagnosis not present

## 2022-07-03 NOTE — Progress Notes (Signed)
Subjective:  Patient ID: Julia [name], female    DOB: 1961/02/22, 61 y.o.   MRN: [id number]  Patient Care Team: Loman Brooklyn, FNP as PCP - General (Family Medicine) Fransico Michael (Psychiatry) Atilano Ina, MD as Referring Physician (Neurosurgery) Hennie Duos, MD as Consulting Physician (Rheumatology) Nahser, Wonda Cheng, MD as Consulting Physician (Cardiology) Margaretha Seeds, MD as Consulting Physician (Pulmonary Disease) Harlen Labs, MD as Referring Physician (Optometry)   Chief Complaint:  Annual Exam   HPI: Julia [name] is a 61 y.o. female presenting on 07/03/2022 for Annual Exam   Pt presents today for her annual physical exam. She states she is doing well overall. She does have chronic arthralgias for which she sees ortho. She recently received steroid joint injections in her knee with minimal relief of pain. Has follow up for reevaluation scheduled. She has anxiety and depression which is fairly controlled by her current medications. Would like to see counselor again as she has not been in a while. Has been on PPI therapy for GERD for several years, has not tried tapering off of medications. Denies esophagitis symptoms. She is compliant with her medications without associated side effects. She is due for mammogram, all other health maintenance is up to date.       07/03/2022    9:11 AM 05/09/2022    9:07 AM 12/28/2021    8:03 AM 06/22/2021    8:47 AM 05/08/2021    9:12 AM  Depression screen PHQ 2/9  Decreased Interest 3 3 2 2 2   Down, Depressed, Hopeless 3 3 2 2 2   PHQ - 2 Score 6 6 4 4 4   Altered sleeping 3 3 3 3 2   Tired, decreased energy 3 3 3 3 2   Change in appetite 2 3 2 2 2   Feeling bad or failure about yourself  2 2 1 2 1   Trouble concentrating 2 2 1 2 2   Moving slowly or fidgety/restless 2 0 1 1 0  Suicidal thoughts 0 1 1 0 0  PHQ-9 Score 20 20 16 17 13   Difficult doing work/chores Extremely dIfficult Very difficult Very difficult  Somewhat difficult Somewhat difficult      07/03/2022    9:12 AM 12/28/2021    8:03 AM 06/22/2021    8:47 AM 02/12/2021    9:20 AM  GAD 7 : Generalized Anxiety Score  Nervous, Anxious, on Edge 3 2 3 2   Control/stop worrying 3 3 3 2   Worry too much - different things 3 3 3 3   Trouble relaxing 2 2 2 2   Restless 2 1 1 1   Easily annoyed or irritable 2 2 1 2   Afraid - awful might happen 2 3 2 2   Total GAD 7 Score 17 16 15 14   Anxiety Difficulty Very difficult Very difficult Very difficult Very difficult      Relevant past medical, surgical, family, and social history reviewed and updated as indicated.  Allergies and medications reviewed and updated. Data reviewed: Chart in Epic.   Past Medical History:  Diagnosis Date   Anxiety    Arthritis    Complication of anesthesia    slow to wake up-does not take much medicine to sedate her   Depression    Esophageal reflux    Hyperlipidemia    Low ferritin 02/10/2021   Paget disease of bone    Pneumonia due to COVID-19 virus 05/20/2020   Vitamin B12 deficiency 02/10/2021   Vitamin D  insufficiency 02/10/2021   Wears glasses     Past Surgical History:  Procedure Laterality Date   ANTERIOR FUSION CERVICAL SPINE  10/2019   C3-C5   CERVICAL SPINE SURGERY  10/2011   CESAREAN SECTION     DILATION AND CURETTAGE OF UTERUS  1986   KNEE ARTHROSCOPY Left 07/22/2013   Procedure: LEFT KNEE ARTHROSCOPY WITH DEBRIDEMENT CHONDROPLASTY, REMOVAL LOOSE BODIES, PARTIAL MEDIAL MENISCECTOMY, OPEN EXCISION OF PES GANGLION;  Surgeon: Kerin Salen, MD;  Location: [facility];  Service: Orthopedics;  Laterality: Left;  NO SPECIMEN SENT PER DR. Mayer Camel ORDER.   KNEE SURGERY Left 11/21/2021   SPINE SURGERY  10/26/2019   TOTAL ABDOMINAL HYSTERECTOMY  05/2010   TAH   TUBAL LIGATION  1989    Social History   Socioeconomic History   Marital status: Significant Other    Spouse name: Iona Hansen   Number of children: 3   Years of education:  Not on file   Highest education level: Not on file  Occupational History   Occupation: disability  Tobacco Use   Smoking status: Former    Packs/day: 0.15    Years: 5.00    Total pack years: 0.75    Types: Cigarettes    Quit date: 01/02/1985    Years since quitting: 37.5   Smokeless tobacco: Never   Tobacco comments:    1 pack per week per pt.   Vaping Use   Vaping Use: Never used  Substance and Sexual Activity   Alcohol use: Yes    Alcohol/week: 1.0 standard drink of alcohol    Types: 1 Glasses of wine per week    Comment: occassional   Drug use: No   Sexual activity: Not Currently  Other Topics Concern   Not on file  Social History Narrative   2 sons live nearby, one son in [place]    05/09/2022 - Has 3 grandchildren - one almost a year old   Social Determinants of Health   Financial Resource Strain: Low Risk  (05/09/2022)   Overall Financial Resource Strain (CARDIA)    Difficulty of Paying Living Expenses: Not very hard  Food Insecurity: No Food Insecurity (05/09/2022)   Hunger Vital Sign    Worried About Running Out of Food in the Last Year: Never true    Ran Out of Food in the Last Year: Never true  Transportation Needs: No Transportation Needs (05/09/2022)   PRAPARE - Hydrologist (Medical): No    Lack of Transportation (Non-Medical): No  Physical Activity: Insufficiently Active (05/09/2022)   Exercise Vital Sign    Days of Exercise per Week: 7 days    Minutes of Exercise per Session: 10 min  Stress: Stress Concern Present (05/09/2022)   [place]    Feeling of Stress : Rather much  Social Connections: Socially Integrated (05/09/2022)   Social Connection and Isolation Panel [NHANES]    Frequency of Communication with Friends and Family: More than three times a week    Frequency of Social Gatherings with Friends and Family: Once a week    Attends Religious Services: 1 to 4 times  per year    Active Member of [employer] or Organizations: Yes    Attends Archivist Meetings: 1 to 4 times per year    Marital Status: Living with partner  Intimate Partner Violence: Not At Risk (05/09/2022)   Humiliation, Afraid, Rape, and Kick questionnaire  Fear of Current or Ex-Partner: No    Emotionally Abused: No    Physically Abused: No    Sexually Abused: No    Outpatient Encounter Medications as of 07/03/2022  Medication Sig   atorvastatin (LIPITOR) 40 MG tablet Take 1 tablet (40 mg total) by mouth every evening.   buPROPion (WELLBUTRIN XL) 300 MG 24 hr tablet Take 1 tablet (300 mg total) by mouth daily.   Cholecalciferol (VITAMIN D3) 50 MCG (2000 UT) capsule Take 2,000 Units by mouth daily.   gabapentin (NEURONTIN) 300 MG capsule Take 1 capsule (300 mg total) by mouth in the morning AND 2 capsules (600 mg total) at bedtime.   meloxicam (MOBIC) 15 MG tablet TAKE ONE TABLET ONCE DAILY   methocarbamol (ROBAXIN) 500 MG tablet Take 1 tablet (500 mg total) by mouth every 8 (eight) hours as needed for muscle spasms.   omeprazole (PRILOSEC) 40 MG capsule TAKE 1 CAPSULE DAILY   Probiotic Product (PROBIOTIC DAILY PO) Take 1 tablet by mouth daily.   sertraline (ZOLOFT) 100 MG tablet Take 2 tablets (200 mg total) by mouth daily.   [DISCONTINUED] ferrous sulfate 325 (65 FE) MG tablet Take 325 mg by mouth daily. (Patient not taking: Reported on 07/03/2022)   [DISCONTINUED] vitamin B-12 (CYANOCOBALAMIN) 1000 MCG tablet Take 1,000 mcg by mouth daily. (Patient not taking: Reported on 07/03/2022)   No facility-administered encounter medications on file as of 07/03/2022.    Allergies  Allergen Reactions   Lamotrigine Itching and Swelling    Review of Systems  Constitutional:  Positive for activity change, appetite change and fatigue. Negative for chills, diaphoresis, fever and unexpected weight change.  HENT: Negative.    Eyes: Negative.  Negative for photophobia.  Respiratory:   Negative for cough, chest tightness and shortness of breath.   Cardiovascular:  Negative for chest pain, palpitations and leg swelling.  Gastrointestinal:  Negative for abdominal pain, blood in stool, constipation, diarrhea, nausea and vomiting.  Endocrine: Negative.  Negative for cold intolerance, heat intolerance, polydipsia, polyphagia and polyuria.  Genitourinary:  Negative for decreased urine volume, difficulty urinating, dysuria, frequency and urgency.  Musculoskeletal:  Positive for arthralgias and myalgias.  Skin: Negative.   Allergic/Immunologic: Negative.   Neurological:  Negative for dizziness, tremors, seizures, syncope, facial asymmetry, speech difficulty, weakness, light-headedness, numbness and headaches.  Hematological: Negative.   Psychiatric/Behavioral:  Positive for agitation, decreased concentration and sleep disturbance. Negative for behavioral problems, confusion, dysphoric mood, hallucinations, self-injury and suicidal ideas. The patient is nervous/anxious. The patient is not hyperactive.   All other systems reviewed and are negative.       Objective:  BP 134/81   Pulse 60   Temp 98.2 F (36.8 C)   Ht 5' 1"  (1.549 m)   Wt 161 lb (73 kg)   LMP 01/02/2010   SpO2 98%   BMI 30.42 kg/m    Wt Readings from Last 3 Encounters:  07/03/22 161 lb (73 kg)  05/09/22 165 lb (74.8 kg)  01/25/22 165 lb 3.2 oz (74.9 kg)    Physical Exam Vitals and nursing note reviewed.  Constitutional:      General: She is not in acute distress.    Appearance: Normal appearance. She is well-developed and well-groomed. She is obese. She is not ill-appearing, toxic-appearing or diaphoretic.  HENT:     Head: Normocephalic and atraumatic.     Jaw: There is normal jaw occlusion.     Right Ear: Hearing, tympanic membrane, ear canal and external ear normal.  Left Ear: Hearing, tympanic membrane, ear canal and external ear normal.     Nose: Nose normal.     Mouth/Throat:     Lips:  Pink.     Mouth: Mucous membranes are moist.     Pharynx: Oropharynx is clear. Uvula midline.  Eyes:     General: Lids are normal.     Extraocular Movements: Extraocular movements intact.     Conjunctiva/sclera: Conjunctivae normal.     Pupils: Pupils are equal, round, and reactive to light.  Neck:     Thyroid: No thyroid mass, thyromegaly or thyroid tenderness.     Vascular: No carotid bruit or JVD.     Trachea: Trachea and phonation normal.  Cardiovascular:     Rate and Rhythm: Normal rate and regular rhythm.     Chest Wall: PMI is not displaced.     Pulses: Normal pulses.     Heart sounds: Normal heart sounds. No murmur heard.    No friction rub. No gallop.  Pulmonary:     Effort: Pulmonary effort is normal. No respiratory distress.     Breath sounds: Normal breath sounds. No wheezing.  Abdominal:     General: Bowel sounds are normal. There is no distension or abdominal bruit.     Palpations: Abdomen is soft. There is no hepatomegaly or splenomegaly.     Tenderness: There is no abdominal tenderness. There is no right CVA tenderness or left CVA tenderness.     Hernia: No hernia is present.  Musculoskeletal:        General: Normal range of motion.     Cervical back: Normal range of motion and neck supple.     Right lower leg: No edema.     Left lower leg: No edema.  Lymphadenopathy:     Cervical: No cervical adenopathy.  Skin:    General: Skin is warm and dry.     Capillary Refill: Capillary refill takes less than 2 seconds.     Coloration: Skin is not cyanotic, jaundiced or pale.     Findings: No rash.  Neurological:     General: No focal deficit present.     Mental Status: She is alert and oriented to person, place, and time.     Sensory: Sensation is intact.     Motor: Motor function is intact.     Coordination: Coordination is intact.     Gait: Gait is intact.     Deep Tendon Reflexes: Reflexes are normal and symmetric.  Psychiatric:        Attention and  Perception: Attention and perception normal.        Mood and Affect: Mood and affect normal.        Speech: Speech normal.        Behavior: Behavior normal. Behavior is cooperative.        Thought Content: Thought content normal.        Cognition and Memory: Cognition and memory normal.        Judgment: Judgment normal.     Results for orders placed or performed in visit on 12/28/21  Lipid panel  Result Value Ref Range   Cholesterol, Total 178 100 - 199 mg/dL   Triglycerides 60 0 - 149 mg/dL   HDL 69 >39 mg/dL   VLDL Cholesterol Cal 12 5 - 40 mg/dL   LDL Chol Calc (NIH) 97 0 - 99 mg/dL   Chol/HDL Ratio 2.6 0.0 - 4.4 ratio  CBC with Differential/Platelet  Result Value  Ref Range   WBC 6.6 3.4 - 10.8 x10E3/uL   RBC 4.63 3.77 - 5.28 x10E6/uL   Hemoglobin 14.3 11.1 - 15.9 g/dL   Hematocrit 43.6 34.0 - 46.6 %   MCV 94 79 - 97 fL   MCH 30.9 26.6 - 33.0 pg   MCHC 32.8 31.5 - 35.7 g/dL   RDW 12.7 11.7 - 15.4 %   Platelets 227 150 - 450 x10E3/uL   Neutrophils 69 Not Estab. %   Lymphs 21 Not Estab. %   Monocytes 7 Not Estab. %   Eos 2 Not Estab. %   Basos 1 Not Estab. %   Neutrophils Absolute 4.6 1.4 - 7.0 x10E3/uL   Lymphocytes Absolute 1.4 0.7 - 3.1 x10E3/uL   Monocytes Absolute 0.5 0.1 - 0.9 x10E3/uL   EOS (ABSOLUTE) 0.1 0.0 - 0.4 x10E3/uL   Basophils Absolute 0.0 0.0 - 0.2 x10E3/uL   Immature Granulocytes 0 Not Estab. %   Immature Grans (Abs) 0.0 0.0 - 0.1 x10E3/uL  CMP14+EGFR  Result Value Ref Range   Glucose 102 (H) 70 - 99 mg/dL   BUN 22 8 - 27 mg/dL   Creatinine, Ser 1.05 (H) 0.57 - 1.00 mg/dL   eGFR 61 >59 mL/min/1.73   BUN/Creatinine Ratio 21 12 - 28   Sodium 141 134 - 144 mmol/L   Potassium 4.9 3.5 - 5.2 mmol/L   Chloride 106 96 - 106 mmol/L   CO2 25 20 - 29 mmol/L   Calcium 9.5 8.7 - 10.3 mg/dL   Total Protein 6.1 6.0 - 8.5 g/dL   Albumin 4.2 3.8 - 4.9 g/dL   Globulin, Total 1.9 1.5 - 4.5 g/dL   Albumin/Globulin Ratio 2.2 1.2 - 2.2   Bilirubin Total 0.4 0.0  - 1.2 mg/dL   Alkaline Phosphatase 77 44 - 121 IU/L   AST 17 0 - 40 IU/L   ALT 17 0 - 32 IU/L  Bayer DCA Hb A1c Waived  Result Value Ref Range   HB A1C (BAYER DCA - WAIVED) 5.5 4.8 - 5.6 %  VITAMIN D 25 Hydroxy (Vit-D Deficiency, Fractures)  Result Value Ref Range   Vit D, 25-Hydroxy 29.4 (L) 30.0 - 100.0 ng/mL  Ferritin  Result Value Ref Range   Ferritin 66 15 - 150 ng/mL  Specimen status report  Result Value Ref Range   specimen status report Comment        Pertinent labs & imaging results that were available during my care of the patient were reviewed by me and considered in my medical decision making.  Assessment & Plan:  Keziah was seen today for annual exam.  Diagnoses and all orders for this visit:  Annual physical exam Health maintenance discussed in detail and updated today. Diet and exercise encouraged.  -     Anemia Profile B -     CMP14+EGFR -     Lipid panel -     Vitamin B12 -     VITAMIN D 25 Hydroxy (Vit-D Deficiency, Fractures) -     Thyroid Panel With TSH -     MM 3D SCREEN BREAST BILATERAL  Encounter for screening mammogram for malignant neoplasm of breast -     MM 3D SCREEN BREAST BILATERAL  Need for immunization against influenza Given today.   Mixed hyperlipidemia Diet encouraged - increase intake of fresh fruits and vegetables, increase intake of lean proteins. Bake, broil, or grill foods. Avoid fried, greasy, and fatty foods. Avoid fast foods. Increase intake of fiber-rich  whole grains. Exercise encouraged - at least 150 minutes per week and advance as tolerated.  Goal BMI < 25. Continue medications as prescribed. Follow up in 3-6 months as discussed.  -     Lipid panel  Vitamin D insufficiency Labs pending. Continue repletion therapy. If indicated, will change repletion dosage. Eat foods rich in Vit D including milk, orange juice, yogurt with vitamin D added, salmon or mackerel, canned tuna fish, cereals with vitamin D added, and cod liver oil.  Get out in the sun but make sure to wear at least SPF 30 sunscreen.  -     VITAMIN D 25 Hydroxy (Vit-D Deficiency, Fractures)  Vitamin B12 deficiency Currently not on repletion therapy. Will check labs today and restart if warranted.  -     Vitamin B12  Gastroesophageal reflux disease without esophagitis No red flags present. Will taper off of PPI therapy and see if pt remains symptom free. If not, will start Pepcid.  -     Anemia Profile B  History of prediabetes Will check labs today, diet and exercise encouraged.  -     Hemoglobin A1c  Anxiety and depression Referral to counselor placed today.     Continue all other maintenance medications.  Follow up plan: Return in about 1 year (around 07/04/2023), or if symptoms worsen or fail to improve, for CPE.   Continue healthy lifestyle choices, including diet (rich in fruits, vegetables, and lean proteins, and low in salt and simple carbohydrates) and exercise (at least 30 minutes of moderate physical activity daily).  Educational handout given for health maintenance  The above assessment and management plan was discussed with the patient. The patient verbalized understanding of and has agreed to the management plan. Patient is aware to call the clinic if they develop any new symptoms or if symptoms persist or worsen. Patient is aware when to return to the clinic for a follow-up visit. Patient educated on when it is appropriate to go to the emergency department.   Monia Pouch, FNP-C [facility] [phone]

## 2022-07-04 ENCOUNTER — Other Ambulatory Visit: Payer: Self-pay | Admitting: Family Medicine

## 2022-07-04 DIAGNOSIS — F419 Anxiety disorder, unspecified: Secondary | ICD-10-CM

## 2022-07-04 LAB — SPECIMEN STATUS REPORT

## 2022-07-04 LAB — ANEMIA PROFILE B
Basophils Absolute: 0 10*3/uL (ref 0.0–0.2)
Basos: 0 %
EOS (ABSOLUTE): 0.1 10*3/uL (ref 0.0–0.4)
Eos: 2 %
Ferritin: 37 ng/mL (ref 15–150)
Folate: 5.6 ng/mL (ref >3.0)
Hematocrit: 40.8 % (ref 34.0–46.6)
Hemoglobin: 13.7 g/dL (ref 11.1–15.9)
Immature Grans (Abs): 0 10*3/uL (ref 0.0–0.1)
Immature Granulocytes: 0 %
Iron Saturation: 39 % (ref 15–55)
Iron: 103 ug/dL (ref 27–159)
Lymphocytes Absolute: 1.1 10*3/uL (ref 0.7–3.1)
Lymphs: 16 %
MCH: 31.6 pg (ref 26.6–33.0)
MCHC: 33.6 g/dL (ref 31.5–35.7)
MCV: 94 fL (ref 79–97)
Monocytes Absolute: 0.4 10*3/uL (ref 0.1–0.9)
Monocytes: 6 %
Neutrophils Absolute: 5.3 10*3/uL (ref 1.4–7.0)
Neutrophils: 76 %
Platelets: 231 10*3/uL (ref 150–450)
RBC: 4.34 x10E6/uL (ref 3.77–5.28)
RDW: 13.1 % (ref 11.7–15.4)
Retic Ct Pct: 1.6 % (ref 0.6–2.6)
Total Iron Binding Capacity: 264 ug/dL (ref 250–450)
UIBC: 161 ug/dL (ref 131–425)
Vitamin B-12: 278 pg/mL (ref 232–1245)
WBC: 7 10*3/uL (ref 3.4–10.8)

## 2022-07-04 LAB — CMP14+EGFR
ALT: 11 IU/L (ref 0–32)
AST: 16 IU/L (ref 0–40)
Albumin/Globulin Ratio: 2.2 (ref 1.2–2.2)
Albumin: 4.3 g/dL (ref 3.8–4.9)
Alkaline Phosphatase: 73 IU/L (ref 44–121)
BUN/Creatinine Ratio: 19 (ref 12–28)
BUN: 18 mg/dL (ref 8–27)
Bilirubin Total: 0.4 mg/dL (ref 0.0–1.2)
CO2: 23 mmol/L (ref 20–29)
Calcium: 9.3 mg/dL (ref 8.7–10.3)
Chloride: 107 mmol/L — ABNORMAL HIGH (ref 96–106)
Creatinine, Ser: 0.93 mg/dL (ref 0.57–1.00)
Globulin, Total: 2 g/dL (ref 1.5–4.5)
Glucose: 98 mg/dL (ref 70–99)
Potassium: 4.3 mmol/L (ref 3.5–5.2)
Sodium: 144 mmol/L (ref 134–144)
Total Protein: 6.3 g/dL (ref 6.0–8.5)
eGFR: 70 mL/min/{1.73_m2} (ref >59)

## 2022-07-04 LAB — HGB A1C W/O EAG: Hgb A1c MFr Bld: 5.8 % — ABNORMAL HIGH (ref 4.8–5.6)

## 2022-07-04 LAB — LIPID PANEL
Chol/HDL Ratio: 2.1 ratio (ref 0.0–4.4)
Cholesterol, Total: 158 mg/dL (ref 100–199)
HDL: 75 mg/dL (ref >39)
LDL Chol Calc (NIH): 69 mg/dL (ref 0–99)
Triglycerides: 71 mg/dL (ref 0–149)
VLDL Cholesterol Cal: 14 mg/dL (ref 5–40)

## 2022-07-04 LAB — THYROID PANEL WITH TSH
Free Thyroxine Index: 1.7 (ref 1.2–4.9)
T3 Uptake Ratio: 28 % (ref 24–39)
T4, Total: 6.2 ug/dL (ref 4.5–12.0)
TSH: 2.91 u[IU]/mL (ref 0.450–4.500)

## 2022-07-04 LAB — VITAMIN D 25 HYDROXY (VIT D DEFICIENCY, FRACTURES): Vit D, 25-Hydroxy: 38.1 ng/mL (ref 30.0–100.0)

## 2022-07-26 DIAGNOSIS — M1712 Unilateral primary osteoarthritis, left knee: Secondary | ICD-10-CM | POA: Diagnosis not present

## 2022-07-30 ENCOUNTER — Ambulatory Visit
Admission: RE | Admit: 2022-07-30 | Discharge: 2022-07-30 | Disposition: A | Discharge status: Home / Self Care | Payer: Medicare HMO | Source: Ambulatory Visit | Attending: Family Medicine | Admitting: Family Medicine

## 2022-07-30 DIAGNOSIS — Z1231 Encounter for screening mammogram for malignant neoplasm of breast: Secondary | ICD-10-CM | POA: Diagnosis not present

## 2022-08-02 ENCOUNTER — Encounter (HOSPITAL_COMMUNITY): Payer: Self-pay | Admitting: Psychiatry

## 2022-08-02 ENCOUNTER — Ambulatory Visit (HOSPITAL_BASED_OUTPATIENT_CLINIC_OR_DEPARTMENT_OTHER): Payer: Medicare HMO | Admitting: Psychiatry

## 2022-08-02 DIAGNOSIS — F331 Major depressive disorder, recurrent, moderate: Secondary | ICD-10-CM

## 2022-08-02 DIAGNOSIS — F411 Generalized anxiety disorder: Secondary | ICD-10-CM

## 2022-08-02 DIAGNOSIS — R69 Illness, unspecified: Secondary | ICD-10-CM | POA: Diagnosis not present

## 2022-08-02 DIAGNOSIS — E559 Vitamin D deficiency, unspecified: Secondary | ICD-10-CM

## 2022-08-02 DIAGNOSIS — F339 Major depressive disorder, recurrent, unspecified: Secondary | ICD-10-CM | POA: Insufficient documentation

## 2022-08-02 MED ORDER — DULOXETINE HCL 30 MG PO CPEP: ORAL_CAPSULE | ORAL | 0 refills | Status: DC
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
## 2022-08-22 ENCOUNTER — Other Ambulatory Visit: Payer: Self-pay | Admitting: Family Medicine

## 2022-08-22 DIAGNOSIS — M1990 Unspecified osteoarthritis, unspecified site: Secondary | ICD-10-CM

## 2022-09-04 ENCOUNTER — Telehealth (HOSPITAL_COMMUNITY): Payer: Self-pay | Admitting: *Deleted

## 2022-09-04 ENCOUNTER — Telehealth (HOSPITAL_COMMUNITY): Payer: Medicare HMO | Admitting: Psychiatry

## 2022-09-04 DIAGNOSIS — F331 Major depressive disorder, recurrent, moderate: Secondary | ICD-10-CM

## 2022-09-04 DIAGNOSIS — F411 Generalized anxiety disorder: Secondary | ICD-10-CM

## 2022-09-04 MED ORDER — DULOXETINE HCL 60 MG PO CPEP: 60.0000 mg | ORAL_CAPSULE | Freq: Every day | ORAL | 0 refills | Status: DC

## 2022-09-04 NOTE — Telephone Encounter (Signed)
Pt of Dr. Mercy Riding who has been scheduled to 11/01/22 from 09/04/22, called to request refills. Pt is taking Wellbutrin XL 300mg  qd, Cymbalta 60 mg qd, and Zoloft 100 mg. Please review and advise.

## 2022-09-04 NOTE — Telephone Encounter (Signed)
Her Zoloft and Wellbutrin is given by PCP.  Zoloft should be titrating down and Dr. Mercy Riding started Cymbalta with upward dose titration.  She was given Cymbalta 30 mg for 1 week and then 2 times daily which should last until first week of January.  A new prescription 60 mg daily sent to the pharmacy after she finished with the recent prescription.

## 2022-09-10 ENCOUNTER — Other Ambulatory Visit: Payer: Self-pay | Admitting: Family Medicine

## 2022-09-10 DIAGNOSIS — G629 Polyneuropathy, unspecified: Secondary | ICD-10-CM

## 2022-10-24 ENCOUNTER — Other Ambulatory Visit: Payer: Self-pay | Admitting: Family Medicine

## 2022-10-24 DIAGNOSIS — E782 Mixed hyperlipidemia: Secondary | ICD-10-CM

## 2022-11-01 ENCOUNTER — Telehealth (HOSPITAL_COMMUNITY): Payer: Medicare HMO | Admitting: Psychiatry

## 2022-11-01 ENCOUNTER — Encounter (HOSPITAL_COMMUNITY): Payer: Self-pay | Admitting: Psychiatry

## 2022-11-01 DIAGNOSIS — F411 Generalized anxiety disorder: Secondary | ICD-10-CM

## 2022-11-01 DIAGNOSIS — E559 Vitamin D deficiency, unspecified: Secondary | ICD-10-CM | POA: Diagnosis not present

## 2022-11-01 DIAGNOSIS — F331 Major depressive disorder, recurrent, moderate: Secondary | ICD-10-CM

## 2022-11-01 DIAGNOSIS — R69 Illness, unspecified: Secondary | ICD-10-CM | POA: Diagnosis not present

## 2022-11-01 MED ORDER — TRAZODONE HCL 50 MG PO TABS: 50.0000 mg | ORAL_TABLET | Freq: Every day | ORAL | 1 refills | Status: DC

## 2022-11-01 MED ORDER — DULOXETINE HCL 30 MG PO CPEP: 30.0000 mg | ORAL_CAPSULE | Freq: Every evening | ORAL | 0 refills | Status: DC

## 2022-11-01 MED ORDER — DULOXETINE HCL 60 MG PO CPEP: 60.0000 mg | ORAL_CAPSULE | Freq: Two times a day (BID) | ORAL | 0 refills | Status: DC
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
## 2022-11-23 ENCOUNTER — Ambulatory Visit (INDEPENDENT_AMBULATORY_CARE_PROVIDER_SITE_OTHER): Payer: Medicare HMO | Admitting: Clinical

## 2022-11-23 ENCOUNTER — Encounter (HOSPITAL_COMMUNITY): Payer: Self-pay | Admitting: Clinical

## 2022-11-23 ENCOUNTER — Encounter (HOSPITAL_COMMUNITY): Payer: Self-pay

## 2022-11-23 DIAGNOSIS — F411 Generalized anxiety disorder: Secondary | ICD-10-CM | POA: Diagnosis not present

## 2022-11-23 DIAGNOSIS — F331 Major depressive disorder, recurrent, moderate: Secondary | ICD-10-CM | POA: Diagnosis not present

## 2022-11-23 DIAGNOSIS — R69 Illness, unspecified: Secondary | ICD-10-CM | POA: Diagnosis not present
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
## 2022-12-01 ENCOUNTER — Other Ambulatory Visit (HOSPITAL_COMMUNITY): Payer: Self-pay | Admitting: Psychiatry

## 2022-12-01 DIAGNOSIS — F411 Generalized anxiety disorder: Secondary | ICD-10-CM

## 2022-12-01 DIAGNOSIS — F331 Major depressive disorder, recurrent, moderate: Secondary | ICD-10-CM

## 2022-12-05 ENCOUNTER — Other Ambulatory Visit (HOSPITAL_COMMUNITY): Payer: Self-pay | Admitting: Family Medicine

## 2022-12-05 ENCOUNTER — Telehealth (HOSPITAL_COMMUNITY): Payer: Self-pay | Admitting: *Deleted

## 2022-12-05 DIAGNOSIS — F411 Generalized anxiety disorder: Secondary | ICD-10-CM

## 2022-12-05 DIAGNOSIS — F331 Major depressive disorder, recurrent, moderate: Secondary | ICD-10-CM

## 2022-12-05 NOTE — Telephone Encounter (Signed)
Rx Refill Request-- [place], Gustine  DULoxetine (CYMBALTA) 60 MG capsule     Last Filled: 10/06/2022  Last Appt :  11/01/22 Next Appt:   none scheduled

## 2022-12-06 ENCOUNTER — Other Ambulatory Visit (HOSPITAL_COMMUNITY): Payer: Self-pay | Admitting: Psychiatry

## 2022-12-06 DIAGNOSIS — F411 Generalized anxiety disorder: Secondary | ICD-10-CM

## 2022-12-06 DIAGNOSIS — F331 Major depressive disorder, recurrent, moderate: Secondary | ICD-10-CM

## 2022-12-06 MED ORDER — DULOXETINE HCL 60 MG PO CPEP: 60.0000 mg | ORAL_CAPSULE | Freq: Two times a day (BID) | ORAL | 2 refills | Status: DC

## 2022-12-13 ENCOUNTER — Telehealth (HOSPITAL_COMMUNITY): Payer: Medicare HMO | Admitting: Psychiatry

## 2022-12-14 ENCOUNTER — Telehealth (HOSPITAL_COMMUNITY): Payer: Medicare HMO | Admitting: Psychiatry

## 2022-12-14 ENCOUNTER — Encounter (HOSPITAL_COMMUNITY): Payer: Self-pay | Admitting: Psychiatry

## 2022-12-14 DIAGNOSIS — F419 Anxiety disorder, unspecified: Secondary | ICD-10-CM | POA: Diagnosis not present

## 2022-12-14 DIAGNOSIS — E559 Vitamin D deficiency, unspecified: Secondary | ICD-10-CM

## 2022-12-14 DIAGNOSIS — F331 Major depressive disorder, recurrent, moderate: Secondary | ICD-10-CM | POA: Diagnosis not present

## 2022-12-14 DIAGNOSIS — F411 Generalized anxiety disorder: Secondary | ICD-10-CM | POA: Diagnosis not present

## 2022-12-14 MED ORDER — BUPROPION HCL ER (XL) 300 MG PO TB24: 300.0000 mg | ORAL_TABLET | Freq: Every day | ORAL | 1 refills | Status: DC

## 2022-12-14 MED ORDER — DULOXETINE HCL 60 MG PO CPEP: 60.0000 mg | ORAL_CAPSULE | Freq: Two times a day (BID) | ORAL | 0 refills | Status: DC

## 2022-12-14 MED ORDER — TRAZODONE HCL 50 MG PO TABS: 25.0000 mg | ORAL_TABLET | Freq: Every day | ORAL | 1 refills | Status: DC
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
## 2022-12-24 ENCOUNTER — Encounter (HOSPITAL_COMMUNITY): Payer: Self-pay | Admitting: Clinical

## 2022-12-24 ENCOUNTER — Ambulatory Visit (HOSPITAL_COMMUNITY): Payer: Medicare HMO | Admitting: Clinical

## 2022-12-24 DIAGNOSIS — F411 Generalized anxiety disorder: Secondary | ICD-10-CM

## 2022-12-24 DIAGNOSIS — F331 Major depressive disorder, recurrent, moderate: Secondary | ICD-10-CM | POA: Diagnosis not present

## 2022-12-24 DIAGNOSIS — R69 Illness, unspecified: Secondary | ICD-10-CM | POA: Diagnosis not present

## 2022-12-24 NOTE — Progress Notes (Unsigned)
   THERAPIST PROGRESS NOTE  Session Time: 2:05pm-3:02pm  Participation Level: Active  Behavioral Response: Casual Alert Depressed and Worthless  Type of Therapy: Individual Therapy  Treatment Goals addressed: ***  ProgressTowards Goals: {Progress Towards Goals:21014066}  Interventions: {CHL AMB BH Type of Intervention:21022753}  Summary: Julia [name] is a 62 y.o. female who presents with ***.   Suicidal/Homicidal: No  Therapist Response: ***  Recommendations:  Return to therapy in 2 weeks, engage in self care behaviors, ***  Plan: Return again in 2 weeks.  Diagnosis: Moderate episode of recurrent major depressive disorder (HCC)  GAD (generalized anxiety disorder)  Collaboration of Care: {BH OP Collaboration of Care:21014065}  Patient/Guardian was advised Release of Information must be obtained prior to any record release in order to collaborate their care with an outside provider. Patient/Guardian was advised if they have not already done so to contact the registration department to sign all necessary forms in order for [place] to release information regarding their care.   Consent: Patient/Guardian gives verbal consent for treatment and assignment of benefits for services provided during this visit. Patient/Guardian expressed understanding and agreed to proceed.   Maretta Los, LCSW 12/24/2022

## 2023-01-07 ENCOUNTER — Ambulatory Visit (HOSPITAL_COMMUNITY): Payer: Medicare HMO | Admitting: Clinical

## 2023-01-07 ENCOUNTER — Encounter (HOSPITAL_COMMUNITY): Payer: Self-pay | Admitting: Clinical

## 2023-01-07 DIAGNOSIS — F411 Generalized anxiety disorder: Secondary | ICD-10-CM

## 2023-01-07 DIAGNOSIS — F331 Major depressive disorder, recurrent, moderate: Secondary | ICD-10-CM | POA: Diagnosis not present

## 2023-01-07 DIAGNOSIS — R69 Illness, unspecified: Secondary | ICD-10-CM | POA: Diagnosis not present

## 2023-01-07 NOTE — Progress Notes (Signed)
THERAPIST PROGRESS NOTE  Session Time: 2:05pm-3:05pm  Participation Level: Active  Behavioral Response: Casual Alert Depressed, Hopeless, and Irritable  Type of Therapy: Individual Therapy  Treatment Goals addressed:  LTG: Julia [name] will score less than 5 on the Generalized Anxiety Disorder 7 Scale (GAD-7)  STG: Julia [name] will reduce frequency of avoidant behaviors by 50% as evidenced by self-report in therapy sessions  LTG: Reduce frequency, intensity, and duration of depression symptoms so that daily functioning is improved   STG: Julia [name] will score less than 9 on the Patient Health Questionnaire (PHQ-9)    STG: Julia [name] will practice behavioral activation skills 2 times per week for the next 26 weeks  ProgressTowards Goals: Progressing  Interventions: Supportive and Other: Processing  Summary: Julia [name] is a 62 y.o. female who presents with anxiety and depression that feels overwhelming. As had been planned at last session, we started with talking about her mother and the way mother always favored some of the other children, the boys in particular.  She pointed out that 3 of her brothers and both parents were alcoholics.  She related her upbringing to how she decided to parent her children.  She gave a lot of details about past events, is slow and deliberate in her description so much of the session is spent on these stories.  CSW attempted to inquire and ascertain whether this correlates in some way to her negativity about how much her significant other talks on a daily basis to all his children and family members, telling them everything going on with him over and over.  Because everybody in her own family knew about her infraction that upset mother and father so much (owing brother $31), there is some level of disquiet that perhaps her significant other and his family know something about her that she does not know.    She was tearful in talking about her extreme restriction in talking  to her son Molli Hazard since he was trying to get her to visit him, which she declined because it entailed a plane ride she was not sure she could tolerate, but she very soon afterward went to her signficant other's mother's funeral by plane.  Years later, they still barely talk and that is only after she texts him to see if it is okay.  This keeps her from a relationship with her grandchildren by him.  Suicidal/Homicidal: No  Therapist Response:  Patient is making progress in sharing her thoughts and feelings, getting them out although they do seem to be a pattern that she does not quite recognize.  It seems important to start to move her forward by explaining CBT concepts in a way she can understand and start working toward her core beliefs, as they are at the root of everything she is believing about herself, her relationships, her vulnerability, and her obligations.  Recommendations:  Return to therapy in 2 weeks, engage in self care behaviors, let go of chores that will hurt her physically even if it means those chores don't get done  Plan: Return again in 2 weeks.  Diagnosis:  Moderate episode of recurrent major depressive disorder  GAD (generalized anxiety disorder)  Collaboration of Care: Psychiatrist AEB - doctor can read therapy notes , and vice versa  Patient/Guardian was advised Release of Information must be obtained prior to any record release in order to collaborate their care with an outside provider. Patient/Guardian was advised if they have not already done so to contact the registration department to sign  all necessary forms in order for [place] to release information regarding their care.   Consent: Patient/Guardian gives verbal consent for treatment and assignment of benefits for services provided during this visit. Patient/Guardian expressed understanding and agreed to proceed.   Lynnell Chad, LCSW 01/07/2023

## 2023-01-21 ENCOUNTER — Encounter (HOSPITAL_COMMUNITY): Payer: Self-pay | Admitting: Clinical

## 2023-01-21 ENCOUNTER — Other Ambulatory Visit: Payer: Self-pay | Admitting: Family Medicine

## 2023-01-21 ENCOUNTER — Ambulatory Visit (HOSPITAL_COMMUNITY): Payer: Medicare HMO | Admitting: Clinical

## 2023-01-21 DIAGNOSIS — F411 Generalized anxiety disorder: Secondary | ICD-10-CM

## 2023-01-21 DIAGNOSIS — R69 Illness, unspecified: Secondary | ICD-10-CM | POA: Diagnosis not present

## 2023-01-21 DIAGNOSIS — E782 Mixed hyperlipidemia: Secondary | ICD-10-CM

## 2023-01-21 DIAGNOSIS — F331 Major depressive disorder, recurrent, moderate: Secondary | ICD-10-CM | POA: Diagnosis not present

## 2023-01-21 NOTE — Progress Notes (Signed)
THERAPIST PROGRESS NOTE  Session Time: 2:03pm-3:03pm  Session #4  Participation Level: Active  Behavioral Response: Casual Alert Depressed and Hopeless  Type of Therapy: Individual Therapy  Treatment Goals addressed:  LTG: Aisley will score less than 5 on the Generalized Anxiety Disorder 7 Scale (GAD-7)  STG: Me[place] will reduce frequency of avoidant behaviors by 50% as evidenced by self-report in therapy sessions  LTG: Reduce frequency, intensity, and duration of depression symptoms so that daily functioning is improved   STG: Shivali will score less than 9 on the Patient Health Questionnaire (PHQ-9)    STG: Suesan will practice behavioral activation skills 2 times per week for the next 26 weeks  ProgressTowards Goals: Progressing  Interventions: CBT and Supportive  Summary: Julia [name] is a 62 y.o. female who presents with anxiety and depression for therapy.  She was limping and stated her partner was out-of-state all weekend and her son, daughter-in-law, and Asa Lente were also gone, so she spent the time outdoors working.  She noted that it was gratifying to get the work done and to see it look good, but the work itself is quite painful to her knee.  It is not something she would do if she felt she had a choice.  She was asked what she did for herself over the weekend, and could not think of anything.    Much of her sharing today was about her partner of 10 years and what she does not like/enjoy about him.  She shared a number of things she has asked him to do, without positive result of him following through.  For instance, moving their mailbox would make things easier on her physically but he will not do it.  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  No matter how much she asks him to clean their bathroom, he does not do it so she ends up doing it.  She stated they come from different worlds, saying he  is a redneck and a Chartered loss adjuster.  She tries to make life easier for him with the things she does and the end result is often that this ends up making things harder for her.  He states he appreciates things she does, but he does not demonstrate this through doing the same for her, adding "It's like he does not care."  CSW listened to these complaints and then explained the concept of thoughts affecting feelings, using some of her examples above to illustrate the various cognitive distractions.  She was provided with a copy of "Thinking Traps" which were reviewed one by one.  She could identify with most of them.  For the remainder of the session, whenever she did use one of the distortions, CSW asked her to look at what she had just said.  CSW explained and gave a number of examples of how to examine our thoughts to see if they are realistic, balanced, and helpful.  She was asked to consider her thoughts in the coming 2 weeks in relation to whether they are accurate or possibly distorted using the tools described.  She agreed to try this.  Suicidal/Homicidal: No  Therapist Response:  Patient is making progress in being willing to consider her own thoughts and their helpfulness in achieving her life goals.  She does feel stuck in her current manner of thinking but was open to the idea that sometimes she may be right and  sometimes she may be wrong in her perceptions.  She was able to laugh a little at herself as examples were given.  The most obvious example of how cognitive distortions negatively affect relationships was pointed out as being the issue between her and her son Julia [name], regarding his anger that she traveled one place for a funeral but felt she could not travel to his place to see his family.  He construed this as lack of love and desire, which she said "was the opposite of the truth."    CSW provided mood monitoring and treatment progress review in the context of this episode of treatment.   Patient  reported that her mood has been "okay".   Patient was able to start to hear how the way she thinks and the decisions she makes are not always the only choices available.  CSW gave patient the opportunity to explore thoughts and feelings associated with current life situations and past/present external stressors as desired.   CSW encouraged patient's expression of feelings and validated patient's thoughts, using empathy, active listening, open body language, and unconditional positive regard.  Patient demonstrated an orientation to time, place, person and situation.      Recommendations:  Return to therapy in 2 weeks, engage in self care behaviors, continue to work toward realizing she does not have to do  chores that will hurt her physically even if it means those chores don't get done, consider the Thinking Traps handout provided to her  Plan: Return again in 2 weeks.  Diagnosis:  Moderate episode of recurrent major depressive disorder  GAD (generalized anxiety disorder)  Collaboration of Care: Psychiatrist AEB - doctor can read therapy notes , and vice versa  Patient/Guardian was advised Release of Information must be obtained prior to any record release in order to collaborate their care with an outside provider. Patient/Guardian was advised if they have not already done so to contact the registration department to sign all necessary forms in order for [place] to release information regarding their care.   Consent: Patient/Guardian gives verbal consent for treatment and assignment of benefits for services provided during this visit. Patient/Guardian expressed understanding and agreed to proceed.   Lynnell Chad, LCSW 01/21/2023

## 2023-01-29 ENCOUNTER — Telehealth (HOSPITAL_COMMUNITY): Payer: Medicare HMO | Admitting: Psychiatry

## 2023-02-01 ENCOUNTER — Encounter (HOSPITAL_COMMUNITY): Payer: Self-pay | Admitting: Psychiatry

## 2023-02-01 ENCOUNTER — Telehealth (HOSPITAL_COMMUNITY): Payer: Medicare HMO | Admitting: Psychiatry

## 2023-02-01 DIAGNOSIS — F411 Generalized anxiety disorder: Secondary | ICD-10-CM

## 2023-02-01 DIAGNOSIS — E559 Vitamin D deficiency, unspecified: Secondary | ICD-10-CM

## 2023-02-01 DIAGNOSIS — F331 Major depressive disorder, recurrent, moderate: Secondary | ICD-10-CM

## 2023-02-01 MED ORDER — DULOXETINE HCL 60 MG PO CPEP
60.0000 mg | ORAL_CAPSULE | Freq: Two times a day (BID) | ORAL | 0 refills | Status: DC
Start: 2023-02-01 — End: 2023-04-26
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
## 2023-02-11 ENCOUNTER — Encounter (HOSPITAL_COMMUNITY): Payer: Self-pay | Admitting: Clinical

## 2023-02-11 ENCOUNTER — Ambulatory Visit (HOSPITAL_COMMUNITY): Payer: Medicare HMO | Admitting: Clinical

## 2023-02-11 DIAGNOSIS — F411 Generalized anxiety disorder: Secondary | ICD-10-CM

## 2023-02-11 DIAGNOSIS — F331 Major depressive disorder, recurrent, moderate: Secondary | ICD-10-CM | POA: Diagnosis not present

## 2023-02-11 NOTE — Progress Notes (Unsigned)
   THERAPIST PROGRESS NOTE  Session Time: 10:02am-11:00am  Session #5  Participation Level: Active  Behavioral Response: Casual Alert Anxious  Type of Therapy: Individual Therapy  Treatment Goals addressed:  LTG: Julia [name] will score less than 5 on the Generalized Anxiety Disorder 7 Scale (GAD-7)  STG: Julia [name] will reduce frequency of avoidant behaviors by 50% as evidenced by self-report in therapy sessions  LTG: Reduce frequency, intensity, and duration of depression symptoms so that daily functioning is improved   STG: Julia [name] will score less than 9 on the Patient Health Questionnaire (PHQ-9)    STG: Julia [name] will practice behavioral activation skills 2 times per week for the next 26 weeks  ProgressTowards Goals: Progressing  Interventions: CBT and Other: Communication/"I" statements  Summary: Julia [name] is a 63 y.o. female who presents with anxiety and depression for therapy.  ***  Suicidal/Homicidal: No  Therapist Response:  Patient is making progress AEB ***   Recommendations:  Return to therapy in 2-3 weeks, engage in self care behaviors, continue to work toward realizing she does not have to do  chores that will hurt her physically even if it means those chores don't get done, consider the Thinking Traps handout provided to her  Plan: Return again in 2-3 weeks.  Diagnosis:  GAD (generalized anxiety disorder)  Moderate episode of recurrent major depressive disorder (HCC)  Collaboration of Care: Psychiatrist AEB - doctor can read therapy notes , and vice versa  Patient/Guardian was advised Release of Information must be obtained prior to any record release in order to collaborate their care with an outside provider. Patient/Guardian was advised if they have not already done so to contact the registration department to sign all necessary forms in order for [place] to release information regarding their care.   Consent: Patient/Guardian gives verbal consent for treatment and  assignment of benefits for services provided during this visit. Patient/Guardian expressed understanding and agreed to proceed.   Lynnell Chad, LCSW 02/11/2023

## 2023-02-13 DIAGNOSIS — M1712 Unilateral primary osteoarthritis, left knee: Secondary | ICD-10-CM | POA: Diagnosis not present

## 2023-03-04 ENCOUNTER — Ambulatory Visit (HOSPITAL_COMMUNITY): Payer: Medicare HMO | Admitting: Clinical

## 2023-03-04 ENCOUNTER — Encounter (HOSPITAL_COMMUNITY): Payer: Self-pay | Admitting: Clinical

## 2023-03-04 DIAGNOSIS — F411 Generalized anxiety disorder: Secondary | ICD-10-CM

## 2023-03-04 DIAGNOSIS — F331 Major depressive disorder, recurrent, moderate: Secondary | ICD-10-CM | POA: Diagnosis not present
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
## 2023-03-07 ENCOUNTER — Other Ambulatory Visit: Payer: Self-pay | Admitting: Family Medicine

## 2023-03-07 DIAGNOSIS — G629 Polyneuropathy, unspecified: Secondary | ICD-10-CM

## 2023-03-07 DIAGNOSIS — M1990 Unspecified osteoarthritis, unspecified site: Secondary | ICD-10-CM

## 2023-03-15 ENCOUNTER — Telehealth (HOSPITAL_BASED_OUTPATIENT_CLINIC_OR_DEPARTMENT_OTHER): Payer: Medicare HMO | Admitting: Psychiatry

## 2023-03-15 ENCOUNTER — Encounter (HOSPITAL_COMMUNITY): Payer: Self-pay | Admitting: Psychiatry

## 2023-03-15 DIAGNOSIS — F411 Generalized anxiety disorder: Secondary | ICD-10-CM | POA: Diagnosis not present

## 2023-03-15 DIAGNOSIS — F331 Major depressive disorder, recurrent, moderate: Secondary | ICD-10-CM

## 2023-03-15 MED ORDER — BUPROPION HCL ER (XL) 300 MG PO TB24: 300.0000 mg | ORAL_TABLET | Freq: Every day | ORAL | 1 refills | Status: DC
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
## 2023-03-25 ENCOUNTER — Encounter (HOSPITAL_COMMUNITY): Payer: Self-pay | Admitting: Clinical

## 2023-03-25 ENCOUNTER — Ambulatory Visit (HOSPITAL_COMMUNITY): Payer: Medicare HMO | Admitting: Clinical

## 2023-03-25 ENCOUNTER — Telehealth: Payer: Self-pay | Admitting: Family Medicine

## 2023-03-25 DIAGNOSIS — F411 Generalized anxiety disorder: Secondary | ICD-10-CM

## 2023-03-25 DIAGNOSIS — F331 Major depressive disorder, recurrent, moderate: Secondary | ICD-10-CM | POA: Diagnosis not present
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
## 2023-03-25 NOTE — Telephone Encounter (Signed)
  Prescription Request  03/25/2023  Is this a "Controlled Substance" medicine? NO  Have you seen your PCP in the last 2 weeks? PT MADE APPOINTMENT FOR JULY 25  If YES, route message to pool  -  If NO, patient needs to be scheduled for appointment.  What is the name of the medication or equipment? MELOXICAN, GABAPENTIN, AND ATORVASTATIN  Have you contacted your pharmacy to request a refill? YES   Which pharmacy would you like this sent to? [facility]   Patient notified that their request is being sent to the clinical staff for review and that they should receive a response within 2 business days.

## 2023-03-27 NOTE — Telephone Encounter (Signed)
Needs to be an in person visit.

## 2023-03-27 NOTE — Telephone Encounter (Signed)
Patient aware and verbalizes understanding. 

## 2023-04-11 ENCOUNTER — Ambulatory Visit: Payer: Medicare HMO | Admitting: Family Medicine

## 2023-04-15 ENCOUNTER — Ambulatory Visit (HOSPITAL_COMMUNITY): Payer: Medicare HMO | Admitting: Clinical

## 2023-04-15 ENCOUNTER — Encounter (HOSPITAL_COMMUNITY): Payer: Self-pay | Admitting: Clinical

## 2023-04-15 DIAGNOSIS — F411 Generalized anxiety disorder: Secondary | ICD-10-CM

## 2023-04-15 DIAGNOSIS — F331 Major depressive disorder, recurrent, moderate: Secondary | ICD-10-CM

## 2023-04-15 NOTE — Progress Notes (Unsigned)
THERAPIST PROGRESS NOTE  Session Time: 2:05-3:04pm  Session #8  Participation Level: Active  Behavioral Response: Casual Alert Angry, Depressed, and Tearful  Type of Therapy: Individual Therapy  Treatment Goals addressed:  LTG: Ren[place]may will score less than 5 on the Generalized Anxiety Disorder 7 Scale (GAD-7)  STG: Bertie will reduce frequency of avoidant behaviors by 50% as evidenced by self-report in therapy sessions  LTG: Reduce frequency, intensity, and duration of depression symptoms so that daily functioning is improved   STG: Slyvia will score less than 9 on the Patient Health Questionnaire (PHQ-9)    STG: [place] will practice behavioral activation skills 2 times per week for the next 26 weeks  ProgressTowards Goals: Progressing  Interventions: Assertiveness Training, Supportive, and Other: communication skills  Summary: ORIYAH LAMPHEAR is a 62 y.o. female who presents with anxiety and depression for therapy.  She showed evidence of being in significant physical pain, stated she knew she had this appointment today at 2:00pm, so was afraid last night to take her sleep medication.  Therefore she did not go to sleep until 7:15am and got up at 10:00am.  She reported that she has had many crying spells in the last few weeks, a lot from pain as well as from frustration at not getting things done, her partner not helping her like she thinks he should, and losing the ability to do things herself like she used to have due to her medical issues and pain.  For instance, she just bought a building in which to place a lot of her totes that are currently in their dining room, but she is sure that her partner will not put it together and she will end up having to do that.  CSW spent much time processing with her how things might be different if she asks him to do things using "I" statements to express a need rather than if she blames him for not doing things with "you" statements that express frustration  for her need not being met.  This has been reviewed previously with the patient without a change in her behavior.  She also complained about how her partner puts items down on the counter rather than putting them away, so she ends up putting them way.  When it was suggested that she call him to the room to point the items out and have him put them up, she stated she has already tried.  She was quite distraught in talking about these items, did not appear to be favorably disposed to try the suggestions.  CSW asked her what she WANTS to do differently, and she came up with the following:  Stop getting so frustrated with partner - "It's like talking to a wall." Be able to do what she could "before."  We had a lengthy discussion about the weeds she previously pulled out of a certain location which took days and caused her much pain.  Her partner did not do what was requested to get that area under control afterward, and therefore the weeds have all grown up to be as heavy and tall as they were previously.  Changing expectations was heavily encouraged.  Acceptance of changes in our bodies as we age was also recommended.  She has a difficult time with both those concepts.  The patient reported that she often forgets to take her medications.  She has stopped using her pillbox but rather retrieves the medicines out of the actual pill bottles.  She was encouraged to  resume the pillbox since it prevents overtaking as well as undertaking medicine.  She also turned off the alarm previously set by CSW on her phone for her nighttime medicines, part of the reason she could not sleep last night.    Suicidal/Homicidal: No  Therapist Response: Patient is progressing AEB engaging in scheduled therapy session.  She presented oriented x5 and stated she was feeling "in a lot of pain" which was quite evident in the way she could barely walk.  She is due for injections into her knee next week.  She does not believe her partner  knows what level of pain she is experiencing, so CSW encouraged her to take her partner with her into the doctor's office and ask for an explanation to be rendered to help him comprehend better.  CSW evaluated patient's medication compliance and self-care since last session.  She is doing practically nothing to take care of herself, for instance will not rest her body but insists on doing the dishes because if she does not know, she believes nobody else will.  She is also not compliant with her medications regularly due to forgetfulness.    Throughout the session, CSW gave patient the opportunity to explore thoughts and feelings associated with current life situations and past/present external stressors.   CSW encouraged patient's expression of feelings and validated patient's thoughts using empathy, active listening, open body language, and unconditional positive regard.       From 2 sessions ago not addressed, but still needed: She was given examples and became willing to take on the task of meeting every negative thought about her fiance with 3 things about him she is either grateful for or likes/loves.  She said she knows something is missing from that relationship and CSW encouraged her to do this assignment between now and next session on an ongoing basis to see if what is missing is recognition of the good parts.  She was reminded that at last session she stated "I am so fucking disgusting."   Rather than being willing to talk about this as had been planned at the end of last session, she could not believe she had used the curse word in this, was more focused on that than on the content.  This again needs to be discussed at some point.   Recommendations:  Return to therapy in 3 weeks, engage in self care behaviors, take fiance with her to doctor's appointment to learn more about her pain level  Plan: Return again in 3 weeks. Next appointment:  8/5  Diagnosis:  Moderate episode of recurrent major  depressive disorder (HCC)  GAD (generalized anxiety disorder)  Collaboration of Care: Psychiatrist AEB - doctor can read therapy notes , and vice versa  Patient/Guardian was advised Release of Information must be obtained prior to any record release in order to collaborate their care with an outside provider. Patient/Guardian was advised if they have not already done so to contact the registration department to sign all necessary forms in order for [place] to release information regarding their care.   Consent: Patient/Guardian gives verbal consent for treatment and assignment of benefits for services provided during this visit. Patient/Guardian expressed understanding and agreed to proceed.   Lynnell Chad, LCSW 04/15/2023

## 2023-04-24 DIAGNOSIS — M1712 Unilateral primary osteoarthritis, left knee: Secondary | ICD-10-CM | POA: Diagnosis not present

## 2023-04-25 ENCOUNTER — Ambulatory Visit (INDEPENDENT_AMBULATORY_CARE_PROVIDER_SITE_OTHER): Payer: Medicare HMO | Admitting: Family Medicine

## 2023-04-25 ENCOUNTER — Encounter: Payer: Self-pay | Admitting: Family Medicine

## 2023-04-25 VITALS — BP 116/83 | HR 76 | Temp 97.4°F | Ht 61.0 in | Wt 154.6 lb

## 2023-04-25 DIAGNOSIS — F411 Generalized anxiety disorder: Secondary | ICD-10-CM

## 2023-04-25 DIAGNOSIS — E782 Mixed hyperlipidemia: Secondary | ICD-10-CM | POA: Diagnosis not present

## 2023-04-25 DIAGNOSIS — R790 Abnormal level of blood mineral: Secondary | ICD-10-CM | POA: Diagnosis not present

## 2023-04-25 DIAGNOSIS — R7303 Prediabetes: Secondary | ICD-10-CM

## 2023-04-25 DIAGNOSIS — E538 Deficiency of other specified B group vitamins: Secondary | ICD-10-CM

## 2023-04-25 DIAGNOSIS — G629 Polyneuropathy, unspecified: Secondary | ICD-10-CM

## 2023-04-25 DIAGNOSIS — E559 Vitamin D deficiency, unspecified: Secondary | ICD-10-CM

## 2023-04-25 DIAGNOSIS — M1990 Unspecified osteoarthritis, unspecified site: Secondary | ICD-10-CM

## 2023-04-25 LAB — CMP14+EGFR
ALT: 13 IU/L (ref 0–32)
AST: 17 IU/L (ref 0–40)
Albumin: 4.3 g/dL (ref 3.9–4.9)
Alkaline Phosphatase: 87 IU/L (ref 44–121)
BUN/Creatinine Ratio: 11 — ABNORMAL LOW (ref 12–28)
BUN: 11 mg/dL (ref 8–27)
Bilirubin Total: 0.4 mg/dL (ref 0.0–1.2)
CO2: 24 mmol/L (ref 20–29)
Calcium: 10.1 mg/dL (ref 8.7–10.3)
Chloride: 104 mmol/L (ref 96–106)
Creatinine, Ser: 1.04 mg/dL — ABNORMAL HIGH (ref 0.57–1.00)
Globulin, Total: 2.3 g/dL (ref 1.5–4.5)
Glucose: 111 mg/dL — ABNORMAL HIGH (ref 70–99)
Potassium: 5 mmol/L (ref 3.5–5.2)
Sodium: 141 mmol/L (ref 134–144)
Total Protein: 6.6 g/dL (ref 6.0–8.5)
eGFR: 61 mL/min/{1.73_m2} (ref >59)

## 2023-04-25 LAB — ANEMIA PROFILE B
Basophils Absolute: 0 10*3/uL (ref 0.0–0.2)
Basos: 1 %
EOS (ABSOLUTE): 0.2 10*3/uL (ref 0.0–0.4)
Eos: 3 %
Hematocrit: 40.8 % (ref 34.0–46.6)
Hemoglobin: 13.5 g/dL (ref 11.1–15.9)
Immature Grans (Abs): 0 10*3/uL (ref 0.0–0.1)
Immature Granulocytes: 0 %
Lymphocytes Absolute: 1.2 10*3/uL (ref 0.7–3.1)
Lymphs: 22 %
MCH: 31 pg (ref 26.6–33.0)
MCHC: 33.1 g/dL (ref 31.5–35.7)
MCV: 94 fL (ref 79–97)
Monocytes Absolute: 0.4 10*3/uL (ref 0.1–0.9)
Monocytes: 7 %
Neutrophils Absolute: 3.8 10*3/uL (ref 1.4–7.0)
Neutrophils: 67 %
Platelets: 273 10*3/uL (ref 150–450)
RBC: 4.35 x10E6/uL (ref 3.77–5.28)
RDW: 12.3 % (ref 11.7–15.4)
Retic Ct Pct: 1.6 % (ref 0.6–2.6)
WBC: 5.6 10*3/uL (ref 3.4–10.8)

## 2023-04-25 LAB — VITAMIN D 25 HYDROXY (VIT D DEFICIENCY, FRACTURES)

## 2023-04-25 LAB — LIPID PANEL
Chol/HDL Ratio: 3.9 ratio (ref 0.0–4.4)
Cholesterol, Total: 247 mg/dL — ABNORMAL HIGH (ref 100–199)
HDL: 64 mg/dL (ref >39)
LDL Chol Calc (NIH): 165 mg/dL — ABNORMAL HIGH (ref 0–99)
Triglycerides: 103 mg/dL (ref 0–149)
VLDL Cholesterol Cal: 18 mg/dL (ref 5–40)

## 2023-04-25 LAB — BAYER DCA HB A1C WAIVED: HB A1C (BAYER DCA - WAIVED): 5.4 % (ref 4.8–5.6)

## 2023-04-25 LAB — THYROID PANEL WITH TSH

## 2023-04-25 MED ORDER — ATORVASTATIN CALCIUM 40 MG PO TABS
40.0000 mg | ORAL_TABLET | Freq: Every evening | ORAL | 1 refills | Status: DC
Start: 2023-04-25 — End: 2023-10-28

## 2023-04-25 MED ORDER — GABAPENTIN 300 MG PO CAPS: 300.0000 mg | ORAL_CAPSULE | Freq: Two times a day (BID) | ORAL | 1 refills | Status: DC

## 2023-04-25 MED ORDER — MELOXICAM 15 MG PO TABS
15.0000 mg | ORAL_TABLET | Freq: Every day | ORAL | 1 refills | Status: DC
Start: 2023-04-25 — End: 2023-10-28

## 2023-04-25 MED ORDER — KETOROLAC TROMETHAMINE 30 MG/ML IJ SOLN
30.0000 mg | Freq: Once | INTRAMUSCULAR | Status: AC
Start: 2023-04-25 — End: 2023-04-25
  Administered 2023-04-25: 30 mg via INTRAMUSCULAR

## 2023-04-25 NOTE — Progress Notes (Signed)
Subjective:  Patient ID: Julia [name], female    DOB: 29-Oct-1960, 62 y.o.   MRN: [id number]  Patient Care Team: Sonny Masters, FNP as PCP - General (Family Medicine) Gardiner Sleeper (Psychiatry) Powers, Patrick North, MD as Referring Physician (Neurosurgery) Donnetta Hail, MD as Consulting Physician (Rheumatology) Nahser, Deloris Ping, MD as Consulting Physician (Cardiology) Luciano Cutter, MD as Consulting Physician (Pulmonary Disease) Michaelle Copas, MD as Referring Physician (Optometry)   Chief Complaint:  Medical Management of Chronic Issues and Arthritis (Bilateral hands )   HPI: Julia [name] is a 62 y.o. female presenting on 04/25/2023 for Medical Management of Chronic Issues and Arthritis (Bilateral hands )   1. Mixed hyperlipidemia Compliant with medications - Yes Current medications - atorvastatin  Side effects from medications - No Diet - generally healthy Exercise - active daily  2. Neuropathy 3. Arthritis She is on gabapentin, cymbalta, mobic and as needed robaxin for pain relief. She recently had a left knee injection with some relief of pain but continues to have swelling. She is also complaining or bilateral hand pain, right worse than left. Has been out of her mobic for a few days and states pain has worsened since this time. No new injuries. Does have swelling and decreased grip strength in left hand at times.   4. Vitamin D insufficiency Pt is taking oral repletion therapy. Denies bone pain and tenderness, muscle weakness, fracture, and difficulty walking. Lab Results  Component Value Date   VD25OH 38.1 07/03/2022   VD25OH 29.4 (L) 12/28/2021   VD25OH 42.6 06/22/2021   Lab Results  Component Value Date   CALCIUM 9.3 07/03/2022   PHOS 4.1 05/29/2020      5. Vitamin B12 deficiency Pt is not on repletion therapy at this time. Does have neuropathy, not new or worsening.   6. GAD (generalized anxiety disorder) Taking wellbutrin and  cymbalta, tolerating well. Denies new or worsening symptoms. Denies SI or HI.     04/25/2023   10:23 AM 11/23/2022   11:17 AM 08/02/2022    1:46 PM 07/03/2022    9:12 AM  GAD 7 : Generalized Anxiety Score  Nervous, Anxious, on Edge 3   3  Control/stop worrying 3   3  Worry too much - different things 3   3  Trouble relaxing 3   2  Restless 3   2  Easily annoyed or irritable 3   2  Afraid - awful might happen 2   2  Total GAD 7 Score 20   17  Anxiety Difficulty Not difficult at all   Very difficult     Information is confidential and restricted. Go to Review Flowsheets to unlock data.       04/25/2023   10:22 AM 11/23/2022   11:16 AM 08/02/2022    1:44 PM 07/03/2022    9:11 AM 05/09/2022    9:07 AM  Depression screen PHQ 2/9  Decreased Interest 3   3 3   Down, Depressed, Hopeless 3   3 3   PHQ - 2 Score 6   6 6   Altered sleeping 3   3 3   Tired, decreased energy 3   3 3   Change in appetite 3   2 3   Feeling bad or failure about yourself  2   2 2   Trouble concentrating 1   2 2   Moving slowly or fidgety/restless 1   2 0  Suicidal thoughts 0   0 1  PHQ-9 Score 19   20 20   Difficult doing work/chores Not difficult at all   Extremely dIfficult Very difficult     Information is confidential and restricted. Go to Review Flowsheets to unlock data.    7. Prediabetes Denies polyuria, polyphagia, or polydipsia. Tries to follow a healthy diet.      Relevant past medical, surgical, family, and social history reviewed and updated as indicated.  Allergies and medications reviewed and updated. Data reviewed: Chart in Epic.   Past Medical History:  Diagnosis Date   Anxiety    Arthritis    Complication of anesthesia    slow to wake up-does not take much medicine to sedate her   Depression    Esophageal reflux    Hyperlipidemia    Low ferritin 02/10/2021   Paget disease of bone    Pneumonia due to COVID-19 virus 05/20/2020   Vitamin B12 deficiency 02/10/2021   Vitamin D insufficiency  02/10/2021   Wears glasses     Past Surgical History:  Procedure Laterality Date   ANTERIOR FUSION CERVICAL SPINE  10/2019   C3-C5   BREAST CYST EXCISION Left    CERVICAL SPINE SURGERY  10/2011   CESAREAN SECTION     DILATION AND CURETTAGE OF UTERUS  1986   KNEE ARTHROSCOPY Left 07/22/2013   Procedure: LEFT KNEE ARTHROSCOPY WITH DEBRIDEMENT CHONDROPLASTY, REMOVAL LOOSE BODIES, PARTIAL MEDIAL MENISCECTOMY, OPEN EXCISION OF PES GANGLION;  Surgeon: [name], MD;  Location: [facility];  Service: Orthopedics;  Laterality: Left;  NO SPECIMEN SENT PER DR. [name] ORDER.   KNEE SURGERY Left 11/21/2021   SPINE SURGERY  10/26/2019   TOTAL ABDOMINAL HYSTERECTOMY  05/2010   TAH   TUBAL LIGATION  1989    Social History   Socioeconomic History   Marital status: Significant Other    Spouse name: [name]   Number of children: 3   Years of education: Not on file   Highest education level: 12th grade  Occupational History   Occupation: disability  Tobacco Use   Smoking status: Former    Current packs/day: 0.00    Average packs/day: 0.2 packs/day for 5.0 years (0.8 ttl pk-yrs)    Types: Cigarettes    Start date: 01/03/1980    Quit date: 01/02/1985    Years since quitting: 38.3   Smokeless tobacco: Never   Tobacco comments:    1 pack per week per pt.   Vaping Use   Vaping status: Never Used  Substance and Sexual Activity   Alcohol use: Yes    Alcohol/week: 1.0 standard drink of alcohol    Types: 1 Glasses of wine per week    Comment: occassional   Drug use: No   Sexual activity: Not Currently  Other Topics Concern   Not on file  Social History Narrative   2 sons live nearby, one son in [place]    05/09/2022 - Has 3 grandchildren - one almost a year old   Social Determinants of Health   Financial Resource Strain: Low Risk  (04/23/2023)   Overall Financial Resource Strain (CARDIA)    Difficulty of Paying Living Expenses: Not very hard  Food Insecurity: No  Food Insecurity (04/23/2023)   Hunger Vital Sign    Worried About Running Out of Food in the Last Year: Never true    Ran Out of Food in the Last Year: Never true  Transportation Needs: No Transportation Needs (04/23/2023)   PRAPARE - Transportation    Lack  of Transportation (Medical): No    Lack of Transportation (Non-Medical): No  Physical Activity: Inactive (04/23/2023)   Exercise Vital Sign    Days of Exercise per Week: 0 days    Minutes of Exercise per Session: 10 min  Stress: Stress Concern Present (04/23/2023)   [employer] of Occupational Health - Occupational Stress Questionnaire    Feeling of Stress : Very much  Social Connections: Moderately Isolated (04/23/2023)   Social Connection and Isolation Panel [NHANES]    Frequency of Communication with Friends and Family: Once a week    Frequency of Social Gatherings with Friends and Family: Once a week    Attends Religious Services: Never    Database administrator or Organizations: No    Attends Engineer, structural: 1 to 4 times per year    Marital Status: Living with partner  Intimate Partner Violence: Not At Risk (05/09/2022)   Humiliation, Afraid, Rape, and [name] questionnaire    Fear of Current or Ex-Partner: No    Emotionally Abused: No    Physically Abused: No    Sexually Abused: No    Outpatient Encounter Medications as of 04/25/2023  Medication Sig   buPROPion (WELLBUTRIN XL) 300 MG 24 hr tablet Take 1 tablet (300 mg total) by mouth daily.   Cholecalciferol (VITAMIN D3) 50 MCG (2000 UT) capsule Take 2,000 Units by mouth daily.   DULoxetine (CYMBALTA) 60 MG capsule Take 1 capsule (60 mg total) by mouth 2 (two) times daily.   methocarbamol (ROBAXIN) 500 MG tablet Take 1 tablet (500 mg total) by mouth every 8 (eight) hours as needed for muscle spasms.   Probiotic Product (PROBIOTIC DAILY PO) Take 1 tablet by mouth daily.   traZODone (DESYREL) 50 MG tablet Take 0.5 tablets (25 mg total) by mouth at bedtime.    [DISCONTINUED] atorvastatin (LIPITOR) 40 MG tablet Take 1 tablet (40 mg total) by mouth every evening. (NEEDS TO BE SEEN BEFORE NEXT REFILL)   [DISCONTINUED] gabapentin (NEURONTIN) 300 MG capsule TAKE ONE CAPSULE EVERY MORNING AND TWO AT BEDTIME   [DISCONTINUED] meloxicam (MOBIC) 15 MG tablet TAKE ONE TABLET ONCE DAILY   atorvastatin (LIPITOR) 40 MG tablet Take 1 tablet (40 mg total) by mouth every evening.   gabapentin (NEURONTIN) 300 MG capsule Take 1 capsule (300 mg total) by mouth 2 (two) times daily.   meloxicam (MOBIC) 15 MG tablet Take 1 tablet (15 mg total) by mouth daily.   [DISCONTINUED] omeprazole (PRILOSEC) 40 MG capsule TAKE 1 CAPSULE DAILY   [EXPIRED] ketorolac (TORADOL) 30 MG/ML injection 30 mg    No facility-administered encounter medications on file as of 04/25/2023.    Allergies  Allergen Reactions   Lamotrigine Itching and Swelling    Review of Systems  Constitutional:  Negative for activity change, appetite change, chills, diaphoresis, fatigue, fever and unexpected weight change.  HENT: Negative.    Eyes: Negative.  Negative for photophobia and visual disturbance.  Respiratory:  Negative for cough, chest tightness and shortness of breath.   Cardiovascular:  Negative for chest pain, palpitations and leg swelling.  Gastrointestinal:  Negative for abdominal pain, blood in stool, constipation, diarrhea, nausea and vomiting.  Endocrine: Negative.  Negative for polydipsia, polyphagia and polyuria.  Genitourinary:  Negative for decreased urine volume, difficulty urinating, dysuria, frequency and urgency.  Musculoskeletal:  Positive for arthralgias, gait problem, joint swelling and myalgias. Negative for neck stiffness.  Skin: Negative.   Allergic/Immunologic: Negative.   Neurological:  Negative for dizziness, tremors, seizures, syncope,  facial asymmetry, speech difficulty, weakness, light-headedness, numbness and headaches.  Hematological: Negative.    Psychiatric/Behavioral:  Negative for confusion, hallucinations, sleep disturbance and suicidal ideas.   All other systems reviewed and are negative.       Objective:  BP 116/83   Pulse 76   Temp (!) 97.4 F (36.3 C) (Temporal)   Ht 5\' 1"  (1.549 m)   Wt 154 lb 9.6 oz (70.1 kg)   LMP 01/02/2010   SpO2 98%   BMI 29.21 kg/m    Wt Readings from Last 3 Encounters:  04/25/23 154 lb 9.6 oz (70.1 kg)  07/03/22 161 lb (73 kg)  05/09/22 165 lb (74.8 kg)    Physical Exam Vitals and nursing note reviewed.  Constitutional:      General: She is not in acute distress.    Appearance: Normal appearance. She is well-developed, well-groomed and overweight. She is not ill-appearing, toxic-appearing or diaphoretic.  HENT:     Head: Normocephalic and atraumatic.     Jaw: There is normal jaw occlusion.     Right Ear: Hearing normal.     Left Ear: Hearing normal.     Nose: Nose normal.     Mouth/Throat:     Lips: Pink.     Mouth: Mucous membranes are moist.     Pharynx: Oropharynx is clear. Uvula midline.  Eyes:     General: Lids are normal.     Extraocular Movements: Extraocular movements intact.     Conjunctiva/sclera: Conjunctivae normal.     Pupils: Pupils are equal, round, and reactive to light.  Neck:     Thyroid: No thyroid mass, thyromegaly or thyroid tenderness.     Vascular: No carotid bruit or JVD.     Trachea: Trachea and phonation normal.  Cardiovascular:     Rate and Rhythm: Normal rate and regular rhythm.     Chest Wall: PMI is not displaced.     Pulses: Normal pulses.     Heart sounds: Normal heart sounds. No murmur heard.    No friction rub. No gallop.  Pulmonary:     Effort: Pulmonary effort is normal. No respiratory distress.     Breath sounds: Normal breath sounds. No wheezing.  Abdominal:     General: Bowel sounds are normal. There is no distension or abdominal bruit.     Palpations: Abdomen is soft. There is no hepatomegaly or splenomegaly.     Tenderness:  There is no abdominal tenderness. There is no right CVA tenderness or left CVA tenderness.     Hernia: No hernia is present.  Musculoskeletal:     Right wrist: Normal.     Left wrist: Normal.     Right hand: Swelling and tenderness present. No lacerations. Normal capillary refill. Normal pulse.     Left hand: Swelling and tenderness present. No lacerations. Normal capillary refill. Normal pulse.     Cervical back: Normal range of motion and neck supple.     Left upper leg: Normal.     Right knee: Normal.     Left knee: Swelling present. Decreased range of motion. Tenderness present.     Right lower leg: No edema.     Left lower leg: No edema.  Lymphadenopathy:     Cervical: No cervical adenopathy.  Skin:    General: Skin is warm and dry.     Capillary Refill: Capillary refill takes less than 2 seconds.     Coloration: Skin is not cyanotic, jaundiced or pale.  Findings: No rash.  Neurological:     General: No focal deficit present.     Mental Status: She is alert and oriented to person, place, and time.     Sensory: Sensation is intact.     Motor: Motor function is intact.     Coordination: Coordination is intact.     Gait: Gait is intact.     Deep Tendon Reflexes: Reflexes are normal and symmetric.  Psychiatric:        Attention and Perception: Attention and perception normal.        Mood and Affect: Mood and affect normal.        Speech: Speech normal.        Behavior: Behavior normal. Behavior is cooperative.        Thought Content: Thought content normal.        Cognition and Memory: Cognition and memory normal.        Judgment: Judgment normal.     Results for orders placed or performed in visit on 07/03/22  Anemia Profile B  Result Value Ref Range   Total Iron Binding Capacity 264 250 - 450 ug/dL   UIBC 161 096 - 045 ug/dL   Iron 409 27 - 811 ug/dL   Iron Saturation 39 15 - 55 %   Ferritin 37 15 - 150 ng/mL   Vitamin B-12 278 232 - 1,245 pg/mL   Folate 5.6 >3.0  ng/mL   WBC 7.0 3.4 - 10.8 x10E3/uL   RBC 4.34 3.77 - 5.28 x10E6/uL   Hemoglobin 13.7 11.1 - 15.9 g/dL   Hematocrit 91.4 78.2 - 46.6 %   MCV 94 79 - 97 fL   MCH 31.6 26.6 - 33.0 pg   MCHC 33.6 31.5 - 35.7 g/dL   RDW 95.6 21.3 - 08.6 %   Platelets 231 150 - 450 x10E3/uL   Neutrophils 76 Not Estab. %   Lymphs 16 Not Estab. %   Monocytes 6 Not Estab. %   Eos 2 Not Estab. %   Basos 0 Not Estab. %   Neutrophils Absolute 5.3 1.4 - 7.0 x10E3/uL   Lymphocytes Absolute 1.1 0.7 - 3.1 x10E3/uL   Monocytes Absolute 0.4 0.1 - 0.9 x10E3/uL   EOS (ABSOLUTE) 0.1 0.0 - 0.4 x10E3/uL   Basophils Absolute 0.0 0.0 - 0.2 x10E3/uL   Immature Granulocytes 0 Not Estab. %   Immature Grans (Abs) 0.0 0.0 - 0.1 x10E3/uL   Retic Ct Pct 1.6 0.6 - 2.6 %  CMP14+EGFR  Result Value Ref Range   Glucose 98 70 - 99 mg/dL   BUN 18 8 - 27 mg/dL   Creatinine, Ser 5.78 0.57 - 1.00 mg/dL   eGFR 70 >46 NG/EXB/2.84   BUN/Creatinine Ratio 19 12 - 28   Sodium 144 134 - 144 mmol/L   Potassium 4.3 3.5 - 5.2 mmol/L   Chloride 107 (H) 96 - 106 mmol/L   CO2 23 20 - 29 mmol/L   Calcium 9.3 8.7 - 10.3 mg/dL   Total Protein 6.3 6.0 - 8.5 g/dL   Albumin 4.3 3.8 - 4.9 g/dL   Globulin, Total 2.0 1.5 - 4.5 g/dL   Albumin/Globulin Ratio 2.2 1.2 - 2.2   Bilirubin Total 0.4 0.0 - 1.2 mg/dL   Alkaline Phosphatase 73 44 - 121 IU/L   AST 16 0 - 40 IU/L   ALT 11 0 - 32 IU/L  Lipid panel  Result Value Ref Range   Cholesterol, Total 158 100 - 199 mg/dL  Triglycerides 71 0 - 149 mg/dL   HDL 75 >16 mg/dL   VLDL Cholesterol Cal 14 5 - 40 mg/dL   LDL Chol Calc (NIH) 69 0 - 99 mg/dL   Chol/HDL Ratio 2.1 0.0 - 4.4 ratio  VITAMIN D 25 Hydroxy (Vit-D Deficiency, Fractures)  Result Value Ref Range   Vit D, 25-Hydroxy 38.1 30.0 - 100.0 ng/mL  Thyroid Panel With TSH  Result Value Ref Range   TSH 2.910 0.450 - 4.500 uIU/mL   T4, Total 6.2 4.5 - 12.0 ug/dL   T3 Uptake Ratio 28 24 - 39 %   Free Thyroxine Index 1.7 1.2 - 4.9  Hgb A1c w/o  eAG  Result Value Ref Range   Hgb A1c MFr Bld 5.8 (H) 4.8 - 5.6 %  Specimen status report  Result Value Ref Range   specimen status report Comment        Pertinent labs & imaging results that were available during my care of the patient were reviewed by me and considered in my medical decision making.  Assessment & Plan:  Stasia was seen today for medical management of chronic issues and arthritis.  Diagnoses and all orders for this visit:  Mixed hyperlipidemia Diet encouraged - increase intake of fresh fruits and vegetables, increase intake of lean proteins. Bake, broil, or grill foods. Avoid fried, greasy, and fatty foods. Avoid fast foods. Increase intake of fiber-rich whole grains. Exercise encouraged - at least 150 minutes per week and advance as tolerated.  Goal BMI < 25. Continue medications as prescribed. Follow up in 3-6 months as discussed.  -     atorvastatin (LIPITOR) 40 MG tablet; Take 1 tablet (40 mg total) by mouth every evening. -     Lipid panel -     CMP14+EGFR  Neuropathy Doing well on below, will continue. Will check for possible underlying causes.  -     gabapentin (NEURONTIN) 300 MG capsule; Take 1 capsule (300 mg total) by mouth 2 (two) times daily. -     Anemia Profile B  Arthritis Does well on current regimen but has been out of Mobic for several days which has increased her symptoms. Will burst with toradol in office today. Pt aware to report new, worsening, or persistent symptoms.  -     meloxicam (MOBIC) 15 MG tablet; Take 1 tablet (15 mg total) by mouth daily. -     ketorolac (TORADOL) 30 MG/ML injection 30 mg  Vitamin D insufficiency Labs pending. Continue repletion therapy. If indicated, will change repletion dosage. Eat foods rich in Vit D including milk, orange juice, yogurt with vitamin D added, salmon or mackerel, canned tuna fish, cereals with vitamin D added, and cod liver oil. Get out in the sun but make sure to wear at least SPF 30 sunscreen.   -     VITAMIN D 25 Hydroxy (Vit-D Deficiency, Fractures) -     CMP14+EGFR  Vitamin B12 deficiency Labs pending, will start repletion therapy if warranted.  -     Anemia Profile B  GAD (generalized anxiety disorder) States doing well on current regimen. Will continue.  -     Thyroid Panel With TSH  Prediabetes Diet and exercise encouraged. Labs pending.  -     Bayer DCA Hb A1c Waived -     Anemia Profile B -     Lipid panel -     CMP14+EGFR -     Thyroid Panel With TSH     Continue all  other maintenance medications.  Follow up plan: Return in about 6 months (around 10/26/2023) for CPE.   Continue healthy lifestyle choices, including diet (rich in fruits, vegetables, and lean proteins, and low in salt and simple carbohydrates) and exercise (at least 30 minutes of moderate physical activity daily).  Educational handout given for health maintenance   The above assessment and management plan was discussed with the patient. The patient verbalized understanding of and has agreed to the management plan. Patient is aware to call the clinic if they develop any new symptoms or if symptoms persist or worsen. Patient is aware when to return to the clinic for a follow-up visit. Patient educated on when it is appropriate to go to the emergency department.   Kari Baars, FNP-C [facility] [phone]

## 2023-04-26 ENCOUNTER — Telehealth (HOSPITAL_COMMUNITY): Payer: Medicare HMO | Admitting: Psychiatry

## 2023-04-26 DIAGNOSIS — F411 Generalized anxiety disorder: Secondary | ICD-10-CM | POA: Diagnosis not present

## 2023-04-26 DIAGNOSIS — F331 Major depressive disorder, recurrent, moderate: Secondary | ICD-10-CM | POA: Diagnosis not present

## 2023-04-26 MED ORDER — DULOXETINE HCL 60 MG PO CPEP
60.0000 mg | ORAL_CAPSULE | Freq: Two times a day (BID) | ORAL | 0 refills | Status: DC
Start: 2023-04-26 — End: 2023-07-11
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
## 2023-04-27 ENCOUNTER — Encounter (HOSPITAL_COMMUNITY): Payer: Self-pay | Admitting: Psychiatry

## 2023-05-01 DIAGNOSIS — M1712 Unilateral primary osteoarthritis, left knee: Secondary | ICD-10-CM | POA: Diagnosis not present

## 2023-05-06 ENCOUNTER — Ambulatory Visit (HOSPITAL_COMMUNITY): Payer: Medicare HMO | Admitting: Clinical

## 2023-05-08 DIAGNOSIS — M1712 Unilateral primary osteoarthritis, left knee: Secondary | ICD-10-CM | POA: Diagnosis not present

## 2023-05-09 DIAGNOSIS — M25562 Pain in left knee: Secondary | ICD-10-CM | POA: Diagnosis not present

## 2023-05-13 ENCOUNTER — Ambulatory Visit (INDEPENDENT_AMBULATORY_CARE_PROVIDER_SITE_OTHER): Payer: Medicare HMO

## 2023-05-13 DIAGNOSIS — Z0000 Encounter for general adult medical examination without abnormal findings: Secondary | ICD-10-CM | POA: Diagnosis not present

## 2023-05-13 NOTE — Progress Notes (Signed)
Subjective:   Julia [name] is a 62 y.o. female who presents for Medicare Annual (Subsequent) preventive examination.  Visit Complete: Virtual  I connected with  Julia [name] on 05/13/23 by a audio enabled telemedicine application and verified that I am speaking with the correct person using two identifiers.  Patient Location: Home  Provider Location: Office/Clinic  I discussed the limitations of evaluation and management by telemedicine. The patient expressed understanding and agreed to proceed.  Patient Medicare AWV questionnaire was completed by the patient on 05/13/2023; I have confirmed that all information answered by patient is correct and no changes since this date.  Vital Signs: Unable to obtain new vitals due to this being a telehealth visit.  Review of Systems     Cardiac Risk Factors include: dyslipidemia     Objective:    Today's Vitals   05/13/23 0811  PainSc: 6    There is no height or weight on file to calculate BMI.     05/13/2023    8:20 AM 05/09/2022    9:05 AM 05/08/2021    9:14 AM 04/05/2021   12:35 PM 05/22/2020    5:33 PM 05/20/2020    8:00 AM 11/23/2018    9:14 PM  Advanced Directives  Does Patient Have a Medical Advance Directive? No No No No  No No  Would patient like information on creating a medical advance directive?  No - Patient declined No - Patient declined No - Patient declined No - Patient declined  No - Patient declined    Current Medications (verified) Outpatient Encounter Medications as of 05/13/2023  Medication Sig   atorvastatin (LIPITOR) 40 MG tablet Take 1 tablet (40 mg total) by mouth every evening.   buPROPion (WELLBUTRIN XL) 300 MG 24 hr tablet Take 1 tablet (300 mg total) by mouth daily.   Cholecalciferol (VITAMIN D3) 50 MCG (2000 UT) capsule Take 2,000 Units by mouth daily.   DULoxetine (CYMBALTA) 60 MG capsule Take 1 capsule (60 mg total) by mouth 2 (two) times daily.   gabapentin (NEURONTIN) 300 MG capsule Take 1  capsule (300 mg total) by mouth 2 (two) times daily.   meloxicam (MOBIC) 15 MG tablet Take 1 tablet (15 mg total) by mouth daily.   methocarbamol (ROBAXIN) 500 MG tablet Take 1 tablet (500 mg total) by mouth every 8 (eight) hours as needed for muscle spasms.   traZODone (DESYREL) 50 MG tablet Take 0.5 tablets (25 mg total) by mouth at bedtime.   Probiotic Product (PROBIOTIC DAILY PO) Take 1 tablet by mouth daily. (Patient not taking: Reported on 05/13/2023)   No facility-administered encounter medications on file as of 05/13/2023.    Allergies (verified) Lamotrigine   History: Past Medical History:  Diagnosis Date   Anxiety    Arthritis    Complication of anesthesia    slow to wake up-does not take much medicine to sedate her   Depression    Esophageal reflux    Hyperlipidemia    Low ferritin 02/10/2021   Paget disease of bone    Pneumonia due to COVID-19 virus 05/20/2020   Vitamin B12 deficiency 02/10/2021   Vitamin D insufficiency 02/10/2021   Wears glasses    Past Surgical History:  Procedure Laterality Date   ANTERIOR FUSION CERVICAL SPINE  10/2019   C3-C5   BREAST CYST EXCISION Left    CERVICAL SPINE SURGERY  10/2011   CESAREAN SECTION     DILATION AND CURETTAGE OF UTERUS  1986  KNEE ARTHROSCOPY Left 07/22/2013   Procedure: LEFT KNEE ARTHROSCOPY WITH DEBRIDEMENT CHONDROPLASTY, REMOVAL LOOSE BODIES, PARTIAL MEDIAL MENISCECTOMY, OPEN EXCISION OF PES GANGLION;  Surgeon: [name], MD;  Location: [facility];  Service: Orthopedics;  Laterality: Left;  NO SPECIMEN SENT PER DR. [name] ORDER.   KNEE SURGERY Left 11/21/2021   SPINE SURGERY  10/26/2019   TOTAL ABDOMINAL HYSTERECTOMY  05/2010   TAH   TUBAL LIGATION  1989   Family History  Problem Relation Age of Onset   Thyroid disease Mother    Congestive Heart Failure Mother    Heart disease Mother    Heart disease Father    Lung cancer Sister    Melanoma Sister    Cancer Sister    Depression Sister     Breast cancer Maternal Aunt    Healthy Brother    Healthy Brother    Healthy Brother    Healthy Brother    Social History   Socioeconomic History   Marital status: Significant Other    Spouse name: Julia [name]   Number of children: 3   Years of education: Not on file   Highest education level: 12th grade  Occupational History   Occupation: disability  Tobacco Use   Smoking status: Former    Current packs/day: 0.00    Average packs/day: 0.2 packs/day for 5.0 years (0.8 ttl pk-yrs)    Types: Cigarettes    Start date: 01/03/1980    Quit date: 01/02/1985    Years since quitting: 38.3   Smokeless tobacco: Never   Tobacco comments:    1 pack per week per pt.   Vaping Use   Vaping status: Never Used  Substance and Sexual Activity   Alcohol use: Not Currently    Alcohol/week: 1.0 standard drink of alcohol    Types: 1 Glasses of wine per week    Comment: occassional   Drug use: No   Sexual activity: Not Currently    Birth control/protection: Abstinence  Other Topics Concern   Not on file  Social History Narrative   2 sons live nearby, one son in [place]    05/09/2022 - Has 3 grandchildren - one almost a year old   Social Determinants of Health   Financial Resource Strain: Low Risk  (05/13/2023)   Overall Financial Resource Strain (CARDIA)    Difficulty of Paying Living Expenses: Not very hard  Food Insecurity: No Food Insecurity (05/13/2023)   Hunger Vital Sign    Worried About Running Out of Food in the Last Year: Never true    Ran Out of Food in the Last Year: Never true  Transportation Needs: No Transportation Needs (05/13/2023)   PRAPARE - Administrator, Civil Service (Medical): No    Lack of Transportation (Non-Medical): No  Physical Activity: Inactive (05/13/2023)   Exercise Vital Sign    Days of Exercise per Week: 0 days    Minutes of Exercise per Session: 0 min  Stress: Stress Concern Present (05/13/2023)   [employer] of Occupational Health -  Occupational Stress Questionnaire    Feeling of Stress : Very much  Social Connections: Socially Isolated (05/13/2023)   Social Connection and Isolation Panel [NHANES]    Frequency of Communication with Friends and Family: Once a week    Frequency of Social Gatherings with Friends and Family: Once a week    Attends Religious Services: Never    Database administrator or Organizations: No    Attends [employer]  or Organization Meetings: Never    Marital Status: Divorced    Tobacco Counseling Counseling given: Not Answered Tobacco comments: 1 pack per week per pt.    Clinical Intake:  Pre-visit preparation completed: Yes  Pain : 0-10 Pain Score: 6  Pain Type: Chronic pain Pain Location: Knee Pain Orientation: Left Pain Descriptors / Indicators: Aching Pain Onset: More than a month ago Pain Frequency: Constant     Nutritional Risks: None Diabetes: No  How often do you need to have someone help you when you read instructions, pamphlets, or other written materials from your doctor or pharmacy?: 1 - Never  Interpreter Needed?: No  Information entered by :: NAllen LPN   Activities of Daily Living    05/13/2023    3:01 AM  In your present state of health, do you have any difficulty performing the following activities:  Hearing? 0  Vision? 0  Difficulty concentrating or making decisions? 1  Comment trouble concentrating and memory  Walking or climbing stairs? 1  Comment due to knee  Dressing or bathing? 1  Comment takes time  Doing errands, shopping? 1  Comment fiance runs errands usually, but can go on her own sometimes  Preparing Food and eating ? N  Using the Toilet? N  In the past six months, have you accidently leaked urine? Y  Do you have problems with loss of bowel control? N  Managing your Medications? N  Managing your Finances? N  Housekeeping or managing your Housekeeping? Y    Patient Care Team: Sonny Masters, FNP as PCP - General (Family Medicine) Gardiner Sleeper (Psychiatry) Powers, Patrick North, MD as Referring Physician (Neurosurgery) Donnetta Hail, MD as Consulting Physician (Rheumatology) Nahser, Deloris Ping, MD as Consulting Physician (Cardiology) Luciano Cutter, MD as Consulting Physician (Pulmonary Disease) Michaelle Copas, MD as Referring Physician (Optometry)  Indicate any recent Medical Services you may have received from other than [facility] providers in the past year (date may be approximate).     Assessment:   This is a routine wellness examination for [place].  Hearing/Vision screen Hearing Screening - Comments:: Denies hearing issues Vision Screening - Comments:: Regular eye exams, [facility]  Dietary issues and exercise activities discussed:     Goals Addressed             This Visit's Progress    Patient Stated       05/13/2023, wants to lose more weight       Depression Screen    05/13/2023    8:21 AM 04/25/2023   10:22 AM 11/23/2022   11:16 AM 08/02/2022    1:44 PM 07/03/2022    9:11 AM 05/09/2022    9:07 AM 12/28/2021    8:03 AM  PHQ 2/9 Scores  PHQ - 2 Score 2 6   6 6 4   PHQ- 9 Score 15 19   20 20 16      Information is confidential and restricted. Go to Review Flowsheets to unlock data.    Fall Risk    05/13/2023    3:01 AM 04/25/2023   10:22 AM 07/03/2022    9:12 AM 05/09/2022    9:02 AM 12/28/2021    8:03 AM  Fall Risk   Falls in the past year? 0 0 1 1 1   Comment    fell March 31, 2022 - tripped over a plant on porch while sweeping   Number falls in past yr: 0  0 1 0  Injury with  Fall? 0  1 0 0  Risk for fall due to : Medication side effect;Impaired mobility  Impaired balance/gait;Orthopedic patient;History of fall(s) History of fall(s);Orthopedic patient;Impaired balance/gait   Follow up Falls prevention discussed;Falls evaluation completed  Education provided;Falls prevention discussed Education provided;Falls prevention discussed Falls prevention discussed    MEDICARE RISK AT HOME:  Medicare  Risk at Home - 05/13/23 0820     Any stairs in or around the home? Yes    If so, are there any without handrails? No    Home free of loose throw rugs in walkways, pet beds, electrical cords, etc? Yes    Adequate lighting in your home to reduce risk of falls? Yes    Life alert? No    Use of a cane, walker or w/c? No    Grab bars in the bathroom? Yes    Shower chair or bench in shower? Yes    Elevated toilet seat or a handicapped toilet? No             TIMED UP AND GO:  Was the test performed?  No    Cognitive Function:    05/03/2020    9:11 AM  MMSE - Mini Mental State Exam  Orientation to time 5  Orientation to Place 5  Registration 3  Attention/ Calculation 5  Recall 2  Language- name 2 objects 2  Language- repeat 1  Language- follow 3 step command 3  Language- read & follow direction 1  Write a sentence 1  Copy design 1  Total score 29        05/13/2023    8:22 AM 05/09/2022    9:14 AM 05/08/2021    9:16 AM  6CIT Screen  What Year? 0 points 0 points 0 points  What month? 0 points 0 points 0 points  What time? 0 points 0 points 0 points  Count back from 20 0 points 0 points 0 points  Months in reverse 0 points 0 points 0 points  Repeat phrase 0 points 0 points 4 points  Total Score 0 points 0 points 4 points    Immunizations Immunization History  Administered Date(s) Administered   Influenza Split 06/22/2015, 07/03/2016   Influenza,inj,Quad PF,6+ Mos 06/25/2019, 06/28/2020, 06/22/2021, 07/03/2022   Influenza,trivalent, recombinat, inj, PF 07/16/2017   Tdap 11/11/2013   Zoster Recombinant(Shingrix) 06/25/2019, 12/11/2019    TDAP status: Up to date  Flu Vaccine status: Due, Education has been provided regarding the importance of this vaccine. Advised may receive this vaccine at local pharmacy or Health Dept. Aware to provide a copy of the vaccination record if obtained from local pharmacy or Health Dept. Verbalized acceptance and  understanding.  Pneumococcal vaccine status: Up to date  Covid-19 vaccine status: Declined, Education has been provided regarding the importance of this vaccine but patient still declined. Advised may receive this vaccine at local pharmacy or Health Dept.or vaccine clinic. Aware to provide a copy of the vaccination record if obtained from local pharmacy or Health Dept. Verbalized acceptance and understanding.  Qualifies for Shingles Vaccine? Yes   Zostavax completed Yes   Shingrix Completed?: Yes  Screening Tests Health Maintenance  Topic Date Due   INFLUENZA VACCINE  05/02/2023   DTaP/Tdap/Td (2 - Td or Tdap) 11/12/2023   Medicare Annual Wellness (AWV)  05/12/2024   MAMMOGRAM  07/30/2024   DEXA SCAN  12/22/2024   Colonoscopy  09/08/2029   Hepatitis C Screening  Completed   HIV Screening  Completed   Zoster Vaccines-  Shingrix  Completed   HPV VACCINES  Aged Out   COVID-19 Vaccine  Discontinued    Health Maintenance  Health Maintenance Due  Topic Date Due   INFLUENZA VACCINE  05/02/2023    Colorectal cancer screening: Type of screening: Colonoscopy. Completed 09/09/2019. Repeat every 10 years  Mammogram status: Completed 07/30/2022. Repeat every year  Bone Density status: Completed 12/23/2019.  Lung Cancer Screening: (Low Dose CT Chest recommended if Age 40-80 years, 20 pack-year currently smoking OR have quit w/in 15years.) does not qualify.   Lung Cancer Screening Referral: no  Additional Screening:  Hepatitis C Screening: does qualify; Completed 06/25/2019  Vision Screening: Recommended annual ophthalmology exams for early detection of glaucoma and other disorders of the eye. Is the patient up to date with their annual eye exam?  Yes  Who is the provider or what is the name of the office in which the patient attends annual eye exams? [facility] If pt is not established with a provider, would they like to be referred to a provider to establish care? No .   Dental  Screening: Recommended annual dental exams for proper oral hygiene  Diabetic Foot Exam: n/a  Community Resource Referral / Chronic Care Management: CRR required this visit?  No   CCM required this visit?  No     Plan:     I have personally reviewed and noted the following in the patient's chart:   Medical and social history Use of alcohol, tobacco or illicit drugs  Current medications and supplements including opioid prescriptions. Patient is not currently taking opioid prescriptions. Functional ability and status Nutritional status Physical activity Advanced directives List of other physicians Hospitalizations, surgeries, and ER visits in previous 12 months Vitals Screenings to include cognitive, depression, and falls Referrals and appointments  In addition, I have reviewed and discussed with patient certain preventive protocols, quality metrics, and best practice recommendations. A written personalized care plan for preventive services as well as general preventive health recommendations were provided to patient.     Barb Merino, LPN   1/61/0960   After Visit Summary: (MyChart) Due to this being a telephonic visit, the after visit summary with patients personalized plan was offered to patient via MyChart   Nurse Notes: none

## 2023-05-13 NOTE — Patient Instructions (Signed)
Julia [name] , Thank you for taking time to come for your Medicare Wellness Visit. I appreciate your ongoing commitment to your health goals. Please review the following plan we discussed and let me know if I can assist you in the future.   Referrals/Orders/Follow-Ups/Clinician Recommendations: none  This is a list of the screening recommended for you and due dates:  Health Maintenance  Topic Date Due   Flu Shot  05/02/2023   DTaP/Tdap/Td vaccine (2 - Td or Tdap) 11/12/2023   Medicare Annual Wellness Visit  05/12/2024   Mammogram  07/30/2024   DEXA scan (bone density measurement)  12/22/2024   Colon Cancer Screening  09/08/2029   Hepatitis C Screening  Completed   HIV Screening  Completed   Zoster (Shingles) Vaccine  Completed   HPV Vaccine  Aged Out   COVID-19 Vaccine  Discontinued    Advanced directives: (ACP Link)Information on Advanced Care Planning can be found at [facility] Advance Health Care Directives Advance Health Care Directives ([email or web address]   Next Medicare Annual Wellness Visit scheduled for next year: Yes  Preventive Care 40-64 Years, Female Preventive care refers to lifestyle choices and visits with your health care provider that can promote health and wellness. What does preventive care include? A yearly physical exam. This is also called an annual well check. Dental exams once or twice a year. Routine eye exams. Ask your health care provider how often you should have your eyes checked. Personal lifestyle choices, including: Daily care of your teeth and gums. Regular physical activity. Eating a healthy diet. Avoiding tobacco and drug use. Limiting alcohol use. Practicing safe sex. Taking low-dose aspirin daily starting at age 35. Taking vitamin and mineral supplements as recommended by your health care provider. What happens during an annual well check? The services and screenings done by your health care provider during your annual well  check will depend on your age, overall health, lifestyle risk factors, and family history of disease. Counseling  Your health care provider may ask you questions about your: Alcohol use. Tobacco use. Drug use. Emotional well-being. Home and relationship well-being. Sexual activity. Eating habits. Work and work Astronomer. Method of birth control. Menstrual cycle. Pregnancy history. Screening  You may have the following tests or measurements: Height, weight, and BMI. Blood pressure. Lipid and cholesterol levels. These may be checked every 5 years, or more frequently if you are over 16 years old. Skin check. Lung cancer screening. You may have this screening every year starting at age 77 if you have a 30-pack-year history of smoking and currently smoke or have quit within the past 15 years. Fecal occult blood test (FOBT) of the stool. You may have this test every year starting at age 49. Flexible sigmoidoscopy or colonoscopy. You may have a sigmoidoscopy every 5 years or a colonoscopy every 10 years starting at age 2. Hepatitis C blood test. Hepatitis B blood test. Sexually transmitted disease (STD) testing. Diabetes screening. This is done by checking your blood sugar (glucose) after you have not eaten for a while (fasting). You may have this done every 1-3 years. Mammogram. This may be done every 1-2 years. Talk to your health care provider about when you should start having regular mammograms. This may depend on whether you have a family history of breast cancer. BRCA-related cancer screening. This may be done if you have a family history of breast, ovarian, tubal, or peritoneal cancers. Pelvic exam and Pap test. This may be done every 3  years starting at age 74. Starting at age 34, this may be done every 5 years if you have a Pap test in combination with an HPV test. Bone density scan. This is done to screen for osteoporosis. You may have this scan if you are at high risk for  osteoporosis. Discuss your test results, treatment options, and if necessary, the need for more tests with your health care provider. Vaccines  Your health care provider may recommend certain vaccines, such as: Influenza vaccine. This is recommended every year. Tetanus, diphtheria, and acellular pertussis (Tdap, Td) vaccine. You may need a Td booster every 10 years. Zoster vaccine. You may need this after age 68. Pneumococcal 13-valent conjugate (PCV13) vaccine. You may need this if you have certain conditions and were not previously vaccinated. Pneumococcal polysaccharide (PPSV23) vaccine. You may need one or two doses if you smoke cigarettes or if you have certain conditions. Talk to your health care provider about which screenings and vaccines you need and how often you need them. This information is not intended to replace advice given to you by your health care provider. Make sure you discuss any questions you have with your health care provider. Document Released: 10/14/2015 Document Revised: 06/06/2016 Document Reviewed: 07/19/2015 Elsevier Interactive Patient Education  2017 [employer].    Fall Prevention in the Home Falls can cause injuries. They can happen to people of all ages. There are many things you can do to make your home safe and to help prevent falls. What can I do on the outside of my home? Regularly fix the edges of walkways and driveways and fix any cracks. Remove anything that might make you trip as you walk through a door, such as a raised step or threshold. Trim any bushes or trees on the path to your home. Use bright outdoor lighting. Clear any walking paths of anything that might make someone trip, such as rocks or tools. Regularly check to see if handrails are loose or broken. Make sure that both sides of any steps have handrails. Any raised decks and porches should have guardrails on the edges. Have any leaves, snow, or ice cleared regularly. Use sand or  salt on walking paths during winter. Clean up any spills in your garage right away. This includes oil or grease spills. What can I do in the bathroom? Use night lights. Install grab bars by the toilet and in the tub and shower. Do not use towel bars as grab bars. Use non-skid mats or decals in the tub or shower. If you need to sit down in the shower, use a plastic, non-slip stool. Keep the floor dry. Clean up any water that spills on the floor as soon as it happens. Remove soap buildup in the tub or shower regularly. Attach bath mats securely with double-sided non-slip rug tape. Do not have throw rugs and other things on the floor that can make you trip. What can I do in the bedroom? Use night lights. Make sure that you have a light by your bed that is easy to reach. Do not use any sheets or blankets that are too big for your bed. They should not hang down onto the floor. Have a firm chair that has side arms. You can use this for support while you get dressed. Do not have throw rugs and other things on the floor that can make you trip. What can I do in the kitchen? Clean up any spills right away. Avoid walking on wet floors.  Keep items that you use a lot in easy-to-reach places. If you need to reach something above you, use a strong step stool that has a grab bar. Keep electrical cords out of the way. Do not use floor polish or wax that makes floors slippery. If you must use wax, use non-skid floor wax. Do not have throw rugs and other things on the floor that can make you trip. What can I do with my stairs? Do not leave any items on the stairs. Make sure that there are handrails on both sides of the stairs and use them. Fix handrails that are broken or loose. Make sure that handrails are as long as the stairways. Check any carpeting to make sure that it is firmly attached to the stairs. Fix any carpet that is loose or worn. Avoid having throw rugs at the top or bottom of the stairs. If  you do have throw rugs, attach them to the floor with carpet tape. Make sure that you have a light switch at the top of the stairs and the bottom of the stairs. If you do not have them, ask someone to add them for you. What else can I do to help prevent falls? Wear shoes that: Do not have high heels. Have rubber bottoms. Are comfortable and fit you well. Are closed at the toe. Do not wear sandals. If you use a stepladder: Make sure that it is fully opened. Do not climb a closed stepladder. Make sure that both sides of the stepladder are locked into place. Ask someone to hold it for you, if possible. Clearly mark and make sure that you can see: Any grab bars or handrails. First and last steps. Where the edge of each step is. Use tools that help you move around (mobility aids) if they are needed. These include: Canes. Walkers. Scooters. Crutches. Turn on the lights when you go into a dark area. Replace any light bulbs as soon as they burn out. Set up your furniture so you have a clear path. Avoid moving your furniture around. If any of your floors are uneven, fix them. If there are any pets around you, be aware of where they are. Review your medicines with your doctor. Some medicines can make you feel dizzy. This can increase your chance of falling. Ask your doctor what other things that you can do to help prevent falls. This information is not intended to replace advice given to you by your health care provider. Make sure you discuss any questions you have with your health care provider. Document Released: 07/14/2009 Document Revised: 02/23/2016 Document Reviewed: 10/22/2014 Elsevier Interactive Patient Education  2017 [employer].

## 2023-05-27 ENCOUNTER — Ambulatory Visit (HOSPITAL_COMMUNITY): Payer: Medicare HMO | Admitting: Clinical

## 2023-05-27 ENCOUNTER — Encounter (HOSPITAL_COMMUNITY): Payer: Self-pay | Admitting: Clinical

## 2023-05-27 DIAGNOSIS — F331 Major depressive disorder, recurrent, moderate: Secondary | ICD-10-CM

## 2023-05-27 DIAGNOSIS — F411 Generalized anxiety disorder: Secondary | ICD-10-CM

## 2023-05-27 NOTE — Progress Notes (Unsigned)
THERAPIST PROGRESS NOTE  Session Time: 10:01-11:00am  Session #9  Participation Level: Active  Behavioral Response: Casual Alert Negative and Euthymic  Type of Therapy: Individual Therapy  Treatment Goals addressed:  LTG: Julia [name] will score less than 5 on the Generalized Anxiety Disorder 7 Scale (GAD-7)  STG: Julia [name] will reduce frequency of avoidant behaviors by 50% as evidenced by self-report in therapy sessions  LTG: Reduce frequency, intensity, and duration of depression symptoms so that daily functioning is improved   STG: Julia [name] will score less than 9 on the Patient Health Questionnaire (PHQ-9)    STG: Julia [name] will practice behavioral activation skills 2 times per week for the next 26 weeks  ProgressTowards Goals: Progressing  Interventions: Supportive, Reframing, and Other: control vs. no control  Summary: Julia [name] is a 62 y.o. female who presents with anxiety and depression for therapy. She reported her mood has been going up and down and her sleep has "not been great."  She often does not take her sleep medication and, for instance, did not take it last night because of this morning appointment which she was afraid she would miss otherwise.  She was walking more fluidly and with less obvious pain.  She reported having completed all 3 injections in her knee, but it has now been discovered that she has a bone spur in the knee that is aggravating the pain.  She believes the injections have helped somewhat.    Patient remains hyperfocused on details of events in her life, and it was noted today that she also has significant difficulty with remaining on one topic, even one that she herself has chosen.  CSW took an observer role and noted that she went from one topic to another to another, distracting herself each time.  She did report during the session that she is trying to speak more positively to her partner and this does seem to be helping their relationship although she continued  to complain about him.  He is doing more that she asks him to do, which CSW pointed out is a positive and she did agree with.  It was not particularly effective trying to talk with the patient about focusing on what is in her locus of control and trying to focus less on what is not within her control.  She tends to revert to complaints about partner or some of his relatives.    CSW asked about her goals reported at last session - (1) to stop getting so frustrated with partner and (2) be able to do what she could "before."  For the first she does she feel she is making progress as noted above.  For the second, she does not feel like she is making progress.  She has had 3 injections in her knee, then has had to rest completely the day after each of those times.  She went without some of her medicines possibly due to some miscommunications at PCP office. She has had no problem refilling her psychiatric medications and stated she is filling up her medication box regularly and is "not forgetting as much."    We talked about gratitude again, with little result.  CSW assigned her the task of coming up with a list of things she does control.  Her immediate response was, "me."  CSW encouraged her to think deeper about the question.  Suicidal/Homicidal: No  Therapist Response:  Patient is progressing AEB engaging in scheduled therapy session.  she presented oriented x5 and stated she  was feeling "up and down."  CSW evaluated patient's medication compliance and self-care since last session.  CSW reviewed the last session with patient who reported she has had 3 knee injections since then and is "doing a little better."  Patient stated she has been trying to be more positive with her partner.  CSW noted that patient distracts herself quite easily and moves from one topic to another to another.  It is possible this contributes to her inability to feel she completes tasks.  Throughout the session, CSW gave patient the  opportunity to explore thoughts and feelings associated with current life situations and past/present external stressors.   CSW encouraged patient's expression of feelings and validated patient's thoughts using empathy, active listening, open body language, and unconditional positive regard.        Recommendations:  Return to therapy in 3 weeks, engage in self care behaviors, work on identifying those things in her life that she actually is in control of versus what she does not have control of  Plan: Return again in 3 weeks. Next appointment:  9/16  Diagnosis:  Moderate episode of recurrent major depressive disorder (HCC)  GAD (generalized anxiety disorder)  Collaboration of Care: Psychiatrist AEB - doctor can read therapy notes , and vice versa  Patient/Guardian was advised Release of Information must be obtained prior to any record release in order to collaborate their care with an outside provider. Patient/Guardian was advised if they have not already done so to contact the registration department to sign all necessary forms in order for [place] to release information regarding their care.   Consent: Patient/Guardian gives verbal consent for treatment and assignment of benefits for services provided during this visit. Patient/Guardian expressed understanding and agreed to proceed.   Lynnell Chad, LCSW 05/27/2023

## 2023-06-17 ENCOUNTER — Encounter (HOSPITAL_COMMUNITY): Payer: Self-pay | Admitting: Clinical

## 2023-06-17 ENCOUNTER — Ambulatory Visit (HOSPITAL_COMMUNITY): Payer: Medicare HMO | Admitting: Clinical

## 2023-06-17 DIAGNOSIS — F411 Generalized anxiety disorder: Secondary | ICD-10-CM

## 2023-06-17 DIAGNOSIS — F331 Major depressive disorder, recurrent, moderate: Secondary | ICD-10-CM | POA: Diagnosis not present

## 2023-06-17 NOTE — Progress Notes (Unsigned)
THERAPIST PROGRESS NOTE  Session Time: 10:01-11:01am  Session #10  Participation Level: Active  Behavioral Response: Casual Alert Negative, Dysphoric, Irritable, and Tearful  Type of Therapy: Individual Therapy  Treatment Goals addressed:  LTG: Luigina will score less than 5 on the Generalized Anxiety Disorder 7 Scale (GAD-7)  STG: [place] will reduce frequency of avoidant behaviors by 50% as evidenced by self-report in therapy sessions  LTG: Reduce frequency, intensity, and duration of depression symptoms so that daily functioning is improved   STG: Tantania will score less than 9 on the Patient Health Questionnaire (PHQ-9)    STG: Sarena will practice behavioral activation skills 2 times per week for the next 26 weeks  ProgressTowards Goals: Progressing  Interventions: Solution Focused and Supportive  Summary: CIANNA WAKER is a 62 y.o. female who presents with anxiety and depression for therapy. She was obviously in pain from her knee/hip and stated she would see her PCP in 3 days for the first time since having 3 injections into knee and ultrasound, stated perhaps they will draw the fluid off her knee to help with the pain.  She was focused on the negative during session, stating that she was just coming off a "not good" weekend with her partner which started on Friday when he told he was going to a concert that he had known about for 2 months but neglected to tell her earlier.  Also on Friday she kept her 2yo granddaughter who misbehaved quite a bit and was difficult to manage. She expressed anger at her partner/husband for a number of things including not telling her thank you for giving granddaughter's things to his pregnant daughter, not taking off his dirty shoes after she cleaned the house, sitting with her at family functions but not wanting to, not buying her a ticket to the concert, not putting up 2-4x outside that she asked him to, needing to talk all the time, not caring, expecting  her to decorate for various occasions such as upcoming Halloween, and not calling her all day while he is at work unless he needs something.  She stated because of some of the things listed, she has told him to go to all future family affairs involving his family by himself, that she will no longer go with him.  He stated that she's nitpicking everything and swears he cannot remember things that she asks him to do, but she insists that he remembers what anybody else says. CSW attempted to ascertain whether she could identify any cognitive distortions about any of the things she was thinking in relation to this list, but this was not successful.  CSW talked again about approaching him with "I" statements, but she feels she has already tried this.  Motivational Interviewing and going back to basics on CBT may be helpful in future sessions.  When asked what she thinks she wants to do about the situation, she stated that she has thought about leaving but does not know where she would go.  She does not have a vehicle, since all the vehicles even the one she drives are in his name.  She stated that what she sees for 1 year in the future is that she is probably by herself and somewhat happy because she is out of the situation.  She also recognizes that she will not have enough money with her own disability check and will likely be struggling.  Suicidal/Homicidal: No  Therapist Response:  Patient is progressing AEB engaging in scheduled therapy  session.  She presented oriented x5 and stated she was feeling "in pain, irritable, crying a lot."  Patient was angry and irritable throughout the session, with complaints about her husband that were varied and consistent with past reports.  She is not able to share how much she has tried to use past taught techniques such as "I" statements and she is not able to see any potential for cognitive distortions in the way she is currently thinking about him as she is too angry and  distraught.  Throughout the session, CSW gave patient the opportunity to explore thoughts and feelings associated with current life situations and past/present external stressors.   CSW encouraged patient's expression of feelings and validated patient's thoughts using empathy, active listening, open body language, and unconditional positive regard.      Recommendations:  Return to therapy in 4 weeks, engage in self care behaviors, work on identifying those things in her life that she actually is in control of versus what she does not have control of   Plan: Return again in 4 weeks. Next appointment:  10/14  Diagnosis:  Moderate episode of recurrent major depressive disorder (HCC)  GAD (generalized anxiety disorder)  Collaboration of Care: Psychiatrist AEB -  doctor and therapist can read notes as needed  Patient/Guardian was advised Release of Information must be obtained prior to any record release in order to collaborate their care with an outside provider. Patient/Guardian was advised if they have not already done so to contact the registration department to sign all necessary forms in order for [place] to release information regarding their care.   Consent: Patient/Guardian gives verbal consent for treatment and assignment of benefits for services provided during this visit. Patient/Guardian expressed understanding and agreed to proceed.   Lynnell Chad, LCSW 06/17/2023

## 2023-06-20 DIAGNOSIS — M1712 Unilateral primary osteoarthritis, left knee: Secondary | ICD-10-CM | POA: Diagnosis not present

## 2023-07-08 DIAGNOSIS — H5203 Hypermetropia, bilateral: Secondary | ICD-10-CM | POA: Diagnosis not present

## 2023-07-08 DIAGNOSIS — H47323 Drusen of optic disc, bilateral: Secondary | ICD-10-CM | POA: Diagnosis not present

## 2023-07-08 DIAGNOSIS — H02882 Meibomian gland dysfunction right lower eyelid: Secondary | ICD-10-CM | POA: Diagnosis not present

## 2023-07-08 DIAGNOSIS — H524 Presbyopia: Secondary | ICD-10-CM | POA: Diagnosis not present

## 2023-07-08 DIAGNOSIS — H2513 Age-related nuclear cataract, bilateral: Secondary | ICD-10-CM | POA: Diagnosis not present

## 2023-07-08 DIAGNOSIS — H02885 Meibomian gland dysfunction left lower eyelid: Secondary | ICD-10-CM | POA: Diagnosis not present
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
## 2023-07-11 ENCOUNTER — Telehealth (HOSPITAL_COMMUNITY): Payer: Medicare HMO | Admitting: Psychiatry

## 2023-07-11 ENCOUNTER — Encounter (HOSPITAL_COMMUNITY): Payer: Self-pay | Admitting: Psychiatry

## 2023-07-11 DIAGNOSIS — F331 Major depressive disorder, recurrent, moderate: Secondary | ICD-10-CM | POA: Diagnosis not present

## 2023-07-11 DIAGNOSIS — F411 Generalized anxiety disorder: Secondary | ICD-10-CM

## 2023-07-11 DIAGNOSIS — E559 Vitamin D deficiency, unspecified: Secondary | ICD-10-CM

## 2023-07-11 MED ORDER — DULOXETINE HCL 60 MG PO CPEP
60.0000 mg | ORAL_CAPSULE | Freq: Two times a day (BID) | ORAL | 0 refills | Status: DC
Start: 2023-07-11 — End: 2023-11-01

## 2023-07-15 ENCOUNTER — Ambulatory Visit (HOSPITAL_COMMUNITY): Payer: Medicare HMO | Admitting: Clinical

## 2023-07-15 ENCOUNTER — Encounter (HOSPITAL_COMMUNITY): Payer: Self-pay | Admitting: Clinical

## 2023-07-15 DIAGNOSIS — F331 Major depressive disorder, recurrent, moderate: Secondary | ICD-10-CM

## 2023-07-15 DIAGNOSIS — H40013 Open angle with borderline findings, low risk, bilateral: Secondary | ICD-10-CM | POA: Diagnosis not present

## 2023-07-15 DIAGNOSIS — F411 Generalized anxiety disorder: Secondary | ICD-10-CM

## 2023-07-15 DIAGNOSIS — H47323 Drusen of optic disc, bilateral: Secondary | ICD-10-CM | POA: Diagnosis not present

## 2023-07-15 NOTE — Progress Notes (Unsigned)
THERAPIST PROGRESS NOTE  Session Time: 4:00-5:00pm  Session #11  Participation Level: Active  Behavioral Response: Casual Alert Anxious and Euthymic  Type of Therapy: Individual Therapy  Treatment Goals addressed:  New Treatment Plan needed:  ***  LTG: Kristalynn will score less than 5 on the Generalized Anxiety Disorder 7 Scale (GAD-7)  STG: Kenzi will reduce frequency of avoidant behaviors by 50% as evidenced by self-report in therapy sessions  LTG: Reduce frequency, intensity, and duration of depression symptoms so that daily functioning is improved   STG: Icesis will score less than 9 on the Patient Health Questionnaire (PHQ-9)    STG: Tashima will practice behavioral activation skills 2 times per week for the next 26 weeks  ProgressTowards Goals: Progressing  Interventions: Solution Focused and Supportive  Summary: LINN CLAVIN is a 62 y.o. female who presents with anxiety and depression for therapy. ***  Suicidal/Homicidal: No  Therapist Response: Patient is progressing AEB engaging in scheduled therapy session.  ***e presented oriented x5 and stated ***he was feeling "***."  CSW evaluated patient's medication compliance, use of coping tools, and self-care, as applicable.  Patient stated ***.  CSW taught ***.  Patient received this willingly and indicated ***comprehension.  CSW assigned patient homework of ***.  CSW encouraged patient to schedule more therapy sessions for the future, as ***.  Throughout the session, CSW gave patient the opportunity to explore thoughts and feelings associated with current life situations and past/present stressors.   CSW challenged patient gently and appropriately to consider different ways of looking at reported issues. CSW encouraged patient's expression of feelings and validated these using empathy, active listening, open body language, and unconditional positive regard.        Recommendations:  Return to therapy in 4 weeks, engage in self care  behaviors, ***  Plan: Return again in 3 weeks.  Next appointment:  11/4  Diagnosis:  Moderate episode of recurrent major depressive disorder (HCC)  GAD (generalized anxiety disorder)  Collaboration of Care: Psychiatrist AEB -  doctor and therapist can read notes as needed  Patient/Guardian was advised Release of Information must be obtained prior to any record release in order to collaborate their care with an outside provider. Patient/Guardian was advised if they have not already done so to contact the registration department to sign all necessary forms in order for [place] to release information regarding their care.   Consent: Patient/Guardian gives verbal consent for treatment and assignment of benefits for services provided during this visit. Patient/Guardian expressed understanding and agreed to proceed.   Lynnell Chad, LCSW 07/15/2023

## 2023-07-19 ENCOUNTER — Telehealth: Payer: Self-pay | Admitting: Family Medicine

## 2023-08-05 ENCOUNTER — Ambulatory Visit
Admission: RE | Admit: 2023-08-05 | Discharge: 2023-08-05 | Disposition: A | Discharge status: Home / Self Care | Payer: Medicare HMO | Source: Ambulatory Visit | Attending: Family Medicine | Admitting: Family Medicine

## 2023-08-05 ENCOUNTER — Other Ambulatory Visit: Payer: Self-pay | Admitting: Family Medicine

## 2023-08-05 ENCOUNTER — Ambulatory Visit (HOSPITAL_COMMUNITY): Payer: Medicare HMO | Admitting: Clinical

## 2023-08-05 ENCOUNTER — Encounter (HOSPITAL_COMMUNITY): Payer: Self-pay | Admitting: Clinical

## 2023-08-05 DIAGNOSIS — Z1231 Encounter for screening mammogram for malignant neoplasm of breast: Secondary | ICD-10-CM

## 2023-08-05 DIAGNOSIS — F331 Major depressive disorder, recurrent, moderate: Secondary | ICD-10-CM | POA: Diagnosis not present

## 2023-08-05 DIAGNOSIS — F411 Generalized anxiety disorder: Secondary | ICD-10-CM

## 2023-08-05 NOTE — Progress Notes (Unsigned)
THERAPIST PROGRESS NOTE  Session Time: 2:04-3:00pm  Session #12  Participation Level: Active  Behavioral Response: Casual Alert Euthymic and Irritable  Type of Therapy: Individual Therapy  Treatment Goals addressed:  Goal: LTG: Niema will score less than 5 on the Generalized Anxiety Disorder 7 Scale (GAD-7) Goal: STG: Julia [name] will reduce frequency of avoidant behaviors by 50% as evidenced by self-report in therapy sessions Goal: LTG: Learn breathing techniques and grounding techniques at an age-appropriate level and demonstrate mastery in session then report independent use of these skills out of session. Goal: STG: Learn emotion regulation strategies and practice using them Goal: LTG: Reduce frequency, intensity, and duration of depression symptoms so that daily functioning is improved Goal: STG: Julia [name] will score less than 9 on the Patient Health Questionnaire (PHQ-9) Goal: LTG: Identify and decrease cognitive distortions contributing negatively to mood and behavior by identifying 5-7 cognitive distortions Julia [name] has and learning how to come up with replacement thoughts that are more balanced, realistic, and helpful. Goal: STG: Learn a variety of coping skills and demonstrate the ability to use them to decrease feelings of sadness, anger, and fear and increase feelings of happiness, peace, and powerfulness AEB gauging those emotions on 1-10 scale. Goal: Julia [name] will increase her resilience through application of CBT techniques and through processing of her life in a shame framework Goal: Julia [name] will learn about additional communication techniques, practice these with her partner and family, and report improved communication within these relationships. Goal: LTG: Learn about boundary types, how to implement them, and how to enforce them so that   ProgressTowards Goals: Progressing  Interventions: Solution Focused and Supportive  Summary: Julia [name] is a 62 y.o. female who presents with  anxiety and depression for therapy. She shared that she did not follow through with her homework once again, did not make a list for her husband of things she needs to have done, although she told him she was going to do so and he said that he would welcome that.  We spoke at length about the upcoming holiday Thanksgiving and she did in fact tell her husband, son, and son's girlfriend that she is "not sure" if she can take care of the celebration at home like she usually does.  We processed how that felt and also talked about taking it further to talk to his kids and her other kids to make actual arrangements for something different to be done this year.  She stated her husband never responded when she mentioned it to him so she is sure he is just waiting for her to do everything anyway.  As she made complaints about things she needs him to do, CSW wrote them down on a blank piece of paper which was then given to her at the end of the session.  Throughout the session, interventions provided included:   Working with Cherylann Ratel to identify a minimum of 3 consequences of avoidance of  admitting to family her limitations due to her upcoming knee replacement surgery. Working with Cherylann Ratel to identify a minimum of 3 alternative coping behaviors to avoidance, specific to the holidays coming up. Educating patient on cognitive distortions observed during session Intervention: Perform motivational interviewing regarding actively using any skills taught in session. Helping Reine feel more empowered and content with being able to control her own life where/when possible. Continuing to teach types of boundaries, help [name] identify where boundaries are needed in her life, help her to come up with a plan for implementing and enforcing  boundaries, and provide feedback and encouragement as needed.  Suicidal/Homicidal: No  Therapist Response: Patient is progressing AEB engaging in scheduled therapy session.  She presented oriented  x5 and stated she was feeling "in pain."  CSW evaluated patient's medication compliance, use of coping tools, and self-care, as applicable.   Throughout the session, CSW gave patient the opportunity to explore thoughts and feelings associated with current life situations and past/present stressors.   CSW challenged patient gently and appropriately to consider different ways of looking at reported issues. CSW encouraged patient's expression of feelings and validated these using empathy, active listening, open body language, and unconditional positive regard.      Recommendations:  Return to therapy in 3 weeks, engage in self care behaviors, make a plan for the holidays for herself and let others in the family make their own plans for themselves that do not include her doing all the work  Plan: Return again in 3 weeks, CSW to do PHQ-9 and GAD-7 at next session  Next appointment:  11/25  Diagnosis:  GAD (generalized anxiety disorder)  Moderate episode of recurrent major depressive disorder (HCC)  Collaboration of Care: Psychiatrist AEB -  doctor and therapist can read notes as needed  Patient/Guardian was advised Release of Information must be obtained prior to any record release in order to collaborate their care with an outside provider. Patient/Guardian was advised if they have not already done so to contact the registration department to sign all necessary forms in order for [place] to release information regarding their care.   Consent: Patient/Guardian gives verbal consent for treatment and assignment of benefits for services provided during this visit. Patient/Guardian expressed understanding and agreed to proceed.   Lynnell Chad, LCSW 08/05/2023

## 2023-08-07 ENCOUNTER — Ambulatory Visit (INDEPENDENT_AMBULATORY_CARE_PROVIDER_SITE_OTHER): Payer: Medicare HMO | Admitting: Family Medicine

## 2023-08-07 ENCOUNTER — Encounter: Payer: Self-pay | Admitting: Family Medicine

## 2023-08-07 NOTE — Progress Notes (Signed)
To early for surgical clearance.
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
## 2023-08-16 ENCOUNTER — Encounter (HOSPITAL_COMMUNITY): Payer: Self-pay | Admitting: Psychiatry

## 2023-08-16 ENCOUNTER — Telehealth (HOSPITAL_COMMUNITY): Payer: Medicare HMO | Admitting: Psychiatry

## 2023-08-16 DIAGNOSIS — E559 Vitamin D deficiency, unspecified: Secondary | ICD-10-CM

## 2023-08-16 DIAGNOSIS — F331 Major depressive disorder, recurrent, moderate: Secondary | ICD-10-CM

## 2023-08-16 DIAGNOSIS — F411 Generalized anxiety disorder: Secondary | ICD-10-CM | POA: Diagnosis not present

## 2023-08-16 MED ORDER — BUPROPION HCL ER (XL) 300 MG PO TB24
300.0000 mg | ORAL_TABLET | Freq: Every day | ORAL | 1 refills | Status: DC
Start: 2023-08-16 — End: 2024-01-10

## 2023-08-26 ENCOUNTER — Ambulatory Visit (HOSPITAL_COMMUNITY): Payer: Medicare HMO | Admitting: Clinical

## 2023-08-26 ENCOUNTER — Encounter (HOSPITAL_COMMUNITY): Payer: Self-pay | Admitting: Clinical

## 2023-08-26 DIAGNOSIS — F411 Generalized anxiety disorder: Secondary | ICD-10-CM | POA: Diagnosis not present

## 2023-08-26 DIAGNOSIS — F331 Major depressive disorder, recurrent, moderate: Secondary | ICD-10-CM | POA: Diagnosis not present

## 2023-08-26 NOTE — Progress Notes (Signed)
THERAPIST PROGRESS NOTE  Session Time: 2:04-3:04pm  Session #13  Participation Level: Active  Behavioral Response: Casual Alert Euthymic  Type of Therapy: Individual Therapy  Treatment Goals addressed:  Goal: LTG: [name] will score less than 5 on the Generalized Anxiety Disorder 7 Scale (GAD-7) Goal: STG: Julia [name] will reduce frequency of avoidant behaviors by 50% as evidenced by self-report in therapy sessions Goal: LTG: Learn breathing techniques and grounding techniques at an age-appropriate level and demonstrate mastery in session then report independent use of these skills out of session. Goal: STG: Learn emotion regulation strategies and practice using them Goal: LTG: Reduce frequency, intensity, and duration of depression symptoms so that daily functioning is improved Goal: STG: Julia [name] will score less than 9 on the Patient Health Questionnaire (PHQ-9) Goal: LTG: Identify and decrease cognitive distortions contributing negatively to mood and behavior by identifying 5-7 cognitive distortions Julia [name] has and learning how to come up with replacement thoughts that are more balanced, realistic, and helpful. Goal: STG: Learn a variety of coping skills and demonstrate the ability to use them to decrease feelings of sadness, anger, and fear and increase feelings of happiness, peace, and powerfulness AEB gauging those emotions on 1-10 scale. Goal: Julia [name] will increase her resilience through application of CBT techniques and through processing of her life in a shame framework Goal: Julia [name] will learn about additional communication techniques, practice these with her partner and family, and report improved communication within these relationships. Goal: LTG: Learn about boundary types, how to implement them, and how to enforce them so that   ProgressTowards Goals: Progressing  Interventions: CBT, Assertiveness Training, Supportive, and Other: boundaries with her/his children and about food  Summary:  Julia [name] is a 62 y.o. female who presents with anxiety and depression for therapy. She presented oriented x5 and stated she was feeling "okay."  CSW evaluated patient's medication compliance, use of coping tools, and self-care, as applicable.   We processed throughout the session the ways in which she has spoken up for herself and stated she could not do the Thanksgiving preparations, but her family and significant other would have to do so.  She did give him the list of things needed to be done that was developed during our last session, and she was pleased to say he put it in his planner, gets it out to check sometimes.  She continues to just come behind him and redo things he has done, even though he does not do what she asks in the first place.  CSW spoke with her once again about how this is teaching him the wrong thing, that she could teach him to listen by having him to redo it.  She shared that she is quite frightened about her upcoming knee surgery, especially fearfully that the glue will not be successful with her replacement due to her Paget's disease.  She reported that she is eating too many sweets and we discussed the commonality of this problem among humans, with an emphasis on Overeaters Anonymous.  CSW had her to make a gratitude list which was lengthy and lovely.  Suicidal/Homicidal: No  Therapist Response:  Patient is progressing AEB engaging in scheduled therapy session.  Throughout the session, CSW gave patient the opportunity to explore thoughts and feelings associated with current life situations and past/present stressors.   CSW challenged patient gently and appropriately to consider different ways of looking at reported issues. CSW encouraged patient's expression of feelings and validated these using empathy, active listening, open body  language, and unconditional positive regard.   CSW encouraged patient to schedule more therapy sessions for the future, as needed.     Recommendations:  Return to therapy in 4 weeks, engage in self care behaviors, follow through with the current plan for the holidays of letting others take care of what they have said they would take care of  Plan: Return again in 4 weeks, on 12/23, CSW to do PHQ-9 and GAD-7 at next session  Diagnosis:  GAD (generalized anxiety disorder)  Moderate episode of recurrent major depressive disorder (HCC)  Collaboration of Care: Psychiatrist AEB -   psychiatrist can read therapy notes; therapist can and does read psychiatric notes prior to sessions   Patient/Guardian was advised Release of Information must be obtained prior to any record release in order to collaborate their care with an outside provider. Patient/Guardian was advised if they have not already done so to contact the registration department to sign all necessary forms in order for [place] to release information regarding their care.   Consent: Patient/Guardian gives verbal consent for treatment and assignment of benefits for services provided during this visit. Patient/Guardian expressed understanding and agreed to proceed.   Lynnell Chad, LCSW 08/26/2023

## 2023-08-28 ENCOUNTER — Telehealth: Payer: Self-pay | Admitting: Family Medicine

## 2023-08-28 DIAGNOSIS — R7303 Prediabetes: Secondary | ICD-10-CM

## 2023-08-28 DIAGNOSIS — E559 Vitamin D deficiency, unspecified: Secondary | ICD-10-CM

## 2023-08-28 DIAGNOSIS — E782 Mixed hyperlipidemia: Secondary | ICD-10-CM

## 2023-08-28 NOTE — Telephone Encounter (Signed)
Lmtcb to schedule an apt  Copied from CRM [id number]. Topic: Appointments - Scheduling Inquiry for Clinic >> Aug 27, 2023  4:33 PM Alona Bene A wrote: Reason for CRM: Patient is requesting call from providers office regarding pre-op appointment. Please contact patient at [phone]

## 2023-08-28 NOTE — Telephone Encounter (Signed)
Lab appointment scheduled, labs ordered, patient aware

## 2023-08-28 NOTE — Telephone Encounter (Signed)
Pt is needing a medical clearance done before dec 16th there are no appts available before then she would like a call back to see about being seen sooner

## 2023-08-28 NOTE — Telephone Encounter (Signed)
Pt made appt for dec 12

## 2023-09-09 ENCOUNTER — Other Ambulatory Visit: Payer: Medicare HMO

## 2023-09-09 DIAGNOSIS — E782 Mixed hyperlipidemia: Secondary | ICD-10-CM

## 2023-09-09 DIAGNOSIS — R7303 Prediabetes: Secondary | ICD-10-CM | POA: Diagnosis not present

## 2023-09-09 DIAGNOSIS — E559 Vitamin D deficiency, unspecified: Secondary | ICD-10-CM

## 2023-09-09 LAB — LIPID PANEL

## 2023-09-09 LAB — BAYER DCA HB A1C WAIVED: HB A1C (BAYER DCA - WAIVED): 5.4 % (ref 4.8–5.6)
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
## 2023-09-10 LAB — LIPID PANEL
Cholesterol, Total: 178 mg/dL (ref 100–199)
HDL: 65 mg/dL (ref >39)
LDL CALC COMMENT:: 2.7 ratio (ref 0.0–4.4)
LDL Chol Calc (NIH): 98 mg/dL (ref 0–99)
Triglycerides: 82 mg/dL (ref 0–149)
VLDL Cholesterol Cal: 15 mg/dL (ref 5–40)

## 2023-09-10 LAB — CMP14+EGFR
ALT: 15 [IU]/L (ref 0–32)
AST: 18 [IU]/L (ref 0–40)
Albumin: 4.3 g/dL (ref 3.9–4.9)
Alkaline Phosphatase: 95 [IU]/L (ref 44–121)
BUN/Creatinine Ratio: 15 (ref 12–28)
BUN: 16 mg/dL (ref 8–27)
Bilirubin Total: 0.7 mg/dL (ref 0.0–1.2)
CO2: 25 mmol/L (ref 20–29)
Calcium: 9.5 mg/dL (ref 8.7–10.3)
Chloride: 107 mmol/L — ABNORMAL HIGH (ref 96–106)
Creatinine, Ser: 1.05 mg/dL — ABNORMAL HIGH (ref 0.57–1.00)
Globulin, Total: 2.3 g/dL (ref 1.5–4.5)
Glucose: 107 mg/dL — ABNORMAL HIGH (ref 70–99)
Potassium: 4.7 mmol/L (ref 3.5–5.2)
Sodium: 143 mmol/L (ref 134–144)
Total Protein: 6.6 g/dL (ref 6.0–8.5)
eGFR: 60 mL/min/{1.73_m2} (ref >59)

## 2023-09-10 LAB — CBC WITH DIFFERENTIAL/PLATELET
Basophils Absolute: 0 10*3/uL (ref 0.0–0.2)
Basos: 0 %
EOS (ABSOLUTE): 0.2 10*3/uL (ref 0.0–0.4)
Eos: 3 %
Hematocrit: 42.6 % (ref 34.0–46.6)
Hemoglobin: 13.7 g/dL (ref 11.1–15.9)
Immature Grans (Abs): 0 10*3/uL (ref 0.0–0.1)
Immature Granulocytes: 0 %
Lymphocytes Absolute: 1.3 10*3/uL (ref 0.7–3.1)
Lymphs: 20 %
MCH: 30.5 pg (ref 26.6–33.0)
MCHC: 32.2 g/dL (ref 31.5–35.7)
MCV: 95 fL (ref 79–97)
Monocytes Absolute: 0.5 10*3/uL (ref 0.1–0.9)
Monocytes: 7 %
Neutrophils Absolute: 4.7 10*3/uL (ref 1.4–7.0)
Neutrophils: 70 %
Platelets: 249 10*3/uL (ref 150–450)
RBC: 4.49 x10E6/uL (ref 3.77–5.28)
RDW: 12.4 % (ref 11.7–15.4)
WBC: 6.7 10*3/uL (ref 3.4–10.8)

## 2023-09-10 LAB — VITAMIN D 25 HYDROXY (VIT D DEFICIENCY, FRACTURES): Vit D, 25-Hydroxy: 35 ng/mL (ref 30.0–100.0)

## 2023-09-12 ENCOUNTER — Ambulatory Visit: Payer: Medicare HMO | Admitting: Family Medicine

## 2023-09-12 VITALS — BP 133/88 | HR 82 | Temp 96.9°F | Ht 61.0 in | Wt 161.6 lb

## 2023-09-12 DIAGNOSIS — M1712 Unilateral primary osteoarthritis, left knee: Secondary | ICD-10-CM

## 2023-09-12 DIAGNOSIS — Z01818 Encounter for other preprocedural examination: Secondary | ICD-10-CM | POA: Diagnosis not present

## 2023-09-12 DIAGNOSIS — R3915 Urgency of urination: Secondary | ICD-10-CM | POA: Diagnosis not present

## 2023-09-12 LAB — URINALYSIS, ROUTINE W REFLEX MICROSCOPIC
Bilirubin, UA: NEGATIVE
Glucose, UA: NEGATIVE
Ketones, UA: NEGATIVE
Nitrite, UA: NEGATIVE
Protein,UA: NEGATIVE
RBC, UA: NEGATIVE
Specific Gravity, UA: 1.02 (ref 1.005–1.030)
Urobilinogen, Ur: 0.2 mg/dL (ref 0.2–1.0)
pH, UA: 7 (ref 5.0–7.5)

## 2023-09-12 LAB — MICROSCOPIC EXAMINATION
Renal Epithel, UA: NONE SEEN /[HPF]
Yeast, UA: NONE SEEN

## 2023-09-12 NOTE — Progress Notes (Signed)
Subjective:  Patient ID: Julia [name], female    DOB: May 18, 1961, 62 y.o.   MRN: [id number]  Patient Care Team: Sonny Masters, FNP as PCP - General (Family Medicine) Gardiner Sleeper (Psychiatry) Powers, Patrick North, MD as Referring Physician (Neurosurgery) Donnetta Hail, MD as Consulting Physician (Rheumatology) Nahser, Deloris Ping, MD as Consulting Physician (Cardiology) Luciano Cutter, MD as Consulting Physician (Pulmonary Disease) Michaelle Copas, MD as Referring Physician (Optometry)   Chief Complaint:  surgical clearance   HPI: Julia [name] is a 62 y.o. female presenting on 09/12/2023 for surgical clearance   Discussed the use of AI scribe software for clinical note transcription with the patient, who gave verbal consent to proceed.  History of Present Illness   The patient, with a longstanding history of left knee issues, is scheduled for a left knee replacement surgery with Dr. Lequita Halt. She expresses mixed feelings about the upcoming procedure, hoping for a successful outcome after enduring knee discomfort for over a decade. The patient denies any issues with the right knee.  In terms of general health, the patient denies any new symptoms such as chest pain, shortness of breath, or unusual fatigue. She does report always feeling tired, but this is not a new development. The patient is able to perform activities of daily living, but needs to rest due to knee discomfort, not due to any cardiorespiratory symptoms.  The patient's medication regimen has remained stable, with no recent changes. She reports no abnormal bleeding or bruising, unexplained weight loss, constipation, diarrhea, or recent illnesses.  The patient's lab work shows a slight decline in renal function, which is being monitored due to her long-term use of Mobic for arthritis.  The patient also reports urinary urgency and a sweet smell to her urine. The color of the urine varies throughout the  day, being more concentrated in the morning.          Relevant past medical, surgical, family, and social history reviewed and updated as indicated.  Allergies and medications reviewed and updated. Data reviewed: Chart in Epic.   Past Medical History:  Diagnosis Date   Anxiety    Arthritis    Complication of anesthesia    slow to wake up-does not take much medicine to sedate her   Depression    Esophageal reflux    Hyperlipidemia    Low ferritin 02/10/2021   Paget disease of bone    Pneumonia due to COVID-19 virus 05/20/2020   Vitamin B12 deficiency 02/10/2021   Vitamin D insufficiency 02/10/2021   Wears glasses     Past Surgical History:  Procedure Laterality Date   ANTERIOR FUSION CERVICAL SPINE  10/2019   C3-C5   BREAST CYST EXCISION Left    CERVICAL SPINE SURGERY  10/2011   CESAREAN SECTION     DILATION AND CURETTAGE OF UTERUS  1986   KNEE ARTHROSCOPY Left 07/22/2013   Procedure: LEFT KNEE ARTHROSCOPY WITH DEBRIDEMENT CHONDROPLASTY, REMOVAL LOOSE BODIES, PARTIAL MEDIAL MENISCECTOMY, OPEN EXCISION OF PES GANGLION;  Surgeon: [name], MD;  Location:  S[facility]ERY [facility];  Service: Orthopedics;  Laterality: Left;  NO SPECIMEN SENT PER DR. [name] ORDER.   KNEE SURGERY Left 11/21/2021   SPINE SURGERY  10/26/2019   TOTAL ABDOMINAL HYSTERECTOMY  05/2010   TAH   TUBAL LIGATION  1989    Social History   Socioeconomic History   Marital status: Significant Other    Spouse name: [name]   Number  of children: 3   Years of education: Not on file   Highest education level: 12th grade  Occupational History   Occupation: disability  Tobacco Use   Smoking status: Former    Current packs/day: 0.00    Average packs/day: 0.2 packs/day for 5.0 years (0.8 ttl pk-yrs)    Types: Cigarettes    Start date: 01/03/1980    Quit date: 01/02/1985    Years since quitting: 38.7   Smokeless tobacco: Never   Tobacco comments:    1 pack per week per pt.   Vaping Use    Vaping status: Never Used  Substance and Sexual Activity   Alcohol use: Not Currently    Alcohol/week: 1.0 standard drink of alcohol    Types: 1 Glasses of wine per week    Comment: occassional   Drug use: No   Sexual activity: Not Currently    Birth control/protection: Abstinence  Other Topics Concern   Not on file  Social History Narrative   2 sons live nearby, one son in [place]    05/09/2022 - Has 3 grandchildren - one almost a year old   Social Drivers of Corporate investment banker Strain: Low Risk  (09/12/2023)   Overall Financial Resource Strain (CARDIA)    Difficulty of Paying Living Expenses: Not very hard  Food Insecurity: No Food Insecurity (09/12/2023)   Hunger Vital Sign    Worried About Running Out of Food in the Last Year: Never true    Ran Out of Food in the Last Year: Never true  Transportation Needs: No Transportation Needs (09/12/2023)   PRAPARE - Administrator, Civil Service (Medical): No    Lack of Transportation (Non-Medical): No  Physical Activity: Inactive (09/12/2023)   Exercise Vital Sign    Days of Exercise per Week: 0 days    Minutes of Exercise per Session: 0 min  Stress: Stress Concern Present (09/12/2023)   [employer] of Occupational Health - Occupational Stress Questionnaire    Feeling of Stress : Very much  Social Connections: Socially Isolated (09/12/2023)   Social Connection and Isolation Panel [NHANES]    Frequency of Communication with Friends and Family: Once a week    Frequency of Social Gatherings with Friends and Family: Once a week    Attends Religious Services: Never    Database administrator or Organizations: No    Attends Banker Meetings: Never    Marital Status: Divorced  Catering manager Violence: Not At Risk (05/13/2023)   Humiliation, Afraid, Rape, and [name] questionnaire    Fear of Current or Ex-Partner: No    Emotionally Abused: No    Physically Abused: No    Sexually Abused: No     Outpatient Encounter Medications as of 09/12/2023  Medication Sig   atorvastatin (LIPITOR) 40 MG tablet Take 1 tablet (40 mg total) by mouth every evening.   buPROPion (WELLBUTRIN XL) 300 MG 24 hr tablet Take 1 tablet (300 mg total) by mouth daily.   Cholecalciferol (VITAMIN D3) 50 MCG (2000 UT) capsule Take 2,000 Units by mouth daily.   DULoxetine (CYMBALTA) 60 MG capsule Take 1 capsule (60 mg total) by mouth 2 (two) times daily.   gabapentin (NEURONTIN) 300 MG capsule Take 1 capsule (300 mg total) by mouth 2 (two) times daily.   meloxicam (MOBIC) 15 MG tablet Take 1 tablet (15 mg total) by mouth daily.   methocarbamol (ROBAXIN) 500 MG tablet Take 1 tablet (500 mg total) by  mouth every 8 (eight) hours as needed for muscle spasms.   traZODone (DESYREL) 50 MG tablet Take 0.5 tablets (25 mg total) by mouth at bedtime.   No facility-administered encounter medications on file as of 09/12/2023.    Allergies  Allergen Reactions   Lamotrigine Itching and Swelling    Pertinent ROS per HPI, otherwise unremarkable      Objective:  BP 133/88   Pulse 82   Temp (!) 96.9 F (36.1 C)   Ht 5\' 1"  (1.549 m)   Wt 161 lb 9.6 oz (73.3 kg)   LMP 01/02/2010   SpO2 98%   BMI 30.53 kg/m    Wt Readings from Last 3 Encounters:  09/12/23 161 lb 9.6 oz (73.3 kg)  08/07/23 158 lb 9.6 oz (71.9 kg)  04/25/23 154 lb 9.6 oz (70.1 kg)    Physical Exam Vitals and nursing note reviewed.  Constitutional:      General: She is not in acute distress.    Appearance: Normal appearance. She is obese. She is not ill-appearing, toxic-appearing or diaphoretic.  HENT:     Head: Normocephalic and atraumatic.     Right Ear: External ear normal.     Left Ear: External ear normal.     Nose: Nose normal.     Mouth/Throat:     Mouth: Mucous membranes are moist.     Pharynx: Oropharynx is clear.  Eyes:     Conjunctiva/sclera: Conjunctivae normal.     Pupils: Pupils are equal, round, and reactive to light.   Cardiovascular:     Rate and Rhythm: Normal rate and regular rhythm.     Pulses: Normal pulses.     Heart sounds: Normal heart sounds.  Pulmonary:     Effort: Pulmonary effort is normal.     Breath sounds: Normal breath sounds.  Abdominal:     General: Bowel sounds are normal.     Palpations: Abdomen is soft.     Tenderness: There is no abdominal tenderness.  Musculoskeletal:     Cervical back: Normal range of motion and neck supple.     Right lower leg: No edema.     Left lower leg: No edema.  Skin:    General: Skin is warm and dry.     Capillary Refill: Capillary refill takes less than 2 seconds.  Neurological:     General: No focal deficit present.     Mental Status: She is alert and oriented to person, place, and time.     Gait: Gait abnormal (antalgic).  Psychiatric:        Mood and Affect: Mood normal.        Behavior: Behavior normal.        Thought Content: Thought content normal.        Judgment: Judgment normal.        Results for orders placed or performed in visit on 09/09/23  Bayer DCA Hb A1c Waived   Collection Time: 09/09/23 10:41 AM  Result Value Ref Range   HB A1C (BAYER DCA - WAIVED) 5.4 4.8 - 5.6 %  VITAMIN D 25 Hydroxy (Vit-D Deficiency, Fractures)   Collection Time: 09/09/23 10:43 AM  Result Value Ref Range   Vit D, 25-Hydroxy 35.0 30.0 - 100.0 ng/mL  CBC with Differential/Platelet   Collection Time: 09/09/23 10:43 AM  Result Value Ref Range   WBC 6.7 3.4 - 10.8 x10E3/uL   RBC 4.49 3.77 - 5.28 x10E6/uL   Hemoglobin 13.7 11.1 - 15.9 g/dL  Hematocrit 42.6 34.0 - 46.6 %   MCV 95 79 - 97 fL   MCH 30.5 26.6 - 33.0 pg   MCHC 32.2 31.5 - 35.7 g/dL   RDW 78.2 95.6 - 21.3 %   Platelets 249 150 - 450 x10E3/uL   Neutrophils 70 Not Estab. %   Lymphs 20 Not Estab. %   Monocytes 7 Not Estab. %   Eos 3 Not Estab. %   Basos 0 Not Estab. %   Neutrophils Absolute 4.7 1.4 - 7.0 x10E3/uL   Lymphocytes Absolute 1.3 0.7 - 3.1 x10E3/uL   Monocytes Absolute  0.5 0.1 - 0.9 x10E3/uL   EOS (ABSOLUTE) 0.2 0.0 - 0.4 x10E3/uL   Basophils Absolute 0.0 0.0 - 0.2 x10E3/uL   Immature Granulocytes 0 Not Estab. %   Immature Grans (Abs) 0.0 0.0 - 0.1 x10E3/uL  CMP14+EGFR   Collection Time: 09/09/23 10:43 AM  Result Value Ref Range   Glucose 107 (H) 70 - 99 mg/dL   BUN 16 8 - 27 mg/dL   Creatinine, Ser 0.86 (H) 0.57 - 1.00 mg/dL   eGFR 60 >57 QI/ONG/2.95   BUN/Creatinine Ratio 15 12 - 28   Sodium 143 134 - 144 mmol/L   Potassium 4.7 3.5 - 5.2 mmol/L   Chloride 107 (H) 96 - 106 mmol/L   CO2 25 20 - 29 mmol/L   Calcium 9.5 8.7 - 10.3 mg/dL   Total Protein 6.6 6.0 - 8.5 g/dL   Albumin 4.3 3.9 - 4.9 g/dL   Globulin, Total 2.3 1.5 - 4.5 g/dL   Bilirubin Total 0.7 0.0 - 1.2 mg/dL   Alkaline Phosphatase 95 44 - 121 IU/L   AST 18 0 - 40 IU/L   ALT 15 0 - 32 IU/L  Lipid panel   Collection Time: 09/09/23 10:43 AM  Result Value Ref Range   Cholesterol, Total 178 100 - 199 mg/dL   Triglycerides 82 0 - 149 mg/dL   HDL 65 >28 mg/dL   VLDL Cholesterol Cal 15 5 - 40 mg/dL   LDL Chol Calc (NIH) 98 0 - 99 mg/dL   Chol/HDL Ratio 2.7 0.0 - 4.4 ratio     EKG: SR 79, PR 192 ms, QT 372 ms, no acute ST-T changes. No changes from prior EKG - Kari Baars, FNP-C.  Pertinent labs & imaging results that were available during my care of the patient were reviewed by me and considered in my medical decision making.  Assessment & Plan:  Julia [name] was seen today for surgical clearance.  Diagnoses and all orders for this visit:  Pre-operative examination -     EKG 12-Lead -     Urinalysis, Routine w reflex microscopic  Primary osteoarthritis of left knee Scheduled for surgery with Dr. Lequita Halt   Urinary urgency Urinalysis unremarkable in office.  -     Urinalysis, Routine w reflex microscopic     Assessment and Plan    Left Knee Osteoarthritis Chronic left knee osteoarthritis with over ten years of symptoms. Scheduled for left knee replacement surgery with Dr.  Antony Odea. Pre-operative physical therapy planned to ensure strength and readiness. Post-operative plan includes early mobilization to enhance recovery and reduce pain. Patient expressed concerns about the surgery's effectiveness but is hopeful for a positive outcome. - Complete pre-operative physical therapy - Encourage early mobilization post-surgery  Chronic Kidney Disease Stage 2 Mildly decreased renal function with GFR of 60. Mobic for arthritis may contribute to renal function decline. Discussed avoiding over-the-counter NSAIDs and increasing water intake  to prevent further decline. Emphasized proactive measures to maintain renal function. - Increase water intake to body weight in ounces per day - Avoid over-the-counter NSAIDs - Monitor renal function regularly  Hyperlipidemia Previously elevated cholesterol levels have improved significantly. LDL decreased from 165 to 90. Continued monitoring and management necessary. - Continue current lipid-lowering therapy - Monitor lipid levels regularly  General Health Maintenance Routine health maintenance discussed. Tetanus shot up to date. Recent lab work completed. Glucose levels slightly elevated but A1c is normal at 5.4. EKG and urine dip planned for surgical clearance. - Perform EKG - Perform urine dip - Review and sign surgical clearance paperwork  Follow-up - Review EKG and urine dip results - Sign surgical clearance paperwork before patient leaves.          Continue all other maintenance medications.  Follow up plan: Return if symptoms worsen or fail to improve.   Continue healthy lifestyle choices, including diet (rich in fruits, vegetables, and lean proteins, and low in salt and simple carbohydrates) and exercise (at least 30 minutes of moderate physical activity daily).    The above assessment and management plan was discussed with the patient. The patient verbalized understanding of and has agreed to the management plan.  Patient is aware to call the clinic if they develop any new symptoms or if symptoms persist or worsen. Patient is aware when to return to the clinic for a follow-up visit. Patient educated on when it is appropriate to go to the emergency department.   Kari Baars, FNP-C [facility] [phone]

## 2023-09-13 ENCOUNTER — Telehealth (HOSPITAL_COMMUNITY): Payer: Medicare HMO | Admitting: Psychiatry

## 2023-09-13 ENCOUNTER — Encounter (HOSPITAL_COMMUNITY): Payer: Self-pay | Admitting: Psychiatry

## 2023-09-13 DIAGNOSIS — F411 Generalized anxiety disorder: Secondary | ICD-10-CM

## 2023-09-13 DIAGNOSIS — F331 Major depressive disorder, recurrent, moderate: Secondary | ICD-10-CM

## 2023-09-13 DIAGNOSIS — E559 Vitamin D deficiency, unspecified: Secondary | ICD-10-CM

## 2023-09-16 DIAGNOSIS — M25562 Pain in left knee: Secondary | ICD-10-CM | POA: Diagnosis not present

## 2023-09-16 DIAGNOSIS — M25662 Stiffness of left knee, not elsewhere classified: Secondary | ICD-10-CM | POA: Diagnosis not present

## 2023-09-16 DIAGNOSIS — M1712 Unilateral primary osteoarthritis, left knee: Secondary | ICD-10-CM | POA: Diagnosis not present

## 2023-09-18 DIAGNOSIS — H524 Presbyopia: Secondary | ICD-10-CM | POA: Diagnosis not present

## 2023-09-18 DIAGNOSIS — H52221 Regular astigmatism, right eye: Secondary | ICD-10-CM | POA: Diagnosis not present

## 2023-09-23 ENCOUNTER — Encounter (HOSPITAL_COMMUNITY): Payer: Self-pay | Admitting: Clinical

## 2023-09-23 ENCOUNTER — Ambulatory Visit (HOSPITAL_COMMUNITY): Payer: Medicare HMO | Admitting: Clinical

## 2023-09-23 DIAGNOSIS — F411 Generalized anxiety disorder: Secondary | ICD-10-CM | POA: Diagnosis not present

## 2023-09-23 DIAGNOSIS — F331 Major depressive disorder, recurrent, moderate: Secondary | ICD-10-CM

## 2023-09-23 NOTE — Progress Notes (Addendum)
THERAPIST PROGRESS NOTE  Session Time: 2:00-3:00pm  Session #14  Virtual Visit via Video Note  I connected with Julia [name] on 09/23/23 at  2:00 PM EST by a video enabled telemedicine application and verified that I am speaking with the correct person using two identifiers.  Location: Patient: home Provider: private office   I discussed the limitations of evaluation and management by telemedicine and the availability of in person appointments. The patient expressed understanding and agreed to proceed.   I discussed the assessment and treatment plan with the patient. The patient was provided an opportunity to ask questions and all were answered. The patient agreed with the plan and demonstrated an understanding of the instructions.   The patient was advised to call back or seek an in-person evaluation if the symptoms worsen or if the condition fails to improve as anticipated.  I provided 60 minutes of non-face-to-face time during this encounter.  Lynnell Chad, LCSW   Participation Level: Active  Behavioral Response: Casual Alert Euthymic  Type of Therapy: Individual Therapy  Goals addressed:  Goal: LTG: Julia [name] will score less than 5 on the Generalized Anxiety Disorder 7 Scale (GAD-7) Goal: STG: Julia [name] will reduce frequency of avoidant behaviors by 50% as evidenced by self-report in therapy sessions Goal: LTG: Learn breathing techniques and grounding techniques at an age-appropriate level and demonstrate mastery in session then report independent use of these skills out of session. Goal: STG: Learn emotion regulation strategies and practice using them Goal: LTG: Reduce frequency, intensity, and duration of depression symptoms so that daily functioning is improved Goal: STG: Julia [name] will score less than 9 on the Patient Health Questionnaire (PHQ-9) Goal: LTG: Identify and decrease cognitive distortions contributing negatively to mood and behavior by identifying 5-7  cognitive distortions Julia [name] has and learning how to come up with replacement thoughts that are more balanced, realistic, and helpful. Goal: STG: Learn a variety of coping skills and demonstrate the ability to use them to decrease feelings of sadness, anger, and fear and increase feelings of happiness, peace, and powerfulness AEB gauging those emotions on 1-10 scale. Goal: Julia [name] will increase her resilience through application of CBT techniques and through processing of her life in a shame framework Goal: Julia [name] will learn about additional communication techniques, practice these with her partner and family, and report improved communication within these relationships. Goal: LTG: Learn about boundary types, how to implement them, and how to enforce them so that   ProgressTowards Goals: Progressing  Interventions: Supportive and Other: processing upcoming surgery    Summary: Julia [name] is a 62 y.o. female who presents with anxiety and depression for therapy. She presented oriented x5 and stated she was feeling "better than 2 days ago when I was crying and really upset, thought maybe it would be best if I went into the surgery and just didn't wake up after."  CSW evaluated patient's medication compliance, use of coping tools, and self-care, as applicable.   She clearly stated she was not suicidal and was no longer having even  passive thoughts.  She was somewhat perturbed because she got a large, expensive gift for her partner and he said yesterday he needed to get her "something."  She feels she is not important to him when things like this occur.  She did confront him calmly and with "I" statements about not having acknowledged the many things she gave his daughter for their new baby.  She knows she is going to need help after surgery  and stated he has taken the week off, but she thinks he will end up working down in his shop which is of no use to her.  CSW encouraged her to use "I" statements to share  that she needs him to stay in the house, close by, to be able to assist her when needed.  She has discovered in the 2 weeks of pre-op care that she has stage 2 kidney disease which is concerning to her, of course.  She feels the holidays are hard on her in particular this year because of being overwhelmed with all the tasks she feels responsible for, with the upcoming surgery, and needing to get new labs to check on kidneys further.  She was encouraged to continue to work on [employer] daily.  PHQ-9 and GAD-7 were administered and patient's scores had increased significantly, which she attributed to her upcoming surgery and the news of having kidney disease.  The scores are now 21 and 20, respectively.  We processed what this means and that this is a reflection of the last 2 weeks only.  Suicidal/Homicidal: No  Therapist Response:  Patient is progressing AEB engaging in scheduled therapy session.  Throughout the session, CSW gave patient the opportunity to explore thoughts and feelings associated with current life situations and past/present stressors.   CSW challenged patient gently and appropriately to consider different ways of looking at reported issues. CSW encouraged patient's expression of feelings and validated these using empathy, active listening, open body language, and unconditional positive regard.   CSW encouraged patient to schedule more therapy sessions for the future, as needed.    Recommendations:  Return to therapy in 2 weeks, engage in self care behaviors, continue to follow through with the current plan for the holidays of letting others take care of what they have said they would take care of  Plan: Return again in 2 weeks, on 1/8, CSW to do PHQ-9 and GAD-7 at next session  Diagnosis:  GAD (generalized anxiety disorder)  Moderate episode of recurrent major depressive disorder (HCC)  Collaboration of Care: Psychiatrist AEB -   psychiatrist can read therapy notes; therapist  can and does read psychiatric notes prior to sessions   Patient/Guardian was advised Release of Information must be obtained prior to any record release in order to collaborate their care with an outside provider. Patient/Guardian was advised if they have not already done so to contact the registration department to sign all necessary forms in order for [place] to release information regarding their care.   Consent: Patient/Guardian gives verbal consent for treatment and assignment of benefits for services provided during this visit. Patient/Guardian expressed understanding and agreed to proceed.      09/23/2023    2:49 PM 09/12/2023    3:28 PM 08/07/2023   11:00 AM 05/13/2023    8:21 AM 04/25/2023   10:22 AM 11/23/2022   11:16 AM  Depression screen PHQ 2/9  Decreased Interest 3 3 2 1 3 3   Down, Depressed, Hopeless 3 2 2 1 3 3   PHQ - 2 Score 6 5 4 2 6 6   Altered sleeping 3 3 3 3 3 3   Tired, decreased energy 3 3 3 3 3 3   Change in appetite 2 3 3 1 3 3   Feeling bad or failure about yourself  3 2 2 3 2 3   Trouble concentrating 2 2 2 3 1 2   Moving slowly or fidgety/restless 2 2 2  0 1 1  Suicidal thoughts 0  0 0 0 0 0  PHQ-9 Score 21 20 19 15 19 21   Difficult doing work/chores  Very difficult Extremely dIfficult  Not difficult at all Very difficult      09/23/2023    2:52 PM 09/12/2023    3:28 PM 08/07/2023   11:00 AM 04/25/2023   10:23 AM 11/23/2022   11:17 AM  GAD 7 : Generalized Anxiety Score  Nervous, Anxious, on Edge 3 3 3 3 3   Control/stop worrying 3 3 3 3 3   Worry too much - different things 3 3 3 3 3   Trouble relaxing 3 2 2 3 2   Restless 2 2 2 3 2   Easily annoyed or irritable 3 2 2 3 2   Afraid - awful might happen 3 2 2 2 2   Total GAD 7 Score 20 17 17 20 17   Anxiety Difficulty Very difficult Extremely difficult Very difficult Not difficult at all Very difficult      Lynnell Chad, LCSW 09/23/2023

## 2023-10-01 ENCOUNTER — Telehealth: Payer: Self-pay | Admitting: Family Medicine

## 2023-10-01 NOTE — Telephone Encounter (Unsigned)
 Copied from CRM [id number]. Topic: Medical Record Request - Provider/Facility Request >> Oct 01, 2023  8:34 AM Powell HERO wrote: Reason for CRM: A clearance form needs to be filled out for this patient and returned by fax. It was faxed over on the 27th. Lori at [facility] [employer] [employer]. [phone] call if any questions.

## 2023-10-08 ENCOUNTER — Encounter: Payer: Medicare HMO | Admitting: Family Medicine

## 2023-10-09 ENCOUNTER — Encounter (HOSPITAL_COMMUNITY): Payer: Self-pay | Admitting: Clinical

## 2023-10-09 ENCOUNTER — Ambulatory Visit (HOSPITAL_COMMUNITY): Payer: Medicare HMO | Admitting: Clinical

## 2023-10-09 DIAGNOSIS — F331 Major depressive disorder, recurrent, moderate: Secondary | ICD-10-CM | POA: Diagnosis not present

## 2023-10-09 DIAGNOSIS — F411 Generalized anxiety disorder: Secondary | ICD-10-CM | POA: Diagnosis not present

## 2023-10-09 NOTE — Progress Notes (Signed)
 THERAPIST PROGRESS NOTE  Session Time: 2:03-3:00pm  Session #15  Participation Level: Active  Behavioral Response: Casual Alert Euthymic  Type of Therapy: Individual Therapy  Goals addressed:  Goal: LTG: Julia [name] will score less than 5 on the Generalized Anxiety Disorder 7 Scale (GAD-7) Goal: STG: Julia [name] will reduce frequency of avoidant behaviors by 50% as evidenced by self-report in therapy sessions Goal: LTG: Learn breathing techniques and grounding techniques at an age-appropriate level and demonstrate mastery in session then report independent use of these skills out of session. Goal: STG: Learn emotion regulation strategies and practice using them Goal: LTG: Reduce frequency, intensity, and duration of depression symptoms so that daily functioning is improved Goal: STG: Julia [name] will score less than 9 on the Patient Health Questionnaire (PHQ-9) Goal: LTG: Identify and decrease cognitive distortions contributing negatively to mood and behavior by identifying 5-7 cognitive distortions Julia [name] has and learning how to come up with replacement thoughts that are more balanced, realistic, and helpful. Goal: STG: Learn a variety of coping skills and demonstrate the ability to use them to decrease feelings of sadness, anger, and fear and increase feelings of happiness, peace, and powerfulness AEB gauging those emotions on 1-10 scale. Goal: Julia [name] will increase her resilience through application of CBT techniques and through processing of her life in a shame framework Goal: Julia [name] will learn about additional communication techniques, practice these with her partner and family, and report improved communication within these relationships. Goal: LTG: Learn about boundary types, how to implement them, and how to enforce them so that feels more empowered and content with being able to maintain more helpful, appropriate boundaries in the future for a more balanced result.  ProgressTowards Goals:  Progressing  Interventions: Supportive, Meditation: breathing, and Other: processing upcoming surgery    Summary: Julia [name] is a 63 y.o. female who presents with anxiety and depression for therapy. She presented oriented x5 and stated she was feeling nervous.  CSW evaluated patient's medication compliance, use of coping tools, and self-care, as applicable.  Her knee surgery is on Tuesday 1/14.  This was processed at length.  Because of her Pagets' disease, she is fearful that when they get into her knee during surgery they will find more problems or something even worse.  CSW reinforced again that in the anteroom prior to surgery, she can start doing the 5-finger breathing technique which doctors tout as one way to decreased the amount of anesthesia needed.  She stated she will do so.  We talked about how she can ask for her husband to meet her needs rather than getting angry with him for not doing so.  She has plans to give him a new to do list of things to do while he is off the week of her surgery, was somewhat disappointed when CSW told her that she may end up just wanting him near her to do things in the moment for her that she cannot do for herself.  She hopes he can do both, but if he can only do one, it will be take care of her, she promised.  He actually gave her back the To Do list created in session, for her to revise this, and she expressed appreciation that seeing it in writing really did help him to do more of what she asked him to.  Although she has a lot of anger and hurt for/from her mother, she stated she does not know how her mother did it, wrapping Christmas gifts for 6 kids  and taking them all to their various activities and appointments.  We talked once again about creating a gratitude not just in her head but in a journal.  Currently she continues to just think about it.  She is grateful for the sun, the snow, and the chance to talk to her sun who lives in [place] .  This is  significant progress, however.   Suicidal/Homicidal: No without intent/plan  Therapist Response:  Patient is progressing AEB engaging in scheduled therapy session.  Throughout the session, CSW gave patient the opportunity to explore thoughts and feelings associated with current life situations and past/present stressors.   CSW challenged patient gently and appropriately to consider different ways of looking at reported issues. CSW encouraged patient's expression of feelings and validated these using empathy, active listening, open body language, and unconditional positive regard.   CSW encouraged patient to schedule more therapy sessions for the future, as needed.    Recommendations:  Return to therapy in 2 weeks, engage in self care behaviors, let partner help her after surgery  Plan: Return again in 2 weeks, on 1/22  Diagnosis:  GAD (generalized anxiety disorder)  Moderate episode of recurrent major depressive disorder (HCC)  Collaboration of Care: Psychiatrist AEB -   psychiatrist can read therapy notes; therapist can and does read psychiatric notes prior to sessions   Patient/Guardian was advised Release of Information must be obtained prior to any record release in order to collaborate their care with an outside provider. Patient/Guardian was advised if they have not already done so to contact the registration department to sign all necessary forms in order for us  to release information regarding their care.   Consent: Patient/Guardian gives verbal consent for treatment and assignment of benefits for services provided during this visit. Patient/Guardian expressed understanding and agreed to proceed.      09/23/2023    2:49 PM 09/12/2023    3:28 PM 08/07/2023   11:00 AM 05/13/2023    8:21 AM 04/25/2023   10:22 AM 11/23/2022   11:16 AM  Depression screen PHQ 2/9  Decreased Interest 3 3 2 1 3 3   Down, Depressed, Hopeless 3 2 2 1 3 3   PHQ - 2 Score 6 5 4 2 6 6   Altered sleeping 3 3 3 3  3 3   Tired, decreased energy 3 3 3 3 3 3   Change in appetite 2 3 3 1 3 3   Feeling bad or failure about yourself  3 2 2 3 2 3   Trouble concentrating 2 2 2 3 1 2   Moving slowly or fidgety/restless 2 2 2  0 1 1  Suicidal thoughts 0 0 0 0 0 0  PHQ-9 Score 21 20 19 15 19 21   Difficult doing work/chores  Very difficult Extremely dIfficult  Not difficult at all Very difficult      09/23/2023    2:52 PM 09/12/2023    3:28 PM 08/07/2023   11:00 AM 04/25/2023   10:23 AM 11/23/2022   11:17 AM  GAD 7 : Generalized Anxiety Score  Nervous, Anxious, on Edge 3 3 3 3 3   Control/stop worrying 3 3 3 3 3   Worry too much - different things 3 3 3 3 3   Trouble relaxing 3 2 2 3 2   Restless 2 2 2 3 2   Easily annoyed or irritable 3 2 2 3 2   Afraid - awful might happen 3 2 2 2 2   Total GAD 7 Score 20 17 17 20 17   Anxiety Difficulty  Very difficult Extremely difficult Very difficult Not difficult at all Very difficult      Elgie JINNY Crest, LCSW 10/09/2023

## 2023-10-15 DIAGNOSIS — M1712 Unilateral primary osteoarthritis, left knee: Secondary | ICD-10-CM | POA: Diagnosis not present

## 2023-10-15 DIAGNOSIS — M25762 Osteophyte, left knee: Secondary | ICD-10-CM | POA: Diagnosis not present

## 2023-10-15 DIAGNOSIS — G8918 Other acute postprocedural pain: Secondary | ICD-10-CM | POA: Diagnosis not present

## 2023-10-15 HISTORY — PX: REPLACEMENT TOTAL KNEE: SUR1224

## 2023-10-18 ENCOUNTER — Ambulatory Visit: Payer: Medicare HMO | Attending: Orthopedic Surgery

## 2023-10-18 ENCOUNTER — Other Ambulatory Visit: Payer: Self-pay

## 2023-10-18 DIAGNOSIS — R600 Localized edema: Secondary | ICD-10-CM | POA: Diagnosis not present

## 2023-10-18 DIAGNOSIS — M25662 Stiffness of left knee, not elsewhere classified: Secondary | ICD-10-CM | POA: Diagnosis not present

## 2023-10-18 DIAGNOSIS — M25561 Pain in right knee: Secondary | ICD-10-CM | POA: Diagnosis not present

## 2023-10-18 DIAGNOSIS — M25562 Pain in left knee: Secondary | ICD-10-CM

## 2023-10-18 DIAGNOSIS — M25661 Stiffness of right knee, not elsewhere classified: Secondary | ICD-10-CM | POA: Insufficient documentation

## 2023-10-18 NOTE — Therapy (Addendum)
OUTPATIENT PHYSICAL THERAPY LOWER EXTREMITY EVALUATION   Patient Name: Julia [name] MRN: [id number] DOB:1961/06/17, 63 y.o., female Today's Date: 10/18/2023  END OF SESSION:  PT End of Session - 10/18/23 1127     Visit Number 1    Number of Visits 8    Date for PT Re-Evaluation 11/15/23    PT Start Time 1146    PT Stop Time 1228    PT Time Calculation (min) 42 min    Activity Tolerance Patient tolerated treatment well    Behavior During Therapy [facility] for tasks assessed/performed             Past Medical History:  Diagnosis Date   Anxiety    Arthritis    Complication of anesthesia    slow to wake up-does not take much medicine to sedate her   Depression    Esophageal reflux    Hyperlipidemia    Low ferritin 02/10/2021   Paget disease of bone    Pneumonia due to COVID-19 virus 05/20/2020   Vitamin B12 deficiency 02/10/2021   Vitamin D insufficiency 02/10/2021   Wears glasses    Past Surgical History:  Procedure Laterality Date   ANTERIOR FUSION CERVICAL SPINE  10/2019   C3-C5   BREAST CYST EXCISION Left    CERVICAL SPINE SURGERY  10/2011   CESAREAN SECTION     DILATION AND CURETTAGE OF UTERUS  1986   KNEE ARTHROSCOPY Left 07/22/2013   Procedure: LEFT KNEE ARTHROSCOPY WITH DEBRIDEMENT CHONDROPLASTY, REMOVAL LOOSE BODIES, PARTIAL MEDIAL MENISCECTOMY, OPEN EXCISION OF PES GANGLION;  Surgeon: [name], MD;  Location: [facility];  Service: Orthopedics;  Laterality: Left;  NO SPECIMEN SENT PER DR. [name] ORDER.   KNEE SURGERY Left 11/21/2021   SPINE SURGERY  10/26/2019   TOTAL ABDOMINAL HYSTERECTOMY  05/2010   TAH   TUBAL LIGATION  1989   Patient Active Problem List   Diagnosis Date Noted   MDD (major depressive disorder), recurrent episode (HCC) 08/02/2022   Osteoarthritis of left knee 06/23/2022   Osteoarthritis of right acromioclavicular joint 12/07/2021   Arthritis 02/12/2021   Prediabetes 02/12/2021   Mixed stress and urge urinary  incontinence 02/12/2021   Obesity (BMI 30.0-34.9) 02/12/2021   Low ferritin 02/10/2021   Vitamin D insufficiency 02/10/2021   Vitamin B12 deficiency 02/10/2021   COVID-19 long hauler 10/13/2020   Neuropathy 08/22/2020   Chronic pain 02/10/2016   Paget disease of bone 12/28/2015   Esophageal reflux 08/15/2015   Mixed hyperlipidemia 08/15/2015   Chronic pain in shoulder 08/15/2015   Anxiety and depression 11/25/2013   GAD (generalized anxiety disorder) 11/25/2013   IBS (irritable bowel syndrome) 01/02/2013    PCP: Sonny Masters, FNP  REFERRING PROVIDER: Ollen Gross, MD   REFERRING DIAG: Osteoarthritis of left knee joint  THERAPY DIAG:  Localized edema - Plan: PT plan of care cert/re-cert  Acute pain of left knee  Stiffness of left knee, not elsewhere classified  Rationale for Evaluation and Treatment: Rehabilitation  ONSET DATE: 10/15/23  SUBJECTIVE:   SUBJECTIVE STATEMENT: Patient reports that she had a left total knee replacement on 10/15/23. She notes that her nerve block is starting to wear off. She notes that the outside of her knee is numb, but it has been hypersensitive since her first knee surgery. She has been doing ankle pumps, quad sets, and resting her leg straight when she is not walking or using ice.   PERTINENT HISTORY: Neuropathy, Paget disease, arthritis, anxiety, depression, chronic  pain, and history of COVID-19 PAIN:  Are you having pain? Yes: NPRS scale: 7/10 Pain location: left thigh and knee Pain description: constant aching and throbbing with intermittent soreness Aggravating factors: getting into and out of bed Relieving factors: laying down, ice, and medication  PRECAUTIONS: Fall (due to poor vision)   RED FLAGS: None   WEIGHT BEARING RESTRICTIONS: No  FALLS:  Has patient fallen in last 6 months? No  LIVING ENVIRONMENT: Lives with: lives with an adult companion Lives in: House/apartment Stairs: Yes: External: 3 steps; none Has  following equipment at home: Walker - 2 wheeled  OCCUPATION: disabled  PLOF: Independent  PATIENT GOALS: reduced pain, improved, be able to watch her granddaughter, and improved sleep  NEXT MD VISIT: 10/29/23  OBJECTIVE:  Note: Objective measures were completed at Evaluation unless otherwise noted.  PATIENT SURVEYS:  FOTO 21.03  COGNITION: Overall cognitive status: Within functional limits for tasks assessed     SENSATION: Patient reports left lateral knee numbness since surgery.   EDEMA:  Circumferential: L tibiofemoral joint line: 46.5 cm R tibiofemoral joint line: 39.0 cm  PALPATION: TTP: right quadriceps, IT band, and lateral knee  LOWER EXTREMITY ROM:  Active ROM Right eval Left eval  Hip flexion    Hip extension    Hip abduction    Hip adduction    Hip internal rotation    Hip external rotation    Knee flexion 140 64/ 38 (PROM)   Knee extension 0 21  Ankle dorsiflexion    Ankle plantarflexion    Ankle inversion    Ankle eversion     (Blank rows = not tested)  LOWER EXTREMITY MMT: not tested due to surgical condition  LOWER EXTREMITY SPECIAL TESTS:  Not tested due to surgical condition  GAIT: Assistive device utilized: Environmental consultant - 2 wheeled Level of assistance: Modified independence Comments: decreased gait speed and stride length with left knee flexed in stance phase                                                                                                                                TREATMENT DATE:   Modalities: no adverse reaction to today's modalities  Date:  Vaso: Knee, 34 degrees; low pressure, 5 mins, Pain and Edema  PATIENT EDUCATION:  Education details: plan of care, prognosis, healing, objective findings, expectations for soreness, and goals for physical therapy Person educated: Patient and significant other Education method: Explanation Education comprehension: verbalized understanding  HOME EXERCISE  PROGRAM:   ASSESSMENT:  CLINICAL IMPRESSION: Patient is a 63 y.o. female who was seen today for physical therapy evaluation and treatment following a left total knee arthroplasty on 10/15/23. She presented with high pain severity and irritability with passive left knee flexion being the most aggravating to her familiar symptoms. She exhibited elevated left knee edema compared to the right knee. However, she exhibited no other signs or symptoms of a postoperative complication. Recommend that she  continue with skilled physical therapy to address her impairments to return to her prior level of function.    OBJECTIVE IMPAIRMENTS: Abnormal gait, decreased activity tolerance, decreased balance, decreased mobility, difficulty walking, decreased ROM, decreased strength, hypomobility, increased edema, impaired flexibility, impaired sensation, impaired tone, and pain.   ACTIVITY LIMITATIONS: carrying, lifting, standing, squatting, sleeping, stairs, transfers, dressing, locomotion level, and caring for others  PARTICIPATION LIMITATIONS: meal prep, cleaning, laundry, driving, shopping, and community activity  PERSONAL FACTORS: Past/current experiences, Transportation, and 3+ comorbidities: Neuropathy, Paget disease, arthritis, anxiety, depression, chronic pain, and history of COVID-19  are also affecting patient's functional outcome.   REHAB POTENTIAL: Good  CLINICAL DECISION MAKING: Evolving/moderate complexity  EVALUATION COMPLEXITY: Moderate   GOALS: Goals reviewed with patient? Yes  LONG TERM GOALS: Target date: 11/15/23  Patient will be independent with her HEP.  Baseline:  Goal status: INITIAL  2.  Patient will improve her active left knee extension within 5 degrees of neutral for improved gait mechanics.  Baseline:  Goal status: INITIAL  3.  Patient will improve her left knee flexion to at least 120 degrees for improved function navigating stairs.  Baseline:  Goal status: INITIAL  4.   Patient will be able to safely navigate with a cane or the least restrictive assistive device for improved household mobility.  Baseline:  Goal status: INITIAL  5.  Patient will be able to navigate at least 3 steps with a reciprocal pattern for improved household mobility.  Baseline:  Goal status: INITIAL  PLAN:  PT FREQUENCY: 2-3x/week  PT DURATION: 4 weeks  PLANNED INTERVENTIONS: 97164- PT Re-evaluation, 97110-Therapeutic exercises, 97530- Therapeutic activity, 97112- Neuromuscular re-education, 97535- Self Care, 38756- Manual therapy, [id number]- Gait training, 97014- Electrical stimulation (unattended), 97016- Vasopneumatic device, Patient/Family education, Balance training, Stair training, Joint mobilization, Cryotherapy, and Moist heat  PLAN FOR NEXT SESSION: Nustep, quad sets, heel slides, PROM, and modalities as needed   Granville Lewis, PT 10/18/2023, 12:44 PM

## 2023-10-22 ENCOUNTER — Encounter: Payer: Medicare HMO | Admitting: Physical Therapy

## 2023-10-22 ENCOUNTER — Ambulatory Visit: Payer: Medicare HMO | Admitting: Physical Therapy

## 2023-10-22 DIAGNOSIS — M25661 Stiffness of right knee, not elsewhere classified: Secondary | ICD-10-CM | POA: Diagnosis not present

## 2023-10-22 DIAGNOSIS — R600 Localized edema: Secondary | ICD-10-CM | POA: Diagnosis not present

## 2023-10-22 DIAGNOSIS — M25562 Pain in left knee: Secondary | ICD-10-CM | POA: Diagnosis not present

## 2023-10-22 DIAGNOSIS — M25561 Pain in right knee: Secondary | ICD-10-CM | POA: Diagnosis not present

## 2023-10-22 DIAGNOSIS — M25662 Stiffness of left knee, not elsewhere classified: Secondary | ICD-10-CM | POA: Diagnosis not present

## 2023-10-22 NOTE — Therapy (Signed)
OUTPATIENT PHYSICAL THERAPY LOWER EXTREMITY   Patient Name: Julia [name] MRN: [id number] DOB:02-14-1961, 63 y.o., female Today's Date: 10/22/2023  END OF SESSION:  PT End of Session - 10/22/23 1402     Visit Number 2    Number of Visits 8    Date for PT Re-Evaluation 11/15/23    PT Start Time 0100    PT Stop Time 0156    PT Time Calculation (min) 56 min    Activity Tolerance Patient tolerated treatment well    Behavior During Therapy [facility] for tasks assessed/performed             Past Medical History:  Diagnosis Date   Anxiety    Arthritis    Complication of anesthesia    slow to wake up-does not take much medicine to sedate her   Depression    Esophageal reflux    Hyperlipidemia    Low ferritin 02/10/2021   Paget disease of bone    Pneumonia due to COVID-19 virus 05/20/2020   Vitamin B12 deficiency 02/10/2021   Vitamin D insufficiency 02/10/2021   Wears glasses    Past Surgical History:  Procedure Laterality Date   ANTERIOR FUSION CERVICAL SPINE  10/2019   C3-C5   BREAST CYST EXCISION Left    CERVICAL SPINE SURGERY  10/2011   CESAREAN SECTION     DILATION AND CURETTAGE OF UTERUS  1986   KNEE ARTHROSCOPY Left 07/22/2013   Procedure: LEFT KNEE ARTHROSCOPY WITH DEBRIDEMENT CHONDROPLASTY, REMOVAL LOOSE BODIES, PARTIAL MEDIAL MENISCECTOMY, OPEN EXCISION OF PES GANGLION;  Surgeon: [name], MD;  Location: [facility];  Service: Orthopedics;  Laterality: Left;  NO SPECIMEN SENT PER DR. [name] ORDER.   KNEE SURGERY Left 11/21/2021   SPINE SURGERY  10/26/2019   TOTAL ABDOMINAL HYSTERECTOMY  05/2010   TAH   TUBAL LIGATION  1989   Patient Active Problem List   Diagnosis Date Noted   MDD (major depressive disorder), recurrent episode (HCC) 08/02/2022   Osteoarthritis of left knee 06/23/2022   Osteoarthritis of right acromioclavicular joint 12/07/2021   Arthritis 02/12/2021   Prediabetes 02/12/2021   Mixed stress and urge urinary incontinence  02/12/2021   Obesity (BMI 30.0-34.9) 02/12/2021   Low ferritin 02/10/2021   Vitamin D insufficiency 02/10/2021   Vitamin B12 deficiency 02/10/2021   COVID-19 long hauler 10/13/2020   Neuropathy 08/22/2020   Chronic pain 02/10/2016   Paget disease of bone 12/28/2015   Esophageal reflux 08/15/2015   Mixed hyperlipidemia 08/15/2015   Chronic pain in shoulder 08/15/2015   Anxiety and depression 11/25/2013   GAD (generalized anxiety disorder) 11/25/2013   IBS (irritable bowel syndrome) 01/02/2013    PCP: Sonny Masters, FNP  REFERRING PROVIDER: Ollen Gross, MD   REFERRING DIAG: Osteoarthritis of left knee joint  THERAPY DIAG:  Acute pain of right knee  Stiffness of right knee, not elsewhere classified  Localized edema  Rationale for Evaluation and Treatment: Rehabilitation  ONSET DATE: 10/15/23  SUBJECTIVE:   SUBJECTIVE STATEMENT: Still hurting a lot.  PERTINENT HISTORY: Neuropathy, Paget disease, arthritis, anxiety, depression, chronic pain, and history of COVID-19 PAIN:  Are you having pain? Yes: NPRS scale: 7/10 Pain location: left thigh and knee Pain description: constant aching and throbbing with intermittent soreness Aggravating factors: getting into and out of bed Relieving factors: laying down, ice, and medication  PRECAUTIONS: Fall (due to poor vision)   RED FLAGS: None   WEIGHT BEARING RESTRICTIONS: No  FALLS:  Has patient fallen  in last 6 months? No  LIVING ENVIRONMENT: Lives with: lives with an adult companion Lives in: House/apartment Stairs: Yes: External: 3 steps; none Has following equipment at home: Walker - 2 wheeled  OCCUPATION: disabled  PLOF: Independent  PATIENT GOALS: reduced pain, improved, be able to watch her granddaughter, and improved sleep  NEXT MD VISIT: 10/29/23  OBJECTIVE:  Note: Objective measures were completed at Evaluation unless otherwise noted.  PATIENT SURVEYS:  FOTO 21.03  COGNITION: Overall cognitive  status: Within functional limits for tasks assessed     SENSATION: Patient reports left lateral knee numbness since surgery.   EDEMA:  Circumferential: L tibiofemoral joint line: 46.5 cm R tibiofemoral joint line: 39.0 cm  PALPATION: TTP: right quadriceps, IT band, and lateral knee  LOWER EXTREMITY ROM:  Active ROM Right eval Left eval  Hip flexion    Hip extension    Hip abduction    Hip adduction    Hip internal rotation    Hip external rotation    Knee flexion 140 64/ 38 (PROM)   Knee extension 0 21  Ankle dorsiflexion    Ankle plantarflexion    Ankle inversion    Ankle eversion     (Blank rows = not tested)  LOWER EXTREMITY MMT: not tested due to surgical condition  LOWER EXTREMITY SPECIAL TESTS:  Not tested due to surgical condition  GAIT: Assistive device utilized: Environmental consultant - 2 wheeled Level of assistance: Modified independence Comments: decreased gait speed and stride length with left knee flexed in stance phase                                                                                                                                TREATMENT DATE:   10/22/23:                                     EXERCISE LOG  Exercise Repetitions and Resistance Comments  Nustep Level 1 moving seat forward x 1 to increase knee flexion.   Rockerboard in parallel bars 3 minutes   SAQ's 1# x 3 minutes           In supine:  Gentle stretching into left knee flexion and extension x 7 minutes f/b  LE elevation and vasopneumatic on low to patient's left knee x 20 minutes.     PATIENT EDUCATION:  Education details: plan of care, prognosis, healing, objective findings, expectations for soreness, and goals for physical therapy Person educated: Patient and significant other Education method: Explanation Education comprehension: verbalized understanding  HOME EXERCISE PROGRAM:   ASSESSMENT:  CLINICAL IMPRESSION: The patient did well today with Nustep and gentle passive  left knee stretching.  We discussed her using the Zero Knee to improve her left knee extension.  OBJECTIVE IMPAIRMENTS: Abnormal gait, decreased activity tolerance, decreased balance, decreased mobility, difficulty walking, decreased ROM, decreased strength, hypomobility,  increased edema, impaired flexibility, impaired sensation, impaired tone, and pain.   ACTIVITY LIMITATIONS: carrying, lifting, standing, squatting, sleeping, stairs, transfers, dressing, locomotion level, and caring for others  PARTICIPATION LIMITATIONS: meal prep, cleaning, laundry, driving, shopping, and community activity  PERSONAL FACTORS: Past/current experiences, Transportation, and 3+ comorbidities: Neuropathy, Paget disease, arthritis, anxiety, depression, chronic pain, and history of COVID-19  are also affecting patient's functional outcome.   REHAB POTENTIAL: Good  CLINICAL DECISION MAKING: Evolving/moderate complexity  EVALUATION COMPLEXITY: Moderate   GOALS: Goals reviewed with patient? Yes  LONG TERM GOALS: Target date: 11/15/23  Patient will be independent with her HEP.  Baseline:  Goal status: INITIAL  2.  Patient will improve her active left knee extension within 5 degrees of neutral for improved gait mechanics.  Baseline:  Goal status: INITIAL  3.  Patient will improve her right knee flexion to at least 120 degrees for improved function navigating stairs.  Baseline:  Goal status: INITIAL  4.  Patient will be able to safely navigate with a cane or the least restrictive assistive device for improved household mobility.  Baseline:  Goal status: INITIAL  5.  Patient will be able to navigate at least 3 steps with a reciprocal pattern for improved household mobility.  Baseline:  Goal status: INITIAL  PLAN:  PT FREQUENCY: 2-3x/week  PT DURATION: 4 weeks  PLANNED INTERVENTIONS: 97164- PT Re-evaluation, 97110-Therapeutic exercises, 97530- Therapeutic activity, 97112- Neuromuscular re-education,  97535- Self Care, 16109- Manual therapy, [id number]- Gait training, 97014- Electrical stimulation (unattended), 97016- Vasopneumatic device, Patient/Family education, Balance training, Stair training, Joint mobilization, Cryotherapy, and Moist heat  PLAN FOR NEXT SESSION: Nustep, quad sets, heel slides, PROM, and modalities as needed   Fae Blossom, [place], PT 10/22/2023, 2:18 PM

## 2023-10-23 ENCOUNTER — Ambulatory Visit (INDEPENDENT_AMBULATORY_CARE_PROVIDER_SITE_OTHER): Payer: Medicare HMO | Admitting: Clinical

## 2023-10-23 DIAGNOSIS — F331 Major depressive disorder, recurrent, moderate: Secondary | ICD-10-CM

## 2023-10-23 DIAGNOSIS — F411 Generalized anxiety disorder: Secondary | ICD-10-CM

## 2023-10-23 NOTE — Progress Notes (Signed)
Patient ID: Julia [name], female   DOB: Feb 05, 1961, 63 y.o.   MRN: [id number]  Patient was scheduled for group session, was sent information via email, and a phone message was left informing her of this, reminding her of the appointment.  She did not show up for the group.  [name], LCSW 10/23/2023, 6:30 PM

## 2023-10-24 ENCOUNTER — Encounter: Payer: Self-pay | Admitting: Physical Therapy

## 2023-10-24 ENCOUNTER — Ambulatory Visit: Payer: Medicare HMO | Admitting: Physical Therapy

## 2023-10-24 DIAGNOSIS — R600 Localized edema: Secondary | ICD-10-CM | POA: Diagnosis not present

## 2023-10-24 DIAGNOSIS — M25561 Pain in right knee: Secondary | ICD-10-CM

## 2023-10-24 DIAGNOSIS — M25661 Stiffness of right knee, not elsewhere classified: Secondary | ICD-10-CM | POA: Diagnosis not present

## 2023-10-24 DIAGNOSIS — M25562 Pain in left knee: Secondary | ICD-10-CM | POA: Diagnosis not present

## 2023-10-24 DIAGNOSIS — M25662 Stiffness of left knee, not elsewhere classified: Secondary | ICD-10-CM | POA: Diagnosis not present

## 2023-10-24 NOTE — Therapy (Signed)
OUTPATIENT PHYSICAL THERAPY LOWER EXTREMITY   Patient Name: Julia [name] MRN: [id number] DOB:11-23-1960, 63 y.o., female Today's Date: 10/24/2023  END OF SESSION:  PT End of Session - 10/24/23 1602     Visit Number 3    Number of Visits 8    Date for PT Re-Evaluation 11/15/23    PT Start Time 0315    PT Stop Time 0405    PT Time Calculation (min) 50 min    Activity Tolerance Patient tolerated treatment well    Behavior During Therapy [facility] for tasks assessed/performed             Past Medical History:  Diagnosis Date   Anxiety    Arthritis    Complication of anesthesia    slow to wake up-does not take much medicine to sedate her   Depression    Esophageal reflux    Hyperlipidemia    Low ferritin 02/10/2021   Paget disease of bone    Pneumonia due to COVID-19 virus 05/20/2020   Vitamin B12 deficiency 02/10/2021   Vitamin D insufficiency 02/10/2021   Wears glasses    Past Surgical History:  Procedure Laterality Date   ANTERIOR FUSION CERVICAL SPINE  10/2019   C3-C5   BREAST CYST EXCISION Left    CERVICAL SPINE SURGERY  10/2011   CESAREAN SECTION     DILATION AND CURETTAGE OF UTERUS  1986   KNEE ARTHROSCOPY Left 07/22/2013   Procedure: LEFT KNEE ARTHROSCOPY WITH DEBRIDEMENT CHONDROPLASTY, REMOVAL LOOSE BODIES, PARTIAL MEDIAL MENISCECTOMY, OPEN EXCISION OF PES GANGLION;  Surgeon: [name], MD;  Location: [facility];  Service: Orthopedics;  Laterality: Left;  NO SPECIMEN SENT PER DR. [name] ORDER.   KNEE SURGERY Left 11/21/2021   SPINE SURGERY  10/26/2019   TOTAL ABDOMINAL HYSTERECTOMY  05/2010   TAH   TUBAL LIGATION  1989   Patient Active Problem List   Diagnosis Date Noted   MDD (major depressive disorder), recurrent episode (HCC) 08/02/2022   Osteoarthritis of left knee 06/23/2022   Osteoarthritis of right acromioclavicular joint 12/07/2021   Arthritis 02/12/2021   Prediabetes 02/12/2021   Mixed stress and urge urinary incontinence  02/12/2021   Obesity (BMI 30.0-34.9) 02/12/2021   Low ferritin 02/10/2021   Vitamin D insufficiency 02/10/2021   Vitamin B12 deficiency 02/10/2021   COVID-19 long hauler 10/13/2020   Neuropathy 08/22/2020   Chronic pain 02/10/2016   Paget disease of bone 12/28/2015   Esophageal reflux 08/15/2015   Mixed hyperlipidemia 08/15/2015   Chronic pain in shoulder 08/15/2015   Anxiety and depression 11/25/2013   GAD (generalized anxiety disorder) 11/25/2013   IBS (irritable bowel syndrome) 01/02/2013    PCP: Sonny Masters, FNP  REFERRING PROVIDER: Ollen Gross, MD   REFERRING DIAG: Osteoarthritis of left knee joint  THERAPY DIAG:  Acute pain of right knee  Stiffness of right knee, not elsewhere classified  Localized edema  Rationale for Evaluation and Treatment: Rehabilitation  ONSET DATE: 10/15/23  SUBJECTIVE:   SUBJECTIVE STATEMENT: No new complaints.  PERTINENT HISTORY: Neuropathy, Paget disease, arthritis, anxiety, depression, chronic pain, and history of COVID-19 PAIN:  Are you having pain? Yes: NPRS scale: 7/10 Pain location: left thigh and knee Pain description: constant aching and throbbing with intermittent soreness Aggravating factors: getting into and out of bed Relieving factors: laying down, ice, and medication  PRECAUTIONS: Fall (due to poor vision)   RED FLAGS: None   WEIGHT BEARING RESTRICTIONS: No  FALLS:  Has patient fallen in  last 6 months? No  LIVING ENVIRONMENT: Lives with: lives with an adult companion Lives in: House/apartment Stairs: Yes: External: 3 steps; none Has following equipment at home: Walker - 2 wheeled  OCCUPATION: disabled  PLOF: Independent  PATIENT GOALS: reduced pain, improved, be able to watch her granddaughter, and improved sleep  NEXT MD VISIT: 10/29/23  OBJECTIVE:  Note: Objective measures were completed at Evaluation unless otherwise noted.  PATIENT SURVEYS:  FOTO 21.03  COGNITION: Overall cognitive  status: Within functional limits for tasks assessed     SENSATION: Patient reports left lateral knee numbness since surgery.   EDEMA:  Circumferential: L tibiofemoral joint line: 46.5 cm R tibiofemoral joint line: 39.0 cm  PALPATION: TTP: right quadriceps, IT band, and lateral knee  LOWER EXTREMITY ROM:  Active ROM Right eval Left eval  Hip flexion    Hip extension    Hip abduction    Hip adduction    Hip internal rotation    Hip external rotation    Knee flexion 140 64/ 38 (PROM)   Knee extension 0 21  Ankle dorsiflexion    Ankle plantarflexion    Ankle inversion    Ankle eversion     (Blank rows = not tested)  LOWER EXTREMITY MMT: not tested due to surgical condition  LOWER EXTREMITY SPECIAL TESTS:  Not tested due to surgical condition  GAIT: Assistive device utilized: Environmental consultant - 2 wheeled Level of assistance: Modified independence Comments: decreased gait speed and stride length with left knee flexed in stance phase                                                                                                                                TREATMENT DATE:    10/24/23:                                       EXERCISE LOG  Exercise Repetitions and Resistance Comments  Nustep Level 3 x 16 minutes moving forward x 2 to increase knee flexion.   Rockerboard  5 minutes   Knee glide in supine 5 minutes   In supine:  2 minute sustained left knee overpressure stretch (low load) f/b LE elevation and vasopneumatic on low to patient's left knee x 15 minutes.     10/22/23:                                     EXERCISE LOG  Exercise Repetitions and Resistance Comments  Nustep Level 1 moving seat forward x 1 to increase knee flexion.   Rockerboard in parallel bars 3 minutes   SAQ's 1# x 3 minutes           In supine:  Gentle stretching into left knee flexion and extension x 7 minutes f/b  LE elevation and vasopneumatic on low to patient's left knee x 20 minutes.      PATIENT EDUCATION:  Education details: plan of care, prognosis, healing, objective findings, expectations for soreness, and goals for physical therapy Person educated: Patient and significant other Education method: Explanation Education comprehension: verbalized understanding  HOME EXERCISE PROGRAM:   ASSESSMENT:  CLINICAL IMPRESSION: Patient did well today.  CC of pain is in the region of her left knee medial joint line.  Her range of motion is improving nicely.    OBJECTIVE IMPAIRMENTS: Abnormal gait, decreased activity tolerance, decreased balance, decreased mobility, difficulty walking, decreased ROM, decreased strength, hypomobility, increased edema, impaired flexibility, impaired sensation, impaired tone, and pain.   ACTIVITY LIMITATIONS: carrying, lifting, standing, squatting, sleeping, stairs, transfers, dressing, locomotion level, and caring for others  PARTICIPATION LIMITATIONS: meal prep, cleaning, laundry, driving, shopping, and community activity  PERSONAL FACTORS: Past/current experiences, Transportation, and 3+ comorbidities: Neuropathy, Paget disease, arthritis, anxiety, depression, chronic pain, and history of COVID-19  are also affecting patient's functional outcome.   REHAB POTENTIAL: Good  CLINICAL DECISION MAKING: Evolving/moderate complexity  EVALUATION COMPLEXITY: Moderate   GOALS: Goals reviewed with patient? Yes  LONG TERM GOALS: Target date: 11/15/23  Patient will be independent with her HEP.  Baseline:  Goal status: INITIAL  2.  Patient will improve her active left knee extension within 5 degrees of neutral for improved gait mechanics.  Baseline:  Goal status: INITIAL  3.  Patient will improve her right knee flexion to at least 120 degrees for improved function navigating stairs.  Baseline:  Goal status: INITIAL  4.  Patient will be able to safely navigate with a cane or the least restrictive assistive device for improved household  mobility.  Baseline:  Goal status: INITIAL  5.  Patient will be able to navigate at least 3 steps with a reciprocal pattern for improved household mobility.  Baseline:  Goal status: INITIAL  PLAN:  PT FREQUENCY: 2-3x/week  PT DURATION: 4 weeks  PLANNED INTERVENTIONS: 97164- PT Re-evaluation, 97110-Therapeutic exercises, 97530- Therapeutic activity, 97112- Neuromuscular re-education, 97535- Self Care, 56213- Manual therapy, 97116- Gait training, 97014- Electrical stimulation (unattended), 97016- Vasopneumatic device, Patient/Family education, Balance training, Stair training, Joint mobilization, Cryotherapy, and Moist heat  PLAN FOR NEXT SESSION: Nustep, quad sets, heel slides, PROM, and modalities as needed   Reya Aurich, [place], PT 10/24/2023, 4:42 PM

## 2023-10-28 ENCOUNTER — Other Ambulatory Visit: Payer: Self-pay | Admitting: Family Medicine

## 2023-10-28 ENCOUNTER — Ambulatory Visit: Payer: Medicare HMO | Admitting: Physical Therapy

## 2023-10-28 DIAGNOSIS — M1990 Unspecified osteoarthritis, unspecified site: Secondary | ICD-10-CM

## 2023-10-28 DIAGNOSIS — G629 Polyneuropathy, unspecified: Secondary | ICD-10-CM

## 2023-10-28 DIAGNOSIS — M25562 Pain in left knee: Secondary | ICD-10-CM | POA: Diagnosis not present

## 2023-10-28 DIAGNOSIS — M25561 Pain in right knee: Secondary | ICD-10-CM | POA: Diagnosis not present

## 2023-10-28 DIAGNOSIS — E782 Mixed hyperlipidemia: Secondary | ICD-10-CM

## 2023-10-28 DIAGNOSIS — R600 Localized edema: Secondary | ICD-10-CM

## 2023-10-28 DIAGNOSIS — M25662 Stiffness of left knee, not elsewhere classified: Secondary | ICD-10-CM | POA: Diagnosis not present

## 2023-10-28 DIAGNOSIS — M25661 Stiffness of right knee, not elsewhere classified: Secondary | ICD-10-CM | POA: Diagnosis not present

## 2023-10-28 NOTE — Therapy (Signed)
OUTPATIENT PHYSICAL THERAPY LOWER EXTREMITY   Patient Name: CAROLEANN CASLER MRN: [id number] DOB:November 03, 1960, 63 y.o., female Today's Date: 10/28/2023  END OF SESSION:  PT End of Session - 10/28/23 1513     Visit Number 4    Number of Visits 8    Date for PT Re-Evaluation 11/15/23    PT Start Time 0305    PT Stop Time 0356    PT Time Calculation (min) 51 min    Activity Tolerance Patient tolerated treatment well    Behavior During Therapy [facility] for tasks assessed/performed             Past Medical History:  Diagnosis Date   Anxiety    Arthritis    Complication of anesthesia    slow to wake up-does not take much medicine to sedate her   Depression    Esophageal reflux    Hyperlipidemia    Low ferritin 02/10/2021   Paget disease of bone    Pneumonia due to COVID-19 virus 05/20/2020   Vitamin B12 deficiency 02/10/2021   Vitamin D insufficiency 02/10/2021   Wears glasses    Past Surgical History:  Procedure Laterality Date   ANTERIOR FUSION CERVICAL SPINE  10/2019   C3-C5   BREAST CYST EXCISION Left    CERVICAL SPINE SURGERY  10/2011   CESAREAN SECTION     DILATION AND CURETTAGE OF UTERUS  1986   KNEE ARTHROSCOPY Left 07/22/2013   Procedure: LEFT KNEE ARTHROSCOPY WITH DEBRIDEMENT CHONDROPLASTY, REMOVAL LOOSE BODIES, PARTIAL MEDIAL MENISCECTOMY, OPEN EXCISION OF PES GANGLION;  Surgeon: [name], MD;  Location: [facility];  Service: Orthopedics;  Laterality: Left;  NO SPECIMEN SENT PER DR. [name] ORDER.   KNEE SURGERY Left 11/21/2021   SPINE SURGERY  10/26/2019   TOTAL ABDOMINAL HYSTERECTOMY  05/2010   TAH   TUBAL LIGATION  1989   Patient Active Problem List   Diagnosis Date Noted   MDD (major depressive disorder), recurrent episode (HCC) 08/02/2022   Osteoarthritis of left knee 06/23/2022   Osteoarthritis of right acromioclavicular joint 12/07/2021   Arthritis 02/12/2021   Prediabetes 02/12/2021   Mixed stress and urge urinary incontinence  02/12/2021   Obesity (BMI 30.0-34.9) 02/12/2021   Low ferritin 02/10/2021   Vitamin D insufficiency 02/10/2021   Vitamin B12 deficiency 02/10/2021   COVID-19 long hauler 10/13/2020   Neuropathy 08/22/2020   Chronic pain 02/10/2016   Paget disease of bone 12/28/2015   Esophageal reflux 08/15/2015   Mixed hyperlipidemia 08/15/2015   Chronic pain in shoulder 08/15/2015   Anxiety and depression 11/25/2013   GAD (generalized anxiety disorder) 11/25/2013   IBS (irritable bowel syndrome) 01/02/2013    PCP: Sonny Masters, FNP  REFERRING PROVIDER: Ollen Gross, MD   REFERRING DIAG: Osteoarthritis of left knee joint  THERAPY DIAG:  Stiffness of right knee, not elsewhere classified  Localized edema  Acute pain of left knee  Rationale for Evaluation and Treatment: Rehabilitation  ONSET DATE: 10/15/23  SUBJECTIVE:   SUBJECTIVE STATEMENT: Pian around a 7. PERTINENT HISTORY: Neuropathy, Paget disease, arthritis, anxiety, depression, chronic pain, and history of COVID-19 PAIN:  Are you having pain? Yes: NPRS scale: 7/10 Pain location: left thigh and knee Pain description: constant aching and throbbing with intermittent soreness Aggravating factors: getting into and out of bed Relieving factors: laying down, ice, and medication  PRECAUTIONS: Fall (due to poor vision)   RED FLAGS: None   WEIGHT BEARING RESTRICTIONS: No  FALLS:  Has patient fallen in  last 6 months? No  LIVING ENVIRONMENT: Lives with: lives with an adult companion Lives in: House/apartment Stairs: Yes: External: 3 steps; none Has following equipment at home: Walker - 2 wheeled  OCCUPATION: disabled  PLOF: Independent  PATIENT GOALS: reduced pain, improved, be able to watch her granddaughter, and improved sleep  NEXT MD VISIT: 10/29/23  OBJECTIVE:  Note: Objective measures were completed at Evaluation unless otherwise noted.  PATIENT SURVEYS:  FOTO 21.03  COGNITION: Overall cognitive status:  Within functional limits for tasks assessed     SENSATION: Patient reports left lateral knee numbness since surgery.   EDEMA:  Circumferential: L tibiofemoral joint line: 46.5 cm R tibiofemoral joint line: 39.0 cm  PALPATION: TTP: right quadriceps, IT band, and lateral knee  LOWER EXTREMITY ROM:  Active ROM Right eval Left eval Left 10/28/23  Hip flexion     Hip extension     Hip abduction     Hip adduction     Hip internal rotation     Hip external rotation     Knee flexion 140 64/ 38 (PROM)  100  Knee extension 0 21   Ankle dorsiflexion     Ankle plantarflexion     Ankle inversion     Ankle eversion      (Blank rows = not tested)  LOWER EXTREMITY MMT: not tested due to surgical condition  LOWER EXTREMITY SPECIAL TESTS:  Not tested due to surgical condition  GAIT: Assistive device utilized: Environmental consultant - 2 wheeled Level of assistance: Modified independence Comments: decreased gait speed and stride length with left knee flexed in stance phase                                                                                                                                TREATMENT DATE:               10/28/23:                        EXERCISE LOG  Exercise Repetitions and Resistance Comments  Nustep Level 3 starting at seat 8 and ending at seat 6 x 15 minutes   Rockerboard  Parallel bars x 3 minutes.   SAQ's 2# x 3 minutes           PROM into flex/ex x 7 minutes in supine and seated f/b LE elevation and vasopneumatic on low x 15 minutes.    10/24/23:                                       EXERCISE LOG  Exercise Repetitions and Resistance Comments  Nustep Level 3 x 16 minutes moving forward x 2 to increase knee flexion.   Rockerboard  5 minutes   Knee glide in supine 5 minutes   In supine:  2 minute sustained  left knee overpressure stretch (low load) f/b LE elevation and vasopneumatic on low to patient's left knee x 15 minutes.     10/22/23:                                      EXERCISE LOG  Exercise Repetitions and Resistance Comments  Nustep Level 1 moving seat forward x 1 to increase knee flexion.   Rockerboard in parallel bars 3 minutes   SAQ's 1# x 3 minutes           In supine:  Gentle stretching into left knee flexion and extension x 7 minutes f/b  LE elevation and vasopneumatic on low to patient's left knee x 20 minutes.     PATIENT EDUCATION:  Education details: plan of care, prognosis, healing, objective findings, expectations for soreness, and goals for physical therapy Person educated: Patient and significant other Education method: Explanation Education comprehension: verbalized understanding  HOME EXERCISE PROGRAM:   ASSESSMENT:  CLINICAL IMPRESSION: Patient is very motivated and progressing well.  She achieved passive left knee flexion to 100 degrees today.  OBJECTIVE IMPAIRMENTS: Abnormal gait, decreased activity tolerance, decreased balance, decreased mobility, difficulty walking, decreased ROM, decreased strength, hypomobility, increased edema, impaired flexibility, impaired sensation, impaired tone, and pain.   ACTIVITY LIMITATIONS: carrying, lifting, standing, squatting, sleeping, stairs, transfers, dressing, locomotion level, and caring for others  PARTICIPATION LIMITATIONS: meal prep, cleaning, laundry, driving, shopping, and community activity  PERSONAL FACTORS: Past/current experiences, Transportation, and 3+ comorbidities: Neuropathy, Paget disease, arthritis, anxiety, depression, chronic pain, and history of COVID-19  are also affecting patient's functional outcome.   REHAB POTENTIAL: Good  CLINICAL DECISION MAKING: Evolving/moderate complexity  EVALUATION COMPLEXITY: Moderate   GOALS: Goals reviewed with patient? Yes  LONG TERM GOALS: Target date: 11/15/23  Patient will be independent with her HEP.  Baseline:  Goal status: INITIAL  2.  Patient will improve her active left knee extension within 5 degrees  of neutral for improved gait mechanics.  Baseline:  Goal status: INITIAL  3.  Patient will improve her right knee flexion to at least 120 degrees for improved function navigating stairs.  Baseline:  Goal status: INITIAL  4.  Patient will be able to safely navigate with a cane or the least restrictive assistive device for improved household mobility.  Baseline:  Goal status: INITIAL  5.  Patient will be able to navigate at least 3 steps with a reciprocal pattern for improved household mobility.  Baseline:  Goal status: INITIAL  PLAN:  PT FREQUENCY: 2-3x/week  PT DURATION: 4 weeks  PLANNED INTERVENTIONS: 97164- PT Re-evaluation, 97110-Therapeutic exercises, 97530- Therapeutic activity, 97112- Neuromuscular re-education, 97535- Self Care, 81191- Manual therapy, 97116- Gait training, 97014- Electrical stimulation (unattended), 97016- Vasopneumatic device, Patient/Family education, Balance training, Stair training, Joint mobilization, Cryotherapy, and Moist heat  PLAN FOR NEXT SESSION: Nustep, quad sets, heel slides, PROM, and modalities as needed   Bionca Mckey, [place], PT 10/28/2023, 3:57 PM

## 2023-10-28 NOTE — Addendum Note (Signed)
Addended by: Granville Lewis on: 10/28/2023 03:22 PM   Modules accepted: Orders

## 2023-10-29 ENCOUNTER — Encounter: Payer: Medicare HMO | Admitting: Family Medicine

## 2023-10-31 ENCOUNTER — Ambulatory Visit: Payer: Medicare HMO | Admitting: Physical Therapy

## 2023-10-31 DIAGNOSIS — R600 Localized edema: Secondary | ICD-10-CM

## 2023-10-31 DIAGNOSIS — M25662 Stiffness of left knee, not elsewhere classified: Secondary | ICD-10-CM | POA: Diagnosis not present

## 2023-10-31 DIAGNOSIS — M25561 Pain in right knee: Secondary | ICD-10-CM | POA: Diagnosis not present

## 2023-10-31 DIAGNOSIS — M25661 Stiffness of right knee, not elsewhere classified: Secondary | ICD-10-CM | POA: Diagnosis not present

## 2023-10-31 DIAGNOSIS — M25562 Pain in left knee: Secondary | ICD-10-CM | POA: Diagnosis not present

## 2023-10-31 NOTE — Therapy (Signed)
OUTPATIENT PHYSICAL THERAPY LOWER EXTREMITY   Patient Name: Julia [name] MRN: [id number] DOB:Mar 03, 1961, 63 y.o., female Today's Date: 10/31/2023  END OF SESSION:  PT End of Session - 10/31/23 1510     Visit Number 5    Number of Visits 8    Date for PT Re-Evaluation 11/15/23    PT Start Time 0300    PT Stop Time 0349    PT Time Calculation (min) 49 min    Activity Tolerance Patient tolerated treatment well    Behavior During Therapy [facility] for tasks assessed/performed             Past Medical History:  Diagnosis Date   Anxiety    Arthritis    Complication of anesthesia    slow to wake up-does not take much medicine to sedate her   Depression    Esophageal reflux    Hyperlipidemia    Low ferritin 02/10/2021   Paget disease of bone    Pneumonia due to COVID-19 virus 05/20/2020   Vitamin B12 deficiency 02/10/2021   Vitamin D insufficiency 02/10/2021   Wears glasses    Past Surgical History:  Procedure Laterality Date   ANTERIOR FUSION CERVICAL SPINE  10/2019   C3-C5   BREAST CYST EXCISION Left    CERVICAL SPINE SURGERY  10/2011   CESAREAN SECTION     DILATION AND CURETTAGE OF UTERUS  1986   KNEE ARTHROSCOPY Left 07/22/2013   Procedure: LEFT KNEE ARTHROSCOPY WITH DEBRIDEMENT CHONDROPLASTY, REMOVAL LOOSE BODIES, PARTIAL MEDIAL MENISCECTOMY, OPEN EXCISION OF PES GANGLION;  Surgeon: [name], MD;  Location: [facility];  Service: Orthopedics;  Laterality: Left;  NO SPECIMEN SENT PER DR. [name] ORDER.   KNEE SURGERY Left 11/21/2021   SPINE SURGERY  10/26/2019   TOTAL ABDOMINAL HYSTERECTOMY  05/2010   TAH   TUBAL LIGATION  1989   Patient Active Problem List   Diagnosis Date Noted   MDD (major depressive disorder), recurrent episode (HCC) 08/02/2022   Osteoarthritis of left knee 06/23/2022   Osteoarthritis of right acromioclavicular joint 12/07/2021   Arthritis 02/12/2021   Prediabetes 02/12/2021   Mixed stress and urge urinary incontinence  02/12/2021   Obesity (BMI 30.0-34.9) 02/12/2021   Low ferritin 02/10/2021   Vitamin D insufficiency 02/10/2021   Vitamin B12 deficiency 02/10/2021   COVID-19 long hauler 10/13/2020   Neuropathy 08/22/2020   Chronic pain 02/10/2016   Paget disease of bone 12/28/2015   Esophageal reflux 08/15/2015   Mixed hyperlipidemia 08/15/2015   Chronic pain in shoulder 08/15/2015   Anxiety and depression 11/25/2013   GAD (generalized anxiety disorder) 11/25/2013   IBS (irritable bowel syndrome) 01/02/2013    PCP: Sonny Masters, FNP  REFERRING PROVIDER: Ollen Gross, MD   REFERRING DIAG: Osteoarthritis of left knee joint  THERAPY DIAG:  Stiffness of right knee, not elsewhere classified  Localized edema  Acute pain of left knee  Rationale for Evaluation and Treatment: Rehabilitation  ONSET DATE: 10/15/23  SUBJECTIVE:   SUBJECTIVE STATEMENT: Pain around a 9.  To doctor visit.  They were pleased.  PERTINENT HISTORY: Neuropathy, Paget disease, arthritis, anxiety, depression, chronic pain, and history of COVID-19 PAIN:  Are you having pain? Yes: NPRS scale: 9/10 Pain location: left thigh and knee Pain description: constant aching and throbbing with intermittent soreness Aggravating factors: getting into and out of bed Relieving factors: laying down, ice, and medication  PRECAUTIONS: Fall (due to poor vision)   RED FLAGS: None   WEIGHT BEARING  RESTRICTIONS: No  FALLS:  Has patient fallen in last 6 months? No  LIVING ENVIRONMENT: Lives with: lives with an adult companion Lives in: House/apartment Stairs: Yes: External: 3 steps; none Has following equipment at home: Walker - 2 wheeled  OCCUPATION: disabled  PLOF: Independent  PATIENT GOALS: reduced pain, improved, be able to watch her granddaughter, and improved sleep  NEXT MD VISIT: 10/29/23  OBJECTIVE:  Note: Objective measures were completed at Evaluation unless otherwise noted.  PATIENT SURVEYS:  FOTO  21.03  COGNITION: Overall cognitive status: Within functional limits for tasks assessed     SENSATION: Patient reports left lateral knee numbness since surgery.   EDEMA:  Circumferential: L tibiofemoral joint line: 46.5 cm R tibiofemoral joint line: 39.0 cm  PALPATION: TTP: right quadriceps, IT band, and lateral knee  LOWER EXTREMITY ROM:  Active ROM Right eval Left eval Left 10/28/23  Hip flexion     Hip extension     Hip abduction     Hip adduction     Hip internal rotation     Hip external rotation     Knee flexion 140 64/ 38 (PROM)  100  Knee extension 0 21   Ankle dorsiflexion     Ankle plantarflexion     Ankle inversion     Ankle eversion      (Blank rows = not tested)  LOWER EXTREMITY MMT: not tested due to surgical condition  LOWER EXTREMITY SPECIAL TESTS:  Not tested due to surgical condition  GAIT: Assistive device utilized: Environmental consultant - 2 wheeled Level of assistance: Modified independence Comments: decreased gait speed and stride length with left knee flexed in stance phase                                                                                                                                TREATMENT DATE:    10/31/23:                                       EXERCISE LOG  Exercise Repetitions and Resistance Comments  Nustep Level 4 starting at seat 7 and ending at seat 5 over 15 minutes.   Recumbent bike 5 minutes   Rockerboard 3 minutes   SAQ's 3# x 4 minutes       f/b LE elevation and vasopneumatic on low x 15 minutes.                                       EXERCISE LOG  Exercise Repetitions and Resistance Comments  Nustep Level 3 starting at seat 8 and ending at seat 6 x 15 minutes   Rockerboard  Parallel bars x 3 minutes.   SAQ's 2# x 3 minutes  PROM into flex/ex x 7 minutes in supine and seated f/b LE elevation and vasopneumatic on low x 15 minutes.    10/24/23:                                       EXERCISE  LOG  Exercise Repetitions and Resistance Comments  Nustep Level 3 x 16 minutes moving forward x 2 to increase knee flexion.   Rockerboard  5 minutes   Knee glide in supine 5 minutes   In supine:  2 minute sustained left knee overpressure stretch (low load) f/b LE elevation and vasopneumatic on low to patient's left knee x 15 minutes.     10/22/23:                                     EXERCISE LOG  Exercise Repetitions and Resistance Comments  Nustep Level 1 moving seat forward x 1 to increase knee flexion.   Rockerboard in parallel bars 3 minutes   SAQ's 1# x 3 minutes           In supine:  Gentle stretching into left knee flexion and extension x 7 minutes f/b  LE elevation and vasopneumatic on low to patient's left knee x 20 minutes.     PATIENT EDUCATION:  Education details: plan of care, prognosis, healing, objective findings, expectations for soreness, and goals for physical therapy Person educated: Patient and significant other Education method: Explanation Education comprehension: verbalized understanding  HOME EXERCISE PROGRAM:   ASSESSMENT:  CLINICAL IMPRESSION: Patient did very well and performed recumbent bike at seat 4 making several backward and forward revolutions.    OBJECTIVE IMPAIRMENTS: Abnormal gait, decreased activity tolerance, decreased balance, decreased mobility, difficulty walking, decreased ROM, decreased strength, hypomobility, increased edema, impaired flexibility, impaired sensation, impaired tone, and pain.   ACTIVITY LIMITATIONS: carrying, lifting, standing, squatting, sleeping, stairs, transfers, dressing, locomotion level, and caring for others  PARTICIPATION LIMITATIONS: meal prep, cleaning, laundry, driving, shopping, and community activity  PERSONAL FACTORS: Past/current experiences, Transportation, and 3+ comorbidities: Neuropathy, Paget disease, arthritis, anxiety, depression, chronic pain, and history of COVID-19  are also affecting  patient's functional outcome.   REHAB POTENTIAL: Good  CLINICAL DECISION MAKING: Evolving/moderate complexity  EVALUATION COMPLEXITY: Moderate   GOALS: Goals reviewed with patient? Yes  LONG TERM GOALS: Target date: 11/15/23  Patient will be independent with her HEP.  Baseline:  Goal status: INITIAL  2.  Patient will improve her active left knee extension within 5 degrees of neutral for improved gait mechanics.  Baseline:  Goal status: INITIAL  3.  Patient will improve her right knee flexion to at least 120 degrees for improved function navigating stairs.  Baseline:  Goal status: INITIAL  4.  Patient will be able to safely navigate with a cane or the least restrictive assistive device for improved household mobility.  Baseline:  Goal status: INITIAL  5.  Patient will be able to navigate at least 3 steps with a reciprocal pattern for improved household mobility.  Baseline:  Goal status: INITIAL  PLAN:  PT FREQUENCY: 2-3x/week  PT DURATION: 4 weeks  PLANNED INTERVENTIONS: 97164- PT Re-evaluation, 97110-Therapeutic exercises, 97530- Therapeutic activity, 97112- Neuromuscular re-education, 97535- Self Care, 33295- Manual therapy, [phone]- Gait training, 97014- Electrical stimulation (unattended), [phone]- Vasopneumatic device, Patient/Family education, Balance training, Stair training, Joint mobilization, Cryotherapy,  and Moist heat  PLAN FOR NEXT SESSION: Nustep, quad sets, heel slides, PROM, and modalities as needed   Ridge Lafond, [place], PT 10/31/2023, 3:57 PM
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
## 2023-11-01 ENCOUNTER — Telehealth (HOSPITAL_BASED_OUTPATIENT_CLINIC_OR_DEPARTMENT_OTHER): Payer: Medicare HMO | Admitting: Psychiatry

## 2023-11-01 ENCOUNTER — Encounter (HOSPITAL_COMMUNITY): Payer: Self-pay | Admitting: Psychiatry

## 2023-11-01 DIAGNOSIS — F411 Generalized anxiety disorder: Secondary | ICD-10-CM | POA: Diagnosis not present

## 2023-11-01 DIAGNOSIS — F331 Major depressive disorder, recurrent, moderate: Secondary | ICD-10-CM | POA: Diagnosis not present

## 2023-11-01 MED ORDER — DULOXETINE HCL 60 MG PO CPEP: 60.0000 mg | ORAL_CAPSULE | Freq: Two times a day (BID) | ORAL | 0 refills | Status: DC

## 2023-11-01 MED ORDER — TRAZODONE HCL 50 MG PO TABS: 25.0000 mg | ORAL_TABLET | Freq: Every day | ORAL | 1 refills | Status: DC

## 2023-11-04 ENCOUNTER — Ambulatory Visit (HOSPITAL_COMMUNITY): Payer: Medicare HMO | Admitting: Clinical

## 2023-11-04 ENCOUNTER — Encounter (HOSPITAL_COMMUNITY): Payer: Self-pay | Admitting: Clinical

## 2023-11-04 DIAGNOSIS — F331 Major depressive disorder, recurrent, moderate: Secondary | ICD-10-CM | POA: Diagnosis not present

## 2023-11-04 DIAGNOSIS — F411 Generalized anxiety disorder: Secondary | ICD-10-CM | POA: Diagnosis not present

## 2023-11-04 NOTE — Progress Notes (Signed)
THERAPIST PROGRESS NOTE  Session Time: 1:53-2:53pm  Session #16  Participation Level: Active  Behavioral Response: Casual Alert Euthymic and Irritable  Type of Therapy: Individual Therapy  Goals addressed:  Goal: LTG: Lexee will score less than 5 on the Generalized Anxiety Disorder 7 Scale (GAD-7) Goal: STG: Lacara will reduce frequency of avoidant behaviors by 50% as evidenced by self-report in therapy sessions Goal: LTG: Learn breathing techniques and grounding techniques at an age-appropriate level and demonstrate mastery in session then report independent use of these skills out of session. Goal: STG: Learn emotion regulation strategies and practice using them Goal: LTG: Reduce frequency, intensity, and duration of depression symptoms so that daily functioning is improved Goal: STG: [name] will score less than 9 on the Patient Health Questionnaire (PHQ-9) Goal: LTG: Identify and decrease cognitive distortions contributing negatively to mood and behavior by identifying 5-7 cognitive distortions Abbigael has and learning how to come up with replacement thoughts that are more balanced, realistic, and helpful. Goal: STG: Learn a variety of coping skills and demonstrate the ability to use them to decrease feelings of sadness, anger, and fear and increase feelings of happiness, peace, and powerfulness AEB gauging those emotions on 1-10 scale. Goal: Artemis will increase her resilience through application of CBT techniques and through processing of her life in a shame framework Goal: Gabriellia will learn about additional communication techniques, practice these with her partner and family, and report improved communication within these relationships. Goal: LTG: Learn about boundary types, how to implement them, and how to enforce them so that feels more empowered and content with being able to maintain more helpful, appropriate boundaries in the future for a more balanced result.  ProgressTowards Goals:  Progressing  Interventions: Supportive, Reframing, and Other: communication    Summary: RAKIYA KRAWCZYK is a 63 y.o. female who presents with anxiety and depression for therapy. She presented oriented x5 and was walking with a walker in a much more even, obviously less painful gait than prior to her surgery. CSW evaluated patient's medication compliance, use of coping tools, and self-care, as applicable.   She is doing everything at home that Physical Therapy assigns, so is improving at a rapid rate.  Most of session was spent with her complaints about husband returning to normal in the manner in which he ignores her, stays on the phone with other people constantly, does not do the things she asks him to do in a timely fashion, and such.  She admitted when asked that she has not told him that she still needs him.  Tomorrow will be the 3-week mark from the actual surgery and she is already doing his laundry, for instance.  CSW spoke with her, as has been done on multiple other occasions, about the fact that he does not do these things for himself because he knows she will eventually do them. She looks sheepish at this, but is unable to agree to make any changes in enabling him.  She acknowledged that he does some things for her still, saying that he does meet her needs, but not her wants.  In fact, if she really needs something, she will now ask for his undivided attention, actually makes him put down his phone.  CSW asked about any possibility of him being hampered by ADHD, which she agreed is a possibility.  CSW informed her that the approach simply has to be different if that is the case.  As we explored throughout the session, she finally stated that  she cannot communicate any of her feelings to him because to him, "I'm invisible."  This was processed for the remainder of the session and she was asked to continue to think about what this means until next session.  She did not hear from her son in [place]  for 2+ weeks after the surgery, which made her think he was upset with her.  The idea of her feeling invisible should continue to be addressed in future sessions.  Suicidal/Homicidal: No without intent/plan  Therapist Response:  Patient is progressing AEB engaging in scheduled therapy session.  Throughout the session, CSW gave patient the opportunity to explore thoughts and feelings associated with current life situations and past/present stressors.   CSW challenged patient gently and appropriately to consider different ways of looking at reported issues. CSW encouraged patient's expression of feelings and validated these using empathy, active listening, open body language, and unconditional positive regard.   CSW encouraged patient to schedule more therapy sessions for the future, as needed.  We discussed her participating in groups every other week until next appointment on 3/20 and she is agreeable.  She was ready to participate two weeks ago when her sons arrived to visit and she rarely sees them, so she opted to do that.    Recommendations:  Return to therapy in 2 weeks, continue to consider what she would need to do in her relationship to not feel invisible  Plan: Return again in 2 weeks for group, on 2/19  Diagnosis:  Moderate episode of recurrent major depressive disorder (HCC)  GAD (generalized anxiety disorder)  Collaboration of Care: Psychiatrist AEB -   psychiatrist can read therapy notes; therapist can and does read psychiatric notes prior to sessions   Patient/Guardian was advised Release of Information must be obtained prior to any record release in order to collaborate their care with an outside provider. Patient/Guardian was advised if they have not already done so to contact the registration department to sign all necessary forms in order for [place] to release information regarding their care.   Consent: Patient/Guardian gives verbal consent for treatment and assignment of benefits  for services provided during this visit. Patient/Guardian expressed understanding and agreed to proceed.      09/23/2023    2:49 PM 09/12/2023    3:28 PM 08/07/2023   11:00 AM 05/13/2023    8:21 AM 04/25/2023   10:22 AM 11/23/2022   11:16 AM  Depression screen PHQ 2/9  Decreased Interest 3 3 2 1 3 3   Down, Depressed, Hopeless 3 2 2 1 3 3   PHQ - 2 Score 6 5 4 2 6 6   Altered sleeping 3 3 3 3 3 3   Tired, decreased energy 3 3 3 3 3 3   Change in appetite 2 3 3 1 3 3   Feeling bad or failure about yourself  3 2 2 3 2 3   Trouble concentrating 2 2 2 3 1 2   Moving slowly or fidgety/restless 2 2 2  0 1 1  Suicidal thoughts 0 0 0 0 0 0  PHQ-9 Score 21 20 19 15 19 21   Difficult doing work/chores  Very difficult Extremely dIfficult  Not difficult at all Very difficult      09/23/2023    2:52 PM 09/12/2023    3:28 PM 08/07/2023   11:00 AM 04/25/2023   10:23 AM 11/23/2022   11:17 AM  GAD 7 : Generalized Anxiety Score  Nervous, Anxious, on Edge 3 3 3 3 3   Control/stop worrying  3 3 3 3 3   Worry too much - different things 3 3 3 3 3   Trouble relaxing 3 2 2 3 2   Restless 2 2 2 3 2   Easily annoyed or irritable 3 2 2 3 2   Afraid - awful might happen 3 2 2 2 2   Total GAD 7 Score 20 17 17 20 17   Anxiety Difficulty Very difficult Extremely difficult Very difficult Not difficult at all Very difficult      Lynnell Chad, LCSW 11/04/2023

## 2023-11-05 ENCOUNTER — Ambulatory Visit: Payer: Medicare HMO | Attending: Orthopedic Surgery

## 2023-11-05 DIAGNOSIS — M25562 Pain in left knee: Secondary | ICD-10-CM | POA: Diagnosis not present

## 2023-11-05 DIAGNOSIS — M25662 Stiffness of left knee, not elsewhere classified: Secondary | ICD-10-CM | POA: Diagnosis not present

## 2023-11-05 DIAGNOSIS — R600 Localized edema: Secondary | ICD-10-CM | POA: Diagnosis not present

## 2023-11-05 NOTE — Therapy (Signed)
 OUTPATIENT PHYSICAL THERAPY LOWER EXTREMITY   Patient Name: REGINAE WOLFREY MRN: [id number] DOB:09/15/1961, 63 y.o., female Today's Date: 11/05/2023  END OF SESSION:  PT End of Session - 11/05/23 1623     Visit Number 6    Number of Visits 8    Date for PT Re-Evaluation 11/15/23    PT Start Time 1600    PT Stop Time 1700    PT Time Calculation (min) 60 min    Activity Tolerance Patient tolerated treatment well    Behavior During Therapy [facility] for tasks assessed/performed              Past Medical History:  Diagnosis Date   Anxiety    Arthritis    Complication of anesthesia    slow to wake up-does not take much medicine to sedate her   Depression    Esophageal reflux    Hyperlipidemia    Low ferritin 02/10/2021   Paget disease of bone    Pneumonia due to COVID-19 virus 05/20/2020   Vitamin B12 deficiency 02/10/2021   Vitamin D  insufficiency 02/10/2021   Wears glasses    Past Surgical History:  Procedure Laterality Date   ANTERIOR FUSION CERVICAL SPINE  10/2019   C3-C5   BREAST CYST EXCISION Left    CERVICAL SPINE SURGERY  10/2011   CESAREAN SECTION     DILATION AND CURETTAGE OF UTERUS  1986   KNEE ARTHROSCOPY Left 07/22/2013   Procedure: LEFT KNEE ARTHROSCOPY WITH DEBRIDEMENT CHONDROPLASTY, REMOVAL LOOSE BODIES, PARTIAL MEDIAL MENISCECTOMY, OPEN EXCISION OF PES GANGLION;  Surgeon: Dempsey JINNY Sensor, MD;  Location: [facility];  Service: Orthopedics;  Laterality: Left;  NO SPECIMEN SENT PER DR. SENSOR ORDER.   KNEE SURGERY Left 11/21/2021   SPINE SURGERY  10/26/2019   TOTAL ABDOMINAL HYSTERECTOMY  05/2010   TAH   TUBAL LIGATION  1989   Patient Active Problem List   Diagnosis Date Noted   MDD (major depressive disorder), recurrent episode (HCC) 08/02/2022   Osteoarthritis of left knee 06/23/2022   Osteoarthritis of right acromioclavicular joint 12/07/2021   Arthritis 02/12/2021   Prediabetes 02/12/2021   Mixed stress and urge urinary incontinence  02/12/2021   Obesity (BMI 30.0-34.9) 02/12/2021   Low ferritin 02/10/2021   Vitamin D  insufficiency 02/10/2021   Vitamin B12 deficiency 02/10/2021   COVID-19 long hauler 10/13/2020   Neuropathy 08/22/2020   Chronic pain 02/10/2016   Paget disease of bone 12/28/2015   Esophageal reflux 08/15/2015   Mixed hyperlipidemia 08/15/2015   Chronic pain in shoulder 08/15/2015   Anxiety and depression 11/25/2013   GAD (generalized anxiety disorder) 11/25/2013   IBS (irritable bowel syndrome) 01/02/2013    PCP: Severa Rock CHRISTELLA, FNP  REFERRING PROVIDER: Melodi Dempsey, MD   REFERRING DIAG: Osteoarthritis of left knee joint  THERAPY DIAG:  Acute pain of left knee  Localized edema  Stiffness of left knee, not elsewhere classified  Rationale for Evaluation and Treatment: Rehabilitation  ONSET DATE: 10/15/23  SUBJECTIVE:   SUBJECTIVE STATEMENT: Pain around a 9.  To doctor visit.  They were pleased.  PERTINENT HISTORY: Neuropathy, Paget disease, arthritis, anxiety, depression, chronic pain, and history of COVID-19 PAIN:  Are you having pain? Yes: NPRS scale: 9/10 Pain location: left thigh and knee Pain description: constant aching and throbbing with intermittent soreness Aggravating factors: getting into and out of bed Relieving factors: laying down, ice, and medication  PRECAUTIONS: Fall (due to poor vision)   RED FLAGS: None   WEIGHT  BEARING RESTRICTIONS: No  FALLS:  Has patient fallen in last 6 months? No  LIVING ENVIRONMENT: Lives with: lives with an adult companion Lives in: House/apartment Stairs: Yes: External: 3 steps; none Has following equipment at home: Walker - 2 wheeled  OCCUPATION: disabled  PLOF: Independent  PATIENT GOALS: reduced pain, improved, be able to watch her granddaughter, and improved sleep  NEXT MD VISIT: 10/29/23  OBJECTIVE:  Note: Objective measures were completed at Evaluation unless otherwise noted.  PATIENT SURVEYS:  FOTO  21.03  COGNITION: Overall cognitive status: Within functional limits for tasks assessed     SENSATION: Patient reports left lateral knee numbness since surgery.   EDEMA:  Circumferential: L tibiofemoral joint line: 46.5 cm R tibiofemoral joint line: 39.0 cm  PALPATION: TTP: right quadriceps, IT band, and lateral knee  LOWER EXTREMITY ROM:  Active ROM Right eval Left eval Left 10/28/23  Hip flexion     Hip extension     Hip abduction     Hip adduction     Hip internal rotation     Hip external rotation     Knee flexion 140 64/ 38 (PROM)  100  Knee extension 0 21   Ankle dorsiflexion     Ankle plantarflexion     Ankle inversion     Ankle eversion      (Blank rows = not tested)  LOWER EXTREMITY MMT: not tested due to surgical condition  LOWER EXTREMITY SPECIAL TESTS:  Not tested due to surgical condition  GAIT: Assistive device utilized: [employer] - 2 wheeled Level of assistance: Modified independence Comments: decreased gait speed and stride length with left knee flexed in stance phase                                                                                                                                TREATMENT DATE:                                     11/05/23 EXERCISE LOG  Exercise Repetitions and Resistance Comments  Nustep L4 x 8 minutes; seat 6   Recumbent bike  L1 x 11 minutes; seat 6   Rocker board  4 minutes    Lunges onto step  14 step x 4 minutes For L knee flexion  LAQ 2 minutes  LLE only   Step up  6 step x 20 reps 8step x 20 reps LLE leading   Blank cell = exercise not performed today  Modalities: no adverse reaction to today's modalities  Date:  Vaso: Knee, 34 degrees; low pressure, 15 mins, Pain and Tone  10/31/23:   EXERCISE LOG  Exercise Repetitions and Resistance Comments  Nustep Level 4 starting at seat 7 and ending at seat 5 over 15 minutes.   Recumbent bike 5 minutes   Rockerboard 3 minutes   SAQ's 3# x  4 minutes        f/b LE elevation and vasopneumatic on low x 15 minutes.                                       EXERCISE LOG  Exercise Repetitions and Resistance Comments  Nustep Level 3 starting at seat 8 and ending at seat 6 x 15 minutes   Rockerboard  Parallel bars x 3 minutes.   SAQ's 2# x 3 minutes           PROM into flex/ex x 7 minutes in supine and seated f/b LE elevation and vasopneumatic on low x 15 minutes.    PATIENT EDUCATION:  Education details: healing and goals for physical therapy Person educated: Patient Education method: Explanation Education comprehension: verbalized understanding  HOME EXERCISE PROGRAM:   ASSESSMENT:  CLINICAL IMPRESSION: Patient was introduced to step ups and lunges onto a step for improved functional mobility. She required minimal cueing with lunges onto a step for proper positioning to facilitate improved knee flexion. She experienced no significant increase in her familiar symptoms with any of today's interventions. She reported that her knee felt sore upon the conclusion of treatment. She continues to require skilled physical therapy to address her remaining impairments to return to her prior level of function.   OBJECTIVE IMPAIRMENTS: Abnormal gait, decreased activity tolerance, decreased balance, decreased mobility, difficulty walking, decreased ROM, decreased strength, hypomobility, increased edema, impaired flexibility, impaired sensation, impaired tone, and pain.   ACTIVITY LIMITATIONS: carrying, lifting, standing, squatting, sleeping, stairs, transfers, dressing, locomotion level, and caring for others  PARTICIPATION LIMITATIONS: meal prep, cleaning, laundry, driving, shopping, and community activity  PERSONAL FACTORS: Past/current experiences, Transportation, and 3+ comorbidities: Neuropathy, Paget disease, arthritis, anxiety, depression, chronic pain, and history of COVID-19  are also affecting patient's functional outcome.   REHAB POTENTIAL:  Good  CLINICAL DECISION MAKING: Evolving/moderate complexity  EVALUATION COMPLEXITY: Moderate   GOALS: Goals reviewed with patient? Yes  LONG TERM GOALS: Target date: 11/15/23  Patient will be independent with her HEP.  Baseline:  Goal status: INITIAL  2.  Patient will improve her active left knee extension within 5 degrees of neutral for improved gait mechanics.  Baseline:  Goal status: INITIAL  3.  Patient will improve her right knee flexion to at least 120 degrees for improved function navigating stairs.  Baseline:  Goal status: INITIAL  4.  Patient will be able to safely navigate with a cane or the least restrictive assistive device for improved household mobility.  Baseline:  Goal status: INITIAL  5.  Patient will be able to navigate at least 3 steps with a reciprocal pattern for improved household mobility.  Baseline:  Goal status: INITIAL  PLAN:  PT FREQUENCY: 2-3x/week  PT DURATION: 4 weeks  PLANNED INTERVENTIONS: 97164- PT Re-evaluation, 97110-Therapeutic exercises, 97530- Therapeutic activity, 97112- Neuromuscular re-education, 97535- Self Care, [id number]- Manual therapy, [phone]- Gait training, 97014- Electrical stimulation (unattended), 97016- Vasopneumatic device, Patient/Family education, Balance training, Stair training, Joint mobilization, Cryotherapy, and Moist heat  PLAN FOR NEXT SESSION: Nustep, quad sets, heel slides, PROM, and modalities as needed   Lacinda JAYSON Fass, PT 11/05/2023, 5:09 PM

## 2023-11-06 ENCOUNTER — Encounter: Payer: Self-pay | Admitting: Family Medicine

## 2023-11-06 ENCOUNTER — Ambulatory Visit (INDEPENDENT_AMBULATORY_CARE_PROVIDER_SITE_OTHER): Payer: Medicare HMO | Admitting: Family Medicine

## 2023-11-06 VITALS — BP 102/73 | HR 81 | Temp 97.9°F | Ht 61.0 in | Wt 162.0 lb

## 2023-11-06 DIAGNOSIS — E538 Deficiency of other specified B group vitamins: Secondary | ICD-10-CM

## 2023-11-06 DIAGNOSIS — F411 Generalized anxiety disorder: Secondary | ICD-10-CM

## 2023-11-06 DIAGNOSIS — E782 Mixed hyperlipidemia: Secondary | ICD-10-CM

## 2023-11-06 DIAGNOSIS — G629 Polyneuropathy, unspecified: Secondary | ICD-10-CM | POA: Diagnosis not present

## 2023-11-06 DIAGNOSIS — M1990 Unspecified osteoarthritis, unspecified site: Secondary | ICD-10-CM | POA: Diagnosis not present

## 2023-11-06 DIAGNOSIS — R7303 Prediabetes: Secondary | ICD-10-CM | POA: Diagnosis not present

## 2023-11-06 DIAGNOSIS — F331 Major depressive disorder, recurrent, moderate: Secondary | ICD-10-CM

## 2023-11-06 DIAGNOSIS — Z96652 Presence of left artificial knee joint: Secondary | ICD-10-CM

## 2023-11-06 DIAGNOSIS — E559 Vitamin D deficiency, unspecified: Secondary | ICD-10-CM

## 2023-11-06 MED ORDER — ATORVASTATIN CALCIUM 40 MG PO TABS: 40.0000 mg | ORAL_TABLET | Freq: Every evening | ORAL | 0 refills | Status: DC

## 2023-11-06 MED ORDER — GABAPENTIN 300 MG PO CAPS: 300.0000 mg | ORAL_CAPSULE | Freq: Two times a day (BID) | ORAL | 0 refills | Status: DC

## 2023-11-06 NOTE — Progress Notes (Signed)
 Subjective:  Patient ID: Julia [name], female    DOB: Mar 06, 1961, 63 y.o.   MRN: [id number]  Patient Care Team: Severa Rock CHRISTELLA, FNP as PCP - General (Family Medicine) Merrily Brooklyn (Psychiatry) Powers, Marsa POUR, MD as Referring Physician (Neurosurgery) Mai Lynwood FALCON, MD as Consulting Physician (Rheumatology) Nahser, Aleene PARAS, MD as Consulting Physician (Cardiology) Kassie Acquanetta Bradley, MD as Consulting Physician (Pulmonary Disease) Ladora Ross Lacy Phebe, MD as Referring Physician (Optometry)   Chief Complaint:  Medication Refill   HPI: Julia [name] is a 63 y.o. female presenting on 11/06/2023 for Medication Refill   Discussed the use of AI scribe software for clinical note transcription with the patient, who gave verbal consent to proceed.  History of Present Illness   Julia [name] is a 63 year old female who presents for management of chronic medical conditions and follow-up after knee surgery.  She underwent knee surgery recently and has experienced a prolonged recovery period due to an adverse reaction to Dilaudid , which required a counteractive medication. She was discharged late in the evening after the surgery. She has been attending physical therapy and has completed six out of eight prescribed sessions. Soreness and swelling are present in the knee, particularly around the surgical site and calf, making physical therapy challenging. She uses an ice pack three times a day and elevates her leg frequently to manage swelling. Significant bruising is noted, which she attributes to taking aspirin  twice daily, now reduced to once daily.  She continues to take gabapentin  300 mg twice daily and atorvastatin  for cholesterol management, which was reported as 'really good' at the last check. She is also on meloxicam , pending renal function results. She has a history of low B12 levels, which are being monitored.  Increased anxiety and depressive symptoms are present,  particularly after not hearing from her son post-surgery, which resolved after he contacted her.  Her son, who lives in [place] , had COVID along with her grandson. She maintains regular contact with her family, including her granddaughter and younger grandchild, who visit weekly.          Relevant past medical, surgical, family, and social history reviewed and updated as indicated.  Allergies and medications reviewed and updated. Data reviewed: Chart in Epic.   Past Medical History:  Diagnosis Date   Anxiety    Arthritis    Complication of anesthesia    slow to wake up-does not take much medicine to sedate her   Depression    Esophageal reflux    Hyperlipidemia    Low ferritin 02/10/2021   Paget disease of bone    Pneumonia due to COVID-19 virus 05/20/2020   Vitamin B12 deficiency 02/10/2021   Vitamin D  insufficiency 02/10/2021   Wears glasses     Past Surgical History:  Procedure Laterality Date   ANTERIOR FUSION CERVICAL SPINE  10/2019   C3-C5   BREAST CYST EXCISION Left    CERVICAL SPINE SURGERY  10/2011   CESAREAN SECTION     DILATION AND CURETTAGE OF UTERUS  1986   KNEE ARTHROSCOPY Left 07/22/2013   Procedure: LEFT KNEE ARTHROSCOPY WITH DEBRIDEMENT CHONDROPLASTY, REMOVAL LOOSE BODIES, PARTIAL MEDIAL MENISCECTOMY, OPEN EXCISION OF PES GANGLION;  Surgeon: Dempsey PARAS Sensor, MD;  Location: [facility];  Service: Orthopedics;  Laterality: Left;  NO SPECIMEN SENT PER DR. SENSOR ORDER.   KNEE SURGERY Left 11/21/2021   REPLACEMENT TOTAL KNEE Left 10/15/2023   SPINE SURGERY  10/26/2019   TOTAL  ABDOMINAL HYSTERECTOMY  05/2010   TAH   TUBAL LIGATION  1989    Social History   Socioeconomic History   Marital status: Significant Other    Spouse name: Evalene Finder   Number of children: 3   Years of education: Not on file   Highest education level: 12th grade  Occupational History   Occupation: disability  Tobacco Use   Smoking status: Former    Current  packs/day: 0.00    Average packs/day: 0.2 packs/day for 5.0 years (0.8 ttl pk-yrs)    Types: Cigarettes    Start date: 01/03/1980    Quit date: 01/02/1985    Years since quitting: 38.8   Smokeless tobacco: Never   Tobacco comments:    1 pack per week per pt.   Vaping Use   Vaping status: Never Used  Substance and Sexual Activity   Alcohol use: Not Currently    Alcohol/week: 1.0 standard drink of alcohol    Types: 1 Glasses of wine per week    Comment: occassional   Drug use: No   Sexual activity: Not Currently    Birth control/protection: Abstinence  Other Topics Concern   Not on file  Social History Narrative   2 sons live nearby, one son in [place]     05/09/2022 - Has 3 grandchildren - one almost a year old   Social Drivers of Corporate Investment Banker Strain: Low Risk  (09/12/2023)   Overall Financial Resource Strain (CARDIA)    Difficulty of Paying Living Expenses: Not very hard  Food Insecurity: No Food Insecurity (09/12/2023)   Hunger Vital Sign    Worried About Running Out of Food in the Last Year: Never true    Ran Out of Food in the Last Year: Never true  Transportation Needs: No Transportation Needs (09/12/2023)   PRAPARE - Administrator, Civil Service (Medical): No    Lack of Transportation (Non-Medical): No  Physical Activity: Inactive (09/12/2023)   Exercise Vital Sign    Days of Exercise per Week: 0 days    Minutes of Exercise per Session: 0 min  Stress: Stress Concern Present (09/12/2023)   [employer] of Occupational Health - Occupational Stress Questionnaire    Feeling of Stress : Very much  Social Connections: Socially Isolated (09/12/2023)   Social Connection and Isolation Panel [NHANES]    Frequency of Communication with Friends and Family: Once a week    Frequency of Social Gatherings with Friends and Family: Once a week    Attends Religious Services: Never    Database Administrator or Organizations: No    Attends Tax Inspector Meetings: Never    Marital Status: Divorced  Catering Manager Violence: Not At Risk (05/13/2023)   Humiliation, Afraid, Rape, and Kick questionnaire    Fear of Current or Ex-Partner: No    Emotionally Abused: No    Physically Abused: No    Sexually Abused: No    Outpatient Encounter Medications as of 11/06/2023  Medication Sig   aspirin  81 MG chewable tablet 81 mg.   buPROPion  (WELLBUTRIN  XL) 300 MG 24 hr tablet Take 1 tablet (300 mg total) by mouth daily.   Cholecalciferol (VITAMIN D3) 50 MCG (2000 UT) capsule Take 2,000 Units by mouth daily.   DULoxetine  (CYMBALTA ) 60 MG capsule Take 1 capsule (60 mg total) by mouth 2 (two) times daily.   meloxicam  (MOBIC ) 15 MG tablet Take 1 tablet (15 mg total) by mouth daily. **NEEDS TO BE  SEEN BEFORE NEXT REFILL**   traZODone  (DESYREL ) 50 MG tablet Take 0.5 tablets (25 mg total) by mouth at bedtime.   [DISCONTINUED] atorvastatin  (LIPITOR) 40 MG tablet Take 1 tablet (40 mg total) by mouth every evening. **NEEDS TO BE SEEN BEFORE NEXT REFILL**   [DISCONTINUED] gabapentin  (NEURONTIN ) 300 MG capsule Take 1 capsule (300 mg total) by mouth 2 (two) times daily. **NEEDS TO BE SEEN BEFORE NEXT REFILL**   atorvastatin  (LIPITOR) 40 MG tablet Take 1 tablet (40 mg total) by mouth every evening.   gabapentin  (NEURONTIN ) 300 MG capsule Take 1 capsule (300 mg total) by mouth 2 (two) times daily.   [DISCONTINUED] methocarbamol  (ROBAXIN ) 500 MG tablet Take 1 tablet (500 mg total) by mouth every 8 (eight) hours as needed for muscle spasms. (Patient not taking: Reported on 11/06/2023)   [DISCONTINUED] Ondansetron  HCl (ZOFRAN  PO) Take by mouth. (Patient not taking: Reported on 11/06/2023)   No facility-administered encounter medications on file as of 11/06/2023.    Allergies  Allergen Reactions   Lamotrigine Itching and Swelling    Pertinent ROS per HPI, otherwise unremarkable      Objective:  BP 102/73   Pulse 81   Temp 97.9 F (36.6 C) (Temporal)    Ht 5' 1 (1.549 m)   Wt 162 lb (73.5 kg)   LMP 01/02/2010   SpO2 99%   BMI 30.61 kg/m    Wt Readings from Last 3 Encounters:  11/06/23 162 lb (73.5 kg)  09/12/23 161 lb 9.6 oz (73.3 kg)  08/07/23 158 lb 9.6 oz (71.9 kg)    Physical Exam Vitals and nursing note reviewed.  Constitutional:      General: She is not in acute distress.    Appearance: Normal appearance. She is well-developed and well-groomed. She is obese. She is not ill-appearing, toxic-appearing or diaphoretic.  HENT:     Head: Normocephalic and atraumatic.     Jaw: There is normal jaw occlusion.     Right Ear: Hearing normal.     Left Ear: Hearing normal.     Nose: Nose normal.     Mouth/Throat:     Lips: Pink.     Mouth: Mucous membranes are moist.     Pharynx: Oropharynx is clear. Uvula midline.  Eyes:     General: Lids are normal.     Extraocular Movements: Extraocular movements intact.     Conjunctiva/sclera: Conjunctivae normal.     Pupils: Pupils are equal, round, and reactive to light.  Neck:     Thyroid : No thyroid  mass, thyromegaly or thyroid  tenderness.     Vascular: No carotid bruit or JVD.     Trachea: Trachea and phonation normal.  Cardiovascular:     Rate and Rhythm: Normal rate and regular rhythm.     Chest Wall: PMI is not displaced.     Pulses: Normal pulses.     Heart sounds: Normal heart sounds. No murmur heard.    No friction rub. No gallop.  Pulmonary:     Effort: Pulmonary effort is normal. No respiratory distress.     Breath sounds: Normal breath sounds. No wheezing.  Abdominal:     General: Bowel sounds are normal. There is no distension or abdominal bruit.     Palpations: Abdomen is soft. There is no hepatomegaly or splenomegaly.     Tenderness: There is no abdominal tenderness. There is no right CVA tenderness or left CVA tenderness.     Hernia: No hernia is present.  Musculoskeletal:  Cervical back: Normal range of motion and neck supple.     Left knee: Swelling present.  Decreased range of motion.     Right lower leg: No edema.     Left lower leg: No edema.       Legs:  Lymphadenopathy:     Cervical: No cervical adenopathy.  Skin:    General: Skin is warm and dry.     Capillary Refill: Capillary refill takes less than 2 seconds.     Coloration: Skin is not cyanotic, jaundiced or pale.     Findings: No rash.  Neurological:     General: No focal deficit present.     Mental Status: She is alert and oriented to person, place, and time.     Sensory: Sensation is intact.     Motor: Motor function is intact.     Coordination: Coordination is intact.     Gait: Gait abnormal (using cane).     Deep Tendon Reflexes: Reflexes are normal and symmetric.  Psychiatric:        Attention and Perception: Attention and perception normal.        Mood and Affect: Mood and affect normal.        Speech: Speech normal.        Behavior: Behavior normal. Behavior is cooperative.        Thought Content: Thought content normal.        Cognition and Memory: Cognition and memory normal.        Judgment: Judgment normal.      Results for orders placed or performed in visit on 09/12/23  Microscopic Examination   Collection Time: 09/12/23  3:52 PM   Urine  Result Value Ref Range   WBC, UA 0-5 0 - 5 /hpf   RBC, Urine 0-2 0 - 2 /hpf   Epithelial Cells (non renal) 0-10 0 - 10 /hpf   Renal Epithel, UA None seen None seen /hpf   Bacteria, UA Few (A) None seen/Few   Yeast, UA None seen None seen  Urinalysis, Routine w reflex microscopic   Collection Time: 09/12/23  3:52 PM  Result Value Ref Range   Specific Gravity, UA 1.020 1.005 - 1.030   pH, UA 7.0 5.0 - 7.5   Color, UA Yellow Yellow   Appearance Ur Clear Clear   Leukocytes,UA Trace (A) Negative   Protein,UA Negative Negative/Trace   Glucose, UA Negative Negative   Ketones, UA Negative Negative   RBC, UA Negative Negative   Bilirubin, UA Negative Negative   Urobilinogen, Ur 0.2 0.2 - 1.0 mg/dL   Nitrite, UA  Negative Negative   Microscopic Examination See below:        Pertinent labs & imaging results that were available during my care of the patient were reviewed by me and considered in my medical decision making.  Assessment & Plan:  Mccall was seen today for medication refill.  Diagnoses and all orders for this visit:  Mixed hyperlipidemia -     atorvastatin  (LIPITOR) 40 MG tablet; Take 1 tablet (40 mg total) by mouth every evening. -     CMP14+EGFR  Neuropathy -     gabapentin  (NEURONTIN ) 300 MG capsule; Take 1 capsule (300 mg total) by mouth 2 (two) times daily. -     Vitamin B12  Arthritis -     gabapentin  (NEURONTIN ) 300 MG capsule; Take 1 capsule (300 mg total) by mouth 2 (two) times daily. -     CMP14+EGFR  Vitamin  D insufficiency -     CMP14+EGFR  Vitamin B12 deficiency -     Vitamin B12  Prediabetes -     CMP14+EGFR  Moderate episode of recurrent major depressive disorder (HCC) -     Vitamin B12  GAD (generalized anxiety disorder) -     Vitamin B12  History of left knee replacement Doing well postoperatively     Assessment and Plan    Postoperative Care for Knee Surgery Recovering from recent knee surgery with significant swelling and soreness in the knee and calf. Nerve block used for four days postoperatively. Currently using ice packs and elevating the leg. Therapy ongoing with eight visits scheduled, six completed. Bruising noted due to aspirin  use. Discussed risks of continued swelling and bruising, and benefits of physical therapy and ice application. Reducing aspirin  to 81 mg once daily should help minimize bruising. Patient prefers to continue therapy and ice application. - Continue physical therapy - Use ice packs three times a day - Elevate leg frequently - Reduce aspirin  to 81 mg once daily - Order B12 and renal function tests - Follow up in August for physical and lab updates  Anxiety and Depression Reports fluctuating symptoms of anxiety and  depression, with recent increase in depressive symptoms due to personal stressors. Currently on duloxetine , which may affect renal function. Discussed potential need to change duloxetine  based on renal function results. Patient prefers to monitor renal function before making any changes. - Check renal function - Consider changing duloxetine  based on renal function results  Chronic Pain Management On gabapentin  300 mg twice daily for chronic pain management. Discussed benefits of continued gabapentin  use for pain control. - Refill gabapentin   Hyperlipidemia Cholesterol levels are well-controlled with atorvastatin . Discussed benefits of continued atorvastatin  use for cholesterol management. - Refill atorvastatin   General Health Maintenance Routine health maintenance discussed. Previous labs including cholesterol, thyroid , vitamin D , and A1c were within normal limits. B12 levels tend to run low. Discussed importance of maintaining these levels for overall health. - Repeat B12 test - Schedule Medicare wellness visit in August  Follow-up - Schedule follow-up visit in August for physical and lab updates.          Continue all other maintenance medications.  Follow up plan: Return if symptoms worsen or fail to improve.   Continue healthy lifestyle choices, including diet (rich in fruits, vegetables, and lean proteins, and low in salt and simple carbohydrates) and exercise (at least 30 minutes of moderate physical activity daily).  Educational handout given for prediabetes  The above assessment and management plan was discussed with the patient. The patient verbalized understanding of and has agreed to the management plan. Patient is aware to call the clinic if they develop any new symptoms or if symptoms persist or worsen. Patient is aware when to return to the clinic for a follow-up visit. Patient educated on when it is appropriate to go to the emergency department.   Rosaline Bruns,  FNP-C [facility] [phone]

## 2023-11-07 ENCOUNTER — Ambulatory Visit: Payer: Medicare HMO

## 2023-11-07 DIAGNOSIS — M25662 Stiffness of left knee, not elsewhere classified: Secondary | ICD-10-CM

## 2023-11-07 DIAGNOSIS — R600 Localized edema: Secondary | ICD-10-CM

## 2023-11-07 DIAGNOSIS — M25562 Pain in left knee: Secondary | ICD-10-CM

## 2023-11-07 LAB — CMP14+EGFR
ALT: 16 [IU]/L (ref 0–32)
AST: 19 [IU]/L (ref 0–40)
Albumin: 4.5 g/dL (ref 3.9–4.9)
Alkaline Phosphatase: 112 [IU]/L (ref 44–121)
BUN/Creatinine Ratio: 14 (ref 12–28)
BUN: 14 mg/dL (ref 8–27)
Bilirubin Total: 0.6 mg/dL (ref 0.0–1.2)
CO2: 19 mmol/L — ABNORMAL LOW (ref 20–29)
Calcium: 10 mg/dL (ref 8.7–10.3)
Chloride: 104 mmol/L (ref 96–106)
Creatinine, Ser: 1.01 mg/dL — ABNORMAL HIGH (ref 0.57–1.00)
Globulin, Total: 2.5 g/dL (ref 1.5–4.5)
Glucose: 99 mg/dL (ref 70–99)
Potassium: 4.9 mmol/L (ref 3.5–5.2)
Sodium: 142 mmol/L (ref 134–144)
Total Protein: 7 g/dL (ref 6.0–8.5)
eGFR: 63 mL/min/{1.73_m2} (ref >59)

## 2023-11-07 LAB — VITAMIN B12: Vitamin B-12: 336 pg/mL (ref 232–1245)

## 2023-11-07 NOTE — Therapy (Signed)
 OUTPATIENT PHYSICAL THERAPY LOWER EXTREMITY   Patient Name: Julia [name] MRN: [id number] DOB:[birth year], 63 y.o., female Today's Date: 11/07/2023  END OF SESSION:  PT End of Session - 11/07/23 1611     Visit Number 7    Number of Visits 8    Date for PT Re-Evaluation 11/15/23    PT Start Time 1600    PT Stop Time 1650    PT Time Calculation (min) 50 min    Activity Tolerance Patient tolerated treatment well    Behavior During Therapy [facility] for tasks assessed/performed               Past Medical History:  Diagnosis Date   Anxiety    Arthritis    Complication of anesthesia    slow to wake up-does not take much medicine to sedate her   Depression    Esophageal reflux    Hyperlipidemia    Low ferritin 02/10/2021   Paget disease of bone    Pneumonia due to COVID-19 virus 05/20/2020   Vitamin B12 deficiency 02/10/2021   Vitamin D  insufficiency 02/10/2021   Wears glasses    Past Surgical History:  Procedure Laterality Date   ANTERIOR FUSION CERVICAL SPINE  10/2019   C3-C5   BREAST CYST EXCISION Left    CERVICAL SPINE SURGERY  10/2011   CESAREAN SECTION     DILATION AND CURETTAGE OF UTERUS  1986   KNEE ARTHROSCOPY Left 07/22/2013   Procedure: LEFT KNEE ARTHROSCOPY WITH DEBRIDEMENT CHONDROPLASTY, REMOVAL LOOSE BODIES, PARTIAL MEDIAL MENISCECTOMY, OPEN EXCISION OF PES GANGLION;  Surgeon: Dempsey JINNY Sensor, MD;  Location: [facility];  Service: Orthopedics;  Laterality: Left;  NO SPECIMEN SENT PER DR. SENSOR ORDER.   KNEE SURGERY Left 11/21/2021   REPLACEMENT TOTAL KNEE Left 10/15/2023   SPINE SURGERY  10/26/2019   TOTAL ABDOMINAL HYSTERECTOMY  05/2010   TAH   TUBAL LIGATION  1989   Patient Active Problem List   Diagnosis Date Noted   History of left knee replacement 11/06/2023   MDD (major depressive disorder), recurrent episode (HCC) 08/02/2022   Osteoarthritis of left knee 06/23/2022   Osteoarthritis of right acromioclavicular joint 12/07/2021    Arthritis 02/12/2021   Prediabetes 02/12/2021   Mixed stress and urge urinary incontinence 02/12/2021   Obesity (BMI 30.0-34.9) 02/12/2021   Low ferritin 02/10/2021   Vitamin D  insufficiency 02/10/2021   Vitamin B12 deficiency 02/10/2021   COVID-19 long hauler 10/13/2020   Neuropathy 08/22/2020   Chronic pain 02/10/2016   Paget disease of bone 12/28/2015   Esophageal reflux 08/15/2015   Mixed hyperlipidemia 08/15/2015   Chronic pain in shoulder 08/15/2015   Anxiety and depression 11/25/2013   GAD (generalized anxiety disorder) 11/25/2013   IBS (irritable bowel syndrome) 01/02/2013    PCP: Severa Rock CHRISTELLA, FNP  REFERRING PROVIDER: Melodi Dempsey, MD   REFERRING DIAG: Osteoarthritis of left knee joint  THERAPY DIAG:  Acute pain of left knee  Stiffness of left knee, not elsewhere classified  Localized edema  Rationale for Evaluation and Treatment: Rehabilitation  ONSET DATE: 10/15/23  SUBJECTIVE:   SUBJECTIVE STATEMENT: Patient reports that she is hurting today, but she has been walking yesterday and today.  PERTINENT HISTORY: Neuropathy, Paget disease, arthritis, anxiety, depression, chronic pain, and history of COVID-19 PAIN:  Are you having pain? Yes: NPRS scale: 6/10 Pain location: left thigh and knee Pain description: constant aching and throbbing with intermittent soreness Aggravating factors: getting into and out of bed Relieving factors: laying  down, ice, and medication  PRECAUTIONS: Fall (due to poor vision)   RED FLAGS: None   WEIGHT BEARING RESTRICTIONS: No  FALLS:  Has patient fallen in last 6 months? No  LIVING ENVIRONMENT: Lives with: lives with an adult companion Lives in: House/apartment Stairs: Yes: External: 3 steps; none Has following equipment at home: Walker - 2 wheeled  OCCUPATION: disabled  PLOF: Independent  PATIENT GOALS: reduced pain, improved, be able to watch her granddaughter, and improved sleep  NEXT MD VISIT:  10/29/23  OBJECTIVE:  Note: Objective measures were completed at Evaluation unless otherwise noted.  PATIENT SURVEYS:  FOTO 21.03  COGNITION: Overall cognitive status: Within functional limits for tasks assessed     SENSATION: Patient reports left lateral knee numbness since surgery.   EDEMA:  Circumferential: L tibiofemoral joint line: 46.5 cm R tibiofemoral joint line: 39.0 cm  PALPATION: TTP: right quadriceps, IT band, and lateral knee  LOWER EXTREMITY ROM:  Active ROM Right eval Left eval Left 10/28/23  Hip flexion     Hip extension     Hip abduction     Hip adduction     Hip internal rotation     Hip external rotation     Knee flexion 140 64/ 38 (PROM)  100  Knee extension 0 21   Ankle dorsiflexion     Ankle plantarflexion     Ankle inversion     Ankle eversion      (Blank rows = not tested)  LOWER EXTREMITY MMT: not tested due to surgical condition  LOWER EXTREMITY SPECIAL TESTS:  Not tested due to surgical condition  GAIT: Assistive device utilized: Environmental Consultant - 2 wheeled Level of assistance: Modified independence Comments: decreased gait speed and stride length with left knee flexed in stance phase                                                                                                                                TREATMENT DATE:                                     11/07/23 EXERCISE LOG  Exercise Repetitions and Resistance Comments  Nustep  L4 x 5 minutes; seat 7-5   Recumbent bike  L2 x 15 minutes; 6   Standing gastroc stretch  3 minutes   LAQ 2# x 3 minutes LLE only  Standing HS stretch  4 x 15 seconds   Lunges onto step  6 step x 15 reps     Blank cell = exercise not performed today  Modalities: no adverse reaction to today's modalities  Date:  Vaso: Knee, 34 degrees; low pressure, 15 mins, Pain and Edema  11/05/23 EXERCISE LOG  Exercise Repetitions and Resistance Comments  Nustep L4 x 8 minutes; seat  6   Recumbent bike  L1 x 11 minutes; seat 6   Rocker board  4 minutes    Lunges onto step  14 step x 4 minutes For L knee flexion  LAQ 2 minutes  LLE only   Step up  6 step x 20 reps 8step x 20 reps LLE leading   Blank cell = exercise not performed today  Modalities: no adverse reaction to today's modalities  Date:  Vaso: Knee, 34 degrees; low pressure, 15 mins, Pain and Tone  10/31/23:   EXERCISE LOG  Exercise Repetitions and Resistance Comments  Nustep Level 4 starting at seat 7 and ending at seat 5 over 15 minutes.   Recumbent bike 5 minutes   Rockerboard 3 minutes   SAQ's 3# x 4 minutes       f/b LE elevation and vasopneumatic on low x 15 minutes.   PATIENT EDUCATION:  Education details: healing and goals for physical therapy Person educated: Patient Education method: Explanation Education comprehension: verbalized understanding  HOME EXERCISE PROGRAM:   ASSESSMENT:  CLINICAL IMPRESSION: Patient was progressed with a stand hamstring stretch and familiar interventions for improved left knee extension needed for improved gait mechanics. She required minimal cueing with the standing hamstring stretch for proper positioning to facilitate improved soft tissue extensibility. She experienced a mild increase in left knee discomfort with this intervention, but it did not limit her ability to complete any of today's interventions. She reported that her knee felt alright upon the conclusion of treatment. She continues to require skilled physical therapy to address her remaining impairments to return to her prior level of function.   OBJECTIVE IMPAIRMENTS: Abnormal gait, decreased activity tolerance, decreased balance, decreased mobility, difficulty walking, decreased ROM, decreased strength, hypomobility, increased edema, impaired flexibility, impaired sensation, impaired tone, and pain.   ACTIVITY LIMITATIONS: carrying, lifting, standing, squatting, sleeping, stairs, transfers,  dressing, locomotion level, and caring for others  PARTICIPATION LIMITATIONS: meal prep, cleaning, laundry, driving, shopping, and community activity  PERSONAL FACTORS: Past/current experiences, Transportation, and 3+ comorbidities: Neuropathy, Paget disease, arthritis, anxiety, depression, chronic pain, and history of COVID-19  are also affecting patient's functional outcome.   REHAB POTENTIAL: Good  CLINICAL DECISION MAKING: Evolving/moderate complexity  EVALUATION COMPLEXITY: Moderate   GOALS: Goals reviewed with patient? Yes  LONG TERM GOALS: Target date: 11/15/23  Patient will be independent with her HEP.  Baseline:  Goal status: INITIAL  2.  Patient will improve her active left knee extension within 5 degrees of neutral for improved gait mechanics.  Baseline:  Goal status: INITIAL  3.  Patient will improve her right knee flexion to at least 120 degrees for improved function navigating stairs.  Baseline:  Goal status: INITIAL  4.  Patient will be able to safely navigate with a cane or the least restrictive assistive device for improved household mobility.  Baseline:  Goal status: INITIAL  5.  Patient will be able to navigate at least 3 steps with a reciprocal pattern for improved household mobility.  Baseline:  Goal status: INITIAL  PLAN:  PT FREQUENCY: 2-3x/week  PT DURATION: 4 weeks  PLANNED INTERVENTIONS: 97164- PT Re-evaluation, 97110-Therapeutic exercises, 97530- Therapeutic activity, 97112- Neuromuscular re-education, 97535- Self Care, [id number]- Manual therapy, 97116- Gait training, 97014- Electrical stimulation (unattended), 97016- Vasopneumatic device, Patient/Family education, Balance training, Stair training, Joint mobilization, Cryotherapy, and Moist heat  PLAN FOR NEXT SESSION: Nustep, quad sets, heel  slides, PROM, and modalities as needed   Lacinda JAYSON Fass, PT 11/07/2023, 6:12 PM

## 2023-11-12 ENCOUNTER — Ambulatory Visit: Payer: Medicare HMO | Admitting: Physical Therapy

## 2023-11-12 DIAGNOSIS — M25562 Pain in left knee: Secondary | ICD-10-CM

## 2023-11-12 DIAGNOSIS — R600 Localized edema: Secondary | ICD-10-CM | POA: Diagnosis not present

## 2023-11-12 DIAGNOSIS — M25662 Stiffness of left knee, not elsewhere classified: Secondary | ICD-10-CM

## 2023-11-12 NOTE — Therapy (Signed)
OUTPATIENT PHYSICAL THERAPY LOWER EXTREMITY   Patient Name: Julia [name] MRN: [id number] DOB:1961/06/21, 63 y.o., female Today's Date: 11/12/2023  END OF SESSION:  PT End of Session - 11/12/23 1553     Visit Number 8    Number of Visits 16    Date for PT Re-Evaluation 12/13/23    PT Start Time 0315    PT Stop Time 0406    PT Time Calculation (min) 51 min    Activity Tolerance Patient tolerated treatment well    Behavior During Therapy [facility] for tasks assessed/performed               Past Medical History:  Diagnosis Date   Anxiety    Arthritis    Complication of anesthesia    slow to wake up-does not take much medicine to sedate her   Depression    Esophageal reflux    Hyperlipidemia    Low ferritin 02/10/2021   Paget disease of bone    Pneumonia due to COVID-19 virus 05/20/2020   Vitamin B12 deficiency 02/10/2021   Vitamin D insufficiency 02/10/2021   Wears glasses    Past Surgical History:  Procedure Laterality Date   ANTERIOR FUSION CERVICAL SPINE  10/2019   C3-C5   BREAST CYST EXCISION Left    CERVICAL SPINE SURGERY  10/2011   CESAREAN SECTION     DILATION AND CURETTAGE OF UTERUS  1986   KNEE ARTHROSCOPY Left 07/22/2013   Procedure: LEFT KNEE ARTHROSCOPY WITH DEBRIDEMENT CHONDROPLASTY, REMOVAL LOOSE BODIES, PARTIAL MEDIAL MENISCECTOMY, OPEN EXCISION OF PES GANGLION;  Surgeon: [name], MD;  Location: [facility];  Service: Orthopedics;  Laterality: Left;  NO SPECIMEN SENT PER DR. [name] ORDER.   KNEE SURGERY Left 11/21/2021   REPLACEMENT TOTAL KNEE Left 10/15/2023   SPINE SURGERY  10/26/2019   TOTAL ABDOMINAL HYSTERECTOMY  05/2010   TAH   TUBAL LIGATION  1989   Patient Active Problem List   Diagnosis Date Noted   History of left knee replacement 11/06/2023   MDD (major depressive disorder), recurrent episode (HCC) 08/02/2022   Osteoarthritis of left knee 06/23/2022   Osteoarthritis of right acromioclavicular joint 12/07/2021    Arthritis 02/12/2021   Prediabetes 02/12/2021   Mixed stress and urge urinary incontinence 02/12/2021   Obesity (BMI 30.0-34.9) 02/12/2021   Low ferritin 02/10/2021   Vitamin D insufficiency 02/10/2021   Vitamin B12 deficiency 02/10/2021   COVID-19 long hauler 10/13/2020   Neuropathy 08/22/2020   Chronic pain 02/10/2016   Paget disease of bone 12/28/2015   Esophageal reflux 08/15/2015   Mixed hyperlipidemia 08/15/2015   Chronic pain in shoulder 08/15/2015   Anxiety and depression 11/25/2013   GAD (generalized anxiety disorder) 11/25/2013   IBS (irritable bowel syndrome) 01/02/2013    PCP: Sonny Masters, FNP  REFERRING PROVIDER: Ollen Gross, MD   REFERRING DIAG: Osteoarthritis of left knee joint  THERAPY DIAG:  Acute pain of left knee  Stiffness of left knee, not elsewhere classified  Localized edema  Rationale for Evaluation and Treatment: Rehabilitation  ONSET DATE: 10/15/23  SUBJECTIVE:   SUBJECTIVE STATEMENT: Knee bothering her today.    PERTINENT HISTORY: Neuropathy, Paget disease, arthritis, anxiety, depression, chronic pain, and history of COVID-19 PAIN:  Are you having pain? Yes: NPRS scale: 6/10 Pain location: left thigh and knee Pain description: constant aching and throbbing with intermittent soreness Aggravating factors: getting into and out of bed Relieving factors: laying down, ice, and medication  PRECAUTIONS: Fall (due to  poor vision)   RED FLAGS: None   WEIGHT BEARING RESTRICTIONS: No  FALLS:  Has patient fallen in last 6 months? No  LIVING ENVIRONMENT: Lives with: lives with an adult companion Lives in: House/apartment Stairs: Yes: External: 3 steps; none Has following equipment at home: Walker - 2 wheeled  OCCUPATION: disabled  PLOF: Independent  PATIENT GOALS: reduced pain, improved, be able to watch her granddaughter, and improved sleep  NEXT MD VISIT: 10/29/23  OBJECTIVE:  Note: Objective measures were completed at  Evaluation unless otherwise noted.  PATIENT SURVEYS:  FOTO 21.03  COGNITION: Overall cognitive status: Within functional limits for tasks assessed     SENSATION: Patient reports left lateral knee numbness since surgery.   EDEMA:  Circumferential: L tibiofemoral joint line: 46.5 cm R tibiofemoral joint line: 39.0 cm  PALPATION: TTP: right quadriceps, IT band, and lateral knee  LOWER EXTREMITY ROM:  Active ROM Right eval Left eval Left 10/28/23  Hip flexion     Hip extension     Hip abduction     Hip adduction     Hip internal rotation     Hip external rotation     Knee flexion 140 64/ 38 (PROM)  100  Knee extension 0 21   Ankle dorsiflexion     Ankle plantarflexion     Ankle inversion     Ankle eversion      (Blank rows = not tested)  LOWER EXTREMITY MMT: not tested due to surgical condition  LOWER EXTREMITY SPECIAL TESTS:  Not tested due to surgical condition  GAIT: Assistive device utilized: Environmental consultant - 2 wheeled Level of assistance: Modified independence Comments: decreased gait speed and stride length with left knee flexed in stance phase                                                                                                                                TREATMENT DATE:                                       EXERCISE LOG  Exercise Repetitions and Resistance Comments  Nustep Level 3 x 15 minutes   Rockerboard In parallel bars x 3 minutes               In supine:  Gentle LLLDS x 5 minutes into left knee flexion and extension f/b  LE elevation and vasopneumatic on low with low-level IFC at 80-150 Hz x 15 minutes to patient's left knee.  Normal modality response following removal of modality.                                     11/07/23 EXERCISE LOG  Exercise Repetitions and Resistance Comments  Nustep  L4 x 5 minutes; seat 7-5  Recumbent bike  L2 x 15 minutes; 6   Standing gastroc stretch  3 minutes   LAQ 2# x 3 minutes LLE only  Standing HS  stretch  4 x 15 seconds   Lunges onto step  6" step x 15 reps     Blank cell = exercise not performed today  Modalities: no adverse reaction to today's modalities  Date:  Vaso: Knee, 34 degrees; low pressure, 15 mins, Pain and Edema          PATIENT EDUCATION:  Education details: healing and goals for physical therapy Person educated: Patient Education method: Explanation Education comprehension: verbalized understanding  HOME EXERCISE PROGRAM:   ASSESSMENT:  CLINICAL IMPRESSION: Patient reporting her knee has been bothering her more over the last couple of days.  She is very motivated and stays quite active at home.  No recumbent bike due to increased pain today.  Added IFC to help decrease pain which was tolerated without complaint.  Patient was tender to palpation over her left knee adductor tubercle region.    OBJECTIVE IMPAIRMENTS: Abnormal gait, decreased activity tolerance, decreased balance, decreased mobility, difficulty walking, decreased ROM, decreased strength, hypomobility, increased edema, impaired flexibility, impaired sensation, impaired tone, and pain.   ACTIVITY LIMITATIONS: carrying, lifting, standing, squatting, sleeping, stairs, transfers, dressing, locomotion level, and caring for others  PARTICIPATION LIMITATIONS: meal prep, cleaning, laundry, driving, shopping, and community activity  PERSONAL FACTORS: Past/current experiences, Transportation, and 3+ comorbidities: Neuropathy, Paget disease, arthritis, anxiety, depression, chronic pain, and history of COVID-19  are also affecting patient's functional outcome.   REHAB POTENTIAL: Good  CLINICAL DECISION MAKING: Evolving/moderate complexity  EVALUATION COMPLEXITY: Moderate   GOALS: Goals reviewed with patient? Yes  LONG TERM GOALS: Target date: 11/15/23  Patient will be independent with her HEP.  Baseline:  Goal status: INITIAL  2.  Patient will improve her active left knee extension within 5  degrees of neutral for improved gait mechanics.  Baseline:  Goal status: INITIAL  3.  Patient will improve her right knee flexion to at least 120 degrees for improved function navigating stairs.  Baseline:  Goal status: INITIAL  4.  Patient will be able to safely navigate with a cane or the least restrictive assistive device for improved household mobility.  Baseline:  Goal status: INITIAL  5.  Patient will be able to navigate at least 3 steps with a reciprocal pattern for improved household mobility.  Baseline:  Goal status: INITIAL  PLAN:  PT FREQUENCY: 2-3x/week  PT DURATION: 4 weeks  PLANNED INTERVENTIONS: 97164- PT Re-evaluation, 97110-Therapeutic exercises, 97530- Therapeutic activity, 97112- Neuromuscular re-education, 97535- Self Care, 96045- Manual therapy, 97116- Gait training, 97014- Electrical stimulation (unattended), 97016- Vasopneumatic device, Patient/Family education, Balance training, Stair training, Joint mobilization, Cryotherapy, and Moist heat  PLAN FOR NEXT SESSION: Nustep, quad sets, heel slides, PROM, and modalities as needed   Donnelle Rubey, [place], PT 11/12/2023, 4:17 PM

## 2023-11-14 ENCOUNTER — Ambulatory Visit: Payer: Medicare HMO

## 2023-11-14 DIAGNOSIS — R600 Localized edema: Secondary | ICD-10-CM | POA: Diagnosis not present

## 2023-11-14 DIAGNOSIS — M25562 Pain in left knee: Secondary | ICD-10-CM | POA: Diagnosis not present

## 2023-11-14 DIAGNOSIS — M25662 Stiffness of left knee, not elsewhere classified: Secondary | ICD-10-CM | POA: Diagnosis not present

## 2023-11-14 NOTE — Therapy (Signed)
OUTPATIENT PHYSICAL THERAPY LOWER EXTREMITY   Patient Name: Julia [name] MRN: [id number] DOB:11-Aug-1961, 63 y.o., female Today's Date: 11/14/2023  END OF SESSION:  PT End of Session - 11/14/23 1602     Visit Number 9    Number of Visits 16    Date for PT Re-Evaluation 12/13/23    PT Start Time 1600    PT Stop Time 1653    PT Time Calculation (min) 53 min    Activity Tolerance Patient tolerated treatment well    Behavior During Therapy [facility] for tasks assessed/performed                Past Medical History:  Diagnosis Date   Anxiety    Arthritis    Complication of anesthesia    slow to wake up-does not take much medicine to sedate her   Depression    Esophageal reflux    Hyperlipidemia    Low ferritin 02/10/2021   Paget disease of bone    Pneumonia due to COVID-19 virus 05/20/2020   Vitamin B12 deficiency 02/10/2021   Vitamin D insufficiency 02/10/2021   Wears glasses    Past Surgical History:  Procedure Laterality Date   ANTERIOR FUSION CERVICAL SPINE  10/2019   C3-C5   BREAST CYST EXCISION Left    CERVICAL SPINE SURGERY  10/2011   CESAREAN SECTION     DILATION AND CURETTAGE OF UTERUS  1986   KNEE ARTHROSCOPY Left 07/22/2013   Procedure: LEFT KNEE ARTHROSCOPY WITH DEBRIDEMENT CHONDROPLASTY, REMOVAL LOOSE BODIES, PARTIAL MEDIAL MENISCECTOMY, OPEN EXCISION OF PES GANGLION;  Surgeon: [name], MD;  Location: [facility];  Service: Orthopedics;  Laterality: Left;  NO SPECIMEN SENT PER DR. [name] ORDER.   KNEE SURGERY Left 11/21/2021   REPLACEMENT TOTAL KNEE Left 10/15/2023   SPINE SURGERY  10/26/2019   TOTAL ABDOMINAL HYSTERECTOMY  05/2010   TAH   TUBAL LIGATION  1989   Patient Active Problem List   Diagnosis Date Noted   History of left knee replacement 11/06/2023   MDD (major depressive disorder), recurrent episode (HCC) 08/02/2022   Osteoarthritis of left knee 06/23/2022   Osteoarthritis of right acromioclavicular joint 12/07/2021    Arthritis 02/12/2021   Prediabetes 02/12/2021   Mixed stress and urge urinary incontinence 02/12/2021   Obesity (BMI 30.0-34.9) 02/12/2021   Low ferritin 02/10/2021   Vitamin D insufficiency 02/10/2021   Vitamin B12 deficiency 02/10/2021   COVID-19 long hauler 10/13/2020   Neuropathy 08/22/2020   Chronic pain 02/10/2016   Paget disease of bone 12/28/2015   Esophageal reflux 08/15/2015   Mixed hyperlipidemia 08/15/2015   Chronic pain in shoulder 08/15/2015   Anxiety and depression 11/25/2013   GAD (generalized anxiety disorder) 11/25/2013   IBS (irritable bowel syndrome) 01/02/2013    PCP: Sonny Masters, FNP  REFERRING PROVIDER: Ollen Gross, MD   REFERRING DIAG: Osteoarthritis of left knee joint  THERAPY DIAG:  No diagnosis found.  Rationale for Evaluation and Treatment: Rehabilitation  ONSET DATE: 10/15/23  SUBJECTIVE:   SUBJECTIVE STATEMENT: Patient reports that she is hurting today, but not as bad as she was at her last appointment.   PERTINENT HISTORY: Neuropathy, Paget disease, arthritis, anxiety, depression, chronic pain, and history of COVID-19 PAIN:  Are you having pain? Yes: NPRS scale: 5/10 Pain location: left thigh and knee Pain description: constant aching and throbbing with intermittent soreness Aggravating factors: getting into and out of bed Relieving factors: laying down, ice, and medication  PRECAUTIONS: Fall (due  to poor vision)   RED FLAGS: None   WEIGHT BEARING RESTRICTIONS: No  FALLS:  Has patient fallen in last 6 months? No  LIVING ENVIRONMENT: Lives with: lives with an adult companion Lives in: House/apartment Stairs: Yes: External: 3 steps; none Has following equipment at home: Walker - 2 wheeled  OCCUPATION: disabled  PLOF: Independent  PATIENT GOALS: reduced pain, improved, be able to watch her granddaughter, and improved sleep  NEXT MD VISIT: 10/29/23  OBJECTIVE:  Note: Objective measures were completed at Evaluation  unless otherwise noted.  PATIENT SURVEYS:  FOTO 21.03  COGNITION: Overall cognitive status: Within functional limits for tasks assessed     SENSATION: Patient reports left lateral knee numbness since surgery.   EDEMA:  Circumferential: L tibiofemoral joint line: 46.5 cm R tibiofemoral joint line: 39.0 cm  PALPATION: TTP: right quadriceps, IT band, and lateral knee  LOWER EXTREMITY ROM:  Active ROM Right eval Left eval Left 10/28/23  Hip flexion     Hip extension     Hip abduction     Hip adduction     Hip internal rotation     Hip external rotation     Knee flexion 140 64/ 38 (PROM)  100  Knee extension 0 21   Ankle dorsiflexion     Ankle plantarflexion     Ankle inversion     Ankle eversion      (Blank rows = not tested)  LOWER EXTREMITY MMT: not tested due to surgical condition  LOWER EXTREMITY SPECIAL TESTS:  Not tested due to surgical condition  GAIT: Assistive device utilized: Environmental consultant - 2 wheeled Level of assistance: Modified independence Comments: decreased gait speed and stride length with left knee flexed in stance phase                                                                                                                                TREATMENT DATE:                                    11/14/23  EXERCISE LOG  Exercise Repetitions and Resistance Comments  Nustep  L4 x 15 minutes; seat 6-5   Rocker board  3.5 minutes   Lunges onto step  14" step x 2 minutes  LLE on step for knee flexion  Marching on foam  2.5 minutes   Semi-tandem on foam  2 x 1 minutes each         Blank cell = exercise not performed today  Modalities: no redness or adverse reaction to today's modalities  Date:  Unattended Estim: left hip adductors and iliotibial band, IFC @ 80-150 Hz w/ 40% scan , 15 mins, Pain and Edema Vaso: Knee, 34 degrees; low pressure, 15 mins, Pain and Edema  11/12/23 EXERCISE LOG  Exercise Repetitions and  Resistance Comments  Nustep Level 3 x 15 minutes   Rockerboard In parallel bars x 3 minutes               In supine:  Gentle LLLDS x 5 minutes into left knee flexion and extension f/b  LE elevation and vasopneumatic on low with low-level IFC at 80-150 Hz x 15 minutes to patient's left knee.  Normal modality response following removal of modality.                                     11/07/23 EXERCISE LOG  Exercise Repetitions and Resistance Comments  Nustep  L4 x 5 minutes; seat 7-5   Recumbent bike  L2 x 15 minutes; 6   Standing gastroc stretch  3 minutes   LAQ 2# x 3 minutes LLE only  Standing HS stretch  4 x 15 seconds   Lunges onto step  6" step x 15 reps     Blank cell = exercise not performed today  Modalities: no adverse reaction to today's modalities  Date:  Vaso: Knee, 34 degrees; low pressure, 15 mins, Pain and Edema          PATIENT EDUCATION:  Education details: healing and goals for physical therapy Person educated: Patient Education method: Explanation Education comprehension: verbalized understanding  HOME EXERCISE PROGRAM:   ASSESSMENT:  CLINICAL IMPRESSION: Patient was introduced to new interventions for improved static and dynamic stability. She required minimal cueing with marching on foam for proper exercise performance to promote increased demand on her left lower extremity. Tandem stance on foam was the most difficult of today's interventions. She reported feeling "ok"  upon the conclusion of treatment. She continues to require skilled physical therapy to address her remaining impairments to return to her prior level of function.   OBJECTIVE IMPAIRMENTS: Abnormal gait, decreased activity tolerance, decreased balance, decreased mobility, difficulty walking, decreased ROM, decreased strength, hypomobility, increased edema, impaired flexibility, impaired sensation, impaired tone, and pain.   ACTIVITY LIMITATIONS: carrying, lifting, standing, squatting,  sleeping, stairs, transfers, dressing, locomotion level, and caring for others  PARTICIPATION LIMITATIONS: meal prep, cleaning, laundry, driving, shopping, and community activity  PERSONAL FACTORS: Past/current experiences, Transportation, and 3+ comorbidities: Neuropathy, Paget disease, arthritis, anxiety, depression, chronic pain, and history of COVID-19  are also affecting patient's functional outcome.   REHAB POTENTIAL: Good  CLINICAL DECISION MAKING: Evolving/moderate complexity  EVALUATION COMPLEXITY: Moderate   GOALS: Goals reviewed with patient? Yes  LONG TERM GOALS: Target date: 11/15/23  Patient will be independent with her HEP.  Baseline:  Goal status: INITIAL  2.  Patient will improve her active left knee extension within 5 degrees of neutral for improved gait mechanics.  Baseline:  Goal status: INITIAL  3.  Patient will improve her right knee flexion to at least 120 degrees for improved function navigating stairs.  Baseline:  Goal status: INITIAL  4.  Patient will be able to safely navigate with a cane or the least restrictive assistive device for improved household mobility.  Baseline:  Goal status: INITIAL  5.  Patient will be able to navigate at least 3 steps with a reciprocal pattern for improved household mobility.  Baseline:  Goal status: INITIAL  PLAN:  PT FREQUENCY: 2-3x/week  PT DURATION: 4 weeks  PLANNED INTERVENTIONS: 97164- PT Re-evaluation, 97110-Therapeutic exercises, 97530- Therapeutic activity, [id number]- Neuromuscular re-education, 97535- Self Care,  28413- Manual therapy, [id number]- Gait training, 24401- Electrical stimulation (unattended), [phone]- Vasopneumatic device, Patient/Family education, Balance training, Stair training, Joint mobilization, Cryotherapy, and Moist heat  PLAN FOR NEXT SESSION: Nustep, quad sets, heel slides, PROM, and modalities as needed   Granville Lewis, PT 11/14/2023, 4:54 PM

## 2023-11-18 ENCOUNTER — Ambulatory Visit: Payer: Medicare HMO | Admitting: Physical Therapy

## 2023-11-18 DIAGNOSIS — R600 Localized edema: Secondary | ICD-10-CM

## 2023-11-18 DIAGNOSIS — M25662 Stiffness of left knee, not elsewhere classified: Secondary | ICD-10-CM | POA: Diagnosis not present

## 2023-11-18 DIAGNOSIS — M25562 Pain in left knee: Secondary | ICD-10-CM | POA: Diagnosis not present

## 2023-11-18 NOTE — Therapy (Addendum)
OUTPATIENT PHYSICAL THERAPY LOWER EXTREMITY   Patient Name: Julia [name] MRN: [id number] DOB:Feb 13, 1961, 63 y.o., female Today's Date: 11/18/2023  END OF SESSION:  PT End of Session - 11/18/23 1521     Visit Number 10    Number of Visits 16    Date for PT Re-Evaluation 12/13/23    PT Start Time 0316    PT Stop Time 0407    PT Time Calculation (min) 51 min    Activity Tolerance Patient tolerated treatment well    Behavior During Therapy [facility] for tasks assessed/performed                Past Medical History:  Diagnosis Date   Anxiety    Arthritis    Complication of anesthesia    slow to wake up-does not take much medicine to sedate her   Depression    Esophageal reflux    Hyperlipidemia    Low ferritin 02/10/2021   Paget disease of bone    Pneumonia due to COVID-19 virus 05/20/2020   Vitamin B12 deficiency 02/10/2021   Vitamin D insufficiency 02/10/2021   Wears glasses    Past Surgical History:  Procedure Laterality Date   ANTERIOR FUSION CERVICAL SPINE  10/2019   C3-C5   BREAST CYST EXCISION Left    CERVICAL SPINE SURGERY  10/2011   CESAREAN SECTION     DILATION AND CURETTAGE OF UTERUS  1986   KNEE ARTHROSCOPY Left 07/22/2013   Procedure: LEFT KNEE ARTHROSCOPY WITH DEBRIDEMENT CHONDROPLASTY, REMOVAL LOOSE BODIES, PARTIAL MEDIAL MENISCECTOMY, OPEN EXCISION OF PES GANGLION;  Surgeon: [name], MD;  Location: [facility];  Service: Orthopedics;  Laterality: Left;  NO SPECIMEN SENT PER DR. [name] ORDER.   KNEE SURGERY Left 11/21/2021   REPLACEMENT TOTAL KNEE Left 10/15/2023   SPINE SURGERY  10/26/2019   TOTAL ABDOMINAL HYSTERECTOMY  05/2010   TAH   TUBAL LIGATION  1989   Patient Active Problem List   Diagnosis Date Noted   History of left knee replacement 11/06/2023   MDD (major depressive disorder), recurrent episode (HCC) 08/02/2022   Osteoarthritis of left knee 06/23/2022   Osteoarthritis of right acromioclavicular joint 12/07/2021    Arthritis 02/12/2021   Prediabetes 02/12/2021   Mixed stress and urge urinary incontinence 02/12/2021   Obesity (BMI 30.0-34.9) 02/12/2021   Low ferritin 02/10/2021   Vitamin D insufficiency 02/10/2021   Vitamin B12 deficiency 02/10/2021   COVID-19 long hauler 10/13/2020   Neuropathy 08/22/2020   Chronic pain 02/10/2016   Paget disease of bone 12/28/2015   Esophageal reflux 08/15/2015   Mixed hyperlipidemia 08/15/2015   Chronic pain in shoulder 08/15/2015   Anxiety and depression 11/25/2013   GAD (generalized anxiety disorder) 11/25/2013   IBS (irritable bowel syndrome) 01/02/2013    PCP: Sonny Masters, FNP  REFERRING PROVIDER: Ollen Gross, MD   REFERRING DIAG: Osteoarthritis of left knee joint  THERAPY DIAG:  Acute pain of left knee  Stiffness of left knee, not elsewhere classified  Localized edema  Rationale for Evaluation and Treatment: Rehabilitation  ONSET DATE: 10/15/23  SUBJECTIVE:   SUBJECTIVE STATEMENT: Pain at a 5 today. PERTINENT HISTORY: Neuropathy, Paget disease, arthritis, anxiety, depression, chronic pain, and history of COVID-19 PAIN:  Are you having pain? Yes: NPRS scale: 5/10 Pain location: left thigh and knee Pain description: constant aching and throbbing with intermittent soreness Aggravating factors: getting into and out of bed Relieving factors: laying down, ice, and medication  PRECAUTIONS: Fall (due to poor  vision)   RED FLAGS: None   WEIGHT BEARING RESTRICTIONS: No  FALLS:  Has patient fallen in last 6 months? No  LIVING ENVIRONMENT: Lives with: lives with an adult companion Lives in: House/apartment Stairs: Yes: External: 3 steps; none Has following equipment at home: Walker - 2 wheeled  OCCUPATION: disabled  PLOF: Independent  PATIENT GOALS: reduced pain, improved, be able to watch her granddaughter, and improved sleep  NEXT MD VISIT: 10/29/23  OBJECTIVE:  Note: Objective measures were completed at Evaluation  unless otherwise noted.  PATIENT SURVEYS:  FOTO 21.03  COGNITION: Overall cognitive status: Within functional limits for tasks assessed     SENSATION: Patient reports left lateral knee numbness since surgery.   EDEMA:  Circumferential: L tibiofemoral joint line: 46.5 cm R tibiofemoral joint line: 39.0 cm  PALPATION: TTP: right quadriceps, IT band, and lateral knee  LOWER EXTREMITY ROM:  Active ROM Right eval Left eval Left 10/28/23 Left 11/18/23  Hip flexion      Hip extension      Hip abduction      Hip adduction      Hip internal rotation      Hip external rotation      Knee flexion 140 64/ 38 (PROM)  100 105A/110P  Knee extension 0 21    Ankle dorsiflexion      Ankle plantarflexion      Ankle inversion      Ankle eversion       (Blank rows = not tested)  LOWER EXTREMITY MMT: not tested due to surgical condition  LOWER EXTREMITY SPECIAL TESTS:  Not tested due to surgical condition  GAIT: Assistive device utilized: Environmental consultant - 2 wheeled Level of assistance: Modified independence Comments: decreased gait speed and stride length with left knee flexed in stance phase                                                                                                                                TREATMENT DATE:    11/18/23:                                    EXERCISE LOG  Exercise Repetitions and Resistance Comments  Nustep  Level 3 x 10 minutes   Recumbent bike  Seat 5 x 5 minutes.   Knee ext 10# x 2 minutes   Ham curls 30# x 2 minutes.       PROM x 5 minutes into left knee flexion and extension f/b LE elevation and vasopnuematic on low and IFC at 80-150 Hz on 40% scan x 20 minutes to patient's left knee. Normal modality response following removal of modality.                                   11/14/23  EXERCISE LOG  Exercise Repetitions and Resistance Comments  Nustep  L4 x 15 minutes; seat 6-5   Rocker board  3.5 minutes   Lunges onto step  14" step x 2  minutes  LLE on step for knee flexion  Marching on foam  2.5 minutes   Semi-tandem on foam  2 x 1 minutes each         Blank cell = exercise not performed today  Modalities: no redness or adverse reaction to today's modalities  Date:  Unattended Estim: left hip adductors and iliotibial band, IFC @ 80-150 Hz w/ 40% scan , 15 mins, Pain and Edema Vaso: Knee, 34 degrees; low pressure, 15 mins, Pain and Edema                                   11/12/23 EXERCISE LOG  Exercise Repetitions and Resistance Comments  Nustep Level 3 x 15 minutes   Rockerboard In parallel bars x 3 minutes               In supine:  Gentle LLLDS x 5 minutes into left knee flexion and extension f/b  LE elevation and vasopneumatic on low with low-level IFC at 80-150 Hz x 20 minutes to patient's left knee.  Normal modality response following removal of modality.                                     11/07/23 EXERCISE LOG  Exercise Repetitions and Resistance Comments  Nustep  L4 x 5 minutes; seat 7-5   Recumbent bike  L2 x 15 minutes; 6   Standing gastroc stretch  3 minutes   LAQ 2# x 3 minutes LLE only  Standing HS stretch  4 x 15 seconds   Lunges onto step  6" step x 15 reps     Blank cell = exercise not performed today  Modalities: no adverse reaction to today's modalities  Date:  Vaso: Knee, 34 degrees; low pressure, 15 mins, Pain and Edema          PATIENT EDUCATION:  Education details: healing and goals for physical therapy Person educated: Patient Education method: Explanation Education comprehension: verbalized understanding  HOME EXERCISE PROGRAM:   ASSESSMENT:  CLINICAL IMPRESSION: Th patient did a great job and was able to tolerate the recumbent bike again at seat 6.  She achieved active left knee flexion at 105 degrees and passive to 110 degrees.    OBJECTIVE IMPAIRMENTS: Abnormal gait, decreased activity tolerance, decreased balance, decreased mobility, difficulty walking, decreased ROM,  decreased strength, hypomobility, increased edema, impaired flexibility, impaired sensation, impaired tone, and pain.   ACTIVITY LIMITATIONS: carrying, lifting, standing, squatting, sleeping, stairs, transfers, dressing, locomotion level, and caring for others  PARTICIPATION LIMITATIONS: meal prep, cleaning, laundry, driving, shopping, and community activity  PERSONAL FACTORS: Past/current experiences, Transportation, and 3+ comorbidities: Neuropathy, Paget disease, arthritis, anxiety, depression, chronic pain, and history of COVID-19  are also affecting patient's functional outcome.   REHAB POTENTIAL: Good  CLINICAL DECISION MAKING: Evolving/moderate complexity  EVALUATION COMPLEXITY: Moderate   GOALS: Goals reviewed with patient? Yes  LONG TERM GOALS: Target date: 11/15/23  Patient will be independent with her HEP.  Baseline:  Goal status: Partially met.  2.  Patient will improve her active left knee extension within 5 degrees of neutral for improved gait mechanics.  Baseline:  Goal status: In progress.  3.  Patient will improve her right knee flexion to at least 120 degrees for improved function navigating stairs.  Baseline:  Goal status: 11/18/23:  105A/110P  4.  Patient will be able to safely navigate with a cane or the least restrictive assistive device for improved household mobility.  Baseline:  Goal status:In progress.  5.  Patient will be able to navigate at least 3 steps with a reciprocal pattern for improved household mobility.  Baseline:  Goal status: In progress.  PLAN:  PT FREQUENCY: 2-3x/week  PT DURATION: 4 weeks  PLANNED INTERVENTIONS: 97164- PT Re-evaluation, 97110-Therapeutic exercises, 97530- Therapeutic activity, 97112- Neuromuscular re-education, 97535- Self Care, 16109- Manual therapy, 97116- Gait training, 97014- Electrical stimulation (unattended), 97016- Vasopneumatic device, Patient/Family education, Balance training, Stair training, Joint  mobilization, Cryotherapy, and Moist heat  PLAN FOR NEXT SESSION: Nustep, quad sets, heel slides, PROM, and modalities as needed  Progress Note Reporting Period 10/18/23 to 11/18/23.  See note below for Objective Data and Assessment of Progress/Goals. Patient is progressing toward goals.  She was able to make forward revolutions on the recumbent bike and achieved active left knee flexion to 105 degrees today.      Marvon Shillingburg, [place], PT 11/18/2023, 5:00 PM

## 2023-11-19 DIAGNOSIS — Z5189 Encounter for other specified aftercare: Secondary | ICD-10-CM | POA: Diagnosis not present

## 2023-11-20 ENCOUNTER — Encounter (HOSPITAL_COMMUNITY): Payer: Self-pay | Admitting: Clinical

## 2023-11-20 ENCOUNTER — Ambulatory Visit (INDEPENDENT_AMBULATORY_CARE_PROVIDER_SITE_OTHER): Payer: Medicare HMO | Admitting: Clinical

## 2023-11-20 ENCOUNTER — Ambulatory Visit: Payer: Medicare HMO

## 2023-11-20 DIAGNOSIS — M25562 Pain in left knee: Secondary | ICD-10-CM

## 2023-11-20 DIAGNOSIS — R600 Localized edema: Secondary | ICD-10-CM | POA: Diagnosis not present

## 2023-11-20 DIAGNOSIS — M25662 Stiffness of left knee, not elsewhere classified: Secondary | ICD-10-CM

## 2023-11-20 DIAGNOSIS — Z91199 Patient's noncompliance with other medical treatment and regimen due to unspecified reason: Secondary | ICD-10-CM

## 2023-11-20 NOTE — Progress Notes (Signed)
Julia [name] was scheduled to attend group therapy on 11/20/23 from 5:00-6:00pm.  A MyChart message was sent to them to remind them that this group is held via [employer] instead of through [place].  An email was sent with the same reminder, as well as the link to the meeting on [employer].  When it was noted that Julia [name] had not viewed the MyChart message, a reminder phone call was made as well.  A HIPAA-compliant voicemail was left.  Julia [name] did not show up for the group despite these reminders.   Encounter Diagnosis  Name Primary?   No-show for appointment Yes     [name], LCSW 11/20/2023, 6:24 PM

## 2023-11-20 NOTE — Therapy (Signed)
OUTPATIENT PHYSICAL THERAPY LOWER EXTREMITY   Patient Name: Julia [name] MRN: [id number] DOB:[birth year], 63 y.o., female Today's Date: 11/20/2023  END OF SESSION:  PT End of Session - 11/20/23 1057     Visit Number 11    Number of Visits 16    Date for PT Re-Evaluation 12/13/23    PT Start Time 1056    PT Stop Time 1148    PT Time Calculation (min) 52 min    Activity Tolerance Patient tolerated treatment well    Behavior During Therapy [facility] for tasks assessed/performed                Past Medical History:  Diagnosis Date   Anxiety    Arthritis    Complication of anesthesia    slow to wake up-does not take much medicine to sedate her   Depression    Esophageal reflux    Hyperlipidemia    Low ferritin 02/10/2021   Paget disease of bone    Pneumonia due to COVID-19 virus 05/20/2020   Vitamin B12 deficiency 02/10/2021   Vitamin D insufficiency 02/10/2021   Wears glasses    Past Surgical History:  Procedure Laterality Date   ANTERIOR FUSION CERVICAL SPINE  10/2019   C3-C5   BREAST CYST EXCISION Left    CERVICAL SPINE SURGERY  10/2011   CESAREAN SECTION     DILATION AND CURETTAGE OF UTERUS  1986   KNEE ARTHROSCOPY Left 07/22/2013   Procedure: LEFT KNEE ARTHROSCOPY WITH DEBRIDEMENT CHONDROPLASTY, REMOVAL LOOSE BODIES, PARTIAL MEDIAL MENISCECTOMY, OPEN EXCISION OF PES GANGLION;  Surgeon: [name], MD;  Location: [facility];  Service: Orthopedics;  Laterality: Left;  NO SPECIMEN SENT PER DR. [name] ORDER.   KNEE SURGERY Left 11/21/2021   REPLACEMENT TOTAL KNEE Left 10/15/2023   SPINE SURGERY  10/26/2019   TOTAL ABDOMINAL HYSTERECTOMY  05/2010   TAH   TUBAL LIGATION  1989   Patient Active Problem List   Diagnosis Date Noted   History of left knee replacement 11/06/2023   MDD (major depressive disorder), recurrent episode (HCC) 08/02/2022   Osteoarthritis of left knee 06/23/2022   Osteoarthritis of right acromioclavicular joint 12/07/2021    Arthritis 02/12/2021   Prediabetes 02/12/2021   Mixed stress and urge urinary incontinence 02/12/2021   Obesity (BMI 30.0-34.9) 02/12/2021   Low ferritin 02/10/2021   Vitamin D insufficiency 02/10/2021   Vitamin B12 deficiency 02/10/2021   COVID-19 long hauler 10/13/2020   Neuropathy 08/22/2020   Chronic pain 02/10/2016   Paget disease of bone 12/28/2015   Esophageal reflux 08/15/2015   Mixed hyperlipidemia 08/15/2015   Chronic pain in shoulder 08/15/2015   Anxiety and depression 11/25/2013   GAD (generalized anxiety disorder) 11/25/2013   IBS (irritable bowel syndrome) 01/02/2013    PCP: Sonny Masters, FNP  REFERRING PROVIDER: Ollen Gross, MD   REFERRING DIAG: Osteoarthritis of left knee joint  THERAPY DIAG:  Acute pain of left knee  Stiffness of left knee, not elsewhere classified  Localized edema  Rationale for Evaluation and Treatment: Rehabilitation  ONSET DATE: 10/15/23  SUBJECTIVE:   SUBJECTIVE STATEMENT: Pain at a 5 today. PERTINENT HISTORY: Neuropathy, Paget disease, arthritis, anxiety, depression, chronic pain, and history of COVID-19 PAIN:  Are you having pain? Yes: NPRS scale: 5/10 Pain location: left thigh and knee Pain description: constant aching and throbbing with intermittent soreness Aggravating factors: getting into and out of bed Relieving factors: laying down, ice, and medication  PRECAUTIONS: Fall (due to poor  vision)   RED FLAGS: None   WEIGHT BEARING RESTRICTIONS: No  FALLS:  Has patient fallen in last 6 months? No  LIVING ENVIRONMENT: Lives with: lives with an adult companion Lives in: House/apartment Stairs: Yes: External: 3 steps; none Has following equipment at home: Walker - 2 wheeled  OCCUPATION: disabled  PLOF: Independent  PATIENT GOALS: reduced pain, improved, be able to watch her granddaughter, and improved sleep  NEXT MD VISIT: 10/29/23  OBJECTIVE:  Note: Objective measures were completed at Evaluation  unless otherwise noted.  PATIENT SURVEYS:  FOTO 21.03  COGNITION: Overall cognitive status: Within functional limits for tasks assessed     SENSATION: Patient reports left lateral knee numbness since surgery.   EDEMA:  Circumferential: L tibiofemoral joint line: 46.5 cm R tibiofemoral joint line: 39.0 cm  PALPATION: TTP: right quadriceps, IT band, and lateral knee  LOWER EXTREMITY ROM:  Active ROM Right eval Left eval Left 10/28/23 Left 11/18/23  Hip flexion      Hip extension      Hip abduction      Hip adduction      Hip internal rotation      Hip external rotation      Knee flexion 140 64/ 38 (PROM)  100 105A/110P  Knee extension 0 21    Ankle dorsiflexion      Ankle plantarflexion      Ankle inversion      Ankle eversion       (Blank rows = not tested)  LOWER EXTREMITY MMT: not tested due to surgical condition  LOWER EXTREMITY SPECIAL TESTS:  Not tested due to surgical condition  GAIT: Assistive device utilized: Environmental consultant - 2 wheeled Level of assistance: Modified independence Comments: decreased gait speed and stride length with left knee flexed in stance phase                                                                                                                                TREATMENT DATE:    11/20/23:                                   EXERCISE LOG  Exercise Repetitions and Resistance Comments  Nustep  Level 4 x 10 minutes; seat 5   Recumbent bike  Seat 5-2 x 10 minutes.   Knee ext 10# x 3.5 minutes   Ham curls 30# x 3.5 minutes.        Modalities  Date:  Unattended Estim: Knee, IFC 80-150 Hz, 15 mins, Pain Vaso: Knee, 34 degrees; low pressure, 15 mins, Pain                                    11/14/23  EXERCISE LOG  Exercise Repetitions and Resistance Comments  Nustep  L4 x 15  minutes; seat 6-5   Rocker board  3.5 minutes   Lunges onto step  14" step x 2 minutes  LLE on step for knee flexion  Marching on foam  2.5 minutes    Semi-tandem on foam  2 x 1 minutes each         Blank cell = exercise not performed today  Modalities: no redness or adverse reaction to today's modalities  Date:  Unattended Estim: left hip adductors and iliotibial band, IFC @ 80-150 Hz w/ 40% scan , 15 mins, Pain and Edema Vaso: Knee, 34 degrees; low pressure, 15 mins, Pain and Edema                                   11/12/23 EXERCISE LOG  Exercise Repetitions and Resistance Comments  Nustep Level 3 x 15 minutes   Rockerboard In parallel bars x 3 minutes               In supine:  Gentle LLLDS x 5 minutes into left knee flexion and extension f/b  LE elevation and vasopneumatic on low with low-level IFC at 80-150 Hz x 20 minutes to patient's left knee.  Normal modality response following removal of modality.                                        PATIENT EDUCATION:  Education details: healing and goals for physical therapy Person educated: Patient Education method: Explanation Education comprehension: verbalized understanding  HOME EXERCISE PROGRAM:   ASSESSMENT:  CLINICAL IMPRESSION: Pt arrives for today's treatment session reporting 5/10 left knee pain.  Pt able to tolerate progress to seat 2 today on the recumbent bike today without difficulty.  Pt able to tolerate increased time with cybex exercises today with minimal fatigue noted.  Normal responses to estim and vaso noted upon removal.  Pt reported decreased pain at completion of today's treatment session.   OBJECTIVE IMPAIRMENTS: Abnormal gait, decreased activity tolerance, decreased balance, decreased mobility, difficulty walking, decreased ROM, decreased strength, hypomobility, increased edema, impaired flexibility, impaired sensation, impaired tone, and pain.   ACTIVITY LIMITATIONS: carrying, lifting, standing, squatting, sleeping, stairs, transfers, dressing, locomotion level, and caring for others  PARTICIPATION LIMITATIONS: meal prep, cleaning, laundry, driving,  shopping, and community activity  PERSONAL FACTORS: Past/current experiences, Transportation, and 3+ comorbidities: Neuropathy, Paget disease, arthritis, anxiety, depression, chronic pain, and history of COVID-19  are also affecting patient's functional outcome.   REHAB POTENTIAL: Good  CLINICAL DECISION MAKING: Evolving/moderate complexity  EVALUATION COMPLEXITY: Moderate   GOALS: Goals reviewed with patient? Yes  LONG TERM GOALS: Target date: 11/15/23  Patient will be independent with her HEP.  Baseline:  Goal status: Partially met.  2.  Patient will improve her active left knee extension within 5 degrees of neutral for improved gait mechanics.  Baseline:  Goal status: In progress.  3.  Patient will improve her right knee flexion to at least 120 degrees for improved function navigating stairs.  Baseline:  Goal status: 11/18/23:  105A/110P  4.  Patient will be able to safely navigate with a cane or the least restrictive assistive device for improved household mobility.  Baseline:  Goal status:In progress.  5.  Patient will be able to navigate at least 3 steps with a reciprocal pattern for improved household mobility.  Baseline:  Goal  status: In progress.  PLAN:  PT FREQUENCY: 2-3x/week  PT DURATION: 4 weeks  PLANNED INTERVENTIONS: 97164- PT Re-evaluation, 97110-Therapeutic exercises, 97530- Therapeutic activity, 97112- Neuromuscular re-education, 97535- Self Care, 30865- Manual therapy, [phone]- Gait training, 97014- Electrical stimulation (unattended), 97016- Vasopneumatic device, Patient/Family education, Balance training, Stair training, Joint mobilization, Cryotherapy, and Moist heat  PLAN FOR NEXT SESSION: Nustep, quad sets, heel slides, PROM, and modalities as needed   Newman Pies, PTA 11/20/2023, 11:48 AM

## 2023-11-26 ENCOUNTER — Ambulatory Visit: Payer: Medicare HMO

## 2023-11-26 DIAGNOSIS — M25662 Stiffness of left knee, not elsewhere classified: Secondary | ICD-10-CM

## 2023-11-26 DIAGNOSIS — R600 Localized edema: Secondary | ICD-10-CM

## 2023-11-26 DIAGNOSIS — M25562 Pain in left knee: Secondary | ICD-10-CM

## 2023-11-26 NOTE — Therapy (Signed)
 OUTPATIENT PHYSICAL THERAPY LOWER EXTREMITY   Patient Name: Julia [name] MRN: [id number] DOB:May 06, 1961, 63 y.o., female Today's Date: 11/26/2023  END OF SESSION:  PT End of Session - 11/26/23 1432     Visit Number 12    Number of Visits 16    Date for PT Re-Evaluation 12/13/23    PT Start Time 1430    PT Stop Time 1524    PT Time Calculation (min) 54 min    Activity Tolerance Patient tolerated treatment well    Behavior During Therapy [facility] for tasks assessed/performed                Past Medical History:  Diagnosis Date   Anxiety    Arthritis    Complication of anesthesia    slow to wake up-does not take much medicine to sedate her   Depression    Esophageal reflux    Hyperlipidemia    Low ferritin 02/10/2021   Paget disease of bone    Pneumonia due to COVID-19 virus 05/20/2020   Vitamin B12 deficiency 02/10/2021   Vitamin D insufficiency 02/10/2021   Wears glasses    Past Surgical History:  Procedure Laterality Date   ANTERIOR FUSION CERVICAL SPINE  10/2019   C3-C5   BREAST CYST EXCISION Left    CERVICAL SPINE SURGERY  10/2011   CESAREAN SECTION     DILATION AND CURETTAGE OF UTERUS  1986   KNEE ARTHROSCOPY Left 07/22/2013   Procedure: LEFT KNEE ARTHROSCOPY WITH DEBRIDEMENT CHONDROPLASTY, REMOVAL LOOSE BODIES, PARTIAL MEDIAL MENISCECTOMY, OPEN EXCISION OF PES GANGLION;  Surgeon: [name], MD;  Location: [facility];  Service: Orthopedics;  Laterality: Left;  NO SPECIMEN SENT PER DR. [name] ORDER.   KNEE SURGERY Left 11/21/2021   REPLACEMENT TOTAL KNEE Left 10/15/2023   SPINE SURGERY  10/26/2019   TOTAL ABDOMINAL HYSTERECTOMY  05/2010   TAH   TUBAL LIGATION  1989   Patient Active Problem List   Diagnosis Date Noted   History of left knee replacement 11/06/2023   MDD (major depressive disorder), recurrent episode (HCC) 08/02/2022   Osteoarthritis of left knee 06/23/2022   Osteoarthritis of right acromioclavicular joint 12/07/2021    Arthritis 02/12/2021   Prediabetes 02/12/2021   Mixed stress and urge urinary incontinence 02/12/2021   Obesity (BMI 30.0-34.9) 02/12/2021   Low ferritin 02/10/2021   Vitamin D insufficiency 02/10/2021   Vitamin B12 deficiency 02/10/2021   COVID-19 long hauler 10/13/2020   Neuropathy 08/22/2020   Chronic pain 02/10/2016   Paget disease of bone 12/28/2015   Esophageal reflux 08/15/2015   Mixed hyperlipidemia 08/15/2015   Chronic pain in shoulder 08/15/2015   Anxiety and depression 11/25/2013   GAD (generalized anxiety disorder) 11/25/2013   IBS (irritable bowel syndrome) 01/02/2013    PCP: Sonny Masters, FNP  REFERRING PROVIDER: Ollen Gross, MD   REFERRING DIAG: Osteoarthritis of left knee joint  THERAPY DIAG:  Acute pain of left knee  Stiffness of left knee, not elsewhere classified  Localized edema  Rationale for Evaluation and Treatment: Rehabilitation  ONSET DATE: 10/15/23  SUBJECTIVE:   SUBJECTIVE STATEMENT: Pain at a 5 today. PERTINENT HISTORY: Neuropathy, Paget disease, arthritis, anxiety, depression, chronic pain, and history of COVID-19 PAIN:  Are you having pain? Yes: NPRS scale: 5/10 Pain location: left thigh and knee Pain description: constant aching and throbbing with intermittent soreness Aggravating factors: getting into and out of bed Relieving factors: laying down, ice, and medication  PRECAUTIONS: Fall (due to poor  vision)   RED FLAGS: None   WEIGHT BEARING RESTRICTIONS: No  FALLS:  Has patient fallen in last 6 months? No  LIVING ENVIRONMENT: Lives with: lives with an adult companion Lives in: House/apartment Stairs: Yes: External: 3 steps; none Has following equipment at home: Walker - 2 wheeled  OCCUPATION: disabled  PLOF: Independent  PATIENT GOALS: reduced pain, improved, be able to watch her granddaughter, and improved sleep  NEXT MD VISIT: 10/29/23  OBJECTIVE:  Note: Objective measures were completed at Evaluation  unless otherwise noted.  PATIENT SURVEYS:  FOTO 21.03  COGNITION: Overall cognitive status: Within functional limits for tasks assessed     SENSATION: Patient reports left lateral knee numbness since surgery.   EDEMA:  Circumferential: L tibiofemoral joint line: 46.5 cm R tibiofemoral joint line: 39.0 cm  PALPATION: TTP: right quadriceps, IT band, and lateral knee  LOWER EXTREMITY ROM:  Active ROM Right eval Left eval Left 10/28/23 Left 11/18/23  Hip flexion      Hip extension      Hip abduction      Hip adduction      Hip internal rotation      Hip external rotation      Knee flexion 140 64/ 38 (PROM)  100 105A/110P  Knee extension 0 21    Ankle dorsiflexion      Ankle plantarflexion      Ankle inversion      Ankle eversion       (Blank rows = not tested)  LOWER EXTREMITY MMT: not tested due to surgical condition  LOWER EXTREMITY SPECIAL TESTS:  Not tested due to surgical condition  GAIT: Assistive device utilized: Environmental consultant - 2 wheeled Level of assistance: Modified independence Comments: decreased gait speed and stride length with left knee flexed in stance phase                                                                                                                                TREATMENT DATE:    11/26/23:                                   EXERCISE LOG  Exercise Repetitions and Resistance Comments  Nustep  Level 4 x 5 minutes; seat 5   Recumbent bike  Seat 2 x 15 minutes; seat 2   Knee ext 20# x 2.5 minutes   Ham curls 40# x 2.5 minutes.   Leg Press 1 Plate; seat 5; x 2.5 mins   Rockerboard 4 mins    Modalities  Date:  Vaso: Knee, 34 degrees; low pressure, 15 mins, Pain and Edema                                    11/14/23  EXERCISE LOG  Exercise Repetitions and Resistance  Comments  Nustep  L4 x 15 minutes; seat 6-5   Rocker board  3.5 minutes   Lunges onto step  14" step x 2 minutes  LLE on step for knee flexion  Marching on foam  2.5  minutes   Semi-tandem on foam  2 x 1 minutes each         Blank cell = exercise not performed today  Modalities: no redness or adverse reaction to today's modalities  Date:  Unattended Estim: left hip adductors and iliotibial band, IFC @ 80-150 Hz w/ 40% scan , 15 mins, Pain and Edema Vaso: Knee, 34 degrees; low pressure, 15 mins, Pain and Edema                                   11/12/23 EXERCISE LOG  Exercise Repetitions and Resistance Comments  Nustep Level 3 x 15 minutes   Rockerboard In parallel bars x 3 minutes               In supine:  Gentle LLLDS x 5 minutes into left knee flexion and extension f/b  LE elevation and vasopneumatic on low with low-level IFC at 80-150 Hz x 20 minutes to patient's left knee.  Normal modality response following removal of modality.                                        PATIENT EDUCATION:  Education details: healing and goals for physical therapy Person educated: Patient Education method: Explanation Education comprehension: verbalized understanding  HOME EXERCISE PROGRAM:   ASSESSMENT:  CLINICAL IMPRESSION: Pt arrives for today's treatment session reporting 5/10 left knee pain.  Pt able to progress to seat 2 on the recumbent bike today with minimal discomfort.  Pt able to tolerate increased resistance with cybex knee flexion and extension today with fatigue noted.  Pt introduced to cybex leg press with min cues required for eccentric control and to avoid "locking out" BLEs.  Normal responses to vaso noted upon removal.  Pt reported decreased pain at completion of today's treatment session.  OBJECTIVE IMPAIRMENTS: Abnormal gait, decreased activity tolerance, decreased balance, decreased mobility, difficulty walking, decreased ROM, decreased strength, hypomobility, increased edema, impaired flexibility, impaired sensation, impaired tone, and pain.   ACTIVITY LIMITATIONS: carrying, lifting, standing, squatting, sleeping, stairs, transfers,  dressing, locomotion level, and caring for others  PARTICIPATION LIMITATIONS: meal prep, cleaning, laundry, driving, shopping, and community activity  PERSONAL FACTORS: Past/current experiences, Transportation, and 3+ comorbidities: Neuropathy, Paget disease, arthritis, anxiety, depression, chronic pain, and history of COVID-19  are also affecting patient's functional outcome.   REHAB POTENTIAL: Good  CLINICAL DECISION MAKING: Evolving/moderate complexity  EVALUATION COMPLEXITY: Moderate   GOALS: Goals reviewed with patient? Yes  LONG TERM GOALS: Target date: 11/15/23  Patient will be independent with her HEP.  Baseline:  Goal status: Partially met.  2.  Patient will improve her active left knee extension within 5 degrees of neutral for improved gait mechanics.  Baseline:  Goal status: In progress.  3.  Patient will improve her right knee flexion to at least 120 degrees for improved function navigating stairs.  Baseline:  Goal status: 11/18/23:  105A/110P  4.  Patient will be able to safely navigate with a cane or the least restrictive assistive device for improved household mobility.  Baseline:  Goal status:In progress.  5.  Patient will be able to navigate at least 3 steps with a reciprocal pattern for improved household mobility.  Baseline:  Goal status: In progress.  PLAN:  PT FREQUENCY: 2-3x/week  PT DURATION: 4 weeks  PLANNED INTERVENTIONS: 97164- PT Re-evaluation, 97110-Therapeutic exercises, 97530- Therapeutic activity, 97112- Neuromuscular re-education, 97535- Self Care, 16109- Manual therapy, [phone]- Gait training, 97014- Electrical stimulation (unattended), 97016- Vasopneumatic device, Patient/Family education, Balance training, Stair training, Joint mobilization, Cryotherapy, and Moist heat  PLAN FOR NEXT SESSION: Nustep, quad sets, heel slides, PROM, and modalities as needed   Newman Pies, PTA 11/26/2023, 3:25 PM

## 2023-11-28 ENCOUNTER — Ambulatory Visit: Payer: Medicare HMO

## 2023-11-28 DIAGNOSIS — M25562 Pain in left knee: Secondary | ICD-10-CM | POA: Diagnosis not present

## 2023-11-28 DIAGNOSIS — R600 Localized edema: Secondary | ICD-10-CM

## 2023-11-28 DIAGNOSIS — M25662 Stiffness of left knee, not elsewhere classified: Secondary | ICD-10-CM | POA: Diagnosis not present

## 2023-11-28 NOTE — Therapy (Signed)
 OUTPATIENT PHYSICAL THERAPY LOWER EXTREMITY   Patient Name: Julia [name] MRN: [id number] DOB:[birth year], 63 y.o., female Today's Date: 11/28/2023  END OF SESSION:  PT End of Session - 11/28/23 1413     Visit Number 13    Number of Visits 16    Date for PT Re-Evaluation 12/13/23    PT Start Time 1407    PT Stop Time 1504    PT Time Calculation (min) 57 min    Activity Tolerance Patient tolerated treatment well    Behavior During Therapy [facility] for tasks assessed/performed                 Past Medical History:  Diagnosis Date   Anxiety    Arthritis    Complication of anesthesia    slow to wake up-does not take much medicine to sedate her   Depression    Esophageal reflux    Hyperlipidemia    Low ferritin 02/10/2021   Paget disease of bone    Pneumonia due to COVID-19 virus 05/20/2020   Vitamin B12 deficiency 02/10/2021   Vitamin D insufficiency 02/10/2021   Wears glasses    Past Surgical History:  Procedure Laterality Date   ANTERIOR FUSION CERVICAL SPINE  10/2019   C3-C5   BREAST CYST EXCISION Left    CERVICAL SPINE SURGERY  10/2011   CESAREAN SECTION     DILATION AND CURETTAGE OF UTERUS  1986   KNEE ARTHROSCOPY Left 07/22/2013   Procedure: LEFT KNEE ARTHROSCOPY WITH DEBRIDEMENT CHONDROPLASTY, REMOVAL LOOSE BODIES, PARTIAL MEDIAL MENISCECTOMY, OPEN EXCISION OF PES GANGLION;  Surgeon: [name], MD;  Location: [facility];  Service: Orthopedics;  Laterality: Left;  NO SPECIMEN SENT PER DR. [name] ORDER.   KNEE SURGERY Left 11/21/2021   REPLACEMENT TOTAL KNEE Left 10/15/2023   SPINE SURGERY  10/26/2019   TOTAL ABDOMINAL HYSTERECTOMY  05/2010   TAH   TUBAL LIGATION  1989   Patient Active Problem List   Diagnosis Date Noted   History of left knee replacement 11/06/2023   MDD (major depressive disorder), recurrent episode (HCC) 08/02/2022   Osteoarthritis of left knee 06/23/2022   Osteoarthritis of right acromioclavicular joint 12/07/2021    Arthritis 02/12/2021   Prediabetes 02/12/2021   Mixed stress and urge urinary incontinence 02/12/2021   Obesity (BMI 30.0-34.9) 02/12/2021   Low ferritin 02/10/2021   Vitamin D insufficiency 02/10/2021   Vitamin B12 deficiency 02/10/2021   COVID-19 long hauler 10/13/2020   Neuropathy 08/22/2020   Chronic pain 02/10/2016   Paget disease of bone 12/28/2015   Esophageal reflux 08/15/2015   Mixed hyperlipidemia 08/15/2015   Chronic pain in shoulder 08/15/2015   Anxiety and depression 11/25/2013   GAD (generalized anxiety disorder) 11/25/2013   IBS (irritable bowel syndrome) 01/02/2013    PCP: Sonny Masters, FNP  REFERRING PROVIDER: Ollen Gross, MD   REFERRING DIAG: Osteoarthritis of left knee joint  THERAPY DIAG:  Acute pain of left knee  Stiffness of left knee, not elsewhere classified  Localized edema  Rationale for Evaluation and Treatment: Rehabilitation  ONSET DATE: 10/15/23  SUBJECTIVE:   SUBJECTIVE STATEMENT: Patient reports that her main problems still is sleeping.  PERTINENT HISTORY: Neuropathy, Paget disease, arthritis, anxiety, depression, chronic pain, and history of COVID-19 PAIN:  Are you having pain? Yes: NPRS scale: 5/10 Pain location: left thigh and knee Pain description: constant aching and throbbing with intermittent soreness Aggravating factors: getting into and out of bed Relieving factors: laying down, ice, and medication  PRECAUTIONS: Fall (due to poor vision)   RED FLAGS: None   WEIGHT BEARING RESTRICTIONS: No  FALLS:  Has patient fallen in last 6 months? No  LIVING ENVIRONMENT: Lives with: lives with an adult companion Lives in: House/apartment Stairs: Yes: External: 3 steps; none Has following equipment at home: Walker - 2 wheeled  OCCUPATION: disabled  PLOF: Independent  PATIENT GOALS: reduced pain, improved, be able to watch her granddaughter, and improved sleep  NEXT MD VISIT: 10/29/23  OBJECTIVE:  Note: Objective  measures were completed at Evaluation unless otherwise noted.  PATIENT SURVEYS:  FOTO 21.03  COGNITION: Overall cognitive status: Within functional limits for tasks assessed     SENSATION: Patient reports left lateral knee numbness since surgery.   EDEMA:  Circumferential: L tibiofemoral joint line: 46.5 cm R tibiofemoral joint line: 39.0 cm  PALPATION: TTP: right quadriceps, IT band, and lateral knee  LOWER EXTREMITY ROM:  Active ROM Right eval Left eval Left 10/28/23 Left 11/18/23  Hip flexion      Hip extension      Hip abduction      Hip adduction      Hip internal rotation      Hip external rotation      Knee flexion 140 64/ 38 (PROM)  100 105A/110P  Knee extension 0 21    Ankle dorsiflexion      Ankle plantarflexion      Ankle inversion      Ankle eversion       (Blank rows = not tested)  LOWER EXTREMITY MMT: not tested due to surgical condition  LOWER EXTREMITY SPECIAL TESTS:  Not tested due to surgical condition  GAIT: Assistive device utilized: Environmental consultant - 2 wheeled Level of assistance: Modified independence Comments: decreased gait speed and stride length with left knee flexed in stance phase                                                                                      TREATMENT DATE:                                     11/28/23 EXERCISE LOG  Exercise Repetitions and Resistance Comments  Nustep  L4 x 5 minutes; seat 5   Recumbent bike  L4 x 15 minutes; seat 3-2   Cybex knee flexion 40# x 2.5 minutes   Cybex knee extension  20# x 2.5 minutes   Leg press  1.5 plates x 2.5 minutes @ seat 5   Rocker board  4 minutes    Blank cell = exercise not performed today  Modalities: no redness or adverse reaction to today's modalities  Date:  Unattended Estim: left hamstring, pre mod @ 80-150 Hz, 15 mins, Pain and Tone Vaso: Knee, 34 degrees; low pressure, 15 mins, Pain and Tone  11/26/23: EXERCISE LOG  Exercise Repetitions and Resistance Comments   Nustep  Level 4 x 5 minutes; seat 5   Recumbent bike  Seat 2 x 15 minutes; seat 2   Knee ext 20# x 2.5 minutes   Ham curls 40# x  2.5 minutes.   Leg Press 1 Plate; seat 5; x 2.5 mins   Rockerboard 4 mins    Modalities  Date:  Vaso: Knee, 34 degrees; low pressure, 15 mins, Pain and Edema                                    11/14/23  EXERCISE LOG  Exercise Repetitions and Resistance Comments  Nustep  L4 x 15 minutes; seat 6-5   Rocker board  3.5 minutes   Lunges onto step  14" step x 2 minutes  LLE on step for knee flexion  Marching on foam  2.5 minutes   Semi-tandem on foam  2 x 1 minutes each         Blank cell = exercise not performed today  Modalities: no redness or adverse reaction to today's modalities  Date:  Unattended Estim: left hip adductors and iliotibial band, IFC @ 80-150 Hz w/ 40% scan , 15 mins, Pain and Edema Vaso: Knee, 34 degrees; low pressure, 15 mins, Pain and Edema  PATIENT EDUCATION:  Education details: healing and goals for physical therapy Person educated: Patient Education method: Explanation Education comprehension: verbalized understanding  HOME EXERCISE PROGRAM:   ASSESSMENT:  CLINICAL IMPRESSION: Today's treatment focused on familiar interventions for improved lower extremity strength and power with moderate difficulty. She required minimal cueing with today's interventions for proper exercise performance. She  She reported that her knee felt better upon the conclusion of treatment. She continues to require skilled physical therapy to address her remaining impairments to return to her prior level of function.   OBJECTIVE IMPAIRMENTS: Abnormal gait, decreased activity tolerance, decreased balance, decreased mobility, difficulty walking, decreased ROM, decreased strength, hypomobility, increased edema, impaired flexibility, impaired sensation, impaired tone, and pain.   ACTIVITY LIMITATIONS: carrying, lifting, standing, squatting, sleeping,  stairs, transfers, dressing, locomotion level, and caring for others  PARTICIPATION LIMITATIONS: meal prep, cleaning, laundry, driving, shopping, and community activity  PERSONAL FACTORS: Past/current experiences, Transportation, and 3+ comorbidities: Neuropathy, Paget disease, arthritis, anxiety, depression, chronic pain, and history of COVID-19  are also affecting patient's functional outcome.   REHAB POTENTIAL: Good  CLINICAL DECISION MAKING: Evolving/moderate complexity  EVALUATION COMPLEXITY: Moderate   GOALS: Goals reviewed with patient? Yes  LONG TERM GOALS: Target date: 11/15/23  Patient will be independent with her HEP.  Baseline:  Goal status: Partially met.  2.  Patient will improve her active left knee extension within 5 degrees of neutral for improved gait mechanics.  Baseline:  Goal status: In progress.  3.  Patient will improve her right knee flexion to at least 120 degrees for improved function navigating stairs.  Baseline:  Goal status: 11/18/23:  105A/110P  4.  Patient will be able to safely navigate with a cane or the least restrictive assistive device for improved household mobility.  Baseline:  Goal status:In progress.  5.  Patient will be able to navigate at least 3 steps with a reciprocal pattern for improved household mobility.  Baseline:  Goal status: In progress.  PLAN:  PT FREQUENCY: 2-3x/week  PT DURATION: 4 weeks  PLANNED INTERVENTIONS: 97164- PT Re-evaluation, 97110-Therapeutic exercises, 97530- Therapeutic activity, 97112- Neuromuscular re-education, 97535- Self Care, 96295- Manual therapy, 97116- Gait training, 97014- Electrical stimulation (unattended), 97016- Vasopneumatic device, Patient/Family education, Balance training, Stair training, Joint mobilization, Cryotherapy, and Moist heat  PLAN FOR NEXT SESSION: Nustep, quad sets, heel slides, PROM, and modalities as needed  Granville Lewis, PT 11/28/2023, 3:09 PM

## 2023-12-03 ENCOUNTER — Ambulatory Visit: Payer: Medicare HMO | Attending: Orthopedic Surgery

## 2023-12-03 DIAGNOSIS — M25562 Pain in left knee: Secondary | ICD-10-CM | POA: Diagnosis present

## 2023-12-03 DIAGNOSIS — R600 Localized edema: Secondary | ICD-10-CM | POA: Diagnosis present

## 2023-12-03 DIAGNOSIS — M25662 Stiffness of left knee, not elsewhere classified: Secondary | ICD-10-CM

## 2023-12-03 NOTE — Therapy (Signed)
 OUTPATIENT PHYSICAL THERAPY LOWER EXTREMITY   Patient Name: Julia [name] MRN: [id number] DOB:27-Feb-1961, 63 y.o., female Today's Date: 12/03/2023  END OF SESSION:  PT End of Session - 12/03/23 1455     Visit Number 14    Number of Visits 16    Date for PT Re-Evaluation 12/13/23    PT Start Time 1452    PT Stop Time 1555    PT Time Calculation (min) 63 min    Activity Tolerance Patient tolerated treatment well    Behavior During Therapy [facility] for tasks assessed/performed                 Past Medical History:  Diagnosis Date   Anxiety    Arthritis    Complication of anesthesia    slow to wake up-does not take much medicine to sedate her   Depression    Esophageal reflux    Hyperlipidemia    Low ferritin 02/10/2021   Paget disease of bone    Pneumonia due to COVID-19 virus 05/20/2020   Vitamin B12 deficiency 02/10/2021   Vitamin D insufficiency 02/10/2021   Wears glasses    Past Surgical History:  Procedure Laterality Date   ANTERIOR FUSION CERVICAL SPINE  10/2019   C3-C5   BREAST CYST EXCISION Left    CERVICAL SPINE SURGERY  10/2011   CESAREAN SECTION     DILATION AND CURETTAGE OF UTERUS  1986   KNEE ARTHROSCOPY Left 07/22/2013   Procedure: LEFT KNEE ARTHROSCOPY WITH DEBRIDEMENT CHONDROPLASTY, REMOVAL LOOSE BODIES, PARTIAL MEDIAL MENISCECTOMY, OPEN EXCISION OF PES GANGLION;  Surgeon: [name], MD;  Location: [facility];  Service: Orthopedics;  Laterality: Left;  NO SPECIMEN SENT PER DR. [name] ORDER.   KNEE SURGERY Left 11/21/2021   REPLACEMENT TOTAL KNEE Left 10/15/2023   SPINE SURGERY  10/26/2019   TOTAL ABDOMINAL HYSTERECTOMY  05/2010   TAH   TUBAL LIGATION  1989   Patient Active Problem List   Diagnosis Date Noted   History of left knee replacement 11/06/2023   MDD (major depressive disorder), recurrent episode (HCC) 08/02/2022   Osteoarthritis of left knee 06/23/2022   Osteoarthritis of right acromioclavicular joint 12/07/2021    Arthritis 02/12/2021   Prediabetes 02/12/2021   Mixed stress and urge urinary incontinence 02/12/2021   Obesity (BMI 30.0-34.9) 02/12/2021   Low ferritin 02/10/2021   Vitamin D insufficiency 02/10/2021   Vitamin B12 deficiency 02/10/2021   COVID-19 long hauler 10/13/2020   Neuropathy 08/22/2020   Chronic pain 02/10/2016   Paget disease of bone 12/28/2015   Esophageal reflux 08/15/2015   Mixed hyperlipidemia 08/15/2015   Chronic pain in shoulder 08/15/2015   Anxiety and depression 11/25/2013   GAD (generalized anxiety disorder) 11/25/2013   IBS (irritable bowel syndrome) 01/02/2013    PCP: Sonny Masters, FNP  REFERRING PROVIDER: Ollen Gross, MD   REFERRING DIAG: Osteoarthritis of left knee joint  THERAPY DIAG:  Acute pain of left knee  Stiffness of left knee, not elsewhere classified  Localized edema  Rationale for Evaluation and Treatment: Rehabilitation  ONSET DATE: 10/15/23  SUBJECTIVE:   SUBJECTIVE STATEMENT: Patient reports that she feels alright today.  PERTINENT HISTORY: Neuropathy, Paget disease, arthritis, anxiety, depression, chronic pain, and history of COVID-19 PAIN:  Are you having pain? Yes: NPRS scale: 5/10 Pain location: left thigh and knee Pain description: constant aching and throbbing with intermittent soreness Aggravating factors: getting into and out of bed Relieving factors: laying down, ice, and medication  PRECAUTIONS:  Fall (due to poor vision)   RED FLAGS: None   WEIGHT BEARING RESTRICTIONS: No  FALLS:  Has patient fallen in last 6 months? No  LIVING ENVIRONMENT: Lives with: lives with an adult companion Lives in: House/apartment Stairs: Yes: External: 3 steps; none Has following equipment at home: Walker - 2 wheeled  OCCUPATION: disabled  PLOF: Independent  PATIENT GOALS: reduced pain, improved, be able to watch her granddaughter, and improved sleep  NEXT MD VISIT: 10/29/23  OBJECTIVE:  Note: Objective measures were  completed at Evaluation unless otherwise noted.  PATIENT SURVEYS:  FOTO 21.03  COGNITION: Overall cognitive status: Within functional limits for tasks assessed     SENSATION: Patient reports left lateral knee numbness since surgery.   EDEMA:  Circumferential: L tibiofemoral joint line: 46.5 cm R tibiofemoral joint line: 39.0 cm  PALPATION: TTP: right quadriceps, IT band, and lateral knee  LOWER EXTREMITY ROM:  Active ROM Right eval Left eval Left 10/28/23 Left 11/18/23  Hip flexion      Hip extension      Hip abduction      Hip adduction      Hip internal rotation      Hip external rotation      Knee flexion 140 64/ 38 (PROM)  100 105A/110P  Knee extension 0 21    Ankle dorsiflexion      Ankle plantarflexion      Ankle inversion      Ankle eversion       (Blank rows = not tested)  LOWER EXTREMITY MMT: not tested due to surgical condition  LOWER EXTREMITY SPECIAL TESTS:  Not tested due to surgical condition  GAIT: Assistive device utilized: Environmental consultant - 2 wheeled Level of assistance: Modified independence Comments: decreased gait speed and stride length with left knee flexed in stance phase                                                                                      TREATMENT DATE:                                     12/03/23 EXERCISE LOG  Exercise Repetitions and Resistance Comments  Nustep  L4 x 5 minutes; seat 7-6   Recumbent bike  L4 x 15 minutes; seat 3-2   Rocker board  5 minutes   Standing HS stretch  3 x 30 seconds  LLE only   Cybex knee flexion  40# x 2.5 minutes   Cybex knee extension 20# x 2.5 minutes   Cybex leg press 1.5 plates @ seat 4 x 3 minutes    Blank cell = exercise not performed today  Modalities: no redness or adverse reaction to today's modalities  Date:  Unattended Estim: Knee, IFC @ 80-150 Hz w/ 40% scan, 15 mins, Pain and Edema Vaso: Knee, 34 degrees; low pressure, 15 mins, Pain and Edema  11/28/23 EXERCISE LOG  Exercise Repetitions and Resistance Comments  Nustep  L4 x 5 minutes; seat 5   Recumbent bike  L4 x 15 minutes; seat 3-2   Cybex knee flexion 40# x 2.5 minutes   Cybex knee extension  20# x 2.5 minutes   Leg press  1.5 plates x 2.5 minutes @ seat 5   Rocker board  4 minutes    Blank cell = exercise not performed today  Modalities: no redness or adverse reaction to today's modalities  Date:  Unattended Estim: left hamstring, pre mod @ 80-150 Hz, 15 mins, Pain and Tone Vaso: Knee, 34 degrees; low pressure, 15 mins, Pain and Tone  11/26/23: EXERCISE LOG  Exercise Repetitions and Resistance Comments  Nustep  Level 4 x 5 minutes; seat 5   Recumbent bike  Seat 2 x 15 minutes; seat 2   Knee ext 20# x 2.5 minutes   Ham curls 40# x 2.5 minutes.   Leg Press 1 Plate; seat 5; x 2.5 mins   Rockerboard 4 mins    Modalities  Date:  Vaso: Knee, 34 degrees; low pressure, 15 mins, Pain and Edema   PATIENT EDUCATION:  Education details: healing and goals for physical therapy Person educated: Patient Education method: Explanation Education comprehension: verbalized understanding  HOME EXERCISE PROGRAM:   ASSESSMENT:  CLINICAL IMPRESSION: Patient was introduced to a standing hamstring stretch for improved hamstring soft tissue extensibility to promote knee extension. She required minimal cueing with the standing hamstring stretch for proper exercise performance. She experienced no significant increase in pain or discomfort with any of today's interventions. She reported that her knee felt better upon the conclusion of treatment. She continues to require skilled physical therapy to address her remaining impairments to return to her prior level of function.   OBJECTIVE IMPAIRMENTS: Abnormal gait, decreased activity tolerance, decreased balance, decreased mobility, difficulty walking, decreased ROM, decreased strength, hypomobility, increased edema, impaired flexibility,  impaired sensation, impaired tone, and pain.   ACTIVITY LIMITATIONS: carrying, lifting, standing, squatting, sleeping, stairs, transfers, dressing, locomotion level, and caring for others  PARTICIPATION LIMITATIONS: meal prep, cleaning, laundry, driving, shopping, and community activity  PERSONAL FACTORS: Past/current experiences, Transportation, and 3+ comorbidities: Neuropathy, Paget disease, arthritis, anxiety, depression, chronic pain, and history of COVID-19  are also affecting patient's functional outcome.   REHAB POTENTIAL: Good  CLINICAL DECISION MAKING: Evolving/moderate complexity  EVALUATION COMPLEXITY: Moderate   GOALS: Goals reviewed with patient? Yes  LONG TERM GOALS: Target date: 11/15/23  Patient will be independent with her HEP.  Baseline:  Goal status: Partially met.  2.  Patient will improve her active left knee extension within 5 degrees of neutral for improved gait mechanics.  Baseline:  Goal status: In progress.  3.  Patient will improve her right knee flexion to at least 120 degrees for improved function navigating stairs.  Baseline:  Goal status: 11/18/23:  105A/110P  4.  Patient will be able to safely navigate with a cane or the least restrictive assistive device for improved household mobility.  Baseline:  Goal status:In progress.  5.  Patient will be able to navigate at least 3 steps with a reciprocal pattern for improved household mobility.  Baseline:  Goal status: In progress.  PLAN:  PT FREQUENCY: 2-3x/week  PT DURATION: 4 weeks  PLANNED INTERVENTIONS: 97164- PT Re-evaluation, 97110-Therapeutic exercises, 97530- Therapeutic activity, 97112- Neuromuscular re-education, 97535- Self Care, 16109- Manual therapy, [id number]- Gait training, 97014- Electrical stimulation (unattended), [phone]- Vasopneumatic device, Patient/Family education, Balance training, Stair  training, Joint mobilization, Cryotherapy, and Moist heat  PLAN FOR NEXT SESSION: Nustep,  quad sets, heel slides, PROM, and modalities as needed   Granville Lewis, PT 12/03/2023, 3:58 PM

## 2023-12-04 ENCOUNTER — Other Ambulatory Visit: Payer: Self-pay | Admitting: Family Medicine

## 2023-12-04 ENCOUNTER — Ambulatory Visit (HOSPITAL_COMMUNITY): Payer: Medicare HMO | Admitting: Clinical

## 2023-12-04 DIAGNOSIS — M1990 Unspecified osteoarthritis, unspecified site: Secondary | ICD-10-CM

## 2023-12-05 ENCOUNTER — Ambulatory Visit: Payer: Medicare HMO | Admitting: Physical Therapy

## 2023-12-05 DIAGNOSIS — M25562 Pain in left knee: Secondary | ICD-10-CM

## 2023-12-05 DIAGNOSIS — M25662 Stiffness of left knee, not elsewhere classified: Secondary | ICD-10-CM

## 2023-12-05 DIAGNOSIS — R600 Localized edema: Secondary | ICD-10-CM

## 2023-12-05 NOTE — Therapy (Signed)
 OUTPATIENT PHYSICAL THERAPY LOWER EXTREMITY  TREATMENT  Patient Name: Julia [name] MRN: [id number] DOB:[birth year], 63 y.o., female Today's Date: 12/05/2023  END OF SESSION:  PT End of Session - 12/05/23 1431     Visit Number 15    Number of Visits 16    Date for PT Re-Evaluation 12/13/23    PT Start Time 0230    PT Stop Time 0320    PT Time Calculation (min) 50 min    Activity Tolerance Patient tolerated treatment well    Behavior During Therapy [facility] for tasks assessed/performed                 Past Medical History:  Diagnosis Date   Anxiety    Arthritis    Complication of anesthesia    slow to wake up-does not take much medicine to sedate her   Depression    Esophageal reflux    Hyperlipidemia    Low ferritin 02/10/2021   Paget disease of bone    Pneumonia due to COVID-19 virus 05/20/2020   Vitamin B12 deficiency 02/10/2021   Vitamin D insufficiency 02/10/2021   Wears glasses    Past Surgical History:  Procedure Laterality Date   ANTERIOR FUSION CERVICAL SPINE  10/2019   C3-C5   BREAST CYST EXCISION Left    CERVICAL SPINE SURGERY  10/2011   CESAREAN SECTION     DILATION AND CURETTAGE OF UTERUS  1986   KNEE ARTHROSCOPY Left 07/22/2013   Procedure: LEFT KNEE ARTHROSCOPY WITH DEBRIDEMENT CHONDROPLASTY, REMOVAL LOOSE BODIES, PARTIAL MEDIAL MENISCECTOMY, OPEN EXCISION OF PES GANGLION;  Surgeon: [name], MD;  Location: [facility];  Service: Orthopedics;  Laterality: Left;  NO SPECIMEN SENT PER DR. [name] ORDER.   KNEE SURGERY Left 11/21/2021   REPLACEMENT TOTAL KNEE Left 10/15/2023   SPINE SURGERY  10/26/2019   TOTAL ABDOMINAL HYSTERECTOMY  05/2010   TAH   TUBAL LIGATION  1989   Patient Active Problem List   Diagnosis Date Noted   History of left knee replacement 11/06/2023   MDD (major depressive disorder), recurrent episode (HCC) 08/02/2022   Osteoarthritis of left knee 06/23/2022   Osteoarthritis of right acromioclavicular joint  12/07/2021   Arthritis 02/12/2021   Prediabetes 02/12/2021   Mixed stress and urge urinary incontinence 02/12/2021   Obesity (BMI 30.0-34.9) 02/12/2021   Low ferritin 02/10/2021   Vitamin D insufficiency 02/10/2021   Vitamin B12 deficiency 02/10/2021   COVID-19 long hauler 10/13/2020   Neuropathy 08/22/2020   Chronic pain 02/10/2016   Paget disease of bone 12/28/2015   Esophageal reflux 08/15/2015   Mixed hyperlipidemia 08/15/2015   Chronic pain in shoulder 08/15/2015   Anxiety and depression 11/25/2013   GAD (generalized anxiety disorder) 11/25/2013   IBS (irritable bowel syndrome) 01/02/2013    PCP: Sonny Masters, FNP  REFERRING PROVIDER: Ollen Gross, MD   REFERRING DIAG: Osteoarthritis of left knee joint  THERAPY DIAG:  Acute pain of left knee  Stiffness of left knee, not elsewhere classified  Localized edema  Rationale for Evaluation and Treatment: Rehabilitation  ONSET DATE: 10/15/23  SUBJECTIVE:   SUBJECTIVE STATEMENT: Now new complaints.  PERTINENT HISTORY: Neuropathy, Paget disease, arthritis, anxiety, depression, chronic pain, and history of COVID-19 PAIN:  Are you having pain? Yes: NPRS scale: 5/10 Pain location: left thigh and knee Pain description: constant aching and throbbing with intermittent soreness Aggravating factors: getting into and out of bed Relieving factors: laying down, ice, and medication  PRECAUTIONS: Fall (due to  poor vision)   RED FLAGS: None   WEIGHT BEARING RESTRICTIONS: No  FALLS:  Has patient fallen in last 6 months? No  LIVING ENVIRONMENT: Lives with: lives with an adult companion Lives in: House/apartment Stairs: Yes: External: 3 steps; none Has following equipment at home: Walker - 2 wheeled  OCCUPATION: disabled  PLOF: Independent  PATIENT GOALS: reduced pain, improved, be able to watch her granddaughter, and improved sleep  NEXT MD VISIT: 10/29/23  OBJECTIVE:  Note: Objective measures were completed  at Evaluation unless otherwise noted.  PATIENT SURVEYS:  FOTO 21.03  COGNITION: Overall cognitive status: Within functional limits for tasks assessed     SENSATION: Patient reports left lateral knee numbness since surgery.   EDEMA:  Circumferential: L tibiofemoral joint line: 46.5 cm R tibiofemoral joint line: 39.0 cm  PALPATION: TTP: right quadriceps, IT band, and lateral knee  LOWER EXTREMITY ROM:  Active ROM Right eval Left eval Left 10/28/23 Left 11/18/23  Hip flexion      Hip extension      Hip abduction      Hip adduction      Hip internal rotation      Hip external rotation      Knee flexion 140 64/ 38 (PROM)  100 105A/110P  Knee extension 0 21    Ankle dorsiflexion      Ankle plantarflexion      Ankle inversion      Ankle eversion       (Blank rows = not tested)  LOWER EXTREMITY MMT: not tested due to surgical condition  LOWER EXTREMITY SPECIAL TESTS:  Not tested due to surgical condition  GAIT: Assistive device utilized: Environmental consultant - 2 wheeled Level of assistance: Modified independence Comments: decreased gait speed and stride length with left knee flexed in stance phase                                                                                      TREATMENT DATE:    12/05/23:                                   EXERCISE LOG  Exercise Repetitions and Resistance Comments  Nustep Level 3 x 5 minutes   Recumbent bike Level 3 x 15 minutes   Knee ext 10# x 3 minutes   Ham curls 40# x 3 minutes   Leg press 2 plates x 3 minutes   Unattended Estim: Knee, IFC @ 80-150 Hz w/ 40% scan, 15 mins, Pain and Edema Vaso: Knee, 34 degrees; low pressure, 15 mins, Pain and Edema                                   12/03/23 EXERCISE LOG  Exercise Repetitions and Resistance Comments  Nustep  L4 x 5 minutes; seat 7-6   Recumbent bike  L4 x 15 minutes; seat 3-2   Rocker board  5 minutes   Standing HS stretch  3 x 30 seconds  LLE only   Cybex knee flexion  40# x 2.5  minutes   Cybex knee extension 20# x 2.5 minutes   Cybex leg press 1.5 plates @ seat 4 x 3 minutes    Blank cell = exercise not performed today  Modalities: no redness or adverse reaction to today's modalities  Date:  Unattended Estim: Knee, IFC @ 80-150 Hz w/ 40% scan, 15 mins, Pain and Edema Vaso: Knee, 34 degrees; low pressure, 15 mins, Pain and Edema                       PATIENT EDUCATION:  Education details: healing and goals for physical therapy Person educated: Patient Education method: Explanation Education comprehension: verbalized understanding  HOME EXERCISE PROGRAM:   ASSESSMENT:  CLINICAL IMPRESSION: Patient is highly motivated and progressed to seat 1 on the recumbent bike today.    OBJECTIVE IMPAIRMENTS: Abnormal gait, decreased activity tolerance, decreased balance, decreased mobility, difficulty walking, decreased ROM, decreased strength, hypomobility, increased edema, impaired flexibility, impaired sensation, impaired tone, and pain.   ACTIVITY LIMITATIONS: carrying, lifting, standing, squatting, sleeping, stairs, transfers, dressing, locomotion level, and caring for others  PARTICIPATION LIMITATIONS: meal prep, cleaning, laundry, driving, shopping, and community activity  PERSONAL FACTORS: Past/current experiences, Transportation, and 3+ comorbidities: Neuropathy, Paget disease, arthritis, anxiety, depression, chronic pain, and history of COVID-19  are also affecting patient's functional outcome.   REHAB POTENTIAL: Good  CLINICAL DECISION MAKING: Evolving/moderate complexity  EVALUATION COMPLEXITY: Moderate   GOALS: Goals reviewed with patient? Yes  LONG TERM GOALS: Target date: 11/15/23  Patient will be independent with her HEP.  Baseline:  Goal status: Partially met.  2.  Patient will improve her active left knee extension within 5 degrees of neutral for improved gait mechanics.  Baseline:  Goal status: In progress.  3.  Patient will improve  her right knee flexion to at least 120 degrees for improved function navigating stairs.  Baseline:  Goal status: 11/18/23:  105A/110P  4.  Patient will be able to safely navigate with a cane or the least restrictive assistive device for improved household mobility.  Baseline:  Goal status:In progress.  5.  Patient will be able to navigate at least 3 steps with a reciprocal pattern for improved household mobility.  Baseline:  Goal status: In progress.  PLAN:  PT FREQUENCY: 2-3x/week  PT DURATION: 4 weeks  PLANNED INTERVENTIONS: 97164- PT Re-evaluation, 97110-Therapeutic exercises, 97530- Therapeutic activity, 97112- Neuromuscular re-education, 97535- Self Care, 81191- Manual therapy, 97116- Gait training, 97014- Electrical stimulation (unattended), 97016- Vasopneumatic device, Patient/Family education, Balance training, Stair training, Joint mobilization, Cryotherapy, and Moist heat  PLAN FOR NEXT SESSION: Nustep, quad sets, heel slides, PROM, and modalities as needed   Caila Cirelli, [place], PT 12/05/2023, 3:22 PM
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
## 2023-12-10 ENCOUNTER — Ambulatory Visit

## 2023-12-10 DIAGNOSIS — M25662 Stiffness of left knee, not elsewhere classified: Secondary | ICD-10-CM

## 2023-12-10 DIAGNOSIS — M25562 Pain in left knee: Secondary | ICD-10-CM

## 2023-12-10 DIAGNOSIS — R600 Localized edema: Secondary | ICD-10-CM

## 2023-12-10 NOTE — Therapy (Addendum)
 OUTPATIENT PHYSICAL THERAPY LOWER EXTREMITY  TREATMENT  Patient Name: Julia [name] MRN: [id number] DOB:1961-07-17, 63 y.o., female Today's Date: 12/10/2023  END OF SESSION:  PT End of Session - 12/10/23 1412     Visit Number 16    Number of Visits 16    Date for PT Re-Evaluation 12/13/23    PT Start Time 1410    PT Stop Time 1506    PT Time Calculation (min) 56 min    Activity Tolerance Patient tolerated treatment well    Behavior During Therapy [facility] for tasks assessed/performed                 Past Medical History:  Diagnosis Date   Anxiety    Arthritis    Complication of anesthesia    slow to wake up-does not take much medicine to sedate her   Depression    Esophageal reflux    Hyperlipidemia    Low ferritin 02/10/2021   Paget disease of bone    Pneumonia due to COVID-19 virus 05/20/2020   Vitamin B12 deficiency 02/10/2021   Vitamin D insufficiency 02/10/2021   Wears glasses    Past Surgical History:  Procedure Laterality Date   ANTERIOR FUSION CERVICAL SPINE  10/2019   C3-C5   BREAST CYST EXCISION Left    CERVICAL SPINE SURGERY  10/2011   CESAREAN SECTION     DILATION AND CURETTAGE OF UTERUS  1986   KNEE ARTHROSCOPY Left 07/22/2013   Procedure: LEFT KNEE ARTHROSCOPY WITH DEBRIDEMENT CHONDROPLASTY, REMOVAL LOOSE BODIES, PARTIAL MEDIAL MENISCECTOMY, OPEN EXCISION OF PES GANGLION;  Surgeon: [name], MD;  Location: [facility];  Service: Orthopedics;  Laterality: Left;  NO SPECIMEN SENT PER DR. [name] ORDER.   KNEE SURGERY Left 11/21/2021   REPLACEMENT TOTAL KNEE Left 10/15/2023   SPINE SURGERY  10/26/2019   TOTAL ABDOMINAL HYSTERECTOMY  05/2010   TAH   TUBAL LIGATION  1989   Patient Active Problem List   Diagnosis Date Noted   History of left knee replacement 11/06/2023   MDD (major depressive disorder), recurrent episode (HCC) 08/02/2022   Osteoarthritis of left knee 06/23/2022   Osteoarthritis of right acromioclavicular joint  12/07/2021   Arthritis 02/12/2021   Prediabetes 02/12/2021   Mixed stress and urge urinary incontinence 02/12/2021   Obesity (BMI 30.0-34.9) 02/12/2021   Low ferritin 02/10/2021   Vitamin D insufficiency 02/10/2021   Vitamin B12 deficiency 02/10/2021   COVID-19 long hauler 10/13/2020   Neuropathy 08/22/2020   Chronic pain 02/10/2016   Paget disease of bone 12/28/2015   Esophageal reflux 08/15/2015   Mixed hyperlipidemia 08/15/2015   Chronic pain in shoulder 08/15/2015   Anxiety and depression 11/25/2013   GAD (generalized anxiety disorder) 11/25/2013   IBS (irritable bowel syndrome) 01/02/2013    PCP: Sonny Masters, FNP  REFERRING PROVIDER: Ollen Gross, MD   REFERRING DIAG: Osteoarthritis of left knee joint  THERAPY DIAG:  Acute pain of left knee  Stiffness of left knee, not elsewhere classified  Localized edema  Rationale for Evaluation and Treatment: Rehabilitation  ONSET DATE: 10/15/23  SUBJECTIVE:   SUBJECTIVE STATEMENT: Pt reports 2/10 left knee pain.  Pt ready for discharge today.   PERTINENT HISTORY: Neuropathy, Paget disease, arthritis, anxiety, depression, chronic pain, and history of COVID-19 PAIN:  Are you having pain? Yes: NPRS scale: 2/10 Pain location: left thigh and knee Pain description: constant aching and throbbing with intermittent soreness Aggravating factors: getting into and out of bed Relieving factors:  laying down, ice, and medication  PRECAUTIONS: Fall (due to poor vision)   RED FLAGS: None   WEIGHT BEARING RESTRICTIONS: No  FALLS:  Has patient fallen in last 6 months? No  LIVING ENVIRONMENT: Lives with: lives with an adult companion Lives in: House/apartment Stairs: Yes: External: 3 steps; none Has following equipment at home: Walker - 2 wheeled  OCCUPATION: disabled  PLOF: Independent  PATIENT GOALS: reduced pain, improved, be able to watch her granddaughter, and improved sleep  NEXT MD VISIT: 10/29/23  OBJECTIVE:   Note: Objective measures were completed at Evaluation unless otherwise noted.  PATIENT SURVEYS:  FOTO 21.03  COGNITION: Overall cognitive status: Within functional limits for tasks assessed     SENSATION: Patient reports left lateral knee numbness since surgery.   EDEMA:  Circumferential: L tibiofemoral joint line: 46.5 cm R tibiofemoral joint line: 39.0 cm  PALPATION: TTP: right quadriceps, IT band, and lateral knee  LOWER EXTREMITY ROM:  Active ROM Right eval Left eval Left 10/28/23 Left 11/18/23  Hip flexion      Hip extension      Hip abduction      Hip adduction      Hip internal rotation      Hip external rotation      Knee flexion 140 64/ 38 (PROM)  100 105A/110P  Knee extension 0 21    Ankle dorsiflexion      Ankle plantarflexion      Ankle inversion      Ankle eversion       (Blank rows = not tested)  LOWER EXTREMITY MMT: not tested due to surgical condition  LOWER EXTREMITY SPECIAL TESTS:  Not tested due to surgical condition  GAIT: Assistive device utilized: Environmental consultant - 2 wheeled Level of assistance: Modified independence Comments: decreased gait speed and stride length with left knee flexed in stance phase                                                                                      TREATMENT DATE:    12/10/23:                                   EXERCISE LOG  Exercise Repetitions and Resistance Comments  Rockerboard 3 mins   Recumbent bike Level 3 x 16 minutes; seat 4-1   Knee ext 10# x 3.5 minutes   Ham curls 40# x 3.5 minutes   Leg press 2 plates x 3.5 minutes   Unattended Estim: Knee, IFC @ 80-150 Hz w/ 40% scan, 15 mins, Pain and Edema Vaso: Knee, 34 degrees; low pressure, 15 mins, Pain and Edema                                   12/03/23 EXERCISE LOG  Exercise Repetitions and Resistance Comments  Nustep  L4 x 5 minutes; seat 7-6   Recumbent bike  L4 x 15 minutes; seat 3-2   Rocker board  5 minutes   Standing HS stretch  3 x 30  seconds  LLE only   Cybex knee flexion  40# x 2.5 minutes   Cybex knee extension 20# x 2.5 minutes   Cybex leg press 1.5 plates @ seat 4 x 3 minutes    Blank cell = exercise not performed today  Modalities: no redness or adverse reaction to today's modalities  Date:  Unattended Estim: Knee, IFC @ 80-150 Hz w/ 40% scan, 15 mins, Pain and Edema Vaso: Knee, 34 degrees; low pressure, 15 mins, Pain and Edema                       PATIENT EDUCATION:  Education details: healing and goals for physical therapy Person educated: Patient Education method: Explanation Education comprehension: verbalized understanding  HOME EXERCISE PROGRAM:   ASSESSMENT:  CLINICAL IMPRESSION: Pt arrives for today's treatment session reporting 2/10 left knee pain.  Pt able to navigate four stairs with reciprocal pattern and use of one hand rail, meeting her LTG.  Pt also able to demonstrate -3 degrees of left knee extension and 121 degrees of left knee flexion, meeting both of her ROM goals.  Pt able to tolerate increased time with all cybex exercises today without issue.  Normal responses to estim and vaso noted upon removal.  Pt reported 1/10 left knee pain at completion of today's treatment session.   PHYSICAL THERAPY DISCHARGE SUMMARY  Visits from Start of Care: 16  Current functional level related to goals / functional outcomes: Patient was able to meet all of her goals for skilled physical therapy.    Remaining deficits: None    Education / Equipment: HEP   Patient agrees to discharge. Patient goals were met. Patient is being discharged due to meeting the stated rehab goals.  Candi Leash, PT, DPT    OBJECTIVE IMPAIRMENTS: Abnormal gait, decreased activity tolerance, decreased balance, decreased mobility, difficulty walking, decreased ROM, decreased strength, hypomobility, increased edema, impaired flexibility, impaired sensation, impaired tone, and pain.   ACTIVITY LIMITATIONS: carrying,  lifting, standing, squatting, sleeping, stairs, transfers, dressing, locomotion level, and caring for others  PARTICIPATION LIMITATIONS: meal prep, cleaning, laundry, driving, shopping, and community activity  PERSONAL FACTORS: Past/current experiences, Transportation, and 3+ comorbidities: Neuropathy, Paget disease, arthritis, anxiety, depression, chronic pain, and history of COVID-19  are also affecting patient's functional outcome.   REHAB POTENTIAL: Good  CLINICAL DECISION MAKING: Evolving/moderate complexity  EVALUATION COMPLEXITY: Moderate   GOALS: Goals reviewed with patient? Yes  LONG TERM GOALS: Target date: 11/15/23  Patient will be independent with her HEP.  Baseline:  Goal status: MET  2.  Patient will improve her active left knee extension within 5 degrees of neutral for improved gait mechanics.  Baseline: 3 degrees from neutral Goal status: MET  3.  Patient will improve her right knee flexion to at least 120 degrees for improved function navigating stairs.  Baseline:11/18/23:  105A/110P; 121 degrees on 12/10/23 Goal status: MET  4.  Patient will be able to safely navigate with a cane or the least restrictive assistive device for improved household mobility.  Baseline:  Goal status: MET  5.  Patient will be able to navigate at least 3 steps with a reciprocal pattern for improved household mobility.  Baseline:  Goal status: MET  PLAN:  PT FREQUENCY: 2-3x/week  PT DURATION: 4 weeks  PLANNED INTERVENTIONS: 97164- PT Re-evaluation, 97110-Therapeutic exercises, 97530- Therapeutic activity, 97112- Neuromuscular re-education, 97535- Self Care, 08657- Manual therapy, [id number]- Gait training, 97014- Electrical stimulation (unattended), 97016- Vasopneumatic device, Patient/Family education, Balance training,  Stair training, Joint mobilization, Cryotherapy, and Moist heat  PLAN FOR NEXT SESSION: Nustep, quad sets, heel slides, PROM, and modalities as needed   Newman Pies, PTA 12/10/2023, 3:10 PM

## 2023-12-13 ENCOUNTER — Telehealth (HOSPITAL_COMMUNITY): Payer: Medicare HMO | Admitting: Psychiatry

## 2023-12-13 ENCOUNTER — Encounter (HOSPITAL_COMMUNITY): Payer: Self-pay | Admitting: Psychiatry

## 2023-12-13 DIAGNOSIS — F411 Generalized anxiety disorder: Secondary | ICD-10-CM | POA: Diagnosis not present

## 2023-12-13 DIAGNOSIS — F331 Major depressive disorder, recurrent, moderate: Secondary | ICD-10-CM

## 2023-12-13 MED ORDER — DULOXETINE HCL 60 MG PO CPEP: 60.0000 mg | ORAL_CAPSULE | Freq: Two times a day (BID) | ORAL | 0 refills | Status: DC

## 2023-12-19 ENCOUNTER — Encounter (HOSPITAL_COMMUNITY): Payer: Self-pay | Admitting: Clinical

## 2023-12-19 ENCOUNTER — Ambulatory Visit (HOSPITAL_COMMUNITY): Payer: Medicare HMO | Admitting: Clinical

## 2023-12-19 DIAGNOSIS — F331 Major depressive disorder, recurrent, moderate: Secondary | ICD-10-CM | POA: Diagnosis not present

## 2023-12-19 DIAGNOSIS — F411 Generalized anxiety disorder: Secondary | ICD-10-CM

## 2023-12-19 NOTE — Progress Notes (Signed)
 THERAPIST PROGRESS NOTE  Session Time: 2:08-3:05pm   Session #17  Participation Level: Active  Behavioral Response: Casual Alert Euthymic and in pain  Type of Therapy: Individual Therapy  Goals addressed:  Goal: LTG: Phil will score less than 5 on the Generalized Anxiety Disorder 7 Scale (GAD-7) Goal: STG: Julia [name] will reduce frequency of avoidant behaviors by 50% as evidenced by self-report in therapy sessions Goal: LTG: Learn breathing techniques and grounding techniques at an age-appropriate level and demonstrate mastery in session then report independent use of these skills out of session. Goal: STG: Learn emotion regulation strategies and practice using them Goal: LTG: Reduce frequency, intensity, and duration of depression symptoms so that daily functioning is improved Goal: STG: Julia [name] will score less than 9 on the Patient Health Questionnaire (PHQ-9) Goal: LTG: Identify and decrease cognitive distortions contributing negatively to mood and behavior by identifying 5-7 cognitive distortions Julia [name] has and learning how to come up with replacement thoughts that are more balanced, realistic, and helpful. Goal: STG: Learn a variety of coping skills and demonstrate the ability to use them to decrease feelings of sadness, anger, and fear and increase feelings of happiness, peace, and powerfulness AEB gauging those emotions on 1-10 scale. Goal: Julia [name] will increase her resilience through application of CBT techniques and through processing of her life in a shame framework Goal: Julia [name] will learn about additional communication techniques, practice these with her partner and family, and report improved communication within these relationships. Goal: LTG: Learn about boundary types, how to implement them, and how to enforce them so that feels more empowered and content with being able to maintain more helpful, appropriate boundaries in the future for a more balanced result.  ProgressTowards Goals:  Progressing  Interventions: Assertiveness Training and Supportive   Summary: Julia [name] is a 63 y.o. female who presents with anxiety and depression for therapy.  She presented oriented x5 and stated she was feeling "in pain, hurting."  CSW evaluated patient's medication compliance, use of coping tools, and self-care, as applicable.  She provided an update on various aspects of her life that are normally discussed in therapy, including her surgery and Physical Therapy update, the work she has done around the house since fiance does not help and is out of town, and her birthday events good and bad.  CSW explored her lack of boundaries with herself so soon after surgery and the extension of PT from 8 sessions to 16 sessions because she was not making sufficient progress, with that lack of boundaries being demonstrated by her overworking herself physically to get the house like she wants through moving furniture, climbing up ladders, and moving heavy totes around.  She explained that she is embarrassed to have people over the way the house and yard look currently, "not decent."  CSW challenged this way of thinking, asking her what her future self at 63yo will look back and think if her body has continued to deteriorate because of these activities.  She stated initially she wouldn't like it but it would not stop her, because 10-15 years ago she worked hard physically, causing her current physical conditions.  As we explored this more closely, she was able to see that the hard work back then was not worth the pain she lives in now, and the hard work she does not will not be worth the pain she is potentially heading for in the future as a result.  She immediately then pivoted, as she often does, to the fact  that it would not be so hard or strenuous if her husband would help.  CSW pointed out that this is water over the dam, as she has asked repeatedly and knows exactly what to expect.  So the question really  becomes what is important enough to either risk her own pain or ask someone else to help with.  We also reviewed her birthday plans and how he insisted on doing something she did not want to do, after she told him explicitly what she wanted.  CSW once again pointed out her right to have boundaries and say "no" to him, but she resisted this idea saying he would be mad and makes things hard for her.  When CSW asked how he would do this, she had few examples other than him not helping around the house.  She did demonstrate some boundaries after the fact by telling him she appreciated it, but would have preferred her original request and/or help around the house.  CSW celebrated the courage she displayed in setting this healthy boundary, reinforcing that this is likely to make future events be handled somewhat differently by him.  Finally, CSW empowered patient to talk more about her description of feeling invisible since childhood, on through adulthood, and into her current relationship.  She stated her mother always told her not to smile for school pictures or mother would refuse to buy the pictures, because she has gaps between her teeth.  She would have to come home from school, do her chores, then help mother move furniture, which is where she believes she got these tendencies.  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   This matter of feeling invisible should continue to be addressed in future sessions.  Suicidal/Homicidal: No without intent/plan  Therapist Response:  Patient is progressing AEB engaging in scheduled therapy session.  Throughout the session, CSW gave patient the opportunity to explore thoughts and feelings associated with current life situations and past/present stressors.   CSW challenged patient gently and appropriately to consider different ways of looking at  reported issues. CSW encouraged patient's expression of feelings and validated these using empathy, active listening, open body language, and unconditional positive regard.   CSW encouraged patient to schedule more therapy sessions for the future, as needed.  We discussed her participating in groups every other week until next appointment on 3/20 and she is agreeable.  She was ready to participate two weeks ago when her sons arrived to visit and she rarely sees them, so she opted to do that.    Recommendations:  Return to therapy in 2 weeks, continue to consider what she would need to do in her relationship to not feel invisible  Plan: Return again in 2 weeks for group, on 2/19  Diagnosis:  GAD (generalized anxiety disorder)  Moderate episode of recurrent major depressive disorder (HCC)  Collaboration of Care: Psychiatrist AEB -   psychiatrist can read therapy notes; therapist can and does read psychiatric notes prior to sessions   Patient/Guardian was advised Release of Information must be obtained prior to any record release in order to collaborate their care with an outside provider. Patient/Guardian was advised if they have not already done so to contact the registration department to sign all necessary forms in order for [place] to release information regarding their care.   Consent: Patient/Guardian gives verbal  consent for treatment and assignment of benefits for services provided during this visit. Patient/Guardian expressed understanding and agreed to proceed.      09/23/2023    2:49 PM 09/12/2023    3:28 PM 08/07/2023   11:00 AM 05/13/2023    8:21 AM 04/25/2023   10:22 AM 11/23/2022   11:16 AM  Depression screen PHQ 2/9  Decreased Interest 3 3 2 1 3 3   Down, Depressed, Hopeless 3 2 2 1 3 3   PHQ - 2 Score 6 5 4 2 6 6   Altered sleeping 3 3 3 3 3 3   Tired, decreased energy 3 3 3 3 3 3   Change in appetite 2 3 3 1 3 3   Feeling bad or failure about yourself  3 2 2 3 2 3   Trouble  concentrating 2 2 2 3 1 2   Moving slowly or fidgety/restless 2 2 2  0 1 1  Suicidal thoughts 0 0 0 0 0 0  PHQ-9 Score 21 20 19 15 19 21   Difficult doing work/chores  Very difficult Extremely dIfficult  Not difficult at all Very difficult      09/23/2023    2:52 PM 09/12/2023    3:28 PM 08/07/2023   11:00 AM 04/25/2023   10:23 AM 11/23/2022   11:17 AM  GAD 7 : Generalized Anxiety Score  Nervous, Anxious, on Edge 3 3 3 3 3   Control/stop worrying 3 3 3 3 3   Worry too much - different things 3 3 3 3 3   Trouble relaxing 3 2 2 3 2   Restless 2 2 2 3 2   Easily annoyed or irritable 3 2 2 3 2   Afraid - awful might happen 3 2 2 2 2   Total GAD 7 Score 20 17 17 20 17   Anxiety Difficulty Very difficult Extremely difficult Very difficult Not difficult at all Very difficult      Lynnell Chad, LCSW 12/19/2023
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
## 2024-01-08 ENCOUNTER — Other Ambulatory Visit: Payer: Self-pay | Admitting: Family Medicine

## 2024-01-08 DIAGNOSIS — E782 Mixed hyperlipidemia: Secondary | ICD-10-CM

## 2024-01-10 ENCOUNTER — Telehealth (HOSPITAL_COMMUNITY): Admitting: Psychiatry

## 2024-01-10 ENCOUNTER — Encounter (HOSPITAL_COMMUNITY): Payer: Self-pay | Admitting: Psychiatry

## 2024-01-10 DIAGNOSIS — F331 Major depressive disorder, recurrent, moderate: Secondary | ICD-10-CM

## 2024-01-10 DIAGNOSIS — F411 Generalized anxiety disorder: Secondary | ICD-10-CM

## 2024-01-10 MED ORDER — BUPROPION HCL ER (XL) 300 MG PO TB24
300.0000 mg | ORAL_TABLET | Freq: Every day | ORAL | 1 refills | Status: DC
Start: 2024-01-10 — End: 2024-05-29

## 2024-01-15 ENCOUNTER — Encounter (HOSPITAL_COMMUNITY): Payer: Self-pay | Admitting: Clinical

## 2024-01-15 ENCOUNTER — Ambulatory Visit (INDEPENDENT_AMBULATORY_CARE_PROVIDER_SITE_OTHER): Payer: Medicare HMO | Admitting: Clinical

## 2024-01-15 DIAGNOSIS — F411 Generalized anxiety disorder: Secondary | ICD-10-CM

## 2024-01-15 DIAGNOSIS — F331 Major depressive disorder, recurrent, moderate: Secondary | ICD-10-CM | POA: Diagnosis not present

## 2024-01-15 NOTE — Progress Notes (Signed)
 THERAPIST PROGRESS NOTE  Session Time: 2:05-3:00pm   Session #18  Participation Level: Active  Behavioral Response: Casual Alert Negative, Hopeless, and Irritable  Type of Therapy: Individual Therapy  Goals addressed:  New treatment goals established, current goals reviewed: LTG: Identify and decrease cognitive distortions contributing negatively to mood and behavior by identifying 5-7 cognitive distortions Titania has and learning how to come up with replacement thoughts that are more balanced, realistic, and helpful.  STG: Learn a variety of coping skills and demonstrate the ability to use them to decrease feelings of sadness, anger, and fear and increase feelings of happiness, peace, and powerfulness AEB gauging those emotions on 1-10 scale.  Julia [name] will increase her resilience through application of CBT techniques and through processing of her life in a shame framework  LTG: Learn about boundary types, how to implement them, and how to enforce them so that Julia [name] feels more empowered and content with being able to control her own life where/when possible.  LTG: Learn breathing techniques and grounding techniques at an age-appropriate level and demonstrate mastery in session then report independent use of these skills out of session.  STG: Score less than 9 on the PHQ-9 and less than 5 on the GAD-7 as evidenced by intermittent administration of the questionnaires to determine progress in managing depression and anxiety.  LTG: Improve self-esteem by engaging in daily affirmations, developing new skills, gratitude journaling, use of SMART goals, increased assertiveness, challenging negative beliefs, and focusing on what patient can control    LTG: Work to Arts development officer from models like CBT, Stages of Change, DBT, shame resilience theory, ACT, SFBT, MI, trauma-informed therapy and others to be able to manage mental health symptoms, AEB practicing out of session and reporting back.   Learn and  practice communication techniques such as active listening, "I" statements, open-ended questions, reflective listening, assertiveness, fair fighting rules, initiating conversations, and more as necessary and taught in session  ProgressTowards Goals: Progressing  Interventions: CBT, Supportive, and Other: "I" statements again    Summary: Julia [name] is a 63 y.o. female who presents with anxiety and depression for therapy.  She presented oriented x5 and stated she was feeling "knee pain that shouldn't have, but haven't gone to doctor yet."  CSW evaluated patient's medication compliance, use of coping tools, and self-care, as applicable.  She provided an update on various aspects of her life that are normally discussed in therapy, including her knee pain, having 2 weeks with "Julia [name]" recently and wishing she could go back, anger with husband who has not met her expectations or done "anything" while she was gone, and her desire to refuse to babysit her husband's 57mo grandchild although they expect it of her.  She acknowledged that she would tell anyone other than herself to go to the doctor about her knee, but would not make a commitment to go for herself.  She was solely focused on her anger toward husband for not getting things done in her absence including building stairs to her storage facility, saying that she could see on camera what he did during her absence.  We talked again about using "I" statements to make her requests and she stated that they "get me nowhere."  CSW taught how to use this type of language in follow-up when disappointed with someone.  She acknowledged that watching her granddaughter today for the first time since her surgery was quite difficult and she does not want to babysit her step-grandchild.  She feels guilty,  so we processed this through a CBT lens, which helped her slightly.  We also processed some other feelings and thoughts in this manner with less successful results,  particularly with regard to her blame of husband. CSW suggested several times that she get him to accompany her to a therapy session or two in order for us  to try to help him understand how she feels so unimportant when he does for others but not her.  She reluctantly agreed to think about it.    Her feeling invisible should continue to be addressed in future sessions.  Suicidal/Homicidal: No without intent/plan  Therapist Response:  Patient is progressing AEB engaging in scheduled therapy session.  Throughout the session, CSW gave patient the opportunity to explore thoughts and feelings associated with current life situations and past/present stressors.   CSW challenged patient gently and appropriately to consider different ways of looking at reported issues. CSW encouraged patient's expression of feelings and validated these using empathy, active listening, open body language, and unconditional positive regard.   CSW encouraged patient to participate in group since her next appointment is not until 6/4.  She agreed to try 4/30.    Plan/Recommendations:  Return to group therapy in 2 weeks on 4/30 and to individual therapy on 6/4, continue to consider what she would need to do in her relationship to not feel invisible, come prepared to talk about whether to add her significant other for a few sessions to address how she feels with his tendency to do for others but not her.  Diagnosis:  Moderate episode of recurrent major depressive disorder (HCC)  GAD (generalized anxiety disorder)  Collaboration of Care: Psychiatrist AEB -   psychiatrist can read therapy notes; therapist can and does read psychiatric notes prior to sessions   Patient/Guardian was advised Release of Information must be obtained prior to any record release in order to collaborate their care with an outside provider. Patient/Guardian was advised if they have not already done so to contact the registration department to sign all  necessary forms in order for us  to release information regarding their care.   Consent: Patient/Guardian gives verbal consent for treatment and assignment of benefits for services provided during this visit. Patient/Guardian expressed understanding and agreed to proceed.        Ancel Kass, LCSW 01/15/2024

## 2024-01-29 ENCOUNTER — Ambulatory Visit (HOSPITAL_COMMUNITY): Admitting: Clinical

## 2024-01-29 ENCOUNTER — Ambulatory Visit (INDEPENDENT_AMBULATORY_CARE_PROVIDER_SITE_OTHER): Admitting: Clinical

## 2024-01-29 ENCOUNTER — Encounter (HOSPITAL_COMMUNITY): Payer: Self-pay

## 2024-01-29 DIAGNOSIS — Z91199 Patient's noncompliance with other medical treatment and regimen due to unspecified reason: Secondary | ICD-10-CM

## 2024-01-29 NOTE — Progress Notes (Signed)
 Therapy Progress Note  Patient had a virtual appointment scheduled with therapist on 01/29/2024  at 5:00pm.  CSW corresponded by text with patient throughout the day and she was aware of the time and format of the meeting.  She did not join via MyChart, despite being sent the link 3 times at 5:01pm and 5:05pm and 5:11pm.  She did not show up by 5:20pm so the link was closed.  Encounter Diagnosis  Name Primary?   No-show for appointment Yes     [name], LCSW 01/29/2024, 5:23 PM

## 2024-02-03 ENCOUNTER — Other Ambulatory Visit: Payer: Self-pay | Admitting: Family Medicine

## 2024-02-03 DIAGNOSIS — M1990 Unspecified osteoarthritis, unspecified site: Secondary | ICD-10-CM

## 2024-02-03 DIAGNOSIS — G629 Polyneuropathy, unspecified: Secondary | ICD-10-CM
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
## 2024-02-07 ENCOUNTER — Telehealth (HOSPITAL_COMMUNITY): Admitting: Psychiatry
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
## 2024-02-13 ENCOUNTER — Encounter (HOSPITAL_COMMUNITY): Payer: Self-pay

## 2024-02-13 ENCOUNTER — Encounter (HOSPITAL_COMMUNITY): Admitting: Psychiatry

## 2024-02-13 NOTE — Progress Notes (Signed)
 This encounter was created in error - please disregard.  Patient did not show up for the appointment. Multiple appointment reminders were sent both to patients phone and email. Attempted to call the patient at 8:33 but received no response. Left a voicemail for patient to reschedule.

## 2024-03-04 ENCOUNTER — Encounter (HOSPITAL_COMMUNITY): Payer: Self-pay | Admitting: Clinical

## 2024-03-04 ENCOUNTER — Ambulatory Visit (HOSPITAL_COMMUNITY): Admitting: Clinical

## 2024-03-04 DIAGNOSIS — F331 Major depressive disorder, recurrent, moderate: Secondary | ICD-10-CM | POA: Diagnosis not present

## 2024-03-04 DIAGNOSIS — F411 Generalized anxiety disorder: Secondary | ICD-10-CM | POA: Diagnosis not present

## 2024-03-04 NOTE — Progress Notes (Signed)
 THERAPIST PROGRESS NOTE  Session Time: 2:01-3:00pm   Session #19  Virtual Visit via Video Note  I connected with Julia [name] on 03/04/24 at  2:00 PM EDT by a video enabled telemedicine application and verified that I am speaking with the correct person using two identifiers.  Location: Patient: home Provider: home office   I discussed the limitations of evaluation and management by telemedicine and the availability of in person appointments. The patient expressed understanding and agreed to proceed.  I discussed the assessment and treatment plan with the patient. The patient was provided an opportunity to ask questions and all were answered. The patient agreed with the plan and demonstrated an understanding of the instructions.   The patient was advised to call back or seek an in-person evaluation if the symptoms worsen or if the condition fails to improve as anticipated.  I provided 59 minutes of non-face-to-face time during this encounter.  Ancel Kass, LCSW   Participation Level: Active  Behavioral Response: Casual Alert Negative and Euthymic  Type of Therapy: Individual Therapy  Goals addressed:  LTG: Identify and decrease cognitive distortions contributing negatively to mood and behavior by identifying 5-7 cognitive distortions Julia [name] has and learning how to come up with replacement thoughts that are more balanced, realistic, and helpful.  STG: Learn a variety of coping skills and demonstrate the ability to use them to decrease feelings of sadness, anger, and fear and increase feelings of happiness, peace, and powerfulness AEB gauging those emotions on 1-10 scale.  Julia [name] will increase her resilience through application of CBT techniques and through processing of her life in a shame framework  LTG: Learn about boundary types, how to implement them, and how to enforce them so that Julia [name] feels more empowered and content with being able to control her own life  where/when possible.  LTG: Learn breathing techniques and grounding techniques at an age-appropriate level and demonstrate mastery in session then report independent use of these skills out of session.  STG: Score less than 9 on the PHQ-9 and less than 5 on the GAD-7 as evidenced by intermittent administration of the questionnaires to determine progress in managing depression and anxiety.  LTG: Improve self-esteem by engaging in daily affirmations, developing new skills, gratitude journaling, use of SMART goals, increased assertiveness, challenging negative beliefs, and focusing on what patient can control    LTG: Work to Arts development officer from models like CBT, Stages of Change, DBT, shame resilience theory, ACT, SFBT, MI, trauma-informed therapy and others to be able to manage mental health symptoms, AEB practicing out of session and reporting back.   Learn and practice communication techniques such as active listening, "I" statements, open-ended questions, reflective listening, assertiveness, fair fighting rules, initiating conversations, and more as necessary and taught in session  ProgressTowards Goals: Progressing  Interventions: Supportive and Social Skills Training   Summary: Julia [name] is a 63 y.o. female who presents with anxiety and depression for therapy.  She presented oriented x5 and stated she was feeling "okay, not too bad."  CSW evaluated patient's medication compliance, use of coping tools, and self-care, as applicable.  She provided an update on various aspects of her life that are normally discussed in therapy, including issues with her knee after surgery, her family, and her husband.  She stated she is still on a "roller coaster" with husband but it is a little better because she is not letting what he does alter what she does as much.  He still  has not put stairs up to her storage building outside, so she managed to put a cinder block there as a temporary measure to be able to  still work in it.  She is not getting to keep her granddaughter as frequently, and with other things going on, she has decided to go back to see her friend Julia [name] again and help her with an upcoming medical procedure.  As with last time, she does not ask permission but just tells her husband what she is going to do.  He would like to go on vacation as well, but she reiterated the stipulations that she has in order to go anywhere with him.  Although he does not like them, she is not backing down.  This is helping with her feelings of being invisible, because she knows that she herself at least is seeing and acknowledging her.  She continues to have sleep troubles which keep her up at least until 2-3am, plans to talk to the psychiatrist about this next visit.  She normally sleeps on the couch, but last night slept on their day bed and slept wonderfully, so she suspects the mattress on their bed is perhaps the culprit.  CSW applauded her and encouraged her to keep doing what she is doing, that is focusing on her own wants and needs rather than on husband's deficits.  Suicidal/Homicidal: No without intent/plan  Therapist Response:  Patient is progressing AEB engaging in scheduled therapy session.  Throughout the session, CSW gave patient the opportunity to explore thoughts and feelings associated with current life situations and past/present stressors.   CSW challenged patient gently and appropriately to consider different ways of looking at reported issues. CSW encouraged patient's expression of feelings and validated these using empathy, active listening, open body language, and unconditional positive regard.  Additional appointments were made for her.     Plan/Recommendations:  Return to next scheduled appointment, do what she wishes whether husband wants to do it or not, so that she can do the things in life she wishes without resenting him  Diagnosis:  GAD (generalized anxiety disorder)  Moderate episode  of recurrent major depressive disorder (HCC)  Collaboration of Care: Psychiatrist AEB -  psychiatrist can read therapy notes; therapist can and does read psychiatric notes prior to sessions   Patient/Guardian was advised Release of Information must be obtained prior to any record release in order to collaborate their care with an outside provider. Patient/Guardian was advised if they have not already done so to contact the registration department to sign all necessary forms in order for us  to release information regarding their care.   Consent: Patient/Guardian gives verbal consent for treatment and assignment of benefits for services provided during this visit. Patient/Guardian expressed understanding and agreed to proceed.        Ancel Kass, LCSW 03/04/2024

## 2024-03-09 ENCOUNTER — Other Ambulatory Visit: Payer: Self-pay | Admitting: Family Medicine

## 2024-03-09 DIAGNOSIS — G629 Polyneuropathy, unspecified: Secondary | ICD-10-CM

## 2024-03-09 DIAGNOSIS — M1990 Unspecified osteoarthritis, unspecified site: Secondary | ICD-10-CM
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
## 2024-04-01 ENCOUNTER — Ambulatory Visit (HOSPITAL_COMMUNITY): Admitting: Clinical

## 2024-04-01 ENCOUNTER — Encounter (HOSPITAL_COMMUNITY): Payer: Self-pay | Admitting: Clinical

## 2024-04-01 DIAGNOSIS — F331 Major depressive disorder, recurrent, moderate: Secondary | ICD-10-CM

## 2024-04-01 DIAGNOSIS — F411 Generalized anxiety disorder: Secondary | ICD-10-CM | POA: Diagnosis not present

## 2024-04-01 NOTE — Progress Notes (Signed)
 THERAPIST PROGRESS NOTE  Session Time: 2:06-3:02pm   Session #20  Participation Level: Active  Behavioral Response: Casual Alert Euthymic  Type of Therapy: Individual Therapy  Goals addressed:  LTG: Identify and decrease cognitive distortions contributing negatively to mood and behavior by identifying 5-7 cognitive distortions [name] has and learning how to come up with replacement thoughts that are more balanced, realistic, and helpful.  STG: Learn a variety of coping skills and demonstrate the ability to use them to decrease feelings of sadness, anger, and fear and increase feelings of happiness, peace, and powerfulness AEB gauging those emotions on 1-10 scale.  Julia [name] will increase her resilience through application of CBT techniques and through processing of her life in a shame framework  LTG: Learn about boundary types, how to implement them, and how to enforce them so that [name] feels more empowered and content with being able to control her own life where/when possible.  LTG: Learn breathing techniques and grounding techniques at an age-appropriate level and demonstrate mastery in session then report independent use of these skills out of session.  STG: Score less than 9 on the PHQ-9 and less than 5 on the GAD-7 as evidenced by intermittent administration of the questionnaires to determine progress in managing depression and anxiety.  LTG: Improve self-esteem by engaging in daily affirmations, developing new skills, gratitude journaling, use of SMART goals, increased assertiveness, challenging negative beliefs, and focusing on what patient can control    LTG: Work to Arts development officer from models like CBT, Stages of Change, DBT, shame resilience theory, ACT, SFBT, MI, trauma-informed therapy and others to be able to manage mental health symptoms, AEB practicing out of session and reporting back.   Learn and practice communication techniques such as active listening, I statements,  open-ended questions, reflective listening, assertiveness, fair fighting rules, initiating conversations, and more as necessary and taught in session  ProgressTowards Goals: Progressing  Interventions: Assertiveness Training, Supportive, and Other: communication, asking for what she wants   Summary: Julia [name] is a 63 y.o. female who presents with anxiety and depression for therapy.  She presented oriented x5 and stated she was feeling okay, good.  CSW evaluated patient's medication compliance, use of coping tools, and self-care, as applicable.  She provided an update on various aspects of her life that are normally discussed in therapy, including her recent trip to see a friend, communication with daughter-in-law, and return home.  She described in detail the good and bad parts of a trip to see a friend and friend's partner.  She stated one night she had a breakdown and cried because she wanted to FaceTime with her granddaughter and her daughter-in-law refused.  This was hurtful because she was with her other grandmother for a beach trip and they never invite patient to go on trips with them like they do this other grandmother.  Her friend ended up telling patient's son that patient was crying, so he spoke with his wife and not only did she get to talk to her granddaughter that night, she did for every other night as well.  CSW pointed out that she often is unwilling to ask people to change their behavior, but somebody did it on her behalf and it turned out really well, using this to urge her to speak up about her needs herself with the hope of the same happening.  She did go on to complain about ongoing same issues with husband doing for other people, but not prioritizing her.  CSW  listened and empathized, continued to encourage her to speak up and tell him her needs.  Suicidal/Homicidal: No without intent/plan  Therapist Response:  Patient is progressing AEB engaging in scheduled therapy  session.  Throughout the session, CSW gave patient the opportunity to explore thoughts and feelings associated with current life situations and past/present stressors.   CSW challenged patient gently and appropriately to consider different ways of looking at reported issues. CSW encouraged patient's expression of feelings and validated these using empathy, active listening, open body language, and unconditional positive regard.  An additional appointment was made for her.     Plan/Recommendations:  Return to next scheduled appointment, do what she wishes whether husband wants to do it or not, so that she can do the things in life she wishes without resenting him  Diagnosis:  GAD (generalized anxiety disorder)  Moderate episode of recurrent major depressive disorder (HCC)  Collaboration of Care: Psychiatrist AEB -  psychiatrist can read therapy notes; therapist can and does read psychiatric notes prior to sessions   Patient/Guardian was advised Release of Information must be obtained prior to any record release in order to collaborate their care with an outside provider. Patient/Guardian was advised if they have not already done so to contact the registration department to sign all necessary forms in order for us  to release information regarding their care.   Consent: Patient/Guardian gives verbal consent for treatment and assignment of benefits for services provided during this visit. Patient/Guardian expressed understanding and agreed to proceed.        Elgie JINNY Crest, LCSW 04/01/2024

## 2024-04-02 ENCOUNTER — Telehealth (HOSPITAL_COMMUNITY): Admitting: Psychiatry

## 2024-04-02 ENCOUNTER — Encounter (HOSPITAL_COMMUNITY): Payer: Self-pay | Admitting: Psychiatry

## 2024-04-02 DIAGNOSIS — F331 Major depressive disorder, recurrent, moderate: Secondary | ICD-10-CM | POA: Diagnosis not present

## 2024-04-02 DIAGNOSIS — F411 Generalized anxiety disorder: Secondary | ICD-10-CM

## 2024-04-02 MED ORDER — DULOXETINE HCL 60 MG PO CPEP: 60.0000 mg | ORAL_CAPSULE | Freq: Two times a day (BID) | ORAL | 0 refills | Status: DC

## 2024-04-02 MED ORDER — TRAZODONE HCL 50 MG PO TABS: 25.0000 mg | ORAL_TABLET | Freq: Every day | ORAL | 1 refills | Status: DC

## 2024-04-22 ENCOUNTER — Ambulatory Visit (HOSPITAL_COMMUNITY): Admitting: Clinical

## 2024-04-22 ENCOUNTER — Encounter (HOSPITAL_COMMUNITY): Payer: Self-pay | Admitting: Clinical

## 2024-04-22 DIAGNOSIS — F331 Major depressive disorder, recurrent, moderate: Secondary | ICD-10-CM | POA: Diagnosis not present

## 2024-04-22 DIAGNOSIS — F411 Generalized anxiety disorder: Secondary | ICD-10-CM

## 2024-04-22 NOTE — Progress Notes (Signed)
 THERAPIST PROGRESS NOTE  Session Time: 2:02-3:02pm   Session #21  Participation Level: Active  Behavioral Response: Casual Alert Depressed, Hopeless, and Tearful  Type of Therapy: Individual Therapy  Goals addressed:  LTG: Identify and decrease cognitive distortions contributing negatively to mood and behavior by identifying 5-7 cognitive distortions Sparkles has and learning how to come up with replacement thoughts that are more balanced, realistic, and helpful.  STG: Learn a variety of coping skills and demonstrate the ability to use them to decrease feelings of sadness, anger, and fear and increase feelings of happiness, peace, and powerfulness AEB gauging those emotions on 1-10 scale.  Sharolyn will increase her resilience through application of CBT techniques and through processing of her life in a shame framework  LTG: Learn about boundary types, how to implement them, and how to enforce them so that Jaritza feels more empowered and content with being able to control her own life where/when possible.  LTG: Learn breathing techniques and grounding techniques at an age-appropriate level and demonstrate mastery in session then report independent use of these skills out of session.  STG: Score less than 9 on the PHQ-9 and less than 5 on the GAD-7 as evidenced by intermittent administration of the questionnaires to determine progress in managing depression and anxiety.  LTG: Improve self-esteem by engaging in daily affirmations, developing new skills, gratitude journaling, use of SMART goals, increased assertiveness, challenging negative beliefs, and focusing on what patient can control    LTG: Work to Arts development officer from models like CBT, Stages of Change, DBT, shame resilience theory, ACT, SFBT, MI, trauma-informed therapy and others to be able to manage mental health symptoms, AEB practicing out of session and reporting back.   Learn and practice communication techniques such as active  listening, I statements, open-ended questions, reflective listening, assertiveness, fair fighting rules, initiating conversations, and more as necessary and taught in session  ProgressTowards Goals: Progressing  Interventions: Assertiveness Training, Supportive, and Reframing   Summary: Julia [name] is a 63 y.o. female who presents with anxiety and depression for therapy.  She presented oriented x5 and stated she was feeling stressed, had a bad 2 weeks, so bad I wanted to die, the only thing that kept me going was my granddaughter.  CSW evaluated patient's medication compliance, use of coping tools, and self-care, as applicable.  She provided an update on various aspects of her life that are normally discussed in therapy, including husband's son/wife/4yo stepson moving in next door, her sons/their wives going on a cruise, and feeling husband continues to put everyone else's desires above her own.  She cried throughout the entirety of the session.  She remains very upset over one of her sons and his wife deciding that she and her husband are not invited to go on a cruise they are all taking together, although the other two families thought it was a good idea for them to go.  She was focused on her thought that He didn't want me.  CSW took her through CBT examination of that thought and a later thought that Nobody cared about me.  This was successful in helping her to revise her thoughts, although her feelings remain hurt.  She agreed to keep thinking about that.  She is starting to set boundaries with herself doing things for her husband that would be best for him to do for himself since they cause her physical pain, was given positive strokes for this.  He appears to try to manipulate her  feelings with anger when she makes requests for him to do things and observes that he will prioritize others' needs.  We explored how he is doing what he has always done, so it is not a surprise, and what is  actually different is her approach.  CSW reminded her it took a long time to get to this point and will take a long time to undo.  Suicidal/Homicidal: No without intent/plan  Therapist Response:  Patient is progressing AEB engaging in scheduled therapy session.  Throughout the session, CSW gave patient the opportunity to explore thoughts and feelings associated with current life situations and past/present stressors.   CSW challenged patient gently and appropriately to consider different ways of looking at reported issues. CSW encouraged patient's expression of feelings and validated these using empathy, active listening, open body language, and unconditional positive regard.  An additional appointment was made.    Plan/Recommendations:  Return to next scheduled appointment on 8/20, continue to set boundaries, use CBT to challenge thoughts as reviewed in session  Diagnosis:  Moderate episode of recurrent major depressive disorder (HCC)  GAD (generalized anxiety disorder)  Collaboration of Care: Psychiatrist AEB -  psychiatrist can read therapy notes; therapist can and does read psychiatric notes prior to sessions   Patient/Guardian was advised Release of Information must be obtained prior to any record release in order to collaborate their care with an outside provider. Patient/Guardian was advised if they have not already done so to contact the registration department to sign all necessary forms in order for us  to release information regarding their care.   Consent: Patient/Guardian gives verbal consent for treatment and assignment of benefits for services provided during this visit. Patient/Guardian expressed understanding and agreed to proceed.        Elgie JINNY Crest, LCSW 04/22/2024

## 2024-05-13 ENCOUNTER — Ambulatory Visit: Payer: Medicare HMO | Admitting: Family Medicine

## 2024-05-13 ENCOUNTER — Encounter: Payer: Self-pay | Admitting: Family Medicine

## 2024-05-13 ENCOUNTER — Ambulatory Visit: Payer: Medicare HMO

## 2024-05-13 VITALS — BP 102/73 | HR 81 | Ht 61.0 in | Wt 162.0 lb

## 2024-05-13 VITALS — BP 126/72 | HR 85 | Temp 97.1°F | Ht 61.0 in | Wt 164.6 lb

## 2024-05-13 DIAGNOSIS — G629 Polyneuropathy, unspecified: Secondary | ICD-10-CM | POA: Diagnosis not present

## 2024-05-13 DIAGNOSIS — Z0000 Encounter for general adult medical examination without abnormal findings: Secondary | ICD-10-CM

## 2024-05-13 DIAGNOSIS — E538 Deficiency of other specified B group vitamins: Secondary | ICD-10-CM | POA: Diagnosis not present

## 2024-05-13 DIAGNOSIS — E66811 Obesity, class 1: Secondary | ICD-10-CM | POA: Diagnosis not present

## 2024-05-13 DIAGNOSIS — H6503 Acute serous otitis media, bilateral: Secondary | ICD-10-CM | POA: Diagnosis not present

## 2024-05-13 DIAGNOSIS — M1990 Unspecified osteoarthritis, unspecified site: Secondary | ICD-10-CM | POA: Diagnosis not present

## 2024-05-13 DIAGNOSIS — E559 Vitamin D deficiency, unspecified: Secondary | ICD-10-CM

## 2024-05-13 DIAGNOSIS — Z1231 Encounter for screening mammogram for malignant neoplasm of breast: Secondary | ICD-10-CM

## 2024-05-13 DIAGNOSIS — Z0001 Encounter for general adult medical examination with abnormal findings: Secondary | ICD-10-CM | POA: Diagnosis not present

## 2024-05-13 DIAGNOSIS — Z23 Encounter for immunization: Secondary | ICD-10-CM | POA: Diagnosis not present

## 2024-05-13 DIAGNOSIS — R6889 Other general symptoms and signs: Secondary | ICD-10-CM

## 2024-05-13 DIAGNOSIS — F331 Major depressive disorder, recurrent, moderate: Secondary | ICD-10-CM

## 2024-05-13 DIAGNOSIS — F411 Generalized anxiety disorder: Secondary | ICD-10-CM

## 2024-05-13 DIAGNOSIS — E782 Mixed hyperlipidemia: Secondary | ICD-10-CM | POA: Diagnosis not present

## 2024-05-13 LAB — BAYER DCA HB A1C WAIVED: HB A1C (BAYER DCA - WAIVED): 5.5 % (ref 4.8–5.6)

## 2024-05-13 MED ORDER — LORATADINE 10 MG PO TABS: 10.0000 mg | ORAL_TABLET | Freq: Every day | ORAL | 11 refills | Status: AC

## 2024-05-13 MED ORDER — MELOXICAM 15 MG PO TABS: 15.0000 mg | ORAL_TABLET | Freq: Every day | ORAL | 3 refills | Status: DC

## 2024-05-13 MED ORDER — GABAPENTIN 300 MG PO CAPS: 300.0000 mg | ORAL_CAPSULE | Freq: Two times a day (BID) | ORAL | 3 refills | Status: DC

## 2024-05-13 MED ORDER — ATORVASTATIN CALCIUM 40 MG PO TABS
40.0000 mg | ORAL_TABLET | Freq: Every evening | ORAL | 1 refills | Status: AC
Start: 2024-05-13 — End: ?

## 2024-05-13 NOTE — Patient Instructions (Signed)
 Julia [name] , Thank you for taking time out of your busy schedule to complete your Annual Wellness Visit with me. I enjoyed our conversation and look forward to speaking with you again next year. I, as well as your care team,  appreciate your ongoing commitment to your health goals. Please review the following plan we discussed and let me know if I can assist you in the future. Your Game plan/ To Do List    Referrals: If you haven't heard from the office you've been referred to, please reach out to them at the phone provided.   Follow up Visits: We will see or speak with you next year for your Next Medicare AWV with our clinical staff on 05/14/25 at 9:20a.m. Have you seen your provider in the last 6 months (3 months if uncontrolled diabetes)? Yes  Clinician Recommendations:  Aim for 30 minutes of exercise or brisk walking, 6-8 glasses of water, and 5 servings of fruits and vegetables each day.       This is a list of the screenings recommended for you:  Health Maintenance  Topic Date Due   Pneumococcal Vaccine for age over 22 (1 of 1 - PCV) Never done   DTaP/Tdap/Td vaccine (2 - Td or Tdap) 11/12/2023   Medicare Annual Wellness Visit  05/12/2024   Flu Shot  05/01/2024   DEXA scan (bone density measurement)  12/22/2024   Mammogram  08/04/2025   Colon Cancer Screening  09/08/2029   Hepatitis C Screening  Completed   HIV Screening  Completed   Zoster (Shingles) Vaccine  Completed   Hepatitis B Vaccine  Aged Out   HPV Vaccine  Aged Out   Meningitis B Vaccine  Aged Out   COVID-19 Vaccine  Discontinued    Advanced directives: (Declined) Advance directive discussed with you today. Even though you declined this today, please call our office should you change your mind, and we can give you the proper paperwork for you to fill out. Advance Care Planning is important because it:  [x]  Makes sure you receive the medical care that is consistent with your values, goals, and preferences  [x]  It  provides guidance to your family and loved ones and reduces their decisional burden about whether or not they are making the right decisions based on your wishes.  Follow the link provided in your after visit summary or read over the paperwork we have mailed to you to help you started getting your Advance Directives in place. If you need assistance in completing these, please reach out to us  so that we can help you!  See attachments for Preventive Care and Fall Prevention Tips.

## 2024-05-13 NOTE — Progress Notes (Signed)
 Subjective:   Julia [name] is a 63 y.o. who presents for a Medicare Wellness preventive visit.  As a reminder, Annual Wellness Visits don't include a physical exam, and some assessments may be limited, especially if this visit is performed virtually. We may recommend an in-person follow-up visit with your provider if needed.  Visit Complete: Virtual I connected with  Julia [name] on 05/13/24 by a audio enabled telemedicine application and verified that I am speaking with the correct person using two identifiers.  Patient Location: Home  Provider Location: Home Office  I discussed the limitations of evaluation and management by telemedicine. The patient expressed understanding and agreed to proceed.  Vital Signs: Because this visit was a virtual/telehealth visit, some criteria may be missing or patient reported. Any vitals not documented were not able to be obtained and vitals that have been documented are patient reported.  VideoDeclined- This patient declined Librarian, academic. Therefore the visit was completed with audio only.  Persons Participating in Visit: Patient.  AWV Questionnaire: Yes: Patient Medicare AWV questionnaire was completed by the patient on 05/12/24; I have confirmed that all information answered by patient is correct and no changes since this date.        Objective:    Today's Vitals   05/13/24 1025 05/13/24 1026  BP: 102/73   Pulse: 81   Weight: 162 lb (73.5 kg)   Height: 5' 1 (1.549 m)   PainSc:  5    Body mass index is 30.61 kg/m.     05/13/2024   10:29 AM 10/18/2023   12:34 PM 05/13/2023    8:20 AM 05/09/2022    9:05 AM 05/08/2021    9:14 AM 04/05/2021   12:35 PM 05/22/2020    5:33 PM  Advanced Directives  Does Patient Have a Medical Advance Directive? No No No No No No   Would patient like information on creating a medical advance directive?    No - Patient declined No - Patient declined No - Patient declined No  - Patient declined    Current Medications (verified) Outpatient Encounter Medications as of 05/13/2024  Medication Sig   aspirin  81 MG chewable tablet 81 mg.   atorvastatin  (LIPITOR) 40 MG tablet TAKE ONE TABLET EVERY EVENING   buPROPion  (WELLBUTRIN  XL) 300 MG 24 hr tablet Take 1 tablet (300 mg total) by mouth daily.   Cholecalciferol (VITAMIN D3) 50 MCG (2000 UT) capsule Take 2,000 Units by mouth daily.   DULoxetine  (CYMBALTA ) 60 MG capsule Take 1 capsule (60 mg total) by mouth 2 (two) times daily.   gabapentin  (NEURONTIN ) 300 MG capsule TAKE ONE CAPSULE TWICE DAILY   meloxicam  (MOBIC ) 15 MG tablet TAKE ONE TABLET DAILY   traZODone  (DESYREL ) 50 MG tablet Take 0.5 tablets (25 mg total) by mouth at bedtime.   No facility-administered encounter medications on file as of 05/13/2024.    Allergies (verified) Lamotrigine   History: Past Medical History:  Diagnosis Date   Anxiety 2015   Arthritis 2013   Complication of anesthesia    slow to wake up-does not take much medicine to sedate her   Depression 2015   Esophageal reflux    Hyperlipidemia    Low ferritin 02/10/2021   Paget disease of bone    Pneumonia due to COVID-19 virus 05/20/2020   Vitamin B12 deficiency 02/10/2021   Vitamin D  insufficiency 02/10/2021   Wears glasses    Past Surgical History:  Procedure Laterality Date   ANTERIOR  FUSION CERVICAL SPINE  10/2019   C3-C5   BREAST CYST EXCISION Left    BREAST SURGERY  2000   Small cyst removed   CERVICAL SPINE SURGERY  10/2011   CESAREAN SECTION  1983   Also 1988 1989   DILATION AND CURETTAGE OF UTERUS  1986   JOINT REPLACEMENT  Jan 2025   Total knee replacement   KNEE ARTHROSCOPY Left 07/22/2013   Procedure: LEFT KNEE ARTHROSCOPY WITH DEBRIDEMENT CHONDROPLASTY, REMOVAL LOOSE BODIES, PARTIAL MEDIAL MENISCECTOMY, OPEN EXCISION OF PES GANGLION;  Surgeon: Dempsey JINNY Sensor, MD;  Location: [facility];  Service: Orthopedics;  Laterality: Left;  NO SPECIMEN SENT  PER DR. SENSOR ORDER.   KNEE SURGERY Left 11/21/2021   REPLACEMENT TOTAL KNEE Left 10/15/2023   SPINE SURGERY  10/26/2019   Also 2012   TOTAL ABDOMINAL HYSTERECTOMY  05/2010   TAH   TUBAL LIGATION  1989   Family History  Problem Relation Age of Onset   Thyroid  disease Mother    Congestive Heart Failure Mother    Heart disease Mother    Arthritis Mother    Hyperlipidemia Mother    Miscarriages / Stillbirths Mother    Heart disease Father    Hyperlipidemia Father    Lung cancer Sister    Melanoma Sister    Cancer Sister    Depression Sister    Anxiety disorder Sister    Arthritis Sister    Hyperlipidemia Sister    Varicose Veins Sister    Breast cancer Maternal Aunt    Alcohol abuse Brother    Alcohol abuse Brother    Alcohol abuse Brother    Healthy Brother    Social History   Socioeconomic History   Marital status: Significant Other    Spouse name: Evalene Finder   Number of children: 3   Years of education: Not on file   Highest education level: 12th grade  Occupational History   Occupation: disability  Tobacco Use   Smoking status: Former    Current packs/day: 0.00    Average packs/day: 0.2 packs/day for 6.8 years (1.2 ttl pk-yrs)    Types: Cigarettes    Start date: 01/03/1980    Quit date: 01/02/1985    Years since quitting: 39.3   Smokeless tobacco: Never   Tobacco comments:    Was a light smoker a pack would last a few days  Vaping Use   Vaping status: Never Used  Substance and Sexual Activity   Alcohol use: Not Currently    Alcohol/week: 1.0 standard drink of alcohol    Types: 1 Glasses of wine per week    Comment: occassional   Drug use: No   Sexual activity: Not Currently    Birth control/protection: Abstinence  Other Topics Concern   Not on file  Social History Narrative   2 sons live nearby, one son in [place]     05/09/2022 - Has 3 grandchildren - one almost a year old   Social Drivers of Corporate investment banker Strain: Low Risk   (05/12/2024)   Overall Financial Resource Strain (CARDIA)    Difficulty of Paying Living Expenses: Not very hard  Food Insecurity: No Food Insecurity (05/12/2024)   Hunger Vital Sign    Worried About Running Out of Food in the Last Year: Never true    Ran Out of Food in the Last Year: Never true  Transportation Needs: No Transportation Needs (05/12/2024)   PRAPARE - Transportation    Lack of Transportation (  Medical): No    Lack of Transportation (Non-Medical): No  Physical Activity: Unknown (05/12/2024)   Exercise Vital Sign    Days of Exercise per Week: Patient declined    Minutes of Exercise per Session: Not on file  Stress: Stress Concern Present (05/12/2024)   [employer] of Occupational Health - Occupational Stress Questionnaire    Feeling of Stress: Very much  Social Connections: Socially Isolated (05/12/2024)   Social Connection and Isolation Panel    Frequency of Communication with Friends and Family: Once a week    Frequency of Social Gatherings with Friends and Family: Once a week    Attends Religious Services: Patient declined    Database administrator or Organizations: No    Attends Engineer, structural: Not on file    Marital Status: Divorced    Tobacco Counseling Counseling given: Yes Tobacco comments: Was a light smoker a pack would last a few days    Clinical Intake:  Pre-visit preparation completed: Yes  Pain : 0-10 (L-knee/ankle bothers her night) Pain Score: 5  Pain Type: Chronic pain Pain Location: Knee Pain Orientation: Left Pain Radiating Towards: Rx med/tylenol  Pain Descriptors / Indicators: Constant, Aching Pain Onset: More than a month ago Pain Frequency: Constant     BMI - recorded: 30.61 Nutritional Status: BMI > 30  Obese Nutritional Risks: None Diabetes: Yes  Lab Results  Component Value Date   HGBA1C 5.4 09/09/2023   HGBA1C 5.4 04/25/2023   HGBA1C 5.8 (H) 07/03/2022     How often do you need to have someone help you  when you read instructions, pamphlets, or other written materials from your doctor or pharmacy?: 1 - Never  Interpreter Needed?: No  Information entered by :: alia t/cma   Activities of Daily Living     05/12/2024   10:54 PM  In your present state of health, do you have any difficulty performing the following activities:  Hearing? 0  Vision? 1  Difficulty concentrating or making decisions? 1  Walking or climbing stairs? 1  Dressing or bathing? 0  Doing errands, shopping? 0  Preparing Food and eating ? N  Using the Toilet? N  In the past six months, have you accidently leaked urine? Y  Do you have problems with loss of bowel control? N  Managing your Medications? Y  Managing your Finances? N  Housekeeping or managing your Housekeeping? N    Patient Care Team: Severa Rock HERO, FNP as PCP - General (Family Medicine) Merrily Brooklyn (Psychiatry) Powers, Marsa POUR, MD as Referring Physician (Neurosurgery) Mai Lynwood FALCON, MD as Consulting Physician (Rheumatology) Nahser, Aleene PARAS, MD (Inactive) as Consulting Physician (Cardiology) Kassie Acquanetta Bradley, MD as Consulting Physician (Pulmonary Disease) Ladora Ross Lacy Phebe, MD as Referring Physician (Optometry)  I have updated your Care Teams any recent Medical Services you may have received from other providers in the past year.     Assessment:   This is a routine wellness examination for Bassett.  Hearing/Vision screen No results found.   Goals Addressed             This Visit's Progress    Patient Stated   On track    05/13/2023, wants to lose more weight       Depression Screen     05/13/2024   10:29 AM 09/23/2023    2:49 PM 09/12/2023    3:28 PM 08/07/2023   11:00 AM 05/13/2023    8:21 AM 04/25/2023   10:22 AM  11/23/2022   11:16 AM  PHQ 2/9 Scores  PHQ - 2 Score 6  5 4 2 6    PHQ- 9 Score 15  20 19 15 19       Information is confidential and restricted. Go to Review Flowsheets to unlock data.    Fall Risk      05/12/2024   10:54 PM 09/12/2023    3:28 PM 08/07/2023   11:00 AM 08/07/2023   10:59 AM 05/13/2023    3:01 AM  Fall Risk   Falls in the past year? 0 0 0 0 0  Number falls in past yr: 0    0  Injury with Fall? 0    0  Risk for fall due to :     Medication side effect;Impaired mobility  Follow up     Falls prevention discussed;Falls evaluation completed    MEDICARE RISK AT HOME:  Medicare Risk at Home Any stairs in or around the home?: (Patient-Rptd) Yes If so, are there any without handrails?: (Patient-Rptd) No Home free of loose throw rugs in walkways, pet beds, electrical cords, etc?: (Patient-Rptd) Yes Adequate lighting in your home to reduce risk of falls?: (Patient-Rptd) Yes Life alert?: (Patient-Rptd) No Use of a cane, walker or w/c?: (Patient-Rptd) No Grab bars in the bathroom?: (Patient-Rptd) Yes Shower chair or bench in shower?: (Patient-Rptd) Yes Elevated toilet seat or a handicapped toilet?: (Patient-Rptd) No  TIMED UP AND GO:  Was the test performed?  no  Cognitive Function: 6CIT completed    05/03/2020    9:11 AM  MMSE - Mini Mental State Exam  Orientation to time 5  Orientation to Place 5  Registration 3  Attention/ Calculation 5  Recall 2  Language- name 2 objects 2  Language- repeat 1  Language- follow 3 step command 3  Language- read & follow direction 1  Write a sentence 1  Copy design 1  Total score 29        05/13/2023    8:22 AM 05/09/2022    9:14 AM 05/08/2021    9:16 AM  6CIT Screen  What Year? 0 points 0 points 0 points  What month? 0 points 0 points 0 points  What time? 0 points 0 points 0 points  Count back from 20 0 points 0 points 0 points  Months in reverse 0 points 0 points 0 points  Repeat phrase 0 points 0 points 4 points  Total Score 0 points 0 points 4 points    Immunizations Immunization History  Administered Date(s) Administered   Influenza Split 06/22/2015, 07/03/2016   Influenza,inj,Quad PF,6+ Mos 06/25/2019, 06/28/2020,  06/22/2021, 07/03/2022, 07/19/2023   Influenza,trivalent, recombinat, inj, PF 07/16/2017   Tdap 11/11/2013   Zoster Recombinant(Shingrix ) 06/25/2019, 12/11/2019    Screening Tests Health Maintenance  Topic Date Due   Pneumococcal Vaccine: 50+ Years (1 of 1 - PCV) Never done   DTaP/Tdap/Td (2 - Td or Tdap) 11/12/2023   INFLUENZA VACCINE  05/01/2024   DEXA SCAN  12/22/2024   Medicare Annual Wellness (AWV)  05/13/2025   MAMMOGRAM  08/04/2025   Colonoscopy  09/08/2029   Hepatitis C Screening  Completed   HIV Screening  Completed   Zoster Vaccines- Shingrix   Completed   Hepatitis B Vaccines  Aged Out   HPV VACCINES  Aged Out   Meningococcal B Vaccine  Aged Out   COVID-19 Vaccine  Discontinued    Health Maintenance  Health Maintenance Due  Topic Date Due   Pneumococcal Vaccine: 50+ Years (  1 of 1 - PCV) Never done   DTaP/Tdap/Td (2 - Td or Tdap) 11/12/2023   INFLUENZA VACCINE  05/01/2024   Health Maintenance Items Addressed: See Nurse Notes at the end of this note  Additional Screening:  Vision Screening: Recommended annual ophthalmology exams for early detection of glaucoma and other disorders of the eye. Would you like a referral to an eye doctor? No    Dental Screening: Recommended annual dental exams for proper oral hygiene  Community Resource Referral / Chronic Care Management: CRR required this visit?  No   CCM required this visit?  No   Plan:    I have personally reviewed and noted the following in the patient's chart:   Medical and social history Use of alcohol, tobacco or illicit drugs  Current medications and supplements including opioid prescriptions. Patient is not currently taking opioid prescriptions. Functional ability and status Nutritional status Physical activity Advanced directives List of other physicians Hospitalizations, surgeries, and ER visits in previous 12 months Vitals Screenings to include cognitive, depression, and falls Referrals  and appointments  In addition, I have reviewed and discussed with patient certain preventive protocols, quality metrics, and best practice recommendations. A written personalized care plan for preventive services as well as general preventive health recommendations were provided to patient.   Ozie Ned, CMA   05/13/2024   After Visit Summary: (MyChart) Due to this being a telephonic visit, the after visit summary with patients personalized plan was offered to patient via MyChart   Notes: PCP Follow Up Recommendations: Pt is aware and due the following: pnuemonia/flu vaccine

## 2024-05-13 NOTE — Progress Notes (Signed)
 Complete physical exam  Patient: Julia [name]   DOB: 07-24-1961   63 y.o. Female  MRN: [id number]  Subjective:    Chief Complaint  Patient presents with   Annual Exam    BEAUTY PLESS is a 63 y.o. female who presents today for a complete physical exam. She reports consuming a general diet. The patient does not participate in regular exercise at present. She generally feels fairly well. She reports sleeping fairly well. She does have additional problems to discuss today.   She experiences ongoing issues with her left knee following a knee replacement surgery. The knee remains tight and painful, particularly at night, affecting her sleep. She has difficulty bending it upon waking and notes a clicking sound with every step, with discomfort primarily at the back of the knee. Physical therapy was discontinued, and she continues to use a pillow for stretching exercises every other day. She remains active around the house, especially while caring for her grandchild.  She experiences episodes of excessive sweating and heat intolerance, both indoors and outdoors, significantly impacting her daily activities. She requires air conditioning and fans to manage these symptoms. Additionally, she is frequently bitten by mosquitoes despite trying various remedies.  She has a history of anxiety and depression and is still attending behavioral health sessions, with an upcoming appointment with her provider Hadassah. There have been no recent changes in her medications for these conditions.  She experiences constipation and is taking a probiotic, Bioma, to manage it. She is attempting to lose weight but finds it challenging as she continues to gain weight.  She has arthritis in both hands, especially her thumbs. At night, she uses Salonpas patches and arthritis gloves for relief, though the effectiveness varies. Frequent hand washing prevents her from using the patches during the day.  Her sleep is disrupted  by the need to urinate multiple times at night. She takes trazodone , half a pill, on nights when she does not need to wake up early, but it takes a few hours to induce sleep.  She reports tinnitus, which has persisted for ten years, and finds it difficult to sleep in a quiet and dark environment due to this condition. She is also a fall risk due to peripheral vision issues.  She continues to take atorvastatin  for cholesterol management without significant side effects. She has stopped taking vitamin B12 but continues with vitamin D . She reports cold feet at night despite the rest of her body feeling hot.  She takes gabapentin  twice daily and meloxicam  15 mg daily. She has concerns about kidney function related to meloxicam  use, which has been monitored in the past.       Most recent fall risk assessment:    05/12/2024   10:54 PM  Fall Risk   Falls in the past year? 0  Number falls in past yr: 0  Injury with Fall? 0     Most recent depression screenings:    05/13/2024   10:29 AM 09/23/2023    2:49 PM  PHQ 2/9 Scores  PHQ - 2 Score 6   PHQ- 9 Score 15      Information is confidential and restricted. Go to Review Flowsheets to unlock data.    Vision:Within last year and Dental: No current dental problems  Patient Active Problem List   Diagnosis Date Noted   History of left knee replacement 11/06/2023   MDD (major depressive disorder), recurrent episode (HCC) 08/02/2022   Osteoarthritis of left knee 06/23/2022  Osteoarthritis of right acromioclavicular joint 12/07/2021   Arthritis 02/12/2021   Prediabetes 02/12/2021   Mixed stress and urge urinary incontinence 02/12/2021   Obesity (BMI 30.0-34.9) 02/12/2021   Vitamin D  insufficiency 02/10/2021   Vitamin B12 deficiency 02/10/2021   COVID-19 long hauler 10/13/2020   Neuropathy 08/22/2020   Chronic pain 02/10/2016   Paget disease of bone 12/28/2015   Esophageal reflux 08/15/2015   Mixed hyperlipidemia 08/15/2015   Chronic  pain in shoulder 08/15/2015   Anxiety and depression 11/25/2013   GAD (generalized anxiety disorder) 11/25/2013   IBS (irritable bowel syndrome) 01/02/2013   Past Medical History:  Diagnosis Date   Anxiety 2015   Arthritis 2013   Complication of anesthesia    slow to wake up-does not take much medicine to sedate her   Depression 2015   Esophageal reflux    Hyperlipidemia    Low ferritin 02/10/2021   Paget disease of bone    Pneumonia due to COVID-19 virus 05/20/2020   Vitamin B12 deficiency 02/10/2021   Vitamin D  insufficiency 02/10/2021   Wears glasses    Past Surgical History:  Procedure Laterality Date   ANTERIOR FUSION CERVICAL SPINE  10/2019   C3-C5   BREAST CYST EXCISION Left    BREAST SURGERY  2000   Small cyst removed   CERVICAL SPINE SURGERY  10/2011   CESAREAN SECTION  1983   Also 1988 1989   DILATION AND CURETTAGE OF UTERUS  1986   JOINT REPLACEMENT  Jan 2025   Total knee replacement   KNEE ARTHROSCOPY Left 07/22/2013   Procedure: LEFT KNEE ARTHROSCOPY WITH DEBRIDEMENT CHONDROPLASTY, REMOVAL LOOSE BODIES, PARTIAL MEDIAL MENISCECTOMY, OPEN EXCISION OF PES GANGLION;  Surgeon: Dempsey JINNY Sensor, MD;  Location: [facility];  Service: Orthopedics;  Laterality: Left;  NO SPECIMEN SENT PER DR. SENSOR ORDER.   KNEE SURGERY Left 11/21/2021   REPLACEMENT TOTAL KNEE Left 10/15/2023   SPINE SURGERY  10/26/2019   Also 2012   TOTAL ABDOMINAL HYSTERECTOMY  05/2010   TAH   TUBAL LIGATION  1989   Social History   Tobacco Use   Smoking status: Former    Current packs/day: 0.00    Average packs/day: 0.2 packs/day for 6.8 years (1.2 ttl pk-yrs)    Types: Cigarettes    Start date: 01/03/1980    Quit date: 01/02/1985    Years since quitting: 39.3   Smokeless tobacco: Never   Tobacco comments:    Was a light smoker a pack would last a few days  Vaping Use   Vaping status: Never Used  Substance Use Topics   Alcohol use: Not Currently    Alcohol/week: 1.0 standard  drink of alcohol    Types: 1 Glasses of wine per week    Comment: occassional   Drug use: No   Social History   Socioeconomic History   Marital status: Significant Other    Spouse name: Evalene Finder   Number of children: 3   Years of education: Not on file   Highest education level: 12th grade  Occupational History   Occupation: disability  Tobacco Use   Smoking status: Former    Current packs/day: 0.00    Average packs/day: 0.2 packs/day for 6.8 years (1.2 ttl pk-yrs)    Types: Cigarettes    Start date: 01/03/1980    Quit date: 01/02/1985    Years since quitting: 39.3   Smokeless tobacco: Never   Tobacco comments:    Was a light smoker a pack  would last a few days  Vaping Use   Vaping status: Never Used  Substance and Sexual Activity   Alcohol use: Not Currently    Alcohol/week: 1.0 standard drink of alcohol    Types: 1 Glasses of wine per week    Comment: occassional   Drug use: No   Sexual activity: Not Currently    Birth control/protection: Abstinence  Other Topics Concern   Not on file  Social History Narrative   2 sons live nearby, one son in [place]     05/09/2022 - Has 3 grandchildren - one almost a year old   Social Drivers of Corporate investment banker Strain: Low Risk  (05/12/2024)   Overall Financial Resource Strain (CARDIA)    Difficulty of Paying Living Expenses: Not very hard  Food Insecurity: No Food Insecurity (05/12/2024)   Hunger Vital Sign    Worried About Running Out of Food in the Last Year: Never true    Ran Out of Food in the Last Year: Never true  Transportation Needs: No Transportation Needs (05/12/2024)   PRAPARE - Administrator, Civil Service (Medical): No    Lack of Transportation (Non-Medical): No  Physical Activity: Unknown (05/12/2024)   Exercise Vital Sign    Days of Exercise per Week: Patient declined    Minutes of Exercise per Session: Not on file  Stress: Stress Concern Present (05/12/2024)   [employer] of  Occupational Health - Occupational Stress Questionnaire    Feeling of Stress: Very much  Social Connections: Socially Isolated (05/12/2024)   Social Connection and Isolation Panel    Frequency of Communication with Friends and Family: Once a week    Frequency of Social Gatherings with Friends and Family: Once a week    Attends Religious Services: Patient declined    Database administrator or Organizations: No    Attends Engineer, structural: Not on file    Marital Status: Divorced  Intimate Partner Violence: Not At Risk (05/13/2024)   Humiliation, Afraid, Rape, and Kick questionnaire    Fear of Current or Ex-Partner: No    Emotionally Abused: No    Physically Abused: No    Sexually Abused: No   Family Status  Relation Name Status   Mother Clotilde Mungo Deceased   Father Georgette Mungo Alive   Sister  Deceased   Sister Apolinar Cy Eck (Not Specified)   Mat Aunt  (Not Specified)   Brother Investment banker, corporate Alive   Brother Tom Alive   Brother Acupuncturist Alive   Brother  Alive   Son  Alive   Son  Alive   Son  Alive  No partnership data on file   Family History  Problem Relation Age of Onset   Thyroid  disease Mother    Congestive Heart Failure Mother    Heart disease Mother    Arthritis Mother    Hyperlipidemia Mother    Miscarriages / Stillbirths Mother    Heart disease Father    Hyperlipidemia Father    Lung cancer Sister    Melanoma Sister    Cancer Sister    Depression Sister    Anxiety disorder Sister    Arthritis Sister    Hyperlipidemia Sister    Varicose Veins Sister    Breast cancer Maternal Aunt    Alcohol abuse Brother    Alcohol abuse Brother    Alcohol abuse Brother    Healthy Brother    Allergies  Allergen Reactions   Lamotrigine Itching  and Swelling      Patient Care Team: Severa Rock HERO, FNP as PCP - General (Family Medicine) Merrily Brooklyn (Psychiatry) Powers, Marsa POUR, MD as Referring Physician (Neurosurgery) Mai Lynwood FALCON, MD as Consulting  Physician (Rheumatology) Nahser, Aleene PARAS, MD (Inactive) as Consulting Physician (Cardiology) Kassie Acquanetta Bradley, MD as Consulting Physician (Pulmonary Disease) Ladora Ross Lacy Phebe, MD as Referring Physician [facility])   Outpatient Medications Prior to Visit  Medication Sig   buPROPion  (WELLBUTRIN  XL) 300 MG 24 hr tablet Take 1 tablet (300 mg total) by mouth daily.   Cholecalciferol (VITAMIN D3) 50 MCG (2000 UT) capsule Take 2,000 Units by mouth daily.   DULoxetine  (CYMBALTA ) 60 MG capsule Take 1 capsule (60 mg total) by mouth 2 (two) times daily.   traZODone  (DESYREL ) 50 MG tablet Take 0.5 tablets (25 mg total) by mouth at bedtime.   [DISCONTINUED] atorvastatin  (LIPITOR) 40 MG tablet TAKE ONE TABLET EVERY EVENING   [DISCONTINUED] gabapentin  (NEURONTIN ) 300 MG capsule TAKE ONE CAPSULE TWICE DAILY   [DISCONTINUED] meloxicam  (MOBIC ) 15 MG tablet TAKE ONE TABLET DAILY   [DISCONTINUED] aspirin  81 MG chewable tablet 81 mg.   No facility-administered medications prior to visit.    ROS per HPI      Objective:     BP 126/72   Pulse 85   Temp (!) 97.1 F (36.2 C)   Ht 5' 1 (1.549 m)   Wt 164 lb 9.6 oz (74.7 kg)   LMP 01/02/2010   SpO2 96%   BMI 31.10 kg/m  BP Readings from Last 3 Encounters:  05/13/24 126/72  05/13/24 102/73  11/06/23 102/73   Wt Readings from Last 3 Encounters:  05/13/24 164 lb 9.6 oz (74.7 kg)  05/13/24 162 lb (73.5 kg)  11/06/23 162 lb (73.5 kg)   SpO2 Readings from Last 3 Encounters:  05/13/24 96%  11/06/23 99%  09/12/23 98%      Physical Exam Vitals and nursing note reviewed.  Constitutional:      General: She is not in acute distress.    Appearance: Normal appearance. She is well-developed and well-groomed. She is obese. She is not ill-appearing, toxic-appearing or diaphoretic.  HENT:     Head: Normocephalic and atraumatic.     Jaw: There is normal jaw occlusion.     Right Ear: Hearing normal. A middle ear effusion is present. Tympanic membrane  is not erythematous.     Left Ear: Hearing normal. A middle ear effusion is present. Tympanic membrane is not erythematous.     Nose: Nose normal.     Mouth/Throat:     Lips: Pink.     Mouth: Mucous membranes are moist.     Pharynx: Oropharynx is clear. Uvula midline.  Eyes:     General: Lids are normal.     Extraocular Movements: Extraocular movements intact.     Conjunctiva/sclera: Conjunctivae normal.     Pupils: Pupils are equal, round, and reactive to light.  Neck:     Vascular: No carotid bruit or JVD.     Trachea: Trachea and phonation normal.  Cardiovascular:     Rate and Rhythm: Normal rate and regular rhythm.     Chest Wall: PMI is not displaced.     Pulses: Normal pulses.     Heart sounds: Normal heart sounds. No murmur heard.    No friction rub. No gallop.  Pulmonary:     Effort: Pulmonary effort is normal. No respiratory distress.     Breath sounds: Normal breath  sounds. No wheezing.  Abdominal:     General: Bowel sounds are normal. There is no distension or abdominal bruit.     Palpations: Abdomen is soft. There is no hepatomegaly or splenomegaly.     Tenderness: There is no abdominal tenderness. There is no right CVA tenderness or left CVA tenderness.     Hernia: No hernia is present.  Musculoskeletal:        General: Normal range of motion.     Cervical back: Normal range of motion and neck supple.     Right lower leg: No edema.     Left lower leg: No edema.       Legs:  Lymphadenopathy:     Cervical: No cervical adenopathy.  Skin:    General: Skin is warm and dry.     Capillary Refill: Capillary refill takes less than 2 seconds.     Coloration: Skin is not cyanotic, jaundiced or pale.     Findings: No rash.  Neurological:     General: No focal deficit present.     Mental Status: She is alert and oriented to person, place, and time.     Sensory: Sensation is intact.     Motor: Motor function is intact.     Coordination: Coordination is intact.      Gait: Gait is intact.     Deep Tendon Reflexes: Reflexes are normal and symmetric.  Psychiatric:        Attention and Perception: Attention and perception normal.        Mood and Affect: Mood and affect normal.        Speech: Speech normal.        Behavior: Behavior normal. Behavior is cooperative.        Thought Content: Thought content normal.        Cognition and Memory: Cognition and memory normal.        Judgment: Judgment normal.       Last CBC Lab Results  Component Value Date   WBC 6.7 09/09/2023   HGB 13.7 09/09/2023   HCT 42.6 09/09/2023   MCV 95 09/09/2023   MCH 30.5 09/09/2023   RDW 12.4 09/09/2023   PLT 249 09/09/2023   Last metabolic panel Lab Results  Component Value Date   GLUCOSE 99 11/06/2023   NA 142 11/06/2023   K 4.9 11/06/2023   CL 104 11/06/2023   CO2 19 (L) 11/06/2023   BUN 14 11/06/2023   CREATININE 1.01 (H) 11/06/2023   EGFR 63 11/06/2023   CALCIUM  10.0 11/06/2023   PHOS 4.1 05/29/2020   PROT 7.0 11/06/2023   ALBUMIN 4.5 11/06/2023   LABGLOB 2.5 11/06/2023   AGRATIO 2.2 07/03/2022   BILITOT 0.6 11/06/2023   ALKPHOS 112 11/06/2023   AST 19 11/06/2023   ALT 16 11/06/2023   ANIONGAP 11 05/29/2020   Last lipids Lab Results  Component Value Date   CHOL 178 09/09/2023   HDL 65 09/09/2023   LDLCALC 98 09/09/2023   TRIG 82 09/09/2023   CHOLHDL 2.7 09/09/2023   Last hemoglobin A1c Lab Results  Component Value Date   HGBA1C 5.4 09/09/2023   Last thyroid  functions Lab Results  Component Value Date   TSH 1.880 04/25/2023   T4TOTAL 7.9 04/25/2023   Last vitamin D  Lab Results  Component Value Date   VD25OH 35.0 09/09/2023   Last vitamin B12 and Folate Lab Results  Component Value Date   VITAMINB12 336 11/06/2023   FOLATE 5.7 04/25/2023  Assessment & Plan:    Routine Health Maintenance and Physical Exam  Immunization History  Administered Date(s) Administered   Influenza Split 06/22/2015, 07/03/2016    Influenza,inj,Quad PF,6+ Mos 06/25/2019, 06/28/2020, 06/22/2021, 07/03/2022, 07/19/2023   Influenza,trivalent, recombinat, inj, PF 07/16/2017   PNEUMOCOCCAL CONJUGATE-20 05/13/2024   Tdap 11/11/2013   Zoster Recombinant(Shingrix ) 06/25/2019, 12/11/2019    Health Maintenance  Topic Date Due   INFLUENZA VACCINE  12/29/2024 (Originally 05/01/2024)   DTaP/Tdap/Td (2 - Td or Tdap) 05/13/2025 (Originally 11/12/2023)   DEXA SCAN  12/22/2024   Medicare Annual Wellness (AWV)  05/13/2025   MAMMOGRAM  08/04/2025   Colonoscopy  09/08/2029   Pneumococcal Vaccine: 50+ Years  Completed   Hepatitis C Screening  Completed   HIV Screening  Completed   Zoster Vaccines- Shingrix   Completed   Pneumococcal Vaccine: 19-49 Years  Aged Out   Hepatitis B Vaccines  Aged Out   HPV VACCINES  Aged Out   Meningococcal B Vaccine  Aged Out   COVID-19 Vaccine  Discontinued    Discussed health benefits of physical activity, and encouraged her to engage in regular exercise appropriate for her age and condition.  Problem List Items Addressed This Visit       Nervous and Auditory   Neuropathy   Relevant Medications   gabapentin  (NEURONTIN ) 300 MG capsule   Other Relevant Orders   CMP14+EGFR   CBC with Differential/Platelet   Vitamin B12     Musculoskeletal and Integument   Arthritis   Relevant Medications   gabapentin  (NEURONTIN ) 300 MG capsule   meloxicam  (MOBIC ) 15 MG tablet   Other Relevant Orders   CMP14+EGFR   Vitamin B12     Other   GAD (generalized anxiety disorder)   Relevant Orders   Vitamin D , 25-hydroxy   Vitamin B12   Thyroid  Panel With TSH   Mixed hyperlipidemia   Relevant Medications   atorvastatin  (LIPITOR) 40 MG tablet   Other Relevant Orders   CMP14+EGFR   Vitamin D  insufficiency   Relevant Orders   CMP14+EGFR   Vitamin D , 25-hydroxy   Vitamin B12 deficiency   Relevant Orders   CBC with Differential/Platelet   Vitamin B12   Obesity (BMI 30.0-34.9)   Relevant Orders    CMP14+EGFR   CBC with Differential/Platelet   Lipid panel   Vitamin D , 25-hydroxy   Vitamin B12   Thyroid  Panel With TSH   Bayer DCA Hb A1c Waived   MDD (major depressive disorder), recurrent episode (HCC)   Relevant Orders   Vitamin D , 25-hydroxy   Vitamin B12   Thyroid  Panel With TSH   Other Visit Diagnoses       Annual physical exam    -  Primary   Relevant Orders   CMP14+EGFR   CBC with Differential/Platelet   Lipid panel   MM 3D SCREENING MAMMOGRAM BILATERAL BREAST     Heat intolerance       Relevant Orders   Thyroid  Panel With TSH     Encounter for screening mammogram for malignant neoplasm of breast       Relevant Orders   MM 3D SCREENING MAMMOGRAM BILATERAL BREAST     Non-recurrent acute serous otitis media of both ears       Relevant Medications   loratadine  (CLARITIN ) 10 MG tablet     Immunization due       Relevant Orders   Pneumococcal conjugate vaccine 20-valent (Prevnar 20) (Completed)         Left knee pain  after knee replacement Persistent left knee pain with tightness and clicking, particularly at night, causing difficulty sleeping and bending the knee. Possible hardware issue suggested by clicking sound with movement. - Advise contacting orthopedic surgeon for re-evaluation and possible imaging.  Bilateral hand osteoarthritis Osteoarthritis in both hands causing loss of strength and pain, particularly at night. - Continue use of Salonpas patches and arthritis gloves as needed.  Chronic pain in both feet Chronic pain in feet, described as cold and swollen at night. - Refill gabapentin  prescription.  Chronic insomnia Chronic insomnia managed with trazodone . She takes half a pill on nights when she does not need to wake up early.  Anxiety and depression Ongoing anxiety and depression. She is not currently attending behavioral health sessions but has an upcoming appointment with Hadassah.  Obesity She is attempting weight loss but reports difficulty.  Discussed potential use of weight loss medications contingent on diet and exercise efforts over an 8-week period. Emphasized the importance of lifestyle changes to sustain weight loss beyond medication use. - Encourage use of My Fitness Pal app for tracking diet and exercise. - Schedule follow-up in 8 weeks to assess weight management progress.  Constipation Intermittent constipation managed with probiotics. She is using Bioma probiotic.  Tinnitus and middle ear effusion Chronic tinnitus with a possible middle ear effusion due to eustachian tube dysfunction. - Recommend nightly Claritin  to help with eustachian tube drainage.  Hyperhidrosis Excessive sweating, particularly during activities, causing significant discomfort and difficulty managing symptoms. - Check thyroid  function as part of lab work.  Dyslipidemia She is on atorvastatin  for cholesterol management.  Chronic kidney disease, unspecified stage Chronic kidney disease with previous concerns about kidney function affecting medication choices. Monitoring renal function is necessary. - Check renal function as part of lab work.  Adult Wellness Visit Routine adult wellness visit to assess overall health and update necessary screenings and vaccinations. - Order mammogram. - Administer pneumonia vaccine. - Update lab work including thyroid  function, renal function, and A1c.       Return in about 8 weeks (around 07/08/2024), or if symptoms worsen or fail to improve, for BMI.     Rosaline Bruns, FNP

## 2024-05-14 ENCOUNTER — Ambulatory Visit: Payer: Self-pay | Admitting: Family Medicine

## 2024-05-14 LAB — CBC WITH DIFFERENTIAL/PLATELET
Basophils Absolute: 0 x10E3/uL (ref 0.0–0.2)
Basos: 1 %
EOS (ABSOLUTE): 0.2 x10E3/uL (ref 0.0–0.4)
Eos: 3 %
Hematocrit: 43.7 % (ref 34.0–46.6)
Hemoglobin: 14 g/dL (ref 11.1–15.9)
Immature Grans (Abs): 0 x10E3/uL (ref 0.0–0.1)
Immature Granulocytes: 0 %
Lymphocytes Absolute: 1.1 x10E3/uL (ref 0.7–3.1)
Lymphs: 20 %
MCH: 31.1 pg (ref 26.6–33.0)
MCHC: 32 g/dL (ref 31.5–35.7)
MCV: 97 fL (ref 79–97)
Monocytes Absolute: 0.3 x10E3/uL (ref 0.1–0.9)
Monocytes: 5 %
Neutrophils Absolute: 4 x10E3/uL (ref 1.4–7.0)
Neutrophils: 71 %
Platelets: 254 x10E3/uL (ref 150–450)
RBC: 4.5 x10E6/uL (ref 3.77–5.28)
RDW: 12.4 % (ref 11.7–15.4)
WBC: 5.6 x10E3/uL (ref 3.4–10.8)

## 2024-05-14 LAB — THYROID PANEL WITH TSH
Free Thyroxine Index: 1.9 (ref 1.2–4.9)
T3 Uptake Ratio: 26 % (ref 24–39)
T4, Total: 7.2 ug/dL (ref 4.5–12.0)
TSH: 1.49 u[IU]/mL (ref 0.450–4.500)

## 2024-05-14 LAB — CMP14+EGFR
ALT: 13 IU/L (ref 0–32)
AST: 18 IU/L (ref 0–40)
Albumin: 4.4 g/dL (ref 3.9–4.9)
Alkaline Phosphatase: 100 IU/L (ref 44–121)
BUN/Creatinine Ratio: 18 (ref 12–28)
BUN: 18 mg/dL (ref 8–27)
Bilirubin Total: 0.6 mg/dL (ref 0.0–1.2)
CO2: 23 mmol/L (ref 20–29)
Calcium: 9.3 mg/dL (ref 8.7–10.3)
Chloride: 104 mmol/L (ref 96–106)
Creatinine, Ser: 0.98 mg/dL (ref 0.57–1.00)
Globulin, Total: 2.1 g/dL (ref 1.5–4.5)
Glucose: 105 mg/dL — ABNORMAL HIGH (ref 70–99)
Potassium: 5 mmol/L (ref 3.5–5.2)
Sodium: 139 mmol/L (ref 134–144)
Total Protein: 6.5 g/dL (ref 6.0–8.5)
eGFR: 65 mL/min/1.73 (ref >59)

## 2024-05-14 LAB — LIPID PANEL
Chol/HDL Ratio: 2.4 ratio (ref 0.0–4.4)
Cholesterol, Total: 158 mg/dL (ref 100–199)
HDL: 65 mg/dL (ref >39)
LDL Chol Calc (NIH): 79 mg/dL (ref 0–99)
Triglycerides: 70 mg/dL (ref 0–149)
VLDL Cholesterol Cal: 14 mg/dL (ref 5–40)

## 2024-05-14 LAB — VITAMIN B12: Vitamin B-12: 293 pg/mL (ref 232–1245)

## 2024-05-14 LAB — VITAMIN D 25 HYDROXY (VIT D DEFICIENCY, FRACTURES): Vit D, 25-Hydroxy: 31.3 ng/mL (ref 30.0–100.0)

## 2024-05-20 ENCOUNTER — Encounter (HOSPITAL_COMMUNITY): Payer: Self-pay | Admitting: Clinical

## 2024-05-20 ENCOUNTER — Ambulatory Visit (HOSPITAL_COMMUNITY): Admitting: Clinical

## 2024-05-20 DIAGNOSIS — F331 Major depressive disorder, recurrent, moderate: Secondary | ICD-10-CM

## 2024-05-20 DIAGNOSIS — F411 Generalized anxiety disorder: Secondary | ICD-10-CM | POA: Diagnosis not present

## 2024-05-20 NOTE — Progress Notes (Signed)
 THERAPIST PROGRESS NOTE  Session Time: 10:00am-11:00am   Session #22  Participation Level: Active  Behavioral Response: Casual Alert Depressed and Tearful  Type of Therapy: Individual Therapy  Goals addressed:  LTG: Identify and decrease cognitive distortions contributing negatively to mood and behavior by identifying 5-7 cognitive distortions Julia [name] has and learning how to come up with replacement thoughts that are more balanced, realistic, and helpful.  STG: Learn a variety of coping skills and demonstrate the ability to use them to decrease feelings of sadness, anger, and fear and increase feelings of happiness, peace, and powerfulness AEB gauging those emotions on 1-10 scale.  Julia [name] will increase her resilience through application of CBT techniques and through processing of her life in a shame framework  LTG: Learn about boundary types, how to implement them, and how to enforce them so that Julia [name] feels more empowered and content with being able to control her own life where/when possible.  LTG: Learn breathing techniques and grounding techniques at an age-appropriate level and demonstrate mastery in session then report independent use of these skills out of session.  STG: Score less than 9 on the PHQ-9 and less than 5 on the GAD-7 as evidenced by intermittent administration of the questionnaires to determine progress in managing depression and anxiety.  LTG: Improve self-esteem by engaging in daily affirmations, developing new skills, gratitude journaling, use of SMART goals, increased assertiveness, challenging negative beliefs, and focusing on what patient can control    LTG: Work to Arts development officer from models like CBT, Stages of Change, DBT, shame resilience theory, ACT, SFBT, MI, trauma-informed therapy and others to be able to manage mental health symptoms, AEB practicing out of session and reporting back.   Learn and practice communication techniques such as active listening, I  statements, open-ended questions, reflective listening, assertiveness, fair fighting rules, initiating conversations, and more as necessary and taught in session  ProgressTowards Goals: Progressing  Interventions: Supportive   Summary: Julia [name] is a 63 y.o. female who presents with anxiety and depression for therapy.  She presented oriented x5 and stated she was feelingstill upset since our last session.  CSW evaluated patient's medication compliance, use of coping tools, and self-care, as applicable.  She provided an update on various aspects of her life that are normally discussed in therapy, including her granddaughter's 3-year birthday party, various ways in which partner has upset her recently, and how she has felt left out multiple times by her children who get together without her present.  She narrated a lengthy story with the details about her granddaughter's birthday party and every time CSW tried to redirect or move the discussion along, she said I'll get to that.  At the end of the session, she ended up leaving in tears, having had only 15 minutes to process her feelings.  CSW informed her that CSW would like to start working on her feelings sooner in the next session.  She disclosed that her partner is spending every evening with his son who has moved next door, especially since son's wife and child moved out following an argument.  They go out to eat frequently and she feels she is not invited, even though she also did report that he will ask her if she wants something to eat.  She takes his wording to mean that he would bring something home for her as opposed to this being an invitation to go eat with them.  She found out that her two local sons, their significant  others, and their children have gotten together a few times and enjoyed each other's company, without including an invitation to her.  They only live about 15 minutes away from her and she feels left out after not being  to their house in over a year.  She was very tearful in recounting this, as well as when she shared that she was unable to Facetime with her son in [place]  for his birthday because she was still too upset about him not wanting her to go on a cruise with the 3 families.  She reported that there is no communication with her children because they do not call her, where her partner talks every day to his children constantly, even when he comes home and she has been home alone all day, wants to talk to him.  She identified what she wants which is to be included in invitations to her children's events and for her husband to disconnect from his phone and pay attention to her when he comes home.  She has started telling them these things, but has not been doing this for long.  We did not have the time to explore her efforts, if she is using I statements or is presenting the ideas in a blaming format.  Again, this is to be addressed at start of next session.  Suicidal/Homicidal: No without intent/plan  Therapist Response:  Patient is progressing AEB engaging in scheduled therapy session.  Throughout the session, CSW gave patient the opportunity to explore thoughts and feelings associated with current life situations and past/present stressors.   CSW challenged patient gently and appropriately to consider different ways of looking at reported issues. CSW encouraged patient's expression of feelings and validated these using empathy, active listening, open body language, and unconditional positive regard.      Plan/Recommendations:  Return to next scheduled appointment on 9/20, at next session start discussing her feelings earlier rather than details of events  Diagnosis:  Moderate episode of recurrent major depressive disorder (HCC)  GAD (generalized anxiety disorder)  Collaboration of Care: Psychiatrist AEB -  psychiatrist can read therapy notes; therapist can and does read psychiatric notes prior to sessions    Patient/Guardian was advised Release of Information must be obtained prior to any record release in order to collaborate their care with an outside provider. Patient/Guardian was advised if they have not already done so to contact the registration department to sign all necessary forms in order for us  to release information regarding their care.   Consent: Patient/Guardian gives verbal consent for treatment and assignment of benefits for services provided during this visit. Patient/Guardian expressed understanding and agreed to proceed.        Elgie JINNY Crest, LCSW 05/20/2024
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
## 2024-05-29 ENCOUNTER — Encounter (HOSPITAL_COMMUNITY): Payer: Self-pay | Admitting: Psychiatry

## 2024-05-29 ENCOUNTER — Telehealth (HOSPITAL_COMMUNITY): Admitting: Psychiatry

## 2024-05-29 DIAGNOSIS — F331 Major depressive disorder, recurrent, moderate: Secondary | ICD-10-CM | POA: Diagnosis not present

## 2024-05-29 DIAGNOSIS — F411 Generalized anxiety disorder: Secondary | ICD-10-CM

## 2024-05-29 MED ORDER — BUPROPION HCL ER (XL) 300 MG PO TB24: 300.0000 mg | ORAL_TABLET | Freq: Every day | ORAL | 1 refills | Status: DC

## 2024-05-29 MED ORDER — TRAZODONE HCL 50 MG PO TABS: 25.0000 mg | ORAL_TABLET | Freq: Every day | ORAL | 1 refills | Status: DC

## 2024-05-29 MED ORDER — DULOXETINE HCL 60 MG PO CPEP: 60.0000 mg | ORAL_CAPSULE | Freq: Two times a day (BID) | ORAL | 0 refills | Status: DC

## 2024-06-10 ENCOUNTER — Ambulatory Visit (HOSPITAL_COMMUNITY): Admitting: Clinical

## 2024-06-10 ENCOUNTER — Encounter (HOSPITAL_COMMUNITY): Payer: Self-pay | Admitting: Clinical

## 2024-06-10 DIAGNOSIS — F411 Generalized anxiety disorder: Secondary | ICD-10-CM

## 2024-06-10 DIAGNOSIS — F331 Major depressive disorder, recurrent, moderate: Secondary | ICD-10-CM | POA: Diagnosis not present

## 2024-06-10 NOTE — Progress Notes (Signed)
 THERAPIST PROGRESS NOTE  Session Time: 9:00am-10:00am   Session #23  Participation Level: Active  Behavioral Response: Casual Alert Negative and Tearful  Type of Therapy: Individual Therapy  Goals addressed:  LTG: Identify and decrease cognitive distortions contributing negatively to mood and behavior by identifying 5-7 cognitive distortions Julia [name] has and learning how to come up with replacement thoughts that are more balanced, realistic, and helpful.  STG: Learn a variety of coping skills and demonstrate the ability to use them to decrease feelings of sadness, anger, and fear and increase feelings of happiness, peace, and powerfulness AEB gauging those emotions on 1-10 scale.  Julia [name] will increase her resilience through application of CBT techniques and through processing of her life in a shame framework  LTG: Learn about boundary types, how to implement them, and how to enforce them so that Julia [name] feels more empowered and content with being able to control her own life where/when possible.  LTG: Learn breathing techniques and grounding techniques at an age-appropriate level and demonstrate mastery in session then report independent use of these skills out of session.  STG: Score less than 9 on the PHQ-9 and less than 5 on the GAD-7 as evidenced by intermittent administration of the questionnaires to determine progress in managing depression and anxiety.  LTG: Improve self-esteem by engaging in daily affirmations, developing new skills, gratitude journaling, use of SMART goals, increased assertiveness, challenging negative beliefs, and focusing on what patient can control    LTG: Work to Arts development officer from models like CBT, Stages of Change, DBT, shame resilience theory, ACT, SFBT, MI, trauma-informed therapy and others to be able to manage mental health symptoms, AEB practicing out of session and reporting back.   Learn and practice communication techniques such as active listening, I  statements, open-ended questions, reflective listening, assertiveness, fair fighting rules, initiating conversations, and more as necessary and taught in session  ProgressTowards Goals: Progressing  Interventions: Supportive, Reframing, and Other: shame work   Summary: Julia [name] is a 63 y.o. female who presents with anxiety and depression for therapy.  She presented oriented x5 and stated she was feeling a lot of pain.  CSW evaluated patient's medication compliance, use of coping tools, and self-care, as applicable.  She provided an update on various aspects of her life that are normally discussed in therapy, including her pain, her irritations with partner, and her feelings of failing herself.  CSW made it clear from the start that we needed to use the session to process her feelings rather than her tasks, so this was done throughout session with positive effect, although it did result in her crying at times.  One observation made by CSW was that patient appears to be the pain bank of many people in her life, in that people share their negative with her but not their positive.  One such person is her daughter-in-law, who complains consistently about patient's son, whom patient feels is just like patient's partner.  As she described her daughter-in-law, CSW asked if this individual is just like patient, to which she answered yes.  She delved frequently into feelings that her mother influenced her throughout her life by always telling her she needed to do more, was not enough, and such, to the extent that 10 years later she still hears her mother's voice saying these things.  When she looks in the mirror, she looks so much like mother that she feels mom continues to harangue her.  As we discussed her tendency to  be a human doing as opposed to a human being, CSW observed that nobody is standing over her with a gun at her head making her do things (the point being that she is the one making the  choice), and she responded that her mother is in fact standing over her with a gun all the time.  We explored what this means to her.  CSW asked if she could possibly substitute wording, so that instead of saying to herself, I hate that I have to do.... she would say I hate that I do....  She was not sure she could do this and acknowledged that she feels she has failed herself every single day because of the way her body hurts all the time.  We explored this from a shame perspective.  She also recognized that she compensates for not feeling she did not enough for her children when she was a single mother, even though she tried hard. She also recognized that the same issue affects her partner who was not present for his children's childhoods, so now he is constantly present for them.  She purchased a book entitled The ABC's of Self-Love and also gave a copy to her daughter-in-law, has not started it yet.  CSW encouraged her to go very slowly with it in order to gain full impact.  Suicidal/Homicidal: No without intent/plan  Therapist Response:  Patient is progressing AEB engaging in scheduled therapy session.  Throughout the session, CSW gave patient the opportunity to explore thoughts and feelings associated with current life situations and past/present stressors.   CSW challenged patient gently and appropriately to consider different ways of looking at reported issues. CSW encouraged patient's expression of feelings and validated these using empathy, active listening, open body language, and unconditional positive regard.      Plan/Recommendations:  Return to next scheduled appointment, do homework of reading at least A in her ABC's of Self-Love book and tell herself when looks in mirror that she is not her mother, report back on experience at next session.  Diagnosis:  Moderate episode of recurrent major depressive disorder (HCC)  GAD (generalized anxiety disorder)  Collaboration of Care:  Psychiatrist AEB -  psychiatrist can read therapy notes; therapist can and does read psychiatric notes prior to sessions   Patient/Guardian was advised Release of Information must be obtained prior to any record release in order to collaborate their care with an outside provider. Patient/Guardian was advised if they have not already done so to contact the registration department to sign all necessary forms in order for us  to release information regarding their care.   Consent: Patient/Guardian gives verbal consent for treatment and assignment of benefits for services provided during this visit. Patient/Guardian expressed understanding and agreed to proceed.        Elgie JINNY Crest, LCSW 06/10/2024

## 2024-07-08 ENCOUNTER — Ambulatory Visit: Admitting: Family Medicine
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
## 2024-07-22 ENCOUNTER — Encounter (HOSPITAL_COMMUNITY): Payer: Self-pay | Admitting: Clinical

## 2024-07-22 ENCOUNTER — Ambulatory Visit (HOSPITAL_COMMUNITY): Admitting: Clinical

## 2024-07-22 DIAGNOSIS — F331 Major depressive disorder, recurrent, moderate: Secondary | ICD-10-CM

## 2024-07-22 DIAGNOSIS — F411 Generalized anxiety disorder: Secondary | ICD-10-CM | POA: Diagnosis not present
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
## 2024-07-24 ENCOUNTER — Telehealth (HOSPITAL_BASED_OUTPATIENT_CLINIC_OR_DEPARTMENT_OTHER): Admitting: Psychiatry

## 2024-07-24 ENCOUNTER — Encounter (HOSPITAL_COMMUNITY): Payer: Self-pay | Admitting: Psychiatry

## 2024-07-24 DIAGNOSIS — F411 Generalized anxiety disorder: Secondary | ICD-10-CM | POA: Diagnosis not present

## 2024-07-24 DIAGNOSIS — F331 Major depressive disorder, recurrent, moderate: Secondary | ICD-10-CM | POA: Diagnosis not present

## 2024-07-24 MED ORDER — HYDROXYZINE HCL 10 MG PO TABS: 10.0000 mg | ORAL_TABLET | Freq: Two times a day (BID) | ORAL | 2 refills | Status: AC | PRN

## 2024-07-24 MED ORDER — VENLAFAXINE HCL ER 37.5 MG PO CP24: ORAL_CAPSULE | ORAL | 0 refills | Status: DC

## 2024-08-05 ENCOUNTER — Ambulatory Visit (HOSPITAL_COMMUNITY): Admitting: Clinical

## 2024-08-05 DIAGNOSIS — F411 Generalized anxiety disorder: Secondary | ICD-10-CM

## 2024-08-05 DIAGNOSIS — F331 Major depressive disorder, recurrent, moderate: Secondary | ICD-10-CM
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
## 2024-08-06 ENCOUNTER — Encounter (HOSPITAL_COMMUNITY): Payer: Self-pay | Admitting: Clinical

## 2024-08-10 ENCOUNTER — Other Ambulatory Visit (HOSPITAL_COMMUNITY): Payer: Self-pay

## 2024-08-10 ENCOUNTER — Ambulatory Visit
Admission: RE | Admit: 2024-08-10 | Discharge: 2024-08-10 | Disposition: A | Source: Ambulatory Visit | Attending: Family Medicine

## 2024-08-10 DIAGNOSIS — F331 Major depressive disorder, recurrent, moderate: Secondary | ICD-10-CM

## 2024-08-10 DIAGNOSIS — F411 Generalized anxiety disorder: Secondary | ICD-10-CM

## 2024-08-10 MED ORDER — VENLAFAXINE HCL ER 75 MG PO CP24: 75.0000 mg | ORAL_CAPSULE | Freq: Every day | ORAL | 0 refills | Status: DC
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
## 2024-08-13 ENCOUNTER — Encounter (HOSPITAL_COMMUNITY): Payer: Self-pay | Admitting: Psychiatry

## 2024-08-13 ENCOUNTER — Telehealth (HOSPITAL_COMMUNITY): Admitting: Psychiatry

## 2024-08-13 DIAGNOSIS — F411 Generalized anxiety disorder: Secondary | ICD-10-CM | POA: Diagnosis not present

## 2024-08-13 DIAGNOSIS — F329 Major depressive disorder, single episode, unspecified: Secondary | ICD-10-CM | POA: Diagnosis not present

## 2024-08-13 DIAGNOSIS — F331 Major depressive disorder, recurrent, moderate: Secondary | ICD-10-CM

## 2024-08-13 MED ORDER — VENLAFAXINE HCL ER 75 MG PO CP24: 75.0000 mg | ORAL_CAPSULE | Freq: Every day | ORAL | 0 refills | Status: DC

## 2024-08-19 ENCOUNTER — Encounter (HOSPITAL_COMMUNITY): Payer: Self-pay | Admitting: Clinical

## 2024-08-19 ENCOUNTER — Ambulatory Visit (HOSPITAL_COMMUNITY): Admitting: Clinical

## 2024-08-19 DIAGNOSIS — F331 Major depressive disorder, recurrent, moderate: Secondary | ICD-10-CM | POA: Diagnosis not present

## 2024-08-19 DIAGNOSIS — F411 Generalized anxiety disorder: Secondary | ICD-10-CM

## 2024-08-19 NOTE — Progress Notes (Unsigned)
 THERAPIST PROGRESS NOTE  Session Time: 1:00pm-2:00pm   Session #26  Participation Level: Active  Behavioral Response: Casual Alert Negative and Irritable  Type of Therapy: Individual Therapy  Goals addressed:  STG: Learn a variety of coping skills and demonstrate the ability to use them to decrease feelings of sadness, anger, and fear and increase feelings of happiness, peace, and powerfulness AEB gauging those emotions on 1-10 scale. LTG: Learn about boundary types, how to implement them, and how to enforce them so that Jariya feels more empowered and content with being able to control her own life where/when possible. STG: Score less than 9 on the PHQ-9 and less than 5 on the GAD-7 as evidenced by intermittent administration of the questionnaires to determine progress in managing depression and anxiety. LTG: Improve self-esteem by engaging in daily affirmations, developing new skills, gratitude journaling, use of SMART goals, increased assertiveness, challenging negative beliefs, and focusing on what patient can control  LTG: Work to arts development officer from models like CBT, Stages of Change, DBT, shame resilience theory, ACT, SFBT, MI, trauma-informed therapy and others to be able to manage mental health symptoms, AEB practicing out of session and reporting back.  Learn and practice communication techniques such as active listening, I statements, open-ended questions, reflective listening, assertiveness, fair fighting rules, initiating conversations, and more as necessary and taught in session. LTG: Explore personal core beliefs, rules and assumptions, and cognitive distortions through therapist using Cognitive Behavioral Therapy; learn about replacement thoughts, Behavioral Activation and Acting As If AEB using 2 times weekly.  Process life events to the extent needed so that will be able to move forward with various areas of life in a better frame of mind per self-report of improved  satisfaction with life 5 out of 7 days over the next 6 months. LTG: Learn breathing techniques and grounding techniques at an age-appropriate level and demonstrate mastery in session then report independent use of these skills out of session.  ProgressTowards Goals: Progressing  Interventions: Supportive and Social Skills Training   Summary: KELCIE CURRIE is a 63 y.o. female who presents with anxiety and depression for therapy.  She presented oriented x5 and stated she was feeling sleepy.  CSW evaluated patient's medication compliance, use of coping tools, and self-care, as applicable.  SHe provided an update on various aspects of her life that are normally discussed in therapy, including her partner's start of cancer treatment this week including the fact that he was currently across the street in chemotherapy for the first time.  She stated he will have radiation 5 days a week and chemotherapy 1 day a week for 7 weeks.  Although it is early in his treatment, she is perturbed that his children still do not seem to understand the new limits he will have during this process because they keep asking him to do things for them.  She disclosed that after they visited yesterday, she cried and had to take medicine to calm down.  She kept beginning sentence with They should know which CSW tried to gently address.  She has maintained the same boundaries about not having Thanksgiving at their house, was provided positive strokes for this.  She reported that his doctor told them earlier today that things are only going to get worse in terms of how he feels.  She remains upset that a new family text thread has been started that excludes her, and when she has sent updates this week in the old thread, his children's responses  have all been in the new thread, which she knows because she has her partner's phone.  We explored possible responses to this, including not giving updates at all, adding herself to the new  thread, and ignoring the new thread.  She decided to keep doing updates in the old thread, then not looking for the new thread on his phone.  In fact, CSW encouraged her to let her partner keep his own phone or else leave it in the car.  CSW asked her what she feels her responsibilities in life are at this time and she responded with 4: 1-taking care of him 2-taking care of granddaughter on designated days 3-making sure his kids are informed 4-taking care of the house This list of responsibilities was used to encourage her to focus on the important things instead of the petty.  Suicidal/Homicidal: No without intent/plan  Therapist Response:  Patient is progressing AEB engaging in scheduled therapy session.  Throughout the session, CSW gave patient the opportunity to explore thoughts and feelings associated with current life situations and past/present stressors.   CSW challenged patient gently and appropriately to consider different ways of looking at reported issues. CSW encouraged patient's expression of feelings and validated these using empathy, active listening, open body language, and unconditional positive regard.      Plan/Recommendations:  Return to therapy in 2 weeks to next scheduled appointment on 12/3, reflect on what was discussed in session, engage in self care behaviors as explored in session, do homework as assigned (continue to work to change wording from Why to Help me understand, start to take responsibility for only herself and not everybody else), and return to next session prepared to talk about experience with new coping methods.   Diagnosis:  GAD (generalized anxiety disorder)  Moderate episode of recurrent major depressive disorder (HCC)  Collaboration of Care: Psychiatrist AEB -  psychiatrist can read therapy notes; therapist can and does read psychiatric notes prior to sessions   Patient/Guardian was advised Release of Information must be obtained prior to any  record release in order to collaborate their care with an outside provider. Patient/Guardian was advised if they have not already done so to contact the registration department to sign all necessary forms in order for us  to release information regarding their care.   Consent: Patient/Guardian gives verbal consent for treatment and assignment of benefits for services provided during this visit. Patient/Guardian expressed understanding and agreed to proceed.   Elgie JINNY Crest, LCSW 08/19/2024

## 2024-08-24 ENCOUNTER — Inpatient Hospital Stay: Attending: Licensed Clinical Social Worker | Admitting: Licensed Clinical Social Worker

## 2024-08-24 NOTE — Progress Notes (Signed)
 [facility] Healthcare Advance Directives Clinical Social Work  Patient presented to Advance Directives Clinic  to review and complete healthcare advance directives.  Clinical Social Worker met with patient and significant other.  The patient designated Evalene Finder (significant other) as their primary healthcare agent and John Strandberg Jr (son) as their secondary agent.  Patient also completed healthcare living will.    Documents were notarized and copies made for patient/family. Clinical Social Worker will send documents to medical records to be scanned into patient's chart. Clinical Social Worker encouraged patient/family to contact with any additional questions or concerns.   Deniz Hannan E Lakeisha Waldrop, LCSW Clinical Social Worker [facility]

## 2024-09-02 ENCOUNTER — Ambulatory Visit (HOSPITAL_COMMUNITY): Admitting: Clinical

## 2024-09-02 DIAGNOSIS — F411 Generalized anxiety disorder: Secondary | ICD-10-CM

## 2024-09-02 DIAGNOSIS — F331 Major depressive disorder, recurrent, moderate: Secondary | ICD-10-CM | POA: Diagnosis not present

## 2024-09-02 NOTE — Progress Notes (Unsigned)
 THERAPIST PROGRESS NOTE  Session Time: 1:00pm-2:00pm   Session #27  Participation Level: Active  Behavioral Response: Casual Alert Anxious and Hopeless  Type of Therapy: Individual Therapy  Goals addressed:  STG: Learn a variety of coping skills and demonstrate the ability to use them to decrease feelings of sadness, anger, and fear and increase feelings of happiness, peace, and powerfulness AEB gauging those emotions on 1-10 scale. LTG: Learn about boundary types, how to implement them, and how to enforce them so that Julia [name] feels more empowered and content with being able to control her own life where/when possible. STG: Score less than 9 on the PHQ-9 and less than 5 on the GAD-7 as evidenced by intermittent administration of the questionnaires to determine progress in managing depression and anxiety. LTG: Improve self-esteem by engaging in daily affirmations, developing new skills, gratitude journaling, use of SMART goals, increased assertiveness, challenging negative beliefs, and focusing on what patient can control  LTG: Work to arts development officer from models like CBT, Stages of Change, DBT, shame resilience theory, ACT, SFBT, MI, trauma-informed therapy and others to be able to manage mental health symptoms, AEB practicing out of session and reporting back.  Learn and practice communication techniques such as active listening, I statements, open-ended questions, reflective listening, assertiveness, fair fighting rules, initiating conversations, and more as necessary and taught in session. LTG: Explore personal core beliefs, rules and assumptions, and cognitive distortions through therapist using Cognitive Behavioral Therapy; learn about replacement thoughts, Behavioral Activation and Acting As If AEB using 2 times weekly.  Process life events to the extent needed so that will be able to move forward with various areas of life in a better frame of mind per self-report of improved  satisfaction with life 5 out of 7 days over the next 6 months. LTG: Learn breathing techniques and grounding techniques at an age-appropriate level and demonstrate mastery in session then report independent use of these skills out of session.  ProgressTowards Goals: Progressing  Interventions: Assertiveness Training and Supportive   Summary: Julia [name] is a 63 y.o. female who presents with anxiety and depression for therapy.  She presented oriented x5 and stated she was feeling sleepy.  CSW evaluated patient's medication compliance, use of coping tools, and self-care, as applicable.  SHe provided an update on various aspects of her life that are normally discussed in therapy, including her husband's ongoing experiences with cancer treatments, his children's ongoing requests that he do things for them and his acquiesence, Thanksgiving events, issues with son's mother-in-law, and her feelings about not being included as part of husband's circle of family and friends.  She spent Thanksgiving with her children while he spent it with his children, although there was no animosity involved with this, other than her feeling bad that her children were not invited to her step-children's home for Thanksgiving, so she refused to go as well.  She still gets upset about his children asking him to do things, thinks they still do not understand how sick he is, but CSW offered for her to think about the likelihood that as treatment progresses, their understand will progress as well.  She has been feeling depressed and down recently, stating that she feels bad about her husband having cancer, but she also feels the only thing he needs from her is for her to take care of him.  She was angered last night that he posted in Facebook about his cancer after she asked him  not to do so,  saying that just brought more attention and more people he has to talk to, which takes him away from her even more.  With that amount of  attention, she is tempted to be sarcastic with him and say, You're not getting enough attention, already?  CSW helped her to take a look at how she might be responding differently now than she would have before entering therapy.  She is saying No to more things and is feeling good about herself when she does this, instead of guilty.  When she shared problems with son's mother-in-law, CSW reminded her that she has often lamented that this individual gets invited on a lot of their trips and she herself does not, but from this description of newest events, it sounds like things are not always rosy on those vacations.  In fact, it sounded more like these invitations are from obligation and mother needing care, rather than from desire and enjoyment.  She liked looking at it that way.  CSW provided positive strokes for progress in thinking from different perspectives.  Suicidal/Homicidal: No without intent/plan  Therapist Response:  Patient is progressing AEB engaging in scheduled therapy session.  Throughout the session, CSW gave patient the opportunity to explore thoughts and feelings associated with current life situations and past/present stressors.   CSW challenged patient gently and appropriately to consider different ways of looking at reported issues. CSW encouraged patient's expression of feelings and validated these using empathy, active listening, open body language, and unconditional positive regard.      Plan/Recommendations:  Return to therapy in 2 weeks to next scheduled appointment on 12/17, reflect on what was discussed in session, engage in self care behaviors as explored in session, do homework as assigned (continue to take responsibility for only herself and not everybody else), and return to next session prepared to talk about experience with new coping methods.   Diagnosis:  Moderate episode of recurrent major depressive disorder (HCC)  GAD (generalized anxiety  disorder)  Collaboration of Care: Psychiatrist AEB -  psychiatrist can read therapy notes; therapist can and does read psychiatric notes prior to sessions   Patient/Guardian was advised Release of Information must be obtained prior to any record release in order to collaborate their care with an outside provider. Patient/Guardian was advised if they have not already done so to contact the registration department to sign all necessary forms in order for us  to release information regarding their care.   Consent: Patient/Guardian gives verbal consent for treatment and assignment of benefits for services provided during this visit. Patient/Guardian expressed understanding and agreed to proceed.   Julia JINNY Crest, LCSW 09/03/2024

## 2024-09-03 ENCOUNTER — Encounter (HOSPITAL_COMMUNITY): Payer: Self-pay | Admitting: Clinical

## 2024-09-07 ENCOUNTER — Other Ambulatory Visit: Payer: Self-pay | Admitting: Family Medicine

## 2024-09-07 DIAGNOSIS — M1990 Unspecified osteoarthritis, unspecified site: Secondary | ICD-10-CM

## 2024-09-16 ENCOUNTER — Ambulatory Visit (HOSPITAL_COMMUNITY): Admitting: Clinical

## 2024-09-16 DIAGNOSIS — F411 Generalized anxiety disorder: Secondary | ICD-10-CM | POA: Diagnosis not present

## 2024-09-16 DIAGNOSIS — F331 Major depressive disorder, recurrent, moderate: Secondary | ICD-10-CM

## 2024-09-16 NOTE — Progress Notes (Unsigned)
 THERAPIST PROGRESS NOTE  Session Time: 1:00pm-2:00pm   Session #27  Participation Level: Active  Behavioral Response: Casual Alert Anxious and Hopeless  Type of Therapy: Individual Therapy  Goals addressed:  STG: Learn a variety of coping skills and demonstrate the ability to use them to decrease feelings of sadness, anger, and fear and increase feelings of happiness, peace, and powerfulness AEB gauging those emotions on 1-10 scale. LTG: Learn about boundary types, how to implement them, and how to enforce them so that Julia [name] feels more empowered and content with being able to control her own life where/when possible. STG: Score less than 9 on the PHQ-9 and less than 5 on the GAD-7 as evidenced by intermittent administration of the questionnaires to determine progress in managing depression and anxiety. LTG: Improve self-esteem by engaging in daily affirmations, developing new skills, gratitude journaling, use of SMART goals, increased assertiveness, challenging negative beliefs, and focusing on what patient can control  LTG: Work to arts development officer from models like CBT, Stages of Change, DBT, shame resilience theory, ACT, SFBT, MI, trauma-informed therapy and others to be able to manage mental health symptoms, AEB practicing out of session and reporting back.  Learn and practice communication techniques such as active listening, I statements, open-ended questions, reflective listening, assertiveness, fair fighting rules, initiating conversations, and more as necessary and taught in session. LTG: Explore personal core beliefs, rules and assumptions, and cognitive distortions through therapist using Cognitive Behavioral Therapy; learn about replacement thoughts, Behavioral Activation and Acting As If AEB using 2 times weekly.  Process life events to the extent needed so that will be able to move forward with various areas of life in a better frame of mind per self-report of improved  satisfaction with life 5 out of 7 days over the next 6 months. LTG: Learn breathing techniques and grounding techniques at an age-appropriate level and demonstrate mastery in session then report independent use of these skills out of session.  ProgressTowards Goals: Progressing  Interventions: Assertiveness Training and Supportive   Summary: Julia [name] is a 63 y.o. female who presents with anxiety and depression for therapy.  She presented oriented x5 and stated she was feeling sleepy.  CSW evaluated patient's medication compliance, use of coping tools, and self-care, as applicable.  SHe provided an update on various aspects of her life that are normally discussed in therapy, including her husband's ongoing experiences with cancer treatments, his children's ongoing requests that he do things for them and his acquiesence, Thanksgiving events, issues with son's mother-in-law, and her feelings about not being included as part of husband's circle of family and friends.  She spent Thanksgiving with her children while he spent it with his children, although there was no animosity involved with this, other than her feeling bad that her children were not invited to her step-children's home for Thanksgiving, so she refused to go as well.  She still gets upset about his children asking him to do things, thinks they still do not understand how sick he is, but CSW offered for her to think about the likelihood that as treatment progresses, their understand will progress as well.  She has been feeling depressed and down recently, stating that she feels bad about her husband having cancer, but she also feels the only thing he needs from her is for her to take care of him.  She was angered last night that he posted in Facebook about his cancer after she asked him  not to do so,  saying that just brought more attention and more people he has to talk to, which takes him away from her even more.  With that amount of  attention, she is tempted to be sarcastic with him and say, You're not getting enough attention, already?  CSW helped her to take a look at how she might be responding differently now than she would have before entering therapy.  She is saying No to more things and is feeling good about herself when she does this, instead of guilty.  When she shared problems with son's mother-in-law, CSW reminded her that she has often lamented that this individual gets invited on a lot of their trips and she herself does not, but from this description of newest events, it sounds like things are not always rosy on those vacations.  In fact, it sounded more like these invitations are from obligation and mother needing care, rather than from desire and enjoyment.  She liked looking at it that way.  CSW provided positive strokes for progress in thinking from different perspectives.  Suicidal/Homicidal: No without intent/plan  Therapist Response:  Patient is progressing AEB engaging in scheduled therapy session.  Throughout the session, CSW gave patient the opportunity to explore thoughts and feelings associated with current life situations and past/present stressors.   CSW challenged patient gently and appropriately to consider different ways of looking at reported issues. CSW encouraged patients expression of feelings and validated these using empathy, active listening, open body language, and unconditional positive regard.      Plan/Recommendations:  Return to therapy in 2 weeks to next scheduled appointment on 12/17, reflect on what was discussed in session, engage in self care behaviors as explored in session, do homework as assigned (continue to take responsibility for only herself and not everybody else), and return to next session prepared to talk about experience with new coping methods.   Diagnosis:  No diagnosis found.  Collaboration of Care: Psychiatrist AEB -  psychiatrist can read therapy notes; therapist  can and does read psychiatric notes prior to sessions   Patient/Guardian was advised Release of Information must be obtained prior to any record release in order to collaborate their care with an outside provider. Patient/Guardian was advised if they have not already done so to contact the registration department to sign all necessary forms in order for us  to release information regarding their care.   Consent: Patient/Guardian gives verbal consent for treatment and assignment of benefits for services provided during this visit. Patient/Guardian expressed understanding and agreed to proceed.   Elgie JINNY Crest, LCSW 09/16/2024

## 2024-09-17 DIAGNOSIS — H47323 Drusen of optic disc, bilateral: Secondary | ICD-10-CM | POA: Diagnosis not present

## 2024-09-17 DIAGNOSIS — H5203 Hypermetropia, bilateral: Secondary | ICD-10-CM | POA: Diagnosis not present

## 2024-09-19 ENCOUNTER — Encounter (HOSPITAL_COMMUNITY): Payer: Self-pay | Admitting: Clinical

## 2024-10-09 ENCOUNTER — Other Ambulatory Visit: Payer: Self-pay | Admitting: Family Medicine

## 2024-10-09 DIAGNOSIS — G629 Polyneuropathy, unspecified: Secondary | ICD-10-CM

## 2024-10-09 DIAGNOSIS — M1990 Unspecified osteoarthritis, unspecified site: Secondary | ICD-10-CM

## 2024-10-12 NOTE — Progress Notes (Unsigned)
 BH MD/PA/NP OP Progress Note  10/15/2024 11:03 AM Julia [name]  MRN:  [id number]  Visit Diagnosis:    ICD-10-CM   1. GAD (generalized anxiety disorder)  F41.1 buPROPion  (WELLBUTRIN  XL) 300 MG 24 hr tablet    traZODone  (DESYREL ) 50 MG tablet    2. Moderate episode of recurrent major depressive disorder (HCC)  F33.1 buPROPion  (WELLBUTRIN  XL) 300 MG 24 hr tablet    traZODone  (DESYREL ) 50 MG tablet      Assessment: Julia [name] is a 64 y.o. female with a history of anxiety, depression, Vit D deficiency on replacement therapy,  who presented to [facility] at [facility] for initial evaluation on 08/02/2022.  During initial evaluation patient reported neurovegetative symptoms of depression including low mood, anhedonia, amotivation, worthlessness, irritability, changes in sleep, decreased appetite, fatigue, and intermittent passive SI. She denied any intent or plan to act on it.  Safety planning and crisis resources were discussed.  Patient also endorsed symptoms of anxiety including increased worry, difficulty relaxing, restlessness, racing thoughts, and inability to control worry.  Of note patient endorsed significant pain symptoms due to osteoarthritis. Psychosocially patient reports difficult relationship with her fianc, significant past trauma from her mother and ex-husband, and limited social supports.  Patient met criteria for MDD and generalized anxiety disorder.  Julia [name] presents for follow-up evaluation. Today, 10/15/24, patient still experiencing residual anxiety symptoms though they are stable compared to previous visit. Current symptoms are largely related to the interpersonal stress with her partner and his recovery after completing cancer treatment last week. Patient is taking the medication consistently and denies adverse side effects. She has found it helpful in controlling intermittent anxiety and keeping moods more stable.  We will continue on her  current regimen and assess whether further titration is needed at next appointment.  Follow-up in a months.  Psychotherapeutic interventions were used during today's session. From 10:36 AM to 10:59 AM. Therapeutic interventions included empathic listening, supportive therapy, cognitive and behavioral therapy, motivational interviewing. Used supportive interviewing techniques to provide emotional validation. Worked on cognitive reframing techniques and unhelpful thoughts challenged as appropriate. Reviewed the importance of setting boundaries and how to implement them. Alternative thoughts developed with guidance. Improvement was evidenced by patient's participation and identified commitment to therapy goals.   Plan: - Discontinued Cymbalta  - Continue venlafaxine  75 mg daily - Continue Wellbutrin  XL 300 mg every day - Continue Trazodone  25 mg QHS prn for insomnia - Continue hydroxyzine  25 mg twice a day as needed for anxiety - Continue gabapentin  300 mg BID managed by pcp, found 600 mg QHS to be oversedating with the trazodone  - CMP, CBC, lipid profile, anemia panel, Vit D, TSH, A1c reviewed - Continue therapy with [name] biweekly - Can consider partial or IOP in the future if needed - Crisis resources reviewed - Follow up in 4 weeks  Chief Complaint:  Chief Complaint  Patient presents with   Follow-up   HPI: Her partner finished his chemo and radiation last week and now he is home recovering. He could not talk yesterday and is eating through a feeding tube. He will have to eat on his own for 30 days before he can have the tube removed. Her partner can not return to work until the feeding tube is removed.  Julia [name] is frustrated with his kids. She does not feel that they were present for the treatment and frequently asked him to do things during the chemotherapy. They all wanted to be  present at the cancer center when he rang the bell after finishing treatment. She had anxiety that  she would be pushed to the side by his daily during this time and did bring her prn anxiety medication. Ultimately it had gone alright and she was able to stay with her partner when he rang the bell.  Multiple times of frustration that her partner does not listen to her giving examples with his kids and with a humidifier recently. While he was trying to help her with the humidifier and putting her first, Julia [name] was frustrated that he did it after she asked him not too.  Another area of frustration is with her partners energy levels. When his kids ask for something he can go out and work on projects. However the moment they are gone he collapses on the couch. It has still been up to her to take care of all the chores around the house. Julia [name] thinks he should rest and at the same time is frustrated that he is helping his kids and not her. It feels that the only one he says no to is her. Empathic listening techniques were used and support was provided. Reviewed the importance of boundaries and how to implement these.  The hydroxyzine  has been helpful for anxiety as needed. She thinks the venlafaxine  is working and she is not blowing up like she used to. She believes that her current symptoms are related to this being more stressful.   Past Psychiatric History: Patient was connected with [facility] psychiatry in 2015 where she had a therapist and a provider.  She went to a partial hospitalization program for several months in 2016.  Patient denies any suicide attempts or psychiatric hospitalizations.  She has tried Zoloft , Wellbutrin , and Lamictal in the past.  Trazodone  at [facility] Patient believes she might have tried more but was unable to remember any.  Denies any substance use.  Past Medical History:  Past Medical History:  Diagnosis Date   Anxiety 2015   Arthritis 2013   Complication of anesthesia    slow to wake up-does not take much medicine to sedate her   Depression 2015   Esophageal reflux     Hyperlipidemia    Low ferritin 02/10/2021   Paget disease of bone    Pneumonia due to COVID-19 virus 05/20/2020   Vitamin B12 deficiency 02/10/2021   Vitamin D  insufficiency 02/10/2021   Wears glasses     Past Surgical History:  Procedure Laterality Date   ANTERIOR FUSION CERVICAL SPINE  10/2019   C3-C5   BREAST CYST EXCISION Left    BREAST SURGERY  2000   Small cyst removed   CERVICAL SPINE SURGERY  10/2011   CESAREAN SECTION  1983   Also 1988 1989   DILATION AND CURETTAGE OF UTERUS  1986   JOINT REPLACEMENT  Jan 2025   Total knee replacement   KNEE ARTHROSCOPY Left 07/22/2013   Procedure: LEFT KNEE ARTHROSCOPY WITH DEBRIDEMENT CHONDROPLASTY, REMOVAL LOOSE BODIES, PARTIAL MEDIAL MENISCECTOMY, OPEN EXCISION OF PES GANGLION;  Surgeon: Dempsey JINNY Sensor, MD;  Location: [facility];  Service: Orthopedics;  Laterality: Left;  NO SPECIMEN SENT PER DR. SENSOR ORDER.   KNEE SURGERY Left 11/21/2021   REPLACEMENT TOTAL KNEE Left 10/15/2023   SPINE SURGERY  10/26/2019   Also 2012   TOTAL ABDOMINAL HYSTERECTOMY  05/2010   TAH   TUBAL LIGATION  1989    Family History:  Family History  Problem Relation Age of Onset  Thyroid  disease Mother    Congestive Heart Failure Mother    Heart disease Mother    Arthritis Mother    Hyperlipidemia Mother    Miscarriages / Stillbirths Mother    Heart disease Father    Hyperlipidemia Father    Lung cancer Sister    Melanoma Sister    Cancer Sister    Depression Sister    Anxiety disorder Sister    Arthritis Sister    Hyperlipidemia Sister    Varicose Veins Sister    Breast cancer Maternal Aunt    Alcohol abuse Brother    Alcohol abuse Brother    Alcohol abuse Brother    Healthy Brother     Social History:  Social History   Socioeconomic History   Marital status: Significant Other    Spouse name: Evalene Finder   Number of children: 3   Years of education: Not on file   Highest education level: 12th grade  Occupational  History   Occupation: disability  Tobacco Use   Smoking status: Former    Current packs/day: 0.00    Average packs/day: 0.2 packs/day for 6.8 years (1.2 ttl pk-yrs)    Types: Cigarettes    Start date: 01/03/1980    Quit date: 01/02/1985    Years since quitting: 39.8   Smokeless tobacco: Never   Tobacco comments:    Was a light smoker a pack would last a few days  Vaping Use   Vaping status: Never Used  Substance and Sexual Activity   Alcohol use: Not Currently    Alcohol/week: 1.0 standard drink of alcohol    Types: 1 Glasses of wine per week    Comment: occassional   Drug use: No   Sexual activity: Not Currently    Birth control/protection: Abstinence  Other Topics Concern   Not on file  Social History Narrative   2 sons live nearby, one son in [place]     05/09/2022 - Has 3 grandchildren - one almost a year old   Social Drivers of Health   Tobacco Use: Medium Risk (10/15/2024)   Patient History    Smoking Tobacco Use: Former    Smokeless Tobacco Use: Never    Passive Exposure: Not on Actuary Strain: Low Risk (05/12/2024)   Overall Financial Resource Strain (CARDIA)    Difficulty of Paying Living Expenses: Not very hard  Food Insecurity: No Food Insecurity (05/12/2024)   Epic    Worried About Radiation Protection Practitioner of Food in the Last Year: Never true    Ran Out of Food in the Last Year: Never true  Transportation Needs: No Transportation Needs (05/12/2024)   Epic    Lack of Transportation (Medical): No    Lack of Transportation (Non-Medical): No  Physical Activity: Unknown (05/12/2024)   Exercise Vital Sign    Days of Exercise per Week: Patient declined    Minutes of Exercise per Session: Not on file  Stress: Stress Concern Present (05/12/2024)   [employer] of Occupational Health - Occupational Stress Questionnaire    Feeling of Stress: Very much  Social Connections: Socially Isolated (05/12/2024)   Social Connection and Isolation Panel    Frequency of  Communication with Friends and Family: Once a week    Frequency of Social Gatherings with Friends and Family: Once a week    Attends Religious Services: Patient declined    Database Administrator or Organizations: No    Attends Banker Meetings: Not on file  Marital Status: Divorced  Depression (PHQ2-9): High Risk (05/13/2024)   Depression (PHQ2-9)    PHQ-2 Score: 15  Alcohol Screen: Low Risk (05/13/2024)   Alcohol Screen    Last Alcohol Screening Score (AUDIT): 0  Housing: Low Risk (05/12/2024)   Epic    Unable to Pay for Housing in the Last Year: No    Number of Times Moved in the Last Year: 0    Homeless in the Last Year: No  Utilities: Not At Risk (05/13/2024)   Epic    Threatened with loss of utilities: No  Health Literacy: Adequate Health Literacy (05/13/2024)   B1300 Health Literacy    Frequency of need for help with medical instructions: Never    Allergies:  Allergies  Allergen Reactions   Lamotrigine Itching and Swelling    Current Medications: Current Outpatient Medications  Medication Sig Dispense Refill   atorvastatin  (LIPITOR) 40 MG tablet Take 1 tablet (40 mg total) by mouth every evening. 90 tablet 1   buPROPion  (WELLBUTRIN  XL) 300 MG 24 hr tablet Take 1 tablet (300 mg total) by mouth daily. 90 tablet 0   Cholecalciferol (VITAMIN D3) 50 MCG (2000 UT) capsule Take 2,000 Units by mouth daily.     gabapentin  (NEURONTIN ) 300 MG capsule Take 1 capsule (300 mg total) by mouth 2 (two) times daily. **NEEDS TO BE SEEN BEFORE NEXT REFILL** 60 capsule 0   hydrOXYzine  (ATARAX ) 10 MG tablet Take 1 tablet (10 mg total) by mouth 2 (two) times daily as needed. 60 tablet 2   loratadine  (CLARITIN ) 10 MG tablet Take 1 tablet (10 mg total) by mouth daily. 30 tablet 11   meloxicam  (MOBIC ) 15 MG tablet TAKE ONE TABLET ONCE DAILY (NEED TO BE SEEN BEFORE NEXT REFILL) 30 tablet 0   traZODone  (DESYREL ) 50 MG tablet Take 0.5 tablets (25 mg total) by mouth at bedtime. 45 tablet  1   venlafaxine  XR (EFFEXOR  XR) 75 MG 24 hr capsule Take 1 capsule (75 mg total) by mouth daily with breakfast. 90 capsule 0   No current facility-administered medications for this visit.     Psychiatric Specialty Exam: Review of Systems  Last menstrual period 01/02/2010.There is no height or weight on file to calculate BMI.  General Appearance: Fairly Groomed  Eye Contact:  Good  Speech:  Clear and Coherent  Volume:  Normal  Mood:  Euthymic  Affect:  Congruent  Thought Process:  Coherent  Orientation:  Full (Time, Place, and Person)  Thought Content: Logical and Rumination   Suicidal Thoughts:  No  Homicidal Thoughts:  No  Memory:  NA  Judgement:  Fair  Insight:  Fair  Psychomotor Activity:  Normal  Concentration:  Concentration: Fair  Recall:  Good  Fund of Knowledge: Fair  Language: Good  Akathisia:  NA    AIMS (if indicated): not done  Assets:  Communication Skills Desire for Improvement Housing  ADL's:  Intact  Cognition: WNL  Sleep:  Fair   Metabolic Disorder Labs: Lab Results  Component Value Date   HGBA1C 5.5 05/13/2024   MPG 136.98 05/29/2020   MPG 111 07/06/2015   No results found for: PROLACTIN Lab Results  Component Value Date   CHOL 158 05/13/2024   TRIG 70 05/13/2024   HDL 65 05/13/2024   CHOLHDL 2.4 05/13/2024   VLDL 19 12/05/2018   LDLCALC 79 05/13/2024   LDLCALC 98 09/09/2023   Lab Results  Component Value Date   TSH 1.490 05/13/2024   TSH 1.880 04/25/2023  Therapeutic Level Labs: No results found for: LITHIUM No results found for: VALPROATE No results found for: CBMZ   Screenings: GAD-7    Flowsheet Row Counselor from 09/23/2023 in [facility] at [facility] Visit from 09/12/2023 in [facility] Visit from 08/07/2023 in [facility] Office Visit from 04/25/2023 in [facility]  Counselor from 11/23/2022 in [facility] at [facility]  Total GAD-7 Score 20 17 17 20 17    Mini-Mental    Flowsheet Row Office Visit from 05/03/2020 in [facility]  Total Score (max 30 points ) 29   PHQ2-9    Flowsheet Row Clinical Support from 05/13/2024 in [facility] Counselor from 09/23/2023 in [facility] at [facility] Visit from 09/12/2023 in [facility] Office Visit from 08/07/2023 in [facility] Clinical Support from 05/13/2023 in [facility]  PHQ-2 Total Score 6 6 5 4 2   PHQ-9 Total Score 15 21 20 19 15    Flowsheet Row Counselor from 11/23/2022 in [facility] at [facility] Visit from 08/02/2022 in [facility] Clinical Support from 05/09/2022 in [facility]  C-SSRS RISK CATEGORY No Risk Low Risk No Risk    Collaboration of Care: Collaboration of Care: Medication Management AEB medication prescription and Referral or follow-up with counselor/therapist AEB chart review  Patient/Guardian was advised Release of Information must be obtained prior to any record release in order to collaborate their care with an outside provider. Patient/Guardian was advised if they have not already done so to contact the registration department to sign all necessary forms in order for us  to release information regarding their care.   Consent: Patient/Guardian gives verbal consent for treatment and assignment of benefits for services provided during this visit. Patient/Guardian expressed understanding and agreed to proceed.    Arvella CHRISTELLA Finder, MD 10/15/2024, 11:03 AM  Virtual Visit via Video Note  I connected with Julia [name] on 10/15/24 at 10:30 AM EST by a video  enabled telemedicine application and verified that I am speaking with the correct person using two identifiers.  Location: Patient: Home Provider: Home Office   I discussed the limitations of evaluation and management by telemedicine and the availability of in person appointments. The patient expressed understanding and agreed to proceed.   I discussed the assessment and treatment plan with the patient. The patient was provided an opportunity to ask questions and all were answered. The patient agreed with the plan and demonstrated an understanding of the instructions.   The patient was advised to call back or seek an in-person evaluation if the symptoms worsen or if the condition fails to improve as anticipated.   I provided 35 minutes of non-face-to-face time during this encounter.  Arvella CHRISTELLA Finder, MD

## 2024-10-14 ENCOUNTER — Ambulatory Visit (INDEPENDENT_AMBULATORY_CARE_PROVIDER_SITE_OTHER): Admitting: Clinical

## 2024-10-14 DIAGNOSIS — F331 Major depressive disorder, recurrent, moderate: Secondary | ICD-10-CM

## 2024-10-14 DIAGNOSIS — F411 Generalized anxiety disorder: Secondary | ICD-10-CM

## 2024-10-15 ENCOUNTER — Encounter (HOSPITAL_COMMUNITY): Payer: Self-pay | Admitting: Psychiatry

## 2024-10-15 ENCOUNTER — Encounter (HOSPITAL_COMMUNITY): Payer: Self-pay | Admitting: Clinical

## 2024-10-15 ENCOUNTER — Telehealth (HOSPITAL_COMMUNITY): Admitting: Psychiatry

## 2024-10-15 DIAGNOSIS — F331 Major depressive disorder, recurrent, moderate: Secondary | ICD-10-CM | POA: Diagnosis not present

## 2024-10-15 DIAGNOSIS — F411 Generalized anxiety disorder: Secondary | ICD-10-CM | POA: Diagnosis not present

## 2024-10-15 MED ORDER — BUPROPION HCL ER (XL) 300 MG PO TB24: 300.0000 mg | ORAL_TABLET | Freq: Every day | ORAL | 0 refills | Status: AC

## 2024-10-15 MED ORDER — TRAZODONE HCL 50 MG PO TABS: 25.0000 mg | ORAL_TABLET | Freq: Every day | ORAL | 1 refills | Status: AC

## 2024-10-15 NOTE — Progress Notes (Signed)
 THERAPIST PROGRESS NOTE  Session Time: 10:02am-11:02am   Session #29  Participation Level: Active  Behavioral Response: Casual Alert Euthymic  Type of Therapy: Individual Therapy  Goals addressed:  STG: Learn a variety of coping skills and demonstrate the ability to use them to decrease feelings of sadness, anger, and fear and increase feelings of happiness, peace, and powerfulness AEB gauging those emotions on 1-10 scale. LTG: Learn about boundary types, how to implement them, and how to enforce them so that Amylynn feels more empowered and content with being able to control her own life where/when possible. STG: Score less than 9 on the PHQ-9 and less than 5 on the GAD-7 as evidenced by intermittent administration of the questionnaires to determine progress in managing depression and anxiety. LTG: Improve self-esteem by engaging in daily affirmations, developing new skills, gratitude journaling, use of SMART goals, increased assertiveness, challenging negative beliefs, and focusing on what patient can control  LTG: Work to arts development officer from models like CBT, Stages of Change, DBT, shame resilience theory, ACT, SFBT, MI, trauma-informed therapy and others to be able to manage mental health symptoms, AEB practicing out of session and reporting back.  Learn and practice communication techniques such as active listening, I statements, open-ended questions, reflective listening, assertiveness, fair fighting rules, initiating conversations, and more as necessary and taught in session. LTG: Explore personal core beliefs, rules and assumptions, and cognitive distortions through therapist using Cognitive Behavioral Therapy; learn about replacement thoughts, Behavioral Activation and Acting As If AEB using 2 times weekly.  Process life events to the extent needed so that will be able to move forward with various areas of life in a better frame of mind per self-report of improved satisfaction with  life 5 out of 7 days over the next 6 months. LTG: Learn breathing techniques and grounding techniques at an age-appropriate level and demonstrate mastery in session then report independent use of these skills out of session.  ProgressTowards Goals: Progressing  Interventions: Assertiveness Training and Supportive   Summary: Julia [name] is a 64 y.o. female who presents with anxiety and depression for therapy.  Patient presented more relaxed and cheerful than in recent sessions. She reported that her husband completed chemotherapy last week. She fixated on a disagreement regarding who should attend her husbands ringing of the bell, expressing validation when nursing staff enforced a family-only rule consistent with her view. She reviewed the recent effects of the cancer treatments on him, noting that he continues to require a feeding tube as his oral tissues remain too burned to tolerate oral intake. She described the various supportive tasks she provides, including cleaning the feeding tube and offering practical suggestions such as how to hang the formula bag more effectively.  Patient continued to ruminate on perceived patterns of her husband prioritizing others needs over hers and expressed frustration that she has not received an apology from her stepson for past disrespectful remarks following the cancer diagnosis. She stated she believes her husband will not address the issue with his son due to fear of alienating his children related to past parental absence. CSW attempted cognitive reframing multiple times; patient briefly acknowledged alternative perspectives but quickly returned to the same themes. Patient declined CSWs encouragement to address the stepson directly, citing exclusion from the family text group and feeling not part of the family. CSW provided normalization regarding differences in attention to tasks and suggested practical strategies (e.g., chore list), which patient  acknowledged had been helpful previously.  Suicidal/Homicidal: No  without intent/plan  Therapist Response:  Patient demonstrates improved mood and reduced acute stress following the completion of her husbands treatment. Ongoing cognitive rigidity, perseveration, and relational distress remain prominent, particularly around perceived invalidation and boundary issues within the blended family. Insight is partial; patient can hear alternative viewpoints but struggles to sustain reframed interpretations. Avoidance of direct communication indicates continued difficulty with assertiveness and boundary-setting.    Plan/Recommendations:  Return to therapy at next scheduled appointment on 2/18, reflect on what was discussed in session, engage in self care behaviors as explored in session, do homework as assigned (make a reasonable list for husband, remembering he is still off work for medical reasons), and return to next session prepared to talk about experience with new coping methods. Continue supportive therapy with focus on cognitive flexibility, boundary-setting, and assertive communication skills. Explore readiness and barriers to addressing issues directly with stepson and spouse. Reinforce use of practical coping strategies (e.g., written chore lists) and continue normalization where appropriate  Diagnosis:  GAD (generalized anxiety disorder)  Moderate episode of recurrent major depressive disorder (HCC)  Collaboration of Care: Psychiatrist AEB -  psychiatrist can read therapy notes; therapist can and does read psychiatric notes prior to sessions   Patient/Guardian was advised Release of Information must be obtained prior to any record release in order to collaborate their care with an outside provider. Patient/Guardian was advised if they have not already done so to contact the registration department to sign all necessary forms in order for us  to release information regarding their care.    Consent: Patient/Guardian gives verbal consent for treatment and assignment of benefits for services provided during this visit. Patient/Guardian expressed understanding and agreed to proceed.   Elgie JINNY Crest, LCSW 10/15/2024

## 2024-10-29 ENCOUNTER — Ambulatory Visit (HOSPITAL_COMMUNITY): Admitting: Clinical

## 2024-11-06 ENCOUNTER — Encounter (HOSPITAL_COMMUNITY): Payer: Self-pay | Admitting: Psychiatry

## 2024-11-06 ENCOUNTER — Telehealth (HOSPITAL_COMMUNITY): Admitting: Psychiatry

## 2024-11-06 DIAGNOSIS — F331 Major depressive disorder, recurrent, moderate: Secondary | ICD-10-CM

## 2024-11-06 DIAGNOSIS — F411 Generalized anxiety disorder: Secondary | ICD-10-CM

## 2024-11-06 MED ORDER — VENLAFAXINE HCL ER 75 MG PO CP24: 75.0000 mg | ORAL_CAPSULE | Freq: Every day | ORAL | 0 refills | Status: AC

## 2024-11-18 ENCOUNTER — Ambulatory Visit (HOSPITAL_COMMUNITY): Admitting: Clinical

## 2024-12-04 ENCOUNTER — Telehealth (HOSPITAL_COMMUNITY): Admitting: Psychiatry

## 2025-05-14 ENCOUNTER — Ambulatory Visit: Payer: Self-pay
